# Patient Record
Sex: Female | Born: 1935
Health system: Southern US, Community
[De-identification: ages and names within clinical notes are randomized; demographics above are authoritative.]

## PROBLEM LIST (undated history)

## (undated) DIAGNOSIS — C55 Malignant neoplasm of uterus, part unspecified: Secondary | ICD-10-CM

## (undated) DIAGNOSIS — K117 Disturbances of salivary secretion: Secondary | ICD-10-CM

## (undated) DIAGNOSIS — N390 Urinary tract infection, site not specified: Secondary | ICD-10-CM

## (undated) DIAGNOSIS — Z8619 Personal history of other infectious and parasitic diseases: Secondary | ICD-10-CM

## (undated) DIAGNOSIS — I452 Bifascicular block: Secondary | ICD-10-CM

## (undated) DIAGNOSIS — F09 Unspecified mental disorder due to known physiological condition: Secondary | ICD-10-CM

## (undated) DIAGNOSIS — D696 Thrombocytopenia, unspecified: Secondary | ICD-10-CM

## (undated) DIAGNOSIS — R946 Abnormal results of thyroid function studies: Secondary | ICD-10-CM

## (undated) DIAGNOSIS — R682 Dry mouth, unspecified: Secondary | ICD-10-CM

## (undated) DIAGNOSIS — E875 Hyperkalemia: Secondary | ICD-10-CM

## (undated) DIAGNOSIS — E871 Hypo-osmolality and hyponatremia: Secondary | ICD-10-CM

## (undated) DIAGNOSIS — I2510 Atherosclerotic heart disease of native coronary artery without angina pectoris: Secondary | ICD-10-CM

## (undated) DIAGNOSIS — I10 Essential (primary) hypertension: Secondary | ICD-10-CM

## (undated) DIAGNOSIS — H4900 Third [oculomotor] nerve palsy, unspecified eye: Secondary | ICD-10-CM

## (undated) DIAGNOSIS — F0390 Unspecified dementia without behavioral disturbance: Secondary | ICD-10-CM

## (undated) DIAGNOSIS — K219 Gastro-esophageal reflux disease without esophagitis: Secondary | ICD-10-CM

## (undated) DIAGNOSIS — M1990 Unspecified osteoarthritis, unspecified site: Secondary | ICD-10-CM

## (undated) DIAGNOSIS — J449 Chronic obstructive pulmonary disease, unspecified: Secondary | ICD-10-CM

## (undated) DIAGNOSIS — E785 Hyperlipidemia, unspecified: Secondary | ICD-10-CM

## (undated) DIAGNOSIS — N3946 Mixed incontinence: Secondary | ICD-10-CM

## (undated) DIAGNOSIS — D509 Iron deficiency anemia, unspecified: Secondary | ICD-10-CM

## (undated) DIAGNOSIS — K5909 Other constipation: Secondary | ICD-10-CM

## (undated) HISTORY — DX: Third (oculomotor) nerve palsy, unspecified eye: H49.00

## (undated) HISTORY — PX: DILATION AND CURETTAGE OF UTERUS: SHX78

## (undated) HISTORY — DX: Urinary tract infection, site not specified: N39.0

## (undated) HISTORY — DX: Personal history of other infectious and parasitic diseases: Z86.19

## (undated) HISTORY — DX: Disturbances of salivary secretion: K11.7

## (undated) HISTORY — DX: Iron deficiency anemia, unspecified: D50.9

## (undated) HISTORY — DX: Chronic obstructive pulmonary disease, unspecified: J44.9

## (undated) HISTORY — DX: Thrombocytopenia, unspecified: D69.6

## (undated) HISTORY — DX: Unspecified dementia, unspecified severity, without behavioral disturbance, psychotic disturbance, mood disturbance, and anxiety: F03.90

## (undated) HISTORY — DX: Dry mouth, unspecified: R68.2

## (undated) HISTORY — PX: TRANSTHORACIC ECHOCARDIOGRAM: SHX275

## (undated) HISTORY — DX: Mixed incontinence: N39.46

## (undated) HISTORY — DX: Abnormal results of thyroid function studies: R94.6

## (undated) HISTORY — DX: Malignant neoplasm of uterus, part unspecified: C55

## (undated) HISTORY — PX: REPAIR KNEE LIGAMENT: SUR1188

## (undated) HISTORY — DX: Hyperkalemia: E87.5

## (undated) HISTORY — DX: Other constipation: K59.09

## (undated) HISTORY — DX: Unspecified mental disorder due to known physiological condition: F09

## (undated) HISTORY — PX: HERNIA REPAIR: SHX51

## (undated) HISTORY — PX: TONSILLECTOMY: SUR1361

## (undated) HISTORY — PX: CATARACT EXTRACTION W/ INTRAOCULAR LENS  IMPLANT, BILATERAL: SHX1307

## (undated) HISTORY — DX: Bifascicular block: I45.2

---

## 1989-02-03 HISTORY — PX: CATARACT EXTRACTION W/ INTRAOCULAR LENS  IMPLANT, BILATERAL: SHX1307

## 1995-06-06 HISTORY — PX: ESOPHAGOGASTRODUODENOSCOPY: SHX1529

## 1998-05-18 ENCOUNTER — Other Ambulatory Visit: Admission: RE | Admit: 1998-05-18 | Discharge: 1998-05-18 | Payer: Self-pay | Admitting: Obstetrics and Gynecology

## 1998-06-05 DIAGNOSIS — C55 Malignant neoplasm of uterus, part unspecified: Secondary | ICD-10-CM

## 1998-06-05 HISTORY — PX: APPENDECTOMY: SHX54

## 1998-06-05 HISTORY — PX: ABDOMINAL HYSTERECTOMY: SHX81

## 1998-06-05 HISTORY — DX: Malignant neoplasm of uterus, part unspecified: C55

## 1998-12-28 ENCOUNTER — Encounter (INDEPENDENT_AMBULATORY_CARE_PROVIDER_SITE_OTHER): Payer: Self-pay

## 1998-12-28 ENCOUNTER — Ambulatory Visit (HOSPITAL_COMMUNITY): Admission: RE | Admit: 1998-12-28 | Discharge: 1998-12-28 | Payer: Self-pay | Admitting: Internal Medicine

## 1999-01-14 ENCOUNTER — Encounter: Payer: Self-pay | Admitting: Orthopaedic Surgery

## 1999-01-18 ENCOUNTER — Inpatient Hospital Stay (HOSPITAL_COMMUNITY): Admission: RE | Admit: 1999-01-18 | Discharge: 1999-01-21 | Payer: Self-pay | Admitting: Gynecology

## 1999-01-18 ENCOUNTER — Encounter (INDEPENDENT_AMBULATORY_CARE_PROVIDER_SITE_OTHER): Payer: Self-pay

## 1999-05-05 ENCOUNTER — Ambulatory Visit: Admission: RE | Admit: 1999-05-05 | Discharge: 1999-05-05 | Payer: Self-pay | Admitting: Family Medicine

## 1999-06-20 ENCOUNTER — Other Ambulatory Visit: Admission: RE | Admit: 1999-06-20 | Discharge: 1999-06-20 | Payer: Self-pay | Admitting: Gynecology

## 1999-07-06 ENCOUNTER — Encounter: Admission: RE | Admit: 1999-07-06 | Discharge: 1999-08-11 | Payer: Self-pay | Admitting: Gynecology

## 1999-09-19 ENCOUNTER — Other Ambulatory Visit: Admission: RE | Admit: 1999-09-19 | Discharge: 1999-09-19 | Payer: Self-pay | Admitting: Gynecology

## 1999-11-21 ENCOUNTER — Other Ambulatory Visit: Admission: RE | Admit: 1999-11-21 | Discharge: 1999-11-21 | Payer: Self-pay | Admitting: Gynecology

## 2000-01-26 ENCOUNTER — Other Ambulatory Visit: Admission: RE | Admit: 2000-01-26 | Discharge: 2000-01-26 | Payer: Self-pay | Admitting: Orthopedic Surgery

## 2000-03-08 ENCOUNTER — Other Ambulatory Visit: Admission: RE | Admit: 2000-03-08 | Discharge: 2000-03-08 | Payer: Self-pay | Admitting: Obstetrics and Gynecology

## 2000-09-24 ENCOUNTER — Other Ambulatory Visit: Admission: RE | Admit: 2000-09-24 | Discharge: 2000-09-24 | Payer: Self-pay | Admitting: Obstetrics and Gynecology

## 2001-04-04 ENCOUNTER — Other Ambulatory Visit: Admission: RE | Admit: 2001-04-04 | Discharge: 2001-04-04 | Payer: Self-pay | Admitting: Obstetrics and Gynecology

## 2001-04-05 HISTORY — PX: COLONOSCOPY: SHX174

## 2001-04-11 ENCOUNTER — Ambulatory Visit (HOSPITAL_COMMUNITY): Admission: RE | Admit: 2001-04-11 | Discharge: 2001-04-11 | Payer: Self-pay | Admitting: Gastroenterology

## 2001-09-04 ENCOUNTER — Other Ambulatory Visit: Admission: RE | Admit: 2001-09-04 | Discharge: 2001-09-04 | Payer: Self-pay | Admitting: Gynecology

## 2001-09-26 ENCOUNTER — Ambulatory Visit (HOSPITAL_COMMUNITY): Admission: RE | Admit: 2001-09-26 | Discharge: 2001-09-26 | Payer: Self-pay | Admitting: Cardiology

## 2002-03-27 ENCOUNTER — Other Ambulatory Visit: Admission: RE | Admit: 2002-03-27 | Discharge: 2002-03-27 | Payer: Self-pay | Admitting: Gynecology

## 2002-10-02 ENCOUNTER — Other Ambulatory Visit: Admission: RE | Admit: 2002-10-02 | Discharge: 2002-10-02 | Payer: Self-pay | Admitting: Gynecology

## 2003-04-23 ENCOUNTER — Other Ambulatory Visit: Admission: RE | Admit: 2003-04-23 | Discharge: 2003-04-23 | Payer: Self-pay | Admitting: Gynecology

## 2003-11-12 ENCOUNTER — Other Ambulatory Visit: Admission: RE | Admit: 2003-11-12 | Discharge: 2003-11-12 | Payer: Self-pay | Admitting: Gynecology

## 2004-04-21 ENCOUNTER — Other Ambulatory Visit: Admission: RE | Admit: 2004-04-21 | Discharge: 2004-04-21 | Payer: Self-pay | Admitting: Gynecology

## 2005-05-04 ENCOUNTER — Other Ambulatory Visit: Admission: RE | Admit: 2005-05-04 | Discharge: 2005-05-04 | Payer: Self-pay | Admitting: Gynecology

## 2005-10-05 ENCOUNTER — Other Ambulatory Visit: Admission: RE | Admit: 2005-10-05 | Discharge: 2005-10-05 | Payer: Self-pay | Admitting: Gynecology

## 2005-11-16 ENCOUNTER — Ambulatory Visit (HOSPITAL_BASED_OUTPATIENT_CLINIC_OR_DEPARTMENT_OTHER): Admission: RE | Admit: 2005-11-16 | Discharge: 2005-11-16 | Payer: Self-pay | Admitting: Orthopedic Surgery

## 2005-12-26 ENCOUNTER — Encounter: Payer: Self-pay | Admitting: Vascular Surgery

## 2005-12-26 ENCOUNTER — Ambulatory Visit (HOSPITAL_COMMUNITY): Admission: RE | Admit: 2005-12-26 | Discharge: 2005-12-26 | Payer: Self-pay | Admitting: Orthopedic Surgery

## 2006-04-16 ENCOUNTER — Other Ambulatory Visit: Admission: RE | Admit: 2006-04-16 | Discharge: 2006-04-16 | Payer: Self-pay | Admitting: Gynecology

## 2006-10-11 ENCOUNTER — Other Ambulatory Visit: Admission: RE | Admit: 2006-10-11 | Discharge: 2006-10-11 | Payer: Self-pay | Admitting: Gynecology

## 2007-04-25 ENCOUNTER — Other Ambulatory Visit: Admission: RE | Admit: 2007-04-25 | Discharge: 2007-04-25 | Payer: Self-pay | Admitting: Gynecology

## 2007-06-22 DIAGNOSIS — I2510 Atherosclerotic heart disease of native coronary artery without angina pectoris: Secondary | ICD-10-CM

## 2008-02-12 ENCOUNTER — Inpatient Hospital Stay (HOSPITAL_COMMUNITY): Admission: AD | Admit: 2008-02-12 | Discharge: 2008-02-13 | Payer: Self-pay | Admitting: Cardiology

## 2010-06-26 ENCOUNTER — Encounter: Payer: Self-pay | Admitting: Family Medicine

## 2010-08-16 ENCOUNTER — Other Ambulatory Visit: Payer: Self-pay | Admitting: Gastroenterology

## 2010-08-19 ENCOUNTER — Ambulatory Visit
Admission: RE | Admit: 2010-08-19 | Discharge: 2010-08-19 | Disposition: A | Discharge status: Home / Self Care | Payer: Medicare Other | Source: Ambulatory Visit | Attending: Gastroenterology | Admitting: Gastroenterology

## 2010-10-18 NOTE — Cardiovascular Report (Signed)
[name], Sabrina Alvarez NO.:  [id number]   MEDICAL RECORD NO.:  [id number]          PATIENT TYPE:  INP   LOCATION:  2025                         FACILITY:  [facility]   PHYSICIAN:  Georga Hacking, M.D.DATE OF BIRTH:  [birth year]   DATE OF PROCEDURE:  02/13/2008  DATE OF DISCHARGE:                            CARDIAC CATHETERIZATION   HISTORY:  This is a 77 year old female with known coronary artery  disease who presented with 2-3 days history of substernal chest  pressure.  She had a previous negative Cardiolite study in May, but has  had worsening angina recently or chest pain recently.   PROCEDURE:  Left heart catheterization with coronary angiograms and left  ventriculogram.   PROCEDURE IN DETAIL:  The patient was brought to the Catheterization  Laboratory and prepped and draped in the usual manner.  After Xylocaine  anesthesia, a 6-French sheath was placed in the right femoral  percutaneously.  Angiograms were made using 6-French catheters and a 30  mL ventriculogram was performed.  She was returned the holding area for  sheath removal.   HEMODYNAMIC DATA:  Aorta postcontrast 163/62.  LV postcontrast 163/10-  14.   ANGIOGRAPHIC DATA:  Left ventriculogram:  Performed in the 30-degree RAO  projection.  The aortic valve is normal.  The mitral valve is normal.  The left ventricle appears normal in size.  The estimated ejection  fraction is 65-70%.  Coronary arteries arise and distribute normally.  There is dense calcification noted in the left coronary system and some  calcification in the right coronary system diffusely.  The left main  coronary artery is mildly calcified and contains no obstructive disease.  Left anterior descending is heavily calcified.  There is a 70-80%  stenosis prior to the septal perforator.  A small diffusely diseased  first diagonal branch arises after the septal perforator and has a  severe 95-99% proximal stenosis.  The LAD is then  totally occluded but  fills by a well-developed system of collaterals from the right coronary  artery.  The circumflex coronary artery has a 40-50% midvessel stenosis  noted.  A distal branch has a 95% stenosis and is somewhat small.  The  right coronary artery is calcified with a segmental 40-50% proximal  stenosis which is calcified.  The posterior descending artery supplies  almost a direct collateral to the LAD and collaterals to the LAD also  same through the septal perforators.  Posterolateral branches are free  of disease.   IMPRESSION:  1. Significant coronary artery disease with occluded left anterior      descending with well-developed collaterals in the right coronary      artery.  2. Moderate disease involving the circumflex marginal and right      coronary artery system.   RECOMMENDATIONS:  Potential ischemic sites could be in a small diagonal  branch which is too small for angioplasty and a small distal branch of  the circumflex which is also too small for intervention.  Recommended  continued medical therapy at this time.      Georga Hacking, M.D.  Electronically Signed  WST/MEDQ  D:  02/13/2008  T:  02/13/2008  Job:  [id number]   cc:   Molly Maduro L. Foy [place], M.D.

## 2010-10-21 NOTE — Op Note (Signed)
Sabrina Alvarez, Sabrina Alvarez NO.:  [id number]   MEDICAL RECORD NO.:  [id number]          PATIENT TYPE:  AMB   LOCATION:  DSC                          FACILITY:  [facility]   PHYSICIAN:  Loreta Ave, M.D. DATE OF BIRTH:  1928-04-29   DATE OF PROCEDURE:  11/16/2005  DATE OF DISCHARGE:                                 OPERATIVE REPORT   PREOPERATIVE DIAGNOSIS:  Degenerative arthritis, medial meniscus tear left  knee.   POSTOPERATIVE DIAGNOSIS:  Medial and lateral meniscus tear left knee with  grade 3 chondromalacia medial compartment.   PROCEDURE:  1.  Left knee exam under anesthesia.  2.  Arthroscopy.  3.  Debridement medial and lateral meniscus.  4.  Excision medial plica.  5.  Chondroplasty medial femoral condyle.   SURGEON:  Loreta Ave, M.D.   ASSISTANT:  Genene Churn. Denton Meek.   ANESTHESIA:  Knee block with sedation.   SPECIMENS:  None.   CULTURES:  None.   COMPLICATIONS:  None.   DRESSINGS:  Soft compressive.   PROCEDURE:  Patient brought to the operating room and after adequate  anesthesia had been obtained tourniquet and leg holder applied.  Prepped and  draped in the usual sterile fashion.  Three portals created, one  superolateral, one each medial and lateral parapatella.  Inflow catheter  induced in the knee, a standard arthroscope introduced, the knee inspected.  Good patellofemoral tracking, typical grade 2 wear for her age.  Large  fibrotic medial plica excised.  Diffuse grade 3 changes medial femoral  condyle __________ debrided.  No full thickness.  Tibial plateau looked  good.  Extensive tearing the entire posterior half medial meniscus and the  anterior half lateral meniscus.  Both were saucerized out to a stable rim,  tapered down smoothly, salvaging the front of the medial meniscus at  completion and the back of the lateral meniscus.  Lateral compartment  otherwise looked good.  Cruciate ligament was intact.  At completion, all  recesses were examined to be sure all loose fragments were removed.  Instruments and fluid  removed.  Portals in the knee were injected with Marcaine.  The portal was  closed with #4-0 nylon.  A sterile compressive dressing applied.  Anesthesia  reversed.  Brought to recovery room.  Tolerated the surgery well, no  complications.      Loreta Ave, M.D.  Electronically Signed     DFM/MEDQ  D:  11/16/2005  T:  11/16/2005  Job:  [id number]

## 2010-10-21 NOTE — Cardiovascular Report (Signed)
[place]  Patient:    Sabrina Alvarez, Sabrina Alvarez Visit Number: [id number] MRN: [id number]          Service Type: CAT Location: [facility] [id number] Attending Physician:  Norman Clay Dictated by:   Darden Palmer., M.D. Proc. Date: 09/26/01 Admit Date:  09/26/2001   CC:         Marinda Elk, M.D.   Cardiac Catheterization  INDICATIONS:  A 77 year old female with hypertension, hyperlipidemia, some recent chest pain with new anterior T-wave changes on ECG.  COMMENTS ABOUT PROCEDURE:  The patient tolerated the procedure well without complications.  The left coronary arteries were selected using a left 6-French 3.5 catheter.  She tolerated the procedure well without complications.  Please see the attached catheterization log for the remainder of the details.  She had good hemostasis at the end of the procedure.  PROCEDURE: 1. Left Heart Catheterization/Coronary Arteriography. 2. Left Ventriculogram.  HEMODYNAMIC REPORT: 1. AORTA:  (Post contrast) 162/62. 2. LV:  (Post contrasts) 162/17.  ANGIOGRAPHIC DATA:  Coronary arteries arise and distribute normally.  There is some calcification present in the left and right coronary systems.  1. LEFT MAIN CORONARY ARTERY:  Appears normal. 2. LEFT ANTERIOR DESCENDING ARTERY:  Has moderate proximal disease and is    then occluded after the septal perforator and diagonal.  The distal vessel    fills by well developed system of collaterals from the right coronary    artery, and some very faint collaterals from the left coronary artery.    The first diagonal branch is diffusely diseased, somewhat small in    caliber.  There are multiple areas of 90-95% stenosis proximally. 4. LEFT CIRCUMFLEX CORONARY ARTERY:  Contains a large marginal branch, with a    40-50% mid vessel stenosis noted. 5. RIGHT CORONARY ARTERY:  A large, dominant vessel.  Supplying a posterior    descending and a posterolateral  branch.  There is an eccentric 30-40%    proximal stenosis and diffuse irregularities elsewhere.  There is a    well developed system of collaterals to the distal left anterior    descending.  LEFT VENTRICULOGRAPHY:  Performed in the 30-degree RAO projection.  The aortic valve was normal.  The mitral valve is normal.  The left ventricle appears normal in size.  Wall motion is normal.  Estimated ejection fraction was 65-70%.  IMPRESSION: 1. Significant coronary artery disease with total occlusion of the LAD, with a    well developed system of collaterals from the right coronary artery.    Diffuse diagonal disease. 2. Mild to moderate disease involving the circumflex and the proximal    right coronary artery.  RECOMMENDATIONS:  Initially medical therapy and intensive risk factor modifications. Dictated by:   Darden Palmer., M.D. Attending Physician:  Norman Clay DD:  09/26/01 TD:  09/26/01 Job: [id number]

## 2010-10-21 NOTE — Procedures (Signed)
[facility]  Patient:    Sabrina Alvarez, Sabrina Alvarez Visit Number: [id number] MRN: [id number]          Service Type: END Location: ENDO Attending Physician:  Rich Brave Dictated by:   Florencia Reasons, M.D. Proc. Date: 04/11/01 Admit Date:  04/11/2001   CC:         Marinda Elk, M.D.  Esmeralda Arthur, M.D.   Procedure Report  PROCEDURE PERFORMED:  Colonoscopy.  INDICATION:  Screening for colon cancer in a 77 year old female.  FINDINGS:  Melanosis coli, otherwise normal.  DESCRIPTION OF PROCEDURE:  The nature, purpose and risks of the procedure have been discussed with the patient who provided written consent.  Sedation was Fentanyl 87.5 mcg and Versed 7 mg IV without arrhythmias or desaturation.  The Olympus standard pediatric video colonoscope was advanced with some looping around the colon to the cecum.  External abdominal compression and having the patient in the supine position helped to control the looping.  Pullback was then performed.  The quality of prep was excellent and it was felt that all areas were well seen.  The only abnormality on this exam was mild melanosis coli.  The patient indicates she does use Senokot.  No polyps, cancer, colitis, vascular malformations or diverticular disease were observed and retroflexion in the rectum was unremarkable.  No biopsies were obtained.  The patient tolerated the procedure well and there were no apparent complications.  IMPRESSION:  Normal screening colonoscopy to the cecum except for mild melanosis coli.  PLAN:  Screening sigmoidoscopic evaluation in five years.  At that time, hemoccult monitoring might also be instituted. Dictated by:   Florencia Reasons, M.D. Attending Physician:  Rich Brave DD:  04/11/01 TD:  04/12/01 Job: [id number]

## 2010-10-21 NOTE — Discharge Summary (Signed)
NAMEAMEIA, MORENCY NO.:  [id number]   MEDICAL RECORD NO.:  [id number]          PATIENT TYPE:  INP   LOCATION:  2025                         FACILITY:  [facility]   PHYSICIAN:  Georga Hacking, M.D.DATE OF BIRTH:  [birth year]   DATE OF ADMISSION:  02/12/2008  DATE OF DISCHARGE:  02/13/2008                               DISCHARGE SUMMARY   FINAL DIAGNOSES:  1. Prolonged chest pain of undetermined etiology - myocardial function      ruled out.  2. Coronary artery disease.  3. Sick sinus syndrome.  4. Hyperlipidemia.  5. Hypertension.  6. Right bundle-branch block and left anterior fascicular block.   PROCEDURE:  Cardiac catheterization.   HISTORY:  This 77 year old female was admitted to the hospital for  evaluation of unstable angina.  She has a history of hypertension,  hyperlipidemia, and has had an abnormal EKG for several years with a  right bundle-branch block and left anterior hemiblock.  In 2003 at her  period of intense stress, she developed a left upper chest burning pain  and anterolateral T-wave inversions.  Catheterization at that time  showed a totally occluded LAD with moderate disease involving the  circumflex and mild disease involving the right coronary artery.  She  has been treated medically and has done well over the years.  She  presented to the office the day of admission with vague midsternal  burning pain with radiation to the left arm.  The symptoms would occur  at rest and occasionally with activities.  She wondered whether or not  it was indigestion.  She was feeling well until 2-3 days prior to  admission with the symptoms.  EKG was unchanged.  A Cardiolite stress  test done in May showed significant EKG abnormalities, but no evidence  of ischemia.  This is the first chest discomfort that she had had in  some time.  She had recently quit taking simvastatin because she was  concerned it was affecting her memory.  Please see the  previously  dictated history and physical for remainder of the details.   HOSPITAL COURSE:  The patient was admitted to the hospital and was  placed on the anticoagulation with Lovenox.  Laboratory data done on  admission showed a hemoglobin of 13.2, hematocrit of 39.2.  Sodium is  133, potassium 4.2, chloride 99, CO2 28, glucose 105, BUN 12, creatinine  0.59.  Chemistry panel was normal.  Serial CPK and troponins were  normal.  Lipid panel showed a cholesterol of 200, triglycerides of 42,  LDL of 130, and HDL of 62.  TSH was 1.063.  The patient underwent  cardiac catheterization the next morning showing a calcified left main.  LAD had an 80% stenosis prior to the septal perforator and was occluded  following the septal perforator with collateral filling.  The first  diagonal branch was small with a 95-99% stenosis at its origin.  Circumflex had a 40% midvessel stenosis.  Right coronary artery was  calcified with a segmental 40-50% proximal stenosis, but it had  progressed to some since the previous catheterization.  It was  recommended that she have medical treatment continued.  She was observed  over the night and noted to be somewhat bradycardic but was  asymptomatic.  Plans were for medical treatment.  She was discharged  home the next day.   Her discharge medications include:  1. Lisinopril 20 mg daily.  2. Nitroglycerin 0.4 mg as needed.  3. Amlodipine 5 mg daily.  4. Vitamin D 400 International Units 2000 mg daily.  5. Fibro-Lax daily.  6. Atenolol 25 mg daily, which is a reduced dose.  7. Mucinex as needed.  8. Aspirin 81 mg daily.  9. Calcium 600 mg daily.  10.Fish oil 1 capsule 3 times a day.  11.She is also to take Pepcid AC 20 mg at bedtime.   She is instructed to call for an appointment for followup in 1-2 weeks  and is to report worsening symptoms.      Georga Hacking, M.D.  Electronically Signed     WST/MEDQ  D:  03/16/2008  T:  03/17/2008  Job:   [id number]   cc:   Molly Maduro L. Foy [place], M.D.

## 2011-03-08 LAB — COMPREHENSIVE METABOLIC PANEL
ALT: 19
AST: 23
Alkaline Phosphatase: 54
CO2: 28
Calcium: 9.6
GFR calc Af Amer: >60
GFR calc non Af Amer: >60
Glucose, Bld: 105 — ABNORMAL HIGH
Potassium: 4.2
Sodium: 133 — ABNORMAL LOW
Total Protein: 6.8

## 2011-03-08 LAB — DIFFERENTIAL
Basophils Absolute: 0
Eosinophils Absolute: 0.1
Eosinophils Relative: 2
Lymphocytes Relative: 26
Neutrophils Relative %: 66

## 2011-03-08 LAB — CARDIAC PANEL(CRET KIN+CKTOT+MB+TROPI)
CK, MB: 1.3
Relative Index: INVALID
Relative Index: INVALID
Troponin I: 0.01

## 2011-03-08 LAB — PROTIME-INR: Prothrombin Time: 13.9

## 2011-03-08 LAB — TSH: TSH: 1.063

## 2011-03-08 LAB — LIPID PANEL
Cholesterol: 200
Triglycerides: 42

## 2011-03-08 LAB — CBC
HCT: 39.2
Platelets: 136 — ABNORMAL LOW
RDW: 13.4
WBC: 5.4

## 2011-05-25 ENCOUNTER — Other Ambulatory Visit: Payer: Self-pay | Admitting: Gynecology

## 2011-06-07 DIAGNOSIS — Z1211 Encounter for screening for malignant neoplasm of colon: Secondary | ICD-10-CM | POA: Diagnosis not present

## 2011-07-13 DIAGNOSIS — Z1331 Encounter for screening for depression: Secondary | ICD-10-CM | POA: Diagnosis not present

## 2011-07-13 DIAGNOSIS — J00 Acute nasopharyngitis [common cold]: Secondary | ICD-10-CM | POA: Diagnosis not present

## 2011-07-13 DIAGNOSIS — I10 Essential (primary) hypertension: Secondary | ICD-10-CM | POA: Diagnosis not present

## 2011-07-13 DIAGNOSIS — E871 Hypo-osmolality and hyponatremia: Secondary | ICD-10-CM | POA: Diagnosis not present

## 2011-08-02 DIAGNOSIS — N3946 Mixed incontinence: Secondary | ICD-10-CM | POA: Diagnosis not present

## 2011-08-02 DIAGNOSIS — N3020 Other chronic cystitis without hematuria: Secondary | ICD-10-CM | POA: Diagnosis not present

## 2011-08-15 DIAGNOSIS — E871 Hypo-osmolality and hyponatremia: Secondary | ICD-10-CM | POA: Diagnosis not present

## 2011-08-16 DIAGNOSIS — E871 Hypo-osmolality and hyponatremia: Secondary | ICD-10-CM | POA: Diagnosis not present

## 2011-09-19 DIAGNOSIS — I452 Bifascicular block: Secondary | ICD-10-CM | POA: Diagnosis not present

## 2011-09-19 DIAGNOSIS — E785 Hyperlipidemia, unspecified: Secondary | ICD-10-CM | POA: Diagnosis not present

## 2011-09-19 DIAGNOSIS — I2510 Atherosclerotic heart disease of native coronary artery without angina pectoris: Secondary | ICD-10-CM | POA: Diagnosis not present

## 2011-09-19 DIAGNOSIS — I10 Essential (primary) hypertension: Secondary | ICD-10-CM | POA: Diagnosis not present

## 2011-09-25 DIAGNOSIS — R1310 Dysphagia, unspecified: Secondary | ICD-10-CM | POA: Diagnosis not present

## 2011-09-25 DIAGNOSIS — E871 Hypo-osmolality and hyponatremia: Secondary | ICD-10-CM | POA: Diagnosis not present

## 2011-10-03 ENCOUNTER — Encounter (HOSPITAL_COMMUNITY): Payer: Self-pay | Admitting: Cardiology

## 2011-10-03 ENCOUNTER — Inpatient Hospital Stay (HOSPITAL_COMMUNITY)
Admission: AD | Admit: 2011-10-03 | Discharge: 2011-10-04 | DRG: 287 | Disposition: A | Discharge status: Home / Self Care | Payer: Medicare Other | Source: Ambulatory Visit | Attending: Cardiology | Admitting: Cardiology

## 2011-10-03 ENCOUNTER — Other Ambulatory Visit: Payer: Self-pay | Admitting: Cardiology

## 2011-10-03 ENCOUNTER — Inpatient Hospital Stay (HOSPITAL_COMMUNITY): Payer: Medicare Other

## 2011-10-03 DIAGNOSIS — D696 Thrombocytopenia, unspecified: Secondary | ICD-10-CM | POA: Diagnosis present

## 2011-10-03 DIAGNOSIS — I200 Unstable angina: Secondary | ICD-10-CM | POA: Diagnosis not present

## 2011-10-03 DIAGNOSIS — E871 Hypo-osmolality and hyponatremia: Secondary | ICD-10-CM | POA: Diagnosis present

## 2011-10-03 DIAGNOSIS — I2510 Atherosclerotic heart disease of native coronary artery without angina pectoris: Principal | ICD-10-CM

## 2011-10-03 DIAGNOSIS — R079 Chest pain, unspecified: Secondary | ICD-10-CM | POA: Diagnosis not present

## 2011-10-03 DIAGNOSIS — R0789 Other chest pain: Secondary | ICD-10-CM | POA: Diagnosis not present

## 2011-10-03 DIAGNOSIS — E785 Hyperlipidemia, unspecified: Secondary | ICD-10-CM | POA: Diagnosis present

## 2011-10-03 DIAGNOSIS — I452 Bifascicular block: Secondary | ICD-10-CM | POA: Diagnosis present

## 2011-10-03 DIAGNOSIS — K219 Gastro-esophageal reflux disease without esophagitis: Secondary | ICD-10-CM | POA: Diagnosis present

## 2011-10-03 DIAGNOSIS — I119 Hypertensive heart disease without heart failure: Secondary | ICD-10-CM | POA: Diagnosis present

## 2011-10-03 DIAGNOSIS — Z8544 Personal history of malignant neoplasm of other female genital organs: Secondary | ICD-10-CM | POA: Insufficient documentation

## 2011-10-03 DIAGNOSIS — I10 Essential (primary) hypertension: Secondary | ICD-10-CM | POA: Insufficient documentation

## 2011-10-03 HISTORY — DX: Hyperlipidemia, unspecified: E78.5

## 2011-10-03 HISTORY — DX: Unspecified osteoarthritis, unspecified site: M19.90

## 2011-10-03 HISTORY — DX: Gastro-esophageal reflux disease without esophagitis: K21.9

## 2011-10-03 HISTORY — DX: Atherosclerotic heart disease of native coronary artery without angina pectoris: I25.10

## 2011-10-03 HISTORY — DX: Hypo-osmolality and hyponatremia: E87.1

## 2011-10-03 HISTORY — DX: Essential (primary) hypertension: I10

## 2011-10-03 LAB — CARDIAC PANEL(CRET KIN+CKTOT+MB+TROPI)
CK, MB: 3.3 ng/mL (ref 0.3–4.0)
Relative Index: 2.5 (ref 0.0–2.5)
Relative Index: 2.3 (ref 0.0–2.5)
Total CK: 132 U/L (ref 7–177)
Total CK: 125 U/L (ref 7–177)

## 2011-10-03 LAB — CBC
HCT: 37 % (ref 36.0–46.0)
Hemoglobin: 12.5 g/dL (ref 12.0–15.0)
MCHC: 33.8 g/dL (ref 30.0–36.0)
RDW: 13.5 % (ref 11.5–15.5)
WBC: 4.5 10*3/uL (ref 4.0–10.5)

## 2011-10-03 LAB — DIFFERENTIAL
Basophils Relative: 1 % (ref 0–1)
Eosinophils Absolute: 0.1 10*3/uL (ref 0.0–0.7)
Eosinophils Relative: 2 % (ref 0–5)
Lymphs Abs: 1.6 10*3/uL (ref 0.7–4.0)
Monocytes Absolute: 0.3 10*3/uL (ref 0.1–1.0)
Monocytes Relative: 6 % (ref 3–12)

## 2011-10-03 LAB — COMPREHENSIVE METABOLIC PANEL
ALT: 15 U/L (ref 0–35)
Albumin: 3.8 g/dL (ref 3.5–5.2)
Alkaline Phosphatase: 58 U/L (ref 39–117)
BUN: 16 mg/dL (ref 6–23)
Chloride: 101 mEq/L (ref 96–112)
Potassium: 4 mEq/L (ref 3.5–5.1)
Sodium: 138 mEq/L (ref 135–145)
Total Bilirubin: 0.3 mg/dL (ref 0.3–1.2)
Total Protein: 6.4 g/dL (ref 6.0–8.3)

## 2011-10-03 LAB — APTT: aPTT: 40 seconds — ABNORMAL HIGH (ref 24–37)

## 2011-10-03 MED ORDER — VITAMIN D3 25 MCG (1000 UT) PO CAPS: 1.0000 | ORAL_CAPSULE | Freq: Every day | ORAL | Status: DC

## 2011-10-03 MED ORDER — HEPARIN (PORCINE) IN NACL 100-0.45 UNIT/ML-% IJ SOLN
750.0000 [IU]/h | INTRAMUSCULAR | Status: DC
  Administered 2011-10-03: 750 [IU]/h via INTRAVENOUS
  Filled 2011-10-03: qty 250

## 2011-10-03 MED ORDER — COENZYME Q10 30 MG PO CAPS: 30.0000 mg | ORAL_CAPSULE | Freq: Every day | ORAL | Status: DC

## 2011-10-03 MED ORDER — OMEGA-3-ACID ETHYL ESTERS 1 G PO CAPS
2.0000 g | ORAL_CAPSULE | Freq: Two times a day (BID) | ORAL | Status: DC
  Administered 2011-10-04: 2 g via ORAL
  Filled 2011-10-03 (×2): qty 2

## 2011-10-03 MED ORDER — ATENOLOL 25 MG PO TABS
25.0000 mg | ORAL_TABLET | Freq: Every day | ORAL | Status: DC
  Filled 2011-10-03: qty 1

## 2011-10-03 MED ORDER — MAGNESIUM HYDROXIDE NICU ORAL SYRINGE 400 MG/5 ML
30.0000 mL | Freq: Every day | ORAL | Status: DC | PRN
  Filled 2011-10-03: qty 30

## 2011-10-03 MED ORDER — GLUCOSAMINE-CHONDROITIN 500-400 MG PO TABS: 1.0000 | ORAL_TABLET | Freq: Every day | ORAL | Status: DC

## 2011-10-03 MED ORDER — ASPIRIN EC 81 MG PO TBEC
81.0000 mg | DELAYED_RELEASE_TABLET | Freq: Every day | ORAL | Status: DC
  Filled 2011-10-03: qty 1

## 2011-10-03 MED ORDER — ATENOLOL 50 MG PO TABS
50.0000 mg | ORAL_TABLET | Freq: Every day | ORAL | Status: DC
  Filled 2011-10-03: qty 1

## 2011-10-03 MED ORDER — NITROGLYCERIN 0.4 MG SL SUBL: 0.4000 mg | SUBLINGUAL_TABLET | SUBLINGUAL | Status: DC | PRN

## 2011-10-03 MED ORDER — CALCIUM-VITAMIN D 250-125 MG-UNIT PO TABS: 1.0000 | ORAL_TABLET | Freq: Every day | ORAL | Status: DC

## 2011-10-03 MED ORDER — VITAMIN D3 25 MCG (1000 UNIT) PO TABS
1000.0000 [IU] | ORAL_TABLET | Freq: Every day | ORAL | Status: DC
Start: 2011-10-04 — End: 2011-10-04
  Administered 2011-10-04: 1000 [IU] via ORAL
  Filled 2011-10-03: qty 1

## 2011-10-03 MED ORDER — AMLODIPINE BESYLATE 5 MG PO TABS
5.0000 mg | ORAL_TABLET | Freq: Two times a day (BID) | ORAL | Status: DC
  Administered 2011-10-04: 5 mg via ORAL
  Filled 2011-10-03 (×5): qty 1

## 2011-10-03 MED ORDER — MAGNESIUM HYDROXIDE 400 MG/5ML PO SUSP
30.0000 mL | Freq: Every day | ORAL | Status: DC | PRN
  Administered 2011-10-03 – 2011-10-04 (×2): 30 mL via ORAL
  Filled 2011-10-03 (×2): qty 30

## 2011-10-03 MED ORDER — SODIUM CHLORIDE 0.9 % IV SOLN
INTRAVENOUS | Status: DC
  Administered 2011-10-03: via INTRAVENOUS

## 2011-10-03 MED ORDER — HEPARIN BOLUS VIA INFUSION
3000.0000 [IU] | Freq: Once | INTRAVENOUS | Status: AC
  Administered 2011-10-03: 3000 [IU] via INTRAVENOUS
  Filled 2011-10-03: qty 3000

## 2011-10-03 MED ORDER — PANTOPRAZOLE SODIUM 40 MG PO TBEC
40.0000 mg | DELAYED_RELEASE_TABLET | Freq: Every day | ORAL | Status: DC
  Administered 2011-10-04: 40 mg via ORAL
  Filled 2011-10-03: qty 1

## 2011-10-03 MED ORDER — CEPHALEXIN 250 MG PO CAPS
250.0000 mg | ORAL_CAPSULE | Freq: Every day | ORAL | Status: DC
  Administered 2011-10-04: 250 mg via ORAL
  Filled 2011-10-03: qty 1

## 2011-10-03 MED ORDER — CALCIUM CARBONATE-VITAMIN D 500-200 MG-UNIT PO TABS
0.5000 | ORAL_TABLET | Freq: Every day | ORAL | Status: DC
  Administered 2011-10-04: 0.5 via ORAL
  Filled 2011-10-03: qty 0.5

## 2011-10-03 MED ORDER — SIMVASTATIN 20 MG PO TABS
20.0000 mg | ORAL_TABLET | Freq: Every day | ORAL | Status: DC
  Administered 2011-10-03: 20 mg via ORAL
  Filled 2011-10-03 (×2): qty 1

## 2011-10-03 MED ORDER — LOSARTAN POTASSIUM 25 MG PO TABS
25.0000 mg | ORAL_TABLET | Freq: Every day | ORAL | Status: DC
  Administered 2011-10-04: 25 mg via ORAL
  Filled 2011-10-03: qty 1

## 2011-10-03 MED ORDER — DIAZEPAM 5 MG PO TABS
5.0000 mg | ORAL_TABLET | ORAL | Status: AC
  Administered 2011-10-04: 5 mg via ORAL
  Filled 2011-10-03: qty 1

## 2011-10-03 MED ORDER — ACETAMINOPHEN 325 MG PO TABS: 650.0000 mg | ORAL_TABLET | ORAL | Status: DC | PRN

## 2011-10-03 MED ORDER — AMLODIPINE BESYLATE 5 MG PO TABS
5.0000 mg | ORAL_TABLET | Freq: Every day | ORAL | Status: DC
  Filled 2011-10-03: qty 1

## 2011-10-03 NOTE — Progress Notes (Signed)
ANTICOAGULATION CONSULT NOTE - Initial Consult  Pharmacy Consult for heparin Indication: chest pain / unstable angina  Allergies  Allergen Reactions  . Macrobid (Nitrofurantoin Macrocrystal)     Patient Measurements: Height: 5\' 6"  (167.6 cm) Weight: 130 lb 8.2 oz (59.2 kg) IBW/kg (Calculated) : 59.3  Heparin Dosing Weight: 59.2kg  Vital Signs: Temp: 98.1 F (36.7 C) (04/30 1320) Temp src: Oral (04/30 1320) BP: 193/76 mmHg (04/30 1320) Pulse Rate: 82  (04/30 1320)  Labs: No results found for this basename: HGB:2,HCT:3,PLT:3,APTT:3,LABPROT:3,INR:3,HEPARINUNFRC:3,CREATININE:3,CKTOTAL:3,CKMB:3,TROPONINI:3 in the last 72 hours Estimated Creatinine Clearance: 50.7 ml/min (by C-G formula based on Cr of 0.59).  Medical History: Past Medical History  Diagnosis Date  . CAD (coronary artery disease)     Cath 2009  Occluded LAD, 40-50% RCA and circ managed medically   . Hypertension   . Hyperlipidemia   . GERD (gastroesophageal reflux disease)   . Hyponatremia 10/03/2011  . History of malignant neoplasm of vagina     Medications:  Scheduled:    . amLODipine  5 mg Oral Daily  . aspirin EC  81 mg Oral Daily  . atenolol  50 mg Oral Daily  . pantoprazole  40 mg Oral Q1200  . simvastatin  20 mg Oral q1800   Infusions:    Assessment: 78 yo female with chest pain / unstable angina will be started on heparin therapy.  Not on coumadin therapy at home.  Goal of Therapy:  8 hour heparin level 0.3-0.7   Plan:  1) Heparin 3000 units iv bolus x1, then start heparin drip at 750 units/hr (= 7.5 ml/hr) 2) Check an 8 hour heparin level after drip is started 3) Daily heparin level and CBC  Deandria Klute, Tsz-Yin 10/03/2011,1:52 PM

## 2011-10-03 NOTE — Progress Notes (Signed)
Patient labs reviewed.  Still running elevated BP.   WIll increase amlodipine to 10 mg/day in divided doses.  Darden Palmer MD [facility]

## 2011-10-03 NOTE — Progress Notes (Addendum)
Pts BP 170/90 manually; pt states that she took all of her BP medications today; HR SB high 40s; pt without any complaints; Dr.Tilley made aware, no new orders received; will continue to monitor

## 2011-10-03 NOTE — H&P (Signed)
Sabrina Alvarez  Date of visit:  10/03/2011 DOB:  [birth year]    Age:  78 yrs. Medical record number:  [id number]     Account number:  [id number] Primary Care Provider: Marinda Elk L ____________________________ CURRENT DIAGNOSES  1. Angina unstable  2. CAD,Native  3. Hypertension-Essential (Benign)  4. Hyperlipidemia  5. Conduction Disorder-RBBB w/LAFB  6. Chest pain, other ____________________________ ALLERGIES  Macrobid ____________________________ MEDICATIONS  1. Calcium 600 600 mg (1,500 mg) tablet, Daily  2. amlodipine 5 mg tablet, 1 p.o. daily  3. Vitamin D3 400 unit capsule, 2000mg  qd  4. atenolol 50 mg tablet, 1/2 tab daily  5. Cranberry 450 mg Tablet, 1 p.o. daily  6. pantoprazole 40 mg Tablet, Delayed Release (E.Alvarez.), 1 p.o. daily  7. simvastatin 40 mg Tablet, 1 p.o. daily  8. aspirin, buffered 81 mg Tablet, 1 p.o. daily  9. nitroglycerin 0.4 mg Tablet, Sublingual, PRN  10. Fish Oil 1,000 mg Capsule, TID  11. Glucosamine 500 mg tablet, 1 p.o. daily  12. coQ10 (ubiquinol) 200 mg capsule, 400 mg qd  13. Ibuprofen IB 200 mg tablet, 2 p.o. daily  14. biotin 2,500 mcg tablet, 2 qd  15. cephalexin 250 mg tablet, 1 p.o. daily  16. losartan 25 mg tablet, 1 p.o. daily  17. Benadryl Allergy 25 mg tablet, 1 p.o. daily ____________________________ CHIEF COMPLAINTS  Chest pain ____________________________ HISTORY OF PRESENT ILLNESS  This 78 year old female has a significant family history of heart disease and has a previous history of hypertension hyperlipidemia and known coronary disease. Her last catheterization was in 2009. I just saw her 2 weeks at which time she was feeling fine. She recently went to see her family physician with complaints of medicine interactions and her protonix she was reduced to every other day and she was switched to Pepcid. In addition because of a concern that lisinopril is causing hyponatremia this was discontinued. Yesterday after working out in  the yard she had left-sided and midsternal pain feeling like pins and needles described as pressure type pain and came back into the house. The symptoms lasted on and off but abated by the time she went to bed. She had recurrent symptoms this morning similar to this occurring at rest and was brought here by her son today. This symptoms occurred while she was coming over here her but abated after she arrived here. An EKG showed first-degree AV block but no ischemic ST change. She is admitted at this time for treatment of unstable angina.  ____________________________ PAST HISTORY  Past Medical Illnesses:  hyperlipidemia, hypertension, history of malignant polyp of vagina, GERD;  Cardiovascular Illnesses:  CAD;  Infectious Diseases:  childhood illnesses of mumps, measles and chickenpox;  Surgical Procedures:  D and Alvarez, hysterectomy, hernia repair, Alvarez Section;  Cardiology Procedures-Invasive:  cardiac cath (left) September 2009;  Cardiology Procedures-Noninvasive:  echocardiogram September 1996, treadmill cardiolite May 2009;  Cardiac Cath Results:  normal Left main, occluded LAD, 40% stenosis mid CFX, 50% stenosis mid CFX, 40-50 % stenosis mid RCA;  Peripheral Vascular Procedures:  abdominal U/S December 2008 no aneurysm;  LVEF of 70% documented via cardiac cath on 02/13/2008 ____________________________ CARDIO-PULMONARY TEST DATES EKG Date:  10/03/2011;   Cardiac Cath Date:  02/13/2008;  Nuclear Study Date:  10/30/2007;  Echocardiography Date: 02/15/1995;  Chest Xray Date: 10/29/2003;   ____________________________ SOCIAL HISTORY Alcohol Use:  does not use alcohol;  Smoking:  does not smoke;  Diet:  regular diet;  Lifestyle:  widowed and Husband died  suddenly of heart disease;  Exercise:  some exercise;  Occupation:  [employer] with husband;  Residence:  lives alone;   ____________________________ REVIEW OF SYSTEMS General:  denies recent weight change, fatique or change in exercise  tolerance.Integumentary:  no rashes or new skin lesions. Eyes:  cataract extraction bilaterally  Ears, Nose, Throat, Mouth:  partial hearing loss  Respiratory:  denies dyspnea, cough, wheezing or hemoptysis.Cardiovascular:  please review HPI Abdominal:  constipation  Genitourinary-Female:  stress incontinence, recent urinary tract infection  Musculoskeletal:  arthritis of the fingers Neurological: Questionable memory loss denies headaches, stroke, or TIA ____________________________ PHYSICAL EXAMINATION VITAL SIGNS  Blood Pressure:  130/70 Sitting, Right arm, regular cuff  , 142/74 Standing, Right arm and regular cuff   Pulse:  60/min. Weight:  131.00 lbs. Height:  66"BMI: 21  Constitutional:  pleasant white female, in no acute distress, looks stated age Skin:  scattered ecchymosis present Head:  normocephalic, normal hair pattern, no masses or tenderness Eyes:  EOMS Intact, PERRLA, Alvarez and S clear, Funduscopic exam not done. ENT:  ears, nose and throat reveal no gross abnormalities.  Dentition good. Neck:  supple, no masses, thyromegaly, JVD. Carotid pulses are full and equal bilaterally without bruits. Chest:  clear to auscultation and percussion Cardiac:  regular rhythm, S1 normal, S2 normal, No S3 or S4, no murmurs, gallops or rubs detected. Abdomen:  abdomen soft,non-tender, no masses, no hepatospenomegaly, or aneurysm noted Peripheral Pulses:  the femoral, popliteal, dorsalis pedis, and posterior tibial pulses are full and equal bilaterally with no bruits auscultated. 2+ Extremities & Back:  mild bilateral venous insufficiency changes present Neurological:  no gross motor or sensory deficits noted, affect appropriate, oriented x3. ____________________________ MOST RECENT LIPID PANEL 09/19/11  CHOL TOTL 156 mg/dl, LDL 79 calc, HDL 45 mg/dl, TRIGLYCER 161 mg/dl and CHOL/HDL 3.5 (Calc) ____________________________ IMPRESSIONS/PLAN  1. Chest discomfort consistent with unstable angina  pectoris 2. Three-vessel coronary artery disease with known occlusion of the LAD and moderate disease elsewhere 3. Hypertension 4. Hyponatremia 5. Hyperlipidemia  6. history of dysphagia  Recommendations:  She'll be brought into the hospital and have laboratory work done to exclude myocardial infarction. She will be placed on intravenous heparin continue her other medications and we will need to repeat her cardiac catheterization to be sure that she's not had interval progression of her coronary artery disease..The cardiac catheterization procedure was explained to the patient including risks of myocardial infarction, death, stroke, bleeding, arrhythmia, dye allergy, renal insufficiency. He understands and is willing to proceed. ____________________________ Christianne Dolin  1. 12 Lead EKG: Today                       ____________________________ Cardiology Physician:  Darden Palmer MD [facility]

## 2011-10-04 ENCOUNTER — Ambulatory Visit (HOSPITAL_COMMUNITY): Admission: RE | Admit: 2011-10-04 | Payer: Medicare Other | Source: Ambulatory Visit | Admitting: Cardiology

## 2011-10-04 ENCOUNTER — Other Ambulatory Visit: Payer: Self-pay

## 2011-10-04 ENCOUNTER — Encounter (HOSPITAL_COMMUNITY): Payer: Self-pay | Admitting: Cardiology

## 2011-10-04 ENCOUNTER — Encounter (HOSPITAL_COMMUNITY)
Admission: AD | Disposition: A | Discharge status: Home / Self Care | Payer: Self-pay | Source: Ambulatory Visit | Attending: Cardiology

## 2011-10-04 DIAGNOSIS — E871 Hypo-osmolality and hyponatremia: Secondary | ICD-10-CM | POA: Diagnosis not present

## 2011-10-04 DIAGNOSIS — I200 Unstable angina: Secondary | ICD-10-CM | POA: Diagnosis not present

## 2011-10-04 DIAGNOSIS — E785 Hyperlipidemia, unspecified: Secondary | ICD-10-CM | POA: Diagnosis not present

## 2011-10-04 DIAGNOSIS — I2510 Atherosclerotic heart disease of native coronary artery without angina pectoris: Secondary | ICD-10-CM | POA: Diagnosis not present

## 2011-10-04 HISTORY — PX: LEFT HEART CATHETERIZATION WITH CORONARY ANGIOGRAM: SHX5451

## 2011-10-04 LAB — CBC
HCT: 35.6 % — ABNORMAL LOW (ref 36.0–46.0)
Hemoglobin: 12.2 g/dL (ref 12.0–15.0)
MCV: 88.8 fL (ref 78.0–100.0)
RBC: 4.01 MIL/uL (ref 3.87–5.11)
RDW: 13.4 % (ref 11.5–15.5)
WBC: 4.4 10*3/uL (ref 4.0–10.5)

## 2011-10-04 LAB — POCT ACTIVATED CLOTTING TIME: Activated Clotting Time: 160 seconds

## 2011-10-04 LAB — CARDIAC PANEL(CRET KIN+CKTOT+MB+TROPI)
Relative Index: INVALID (ref 0.0–2.5)
Total CK: 97 U/L (ref 7–177)
Troponin I: <0.3 ng/mL (ref <0.30)

## 2011-10-04 LAB — HEPARIN LEVEL (UNFRACTIONATED): Heparin Unfractionated: 0.26 IU/mL — ABNORMAL LOW (ref 0.30–0.70)

## 2011-10-04 SURGERY — LEFT HEART CATHETERIZATION WITH CORONARY ANGIOGRAM
Anesthesia: LOCAL

## 2011-10-04 MED ORDER — NITROGLYCERIN 0.2 MG/ML ON CALL CATH LAB
INTRAVENOUS | Status: AC
  Filled 2011-10-04: qty 1

## 2011-10-04 MED ORDER — LOSARTAN POTASSIUM 25 MG PO TABS: 25.0000 mg | ORAL_TABLET | Freq: Two times a day (BID) | ORAL | Status: DC

## 2011-10-04 MED ORDER — PANTOPRAZOLE SODIUM 40 MG PO TBEC: 40.0000 mg | DELAYED_RELEASE_TABLET | Freq: Every day | ORAL | Status: DC

## 2011-10-04 MED ORDER — AMLODIPINE BESYLATE 5 MG PO TABS: 5.0000 mg | ORAL_TABLET | Freq: Two times a day (BID) | ORAL | Status: DC

## 2011-10-04 MED ORDER — SODIUM CHLORIDE 0.9 % IV SOLN
1.0000 mL/kg/h | INTRAVENOUS | Status: DC
  Administered 2011-10-04: 1 mL/kg/h via INTRAVENOUS

## 2011-10-04 MED ORDER — HEPARIN (PORCINE) IN NACL 2-0.9 UNIT/ML-% IJ SOLN
INTRAMUSCULAR | Status: AC
  Filled 2011-10-04: qty 1000

## 2011-10-04 MED ORDER — LIDOCAINE HCL (PF) 1 % IJ SOLN
INTRAMUSCULAR | Status: AC
  Filled 2011-10-04: qty 30

## 2011-10-04 MED ORDER — ASPIRIN 81 MG PO CHEW
324.0000 mg | CHEWABLE_TABLET | Freq: Once | ORAL | Status: AC
  Administered 2011-10-04: 324 mg via ORAL
  Filled 2011-10-04: qty 4

## 2011-10-04 NOTE — Interval H&P Note (Signed)
History and Physical Interval Note:  10/04/2011 8:30 AM  Sabrina Alvarez  has presented today for cardiac catheterization, with the diagnosis of chest pain  The various methods of treatment have been discussed with the patient and family. After consideration of risks, benefits and other options for treatment, the patient has consented to  Procedure: LEFT HEART CATHETERIZATION WITH CORONARY ANGIOGRAM..  The patients' history has been reviewed, patient examined, no change in status, stable for surgery.  I have reviewed the patients' chart and labs.  Questions were answered to the patient's satisfaction.     Darden Palmer MD [facility]

## 2011-10-04 NOTE — Progress Notes (Signed)
D/c orders received; IV removed with gauze on; pt remains in stable condition; pt meds and instructions reviewed and given to pt; care instructions for cath site reviewed as well; pt d/c to home

## 2011-10-04 NOTE — Progress Notes (Signed)
Nutrition Brief Note  RD pulled to patient due to problems chewing or swallowing food and/or liquids per admission nutrition screen. Patient states she's had trouble swallowing issues in the past associated with reflux. Reports a good appetite and recent weight stability. BMI = 21.1 kg/m2 (Normal). No nutrition intervention indicated at this time. Please consult RD as needed.  Alger Memos, RD Pager #: [phone]

## 2011-10-04 NOTE — Discharge Instructions (Signed)
1. Increase your amlodipine to 5 mg twice and day and the losartan to 25 mg twice a day 2. Go back to taking Protonix 40 mg once a day and stop the Pepcid ac. 3. Increase activity slowly   Heart Catheterization [facility]  A catheter was placed through the blood vessel in your groin, contrast was injected into the vessels, and pictures were taken.   You may feel some discomfort at the insertion site after the local anesthetic wears off. This discomfort should gradually improve over the next several days.  Only take over-the-counter or prescription medicines for pain, discomfort, or fever as directed by your caregiver.   Complications are very uncommon after this procedure.Call my office if you develop any of the following symptoms:   Worsening pain.   Bleeding.   Severe swelling at the puncture site.   Lightheadedness.   Dizziness or fainting.   Fever or chills.   If oozing, bleeding, or a lump appears at the puncture site, apply firm pressure directly to the site steadily for 15 minutes and go to the emergency department.   Keep the skin around the insertion site dry. You may take showers after 24 hours. If the area does get wet, dry the skin completely. Avoid baths until the skin puncture site heals, usually 5 to 7 days.   Rest  for the remainder of the day and avoid any heavy lifting (more than 10 pounds or 4.5 kg). Do not operate heavy machinery, drive, or make legal decisions for the first 24 hours after the procedure. Have a responsible person drive you home.   You may resume your usual diet after the procedure. Avoid alcoholic beverages for 24 hours after the procedure.

## 2011-10-04 NOTE — Progress Notes (Signed)
Pt walked after bedrest; cath site CDI, bandaid palced

## 2011-10-04 NOTE — Progress Notes (Signed)
Patient had a 2.82 second pause around 1:00 AM. Patient asymptomatic and resting. Patient stated she did not feel any differently than she has the rest of the night. BP 123/57, HR did decrease to the mid 30's, nonsustained, but primarily resting in the mid 50's. Dr. Mayford Knife notified. Will continue to monitor patient.

## 2011-10-04 NOTE — Progress Notes (Signed)
ANTICOAGULATION CONSULT NOTE - Follow Up Consult  Pharmacy Consult for heparin Indication: chest pain/ACS  Labs:  Basename 10/03/11 2229 10/03/11 2035 10/03/11 1428 10/03/11 1422  HGB -- -- -- 12.5  HCT -- -- -- 37.0  PLT -- -- -- 125*  APTT -- -- -- 40*  LABPROT -- -- -- 14.1  INR -- -- -- 1.07  HEPARINUNFRC 0.39 -- -- --  CREATININE -- -- -- 0.69  CKTOTAL -- 125 132 --  CKMB -- 2.9 3.3 --  TROPONINI -- <0.30 <0.30 --    Assessment/Plan: 78yo female therapeutic on heparin with initial dosing for CP/?USAP.  Will continue at current rate and confirm stable with am labs.   Colleen Can PharmD BCPS 10/04/2011,12:04 AM

## 2011-10-04 NOTE — Discharge Summary (Signed)
Physician Discharge Summary  Patient ID: Sabrina Alvarez MRN: [id number] DOB/AGE: 12-13-1927 78 y.o.  Admit date: 10/03/2011 Discharge date: 10/04/2011  Primary Physician: Dr. Marinda Elk  Primary Discharge Diagnosis: 1. Possible unstable angina pectoris  Secondary Discharge Diagnosis: 2. Coronary artery disease with occlusion of the LAD with collaterals from the right coronary artery, mild/moderate disease involving the right coronary artery and circumflex. Potential sites of ischemia or and a small obtuse marginal branch of the circumflex and the small first diagonal branch that are unchanged from 2009 3. Hypertensive heart disease 4. Hyperlipidemia under treatment 5. Mild thrombocytopenia 6. History of hiatal hernia, esophageal reflux and dysphagia  Procedures: Cardiac catheterization  Hospital Course: Patient admitted with new onset of chest discomfort. She has a known history of coronary artery disease first diagnosed in 2003. She has a known occlusion of her LAD and has been treated medically. She had recently had some medication changes where her protonix was reduced to every other day and her lisinopril had been reduced because of previous hyponatremia. She presented with chest discomfort that started when she was working out her yard that has some atypical features for it. She was seen in the office and was admitted.  She was treated initially with intravenous heparin and had negative serial cardiac enzymes. Because of her previous known coronary artery disease there was a high suspicion for unstable angina. She was taken to the catheterization laboratory the next warning and underwent cardiac catheterization. Left ventricular function was normal. The left anterior descending was occluded and there was mild/moderate disease involving the right coronary artery and circumflex that was unchanged from before. Potential sites of ischemia were identified in a small first marginal branch of  the circumflex as well as a small diagonal branch neither of which would be suitable for intervention. They also are unchanged from 2009. It was my thought that this likely was noncardiac in origin and that she should be treated medically.  Her blood pressure was elevated in the hospital and her amlodipine was increased and her protimes was also increased. Her blood pressure came under better control by the time of discharge. Her catheterization site was assessed and was fine following the catheterization she was discharged later on that day.  Discharge Exam: Blood pressure 140/71, pulse 53, temperature 98.2 F (36.8 C), temperature source Oral, resp. rate 20, height 5\' 6"  (1.676 m), weight 59.2 kg (130 lb 8.2 oz), SpO2 98.00%.   Cath site is clean and dry. Patient is alert and oriented  Labs: CBC:   Lab Results  Component Value Date   WBC 4.4 10/04/2011   HGB 12.2 10/04/2011   HCT 35.6* 10/04/2011   MCV 88.8 10/04/2011   PLT 110* 10/04/2011   CMP:  Lab 10/03/11 1422  NA 138  K 4.0  CL 101  CO2 28  BUN 16  CREATININE 0.69  CALCIUM 9.3  PROT 6.4  BILITOT 0.3  ALKPHOS 58  ALT 15  AST 24  GLUCOSE 90   Lipid Panel     Component Value Date/Time   CHOL  Value: 200        ATP III CLASSIFICATION:  <200     mg/dL   Desirable  272-536  mg/dL   Borderline High  >=644    mg/dL   High 0/08/4740 5956   TRIG 42 02/12/2008 1600   HDL 62 02/12/2008 1600   CHOLHDL 3.2 02/12/2008 1600   VLDL 8 02/12/2008 1600   LDLCALC  Value: 130  Total Cholesterol/HDL:CHD Risk Coronary Heart Disease Risk Table                     Men   Women  1/2 Average Risk   3.4   3.3* 02/12/2008 1600   Cardiac Enzymes:  Basename 10/04/11 0535 10/03/11 2035 10/03/11 1428  CKTOTAL 97 125 132  CKMB 2.4 2.9 3.3  CKMBINDEX -- -- --  TROPONINI <0.30 <0.30 <0.30    Radiology: Cardiomegaly, mild COPD  EKG: First degree AV block, sinus bradycardia, nonspecific ST changes  Discharge Medications:  Kanisha, [place]  Home  Medication Instructions [id number]   Printed on:10/04/11 1504  Medication Information                    atenolol (TENORMIN) 50 MG tablet Take 25 mg by mouth daily.           simvastatin (ZOCOR) 40 MG tablet Take 40 mg by mouth every evening.           cephALEXin (KEFLEX) 250 MG capsule Take 250 mg by mouth daily.           aspirin 81 MG chewable tablet Chew 81 mg by mouth daily.           ibuprofen (ADVIL,MOTRIN) 200 MG tablet Take 200 mg by mouth every 6 (six) hours as needed.           Cranberry-Vitamin C-Vitamin E (CRANBERRY PLUS VITAMIN C PO) Take 1 tablet by mouth daily.           glucosamine-chondroitin 500-400 MG tablet Take 1 tablet by mouth daily.           Cholecalciferol (VITAMIN D3) 1000 UNITS CAPS Take 1 capsule by mouth daily.           BIOTIN 5000 PO Take 1 capsule by mouth daily.           co-enzyme Q-10 30 MG capsule Take 30 mg by mouth daily. Hold while in hospital           calcium-vitamin D (OSCAL) 250-125 MG-UNIT per tablet Take 1 tablet by mouth daily.           omega-3 acid ethyl esters (LOVAZA) 1 G capsule Take 2 g by mouth 2 (two) times daily.           diphenhydrAMINE (BENADRYL) 25 MG tablet Take 25 mg by mouth every 6 (six) hours as needed. For allergy           amLODipine (NORVASC) 5 MG tablet Take 1 tablet (5 mg total) by mouth 2 (two) times daily.           pantoprazole (PROTONIX) 40 MG tablet Take 1 tablet (40 mg total) by mouth daily.           losartan (COZAAR) 25 MG tablet Take 1 tablet (25 mg total) by mouth 2 (two) times daily.             Followup plans and appointments: Followup with Dr. Donnie Aho in one week. Medication changes as noted above. She'll follow up with Dr. Foy Guadalajara for her regular appointment   Time spent with patient to include physician time: 35 minutes  Signed: W. Ashley Royalty MD [facility] Card[facility]  10/04/2011, 3:04 PM

## 2011-10-04 NOTE — Progress Notes (Signed)
   CARE MANAGEMENT NOTE 10/04/2011  Patient:  Sabrina Alvarez, Sabrina Alvarez   Account Number:  [id number]  Date Initiated:  10/04/2011  Documentation initiated by:  GRAVES-BIGELOW,Adithi Gammon  Subjective/Objective Assessment:   Pt admitted with cp- Sp LHC- revealed Three-vessel coronary artery disease with total occlusion of the LAD.  Plan for medical therapy at this time. Pt  is from home with son Sabrina Alvarez. Pt has no dme at home  and she states she ambulates well.     Action/Plan:   Anticipated DC Date:  10/06/2011   Anticipated DC Plan:  HOME/SELF CARE      DC Planning Services  CM consult      Choice offered to / List presented to:             Status of service:  In process, will continue to follow Medicare Important Message given?   (If response is "NO", the following Medicare IM given date fields will be blank) Date Medicare IM given:   Date Additional Medicare IM given:    Discharge Disposition:    Per UR Regulation:    If discussed at Long Length of Stay Meetings, dates discussed:    Comments:  10-04-11 1151 Tomi Bamberger, RN,BSN [phone] CM will continue to monitor for disposition needs if any.

## 2011-10-04 NOTE — CV Procedure (Signed)
Cardiac Catheterization Report   Sabrina Alvarez    78 y.o.  female  DOB: 1927/09/18  MRN: [id number]  10/04/2011   PROCEDURE:  Left heart catheterization with selective coronary angiography, left ventriculogram.  INDICATIONS: Unstable angina  The risks, benefits, and details of the procedure were explained to the patient.  The patient verbalized understanding and wanted to proceed.  Informed written consent was obtained.  PROCEDURE TECHNIQUE:  After Xylocaine anesthesia a 90F sheath was placed in the right femoral artery with a single anterior needle wall stick.   Left coronary angiography was done using a Judkins L4 guide catheter.  Right coronary angiography was done using a Judkins R4 guide catheter.  A 30 cc ventriculogram was performed in the 30 degree RAO projection.  Tolerated the procedure well.  Sheath removed in the holding area.   CONTRAST:  Total of 60 cc.  COMPLICATIONS:  None.    HEMODYNAMICS: Aorta postcontrast 170/65. LV postcontrast 170/114.  There was no gradient between the left ventricle and aorta.    ANGIOGRAPHIC DATA:    CORONARY ARTERIES:   Arise and distribute normally.  Right dominant. Significant coronary calcification is noted.  Left main coronary artery: Calcified but with no significant narrowing  Left anterior descending: Calcified proximally. A small first diagonal branch arises and has a severe 99% stenosis which is segmental. The vessel is then occluded and fills by well-developed to collaterals from the right coronary artery.  Circumflex coronary artery: Calcified proximally. Proximal 30-40% stenosis. Mid vessel 30-40% stenosis noted. A small marginal branch arises from within the stenosis and has a severe 99% stenosis proximally.  Right coronary artery: Dominant vessel, calcified throughout its length. Proximal 40% stenosis noted. The vessel supplies a large posterior descending and posterolateral branch and  supplies a well-developed system of collaterals through septal branches as well as the posterior descending to the LAD.  LEFT VENTRICULOGRAM:  Performed in the 30 RAO projection.  The aortic and mitral valves are normal. The left ventricle is normal in size with normal wall motion. Estimated ejection fraction is 60%.  IMPRESSIONS:  1. Three-vessel coronary artery disease with total occlusion of the LAD, mild/moderate stenosis involving the right coronary artery and circumflex. Potential sites of ischemia exist in a small first diagonal branch and a small marginal branch neither of which are suitable for percutaneous intervention. 2. Normal left ventricle.Marland Kitchen  RECOMMENDATION:    Continue medical therapy at this time. Because of bradycardia need to reduce beta blocker therapy and increase her other antihypertensive therapy.  Darden Palmer MD [facility]

## 2011-10-04 NOTE — H&P (View-Only) (Signed)
 Sabrina Alvarez, Sabrina Alvarez  Date of visit:  10/03/2011 DOB:  10/07/1927    Age:  78 yrs. Medical record number:  [id number]     Account number:  [id number] Primary Care Provider: FRIED, ROBERT L ____________________________ CURRENT DIAGNOSES  1. Angina unstable  2. CAD,Native  3. Hypertension-Essential (Benign)  4. Hyperlipidemia  5. Conduction Disorder-RBBB w/LAFB  6. Chest pain, other ____________________________ ALLERGIES  Macrobid ____________________________ MEDICATIONS  1. Calcium 600 600 mg (1,500 mg) tablet, Daily  2. amlodipine 5 mg tablet, 1 p.o. daily  3. Vitamin D3 400 unit capsule, 2000mg qd  4. atenolol 50 mg tablet, 1/2 tab daily  5. Cranberry 450 mg Tablet, 1 p.o. daily  6. pantoprazole 40 mg Tablet, Delayed Release (E.Alvarez.), 1 p.o. daily  7. simvastatin 40 mg Tablet, 1 p.o. daily  8. aspirin, buffered 81 mg Tablet, 1 p.o. daily  9. nitroglycerin 0.4 mg Tablet, Sublingual, PRN  10. Fish Oil 1,000 mg Capsule, TID  11. Glucosamine 500 mg tablet, 1 p.o. daily  12. coQ10 (ubiquinol) 200 mg capsule, 400 mg qd  13. Ibuprofen IB 200 mg tablet, 2 p.o. daily  14. biotin 2,500 mcg tablet, 2 qd  15. cephalexin 250 mg tablet, 1 p.o. daily  16. losartan 25 mg tablet, 1 p.o. daily  17. Benadryl Allergy 25 mg tablet, 1 p.o. daily ____________________________ CHIEF COMPLAINTS  Chest pain ____________________________ HISTORY OF PRESENT ILLNESS  This 78-year-old female has a significant family history of heart disease and has a previous history of hypertension hyperlipidemia and known coronary disease. Her last catheterization was in 2009. I just saw her 2 weeks at which time she was feeling fine. She recently went to see her family physician with complaints of medicine interactions and her protonix she was reduced to every other day and she was switched to Pepcid. In addition because of a concern that lisinopril is causing hyponatremia this was discontinued. Yesterday after working out in  the yard she had left-sided and midsternal pain feeling like pins and needles described as pressure type pain and came back into the house. The symptoms lasted on and off but abated by the time she went to bed. She had recurrent symptoms this morning similar to this occurring at rest and was brought here by her son today. This symptoms occurred while she was coming over here her but abated after she arrived here. An EKG showed first-degree AV block but no ischemic ST change. She is admitted at this time for treatment of unstable angina.  ____________________________ PAST HISTORY  Past Medical Illnesses:  hyperlipidemia, hypertension, history of malignant polyp of vagina, GERD;  Cardiovascular Illnesses:  CAD;  Infectious Diseases:  childhood illnesses of mumps, measles and chickenpox;  Surgical Procedures:  D and Alvarez, hysterectomy, hernia repair, Alvarez Section;  Cardiology Procedures-Invasive:  cardiac cath (left) September 2009;  Cardiology Procedures-Noninvasive:  echocardiogram September 1996, treadmill cardiolite May 2009;  Cardiac Cath Results:  normal Left main, occluded LAD, 40% stenosis mid CFX, 50% stenosis mid CFX, 40-50 % stenosis mid RCA;  Peripheral Vascular Procedures:  abdominal U/S December 2008 no aneurysm;  LVEF of 70% documented via cardiac cath on 02/13/2008 ____________________________ CARDIO-PULMONARY TEST DATES EKG Date:  10/03/2011;   Cardiac Cath Date:  02/13/2008;  Nuclear Study Date:  10/30/2007;  Echocardiography Date: 02/15/1995;  Chest Xray Date: 10/29/2003;   ____________________________ SOCIAL HISTORY Alcohol Use:  does not use alcohol;  Smoking:  does not smoke;  Diet:  regular diet;  Lifestyle:  widowed and Husband died   suddenly of heart disease;  Exercise:  some exercise;  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 Eyes:  cataract extraction bilaterally  Ears, Nose, Throat, Mouth:  partial hearing loss  Respiratory:  denies dyspnea, cough, wheezing or hemoptysis.Cardiovascular:  please review HPI Abdominal:  constipation  Genitourinary-Female:  stress incontinence, recent urinary tract infection  Musculoskeletal:  arthritis of the fingers Neurological: Questionable memory loss denies headaches, stroke, or TIA ____________________________ PHYSICAL EXAMINATION VITAL SIGNS  Blood Pressure:  130/70 Sitting, Right arm, regular cuff  , 142/74 Standing, Right arm and regular cuff   Pulse:  60/min. Weight:  131.00 lbs. Height:  66"BMI: 21  Constitutional:  pleasant white female, in no acute distress, looks stated age Skin:  scattered ecchymosis present Head:  normocephalic, normal hair pattern, no masses or tenderness Eyes:  EOMS Intact, PERRLA, Alvarez and S clear, Funduscopic exam not done. ENT:  ears, nose and throat reveal no gross abnormalities.  Dentition good. Neck:  supple, no masses, thyromegaly, JVD. Carotid pulses are full and equal bilaterally without bruits. Chest:  clear to auscultation and percussion Cardiac:  regular rhythm, S1 normal, S2 normal, No S3 or S4, no murmurs, gallops or rubs detected. Abdomen:  abdomen soft,non-tender, no masses, no hepatospenomegaly, or aneurysm noted Peripheral Pulses:  the femoral, popliteal, dorsalis pedis, and posterior tibial pulses are full and equal bilaterally with no bruits auscultated. 2+ Extremities & Back:  mild bilateral venous insufficiency changes present Neurological:  no gross motor or sensory deficits noted, affect appropriate, oriented x3. ____________________________ MOST RECENT LIPID PANEL 09/19/11  CHOL TOTL 156 mg/dl, LDL 79 calc, HDL 45 mg/dl, TRIGLYCER 164 mg/dl and CHOL/HDL 3.5 (Calc) ____________________________ IMPRESSIONS/PLAN  1. Chest discomfort consistent with unstable angina  pectoris 2. Three-vessel coronary artery disease with known occlusion of the LAD and moderate disease elsewhere 3. Hypertension 4. Hyponatremia 5. Hyperlipidemia  6. history of dysphagia  Recommendations:  She'll be brought into the hospital and have laboratory work done to exclude myocardial infarction. She will be placed on intravenous heparin continue her other medications and we will need to repeat her cardiac catheterization to be sure that she's not had interval progression of her coronary artery disease..The cardiac catheterization procedure was explained to the patient including risks of myocardial infarction, death, stroke, bleeding, arrhythmia, dye allergy, renal insufficiency. He understands and is willing to proceed. ____________________________ TODAYS ORDERS  1. 12 Lead EKG: Today                       ____________________________ Cardiology Physician:  W. Spencer Lenward Able, Jr. MD [facility]     

## 2011-10-11 DIAGNOSIS — I200 Unstable angina: Secondary | ICD-10-CM | POA: Diagnosis not present

## 2011-10-11 DIAGNOSIS — I2510 Atherosclerotic heart disease of native coronary artery without angina pectoris: Secondary | ICD-10-CM | POA: Diagnosis not present

## 2011-10-11 DIAGNOSIS — R0789 Other chest pain: Secondary | ICD-10-CM | POA: Diagnosis not present

## 2011-10-11 DIAGNOSIS — I10 Essential (primary) hypertension: Secondary | ICD-10-CM | POA: Diagnosis not present

## 2011-10-11 DIAGNOSIS — E785 Hyperlipidemia, unspecified: Secondary | ICD-10-CM | POA: Diagnosis not present

## 2011-10-11 DIAGNOSIS — I452 Bifascicular block: Secondary | ICD-10-CM | POA: Diagnosis not present

## 2011-10-16 DIAGNOSIS — E871 Hypo-osmolality and hyponatremia: Secondary | ICD-10-CM | POA: Diagnosis not present

## 2011-10-17 DIAGNOSIS — H26499 Other secondary cataract, unspecified eye: Secondary | ICD-10-CM | POA: Diagnosis not present

## 2011-11-06 DIAGNOSIS — I10 Essential (primary) hypertension: Secondary | ICD-10-CM | POA: Diagnosis not present

## 2011-11-30 DIAGNOSIS — R072 Precordial pain: Secondary | ICD-10-CM | POA: Diagnosis not present

## 2011-11-30 DIAGNOSIS — R1310 Dysphagia, unspecified: Secondary | ICD-10-CM | POA: Diagnosis not present

## 2011-11-30 DIAGNOSIS — K219 Gastro-esophageal reflux disease without esophagitis: Secondary | ICD-10-CM | POA: Diagnosis not present

## 2011-11-30 DIAGNOSIS — Z1211 Encounter for screening for malignant neoplasm of colon: Secondary | ICD-10-CM | POA: Diagnosis not present

## 2011-12-05 DIAGNOSIS — B373 Candidiasis of vulva and vagina: Secondary | ICD-10-CM | POA: Diagnosis not present

## 2012-02-01 DIAGNOSIS — R351 Nocturia: Secondary | ICD-10-CM | POA: Diagnosis not present

## 2012-02-01 DIAGNOSIS — N3020 Other chronic cystitis without hematuria: Secondary | ICD-10-CM | POA: Diagnosis not present

## 2012-02-06 DIAGNOSIS — I10 Essential (primary) hypertension: Secondary | ICD-10-CM | POA: Diagnosis not present

## 2012-02-06 DIAGNOSIS — E7800 Pure hypercholesterolemia, unspecified: Secondary | ICD-10-CM | POA: Diagnosis not present

## 2012-02-08 DIAGNOSIS — I10 Essential (primary) hypertension: Secondary | ICD-10-CM | POA: Diagnosis not present

## 2012-02-08 DIAGNOSIS — E7800 Pure hypercholesterolemia, unspecified: Secondary | ICD-10-CM | POA: Diagnosis not present

## 2012-02-12 DIAGNOSIS — S0003XA Contusion of scalp, initial encounter: Secondary | ICD-10-CM | POA: Diagnosis not present

## 2012-02-12 DIAGNOSIS — S0083XA Contusion of other part of head, initial encounter: Secondary | ICD-10-CM | POA: Diagnosis not present

## 2012-03-14 ENCOUNTER — Other Ambulatory Visit: Payer: Self-pay | Admitting: Gastroenterology

## 2012-03-14 DIAGNOSIS — R1013 Epigastric pain: Secondary | ICD-10-CM | POA: Diagnosis not present

## 2012-03-14 DIAGNOSIS — R1310 Dysphagia, unspecified: Secondary | ICD-10-CM | POA: Diagnosis not present

## 2012-03-14 DIAGNOSIS — R109 Unspecified abdominal pain: Secondary | ICD-10-CM

## 2012-03-14 DIAGNOSIS — K219 Gastro-esophageal reflux disease without esophagitis: Secondary | ICD-10-CM | POA: Diagnosis not present

## 2012-03-25 ENCOUNTER — Ambulatory Visit
Admission: RE | Admit: 2012-03-25 | Discharge: 2012-03-25 | Disposition: A | Discharge status: Home / Self Care | Payer: Medicare Other | Source: Ambulatory Visit | Attending: Gastroenterology | Admitting: Gastroenterology

## 2012-03-25 DIAGNOSIS — I700 Atherosclerosis of aorta: Secondary | ICD-10-CM | POA: Diagnosis not present

## 2012-03-25 DIAGNOSIS — R109 Unspecified abdominal pain: Secondary | ICD-10-CM

## 2012-03-25 DIAGNOSIS — Z23 Encounter for immunization: Secondary | ICD-10-CM | POA: Diagnosis not present

## 2012-03-26 DIAGNOSIS — R220 Localized swelling, mass and lump, head: Secondary | ICD-10-CM | POA: Diagnosis not present

## 2012-03-26 DIAGNOSIS — N318 Other neuromuscular dysfunction of bladder: Secondary | ICD-10-CM | POA: Diagnosis not present

## 2012-05-21 DIAGNOSIS — D2330 Other benign neoplasm of skin of unspecified part of face: Secondary | ICD-10-CM | POA: Diagnosis not present

## 2012-06-04 DIAGNOSIS — Z1231 Encounter for screening mammogram for malignant neoplasm of breast: Secondary | ICD-10-CM | POA: Diagnosis not present

## 2012-06-18 DIAGNOSIS — D2330 Other benign neoplasm of skin of unspecified part of face: Secondary | ICD-10-CM | POA: Diagnosis not present

## 2012-07-05 ENCOUNTER — Other Ambulatory Visit: Payer: Self-pay | Admitting: Gynecology

## 2012-07-05 DIAGNOSIS — R350 Frequency of micturition: Secondary | ICD-10-CM | POA: Diagnosis not present

## 2012-07-05 DIAGNOSIS — Z01419 Encounter for gynecological examination (general) (routine) without abnormal findings: Secondary | ICD-10-CM | POA: Diagnosis not present

## 2012-07-05 DIAGNOSIS — Z9189 Other specified personal risk factors, not elsewhere classified: Secondary | ICD-10-CM | POA: Diagnosis not present

## 2012-07-05 DIAGNOSIS — N8184 Pelvic muscle wasting: Secondary | ICD-10-CM | POA: Diagnosis not present

## 2012-07-23 DIAGNOSIS — D2330 Other benign neoplasm of skin of unspecified part of face: Secondary | ICD-10-CM | POA: Diagnosis not present

## 2012-08-05 DIAGNOSIS — I10 Essential (primary) hypertension: Secondary | ICD-10-CM | POA: Diagnosis not present

## 2012-08-05 DIAGNOSIS — L659 Nonscarring hair loss, unspecified: Secondary | ICD-10-CM | POA: Diagnosis not present

## 2012-08-05 DIAGNOSIS — E7800 Pure hypercholesterolemia, unspecified: Secondary | ICD-10-CM | POA: Diagnosis not present

## 2012-08-27 DIAGNOSIS — D2330 Other benign neoplasm of skin of unspecified part of face: Secondary | ICD-10-CM | POA: Diagnosis not present

## 2012-09-02 DIAGNOSIS — R7301 Impaired fasting glucose: Secondary | ICD-10-CM | POA: Diagnosis not present

## 2012-09-16 DIAGNOSIS — I10 Essential (primary) hypertension: Secondary | ICD-10-CM | POA: Diagnosis not present

## 2012-09-16 DIAGNOSIS — I2510 Atherosclerotic heart disease of native coronary artery without angina pectoris: Secondary | ICD-10-CM | POA: Diagnosis not present

## 2012-09-16 DIAGNOSIS — I452 Bifascicular block: Secondary | ICD-10-CM | POA: Diagnosis not present

## 2012-09-16 DIAGNOSIS — E785 Hyperlipidemia, unspecified: Secondary | ICD-10-CM | POA: Diagnosis not present

## 2012-10-01 DIAGNOSIS — D2330 Other benign neoplasm of skin of unspecified part of face: Secondary | ICD-10-CM | POA: Diagnosis not present

## 2012-10-14 DIAGNOSIS — Z961 Presence of intraocular lens: Secondary | ICD-10-CM | POA: Diagnosis not present

## 2012-12-19 DIAGNOSIS — N3020 Other chronic cystitis without hematuria: Secondary | ICD-10-CM | POA: Diagnosis not present

## 2012-12-19 DIAGNOSIS — R350 Frequency of micturition: Secondary | ICD-10-CM | POA: Diagnosis not present

## 2012-12-19 DIAGNOSIS — R351 Nocturia: Secondary | ICD-10-CM | POA: Diagnosis not present

## 2013-01-21 DIAGNOSIS — L819 Disorder of pigmentation, unspecified: Secondary | ICD-10-CM | POA: Diagnosis not present

## 2013-01-21 DIAGNOSIS — R238 Other skin changes: Secondary | ICD-10-CM | POA: Diagnosis not present

## 2013-01-21 DIAGNOSIS — R350 Frequency of micturition: Secondary | ICD-10-CM | POA: Diagnosis not present

## 2013-01-21 DIAGNOSIS — R3989 Other symptoms and signs involving the genitourinary system: Secondary | ICD-10-CM | POA: Diagnosis not present

## 2013-01-21 DIAGNOSIS — L659 Nonscarring hair loss, unspecified: Secondary | ICD-10-CM | POA: Diagnosis not present

## 2013-01-21 DIAGNOSIS — D485 Neoplasm of uncertain behavior of skin: Secondary | ICD-10-CM | POA: Diagnosis not present

## 2013-02-03 DIAGNOSIS — D696 Thrombocytopenia, unspecified: Secondary | ICD-10-CM

## 2013-02-03 HISTORY — DX: Thrombocytopenia, unspecified: D69.6

## 2013-02-06 DIAGNOSIS — R413 Other amnesia: Secondary | ICD-10-CM | POA: Diagnosis not present

## 2013-02-06 DIAGNOSIS — I10 Essential (primary) hypertension: Secondary | ICD-10-CM | POA: Diagnosis not present

## 2013-02-06 DIAGNOSIS — H6120 Impacted cerumen, unspecified ear: Secondary | ICD-10-CM | POA: Diagnosis not present

## 2013-02-06 DIAGNOSIS — E7800 Pure hypercholesterolemia, unspecified: Secondary | ICD-10-CM | POA: Diagnosis not present

## 2013-02-06 DIAGNOSIS — E7089 Other disorders of aromatic amino-acid metabolism: Secondary | ICD-10-CM | POA: Diagnosis not present

## 2013-02-06 DIAGNOSIS — L659 Nonscarring hair loss, unspecified: Secondary | ICD-10-CM | POA: Diagnosis not present

## 2013-03-14 DIAGNOSIS — R3989 Other symptoms and signs involving the genitourinary system: Secondary | ICD-10-CM | POA: Diagnosis not present

## 2013-03-14 DIAGNOSIS — N390 Urinary tract infection, site not specified: Secondary | ICD-10-CM | POA: Diagnosis not present

## 2013-03-26 DIAGNOSIS — L819 Disorder of pigmentation, unspecified: Secondary | ICD-10-CM | POA: Diagnosis not present

## 2013-03-27 DIAGNOSIS — Z23 Encounter for immunization: Secondary | ICD-10-CM | POA: Diagnosis not present

## 2013-04-30 DIAGNOSIS — L659 Nonscarring hair loss, unspecified: Secondary | ICD-10-CM | POA: Diagnosis not present

## 2013-04-30 DIAGNOSIS — L819 Disorder of pigmentation, unspecified: Secondary | ICD-10-CM | POA: Diagnosis not present

## 2013-05-05 DIAGNOSIS — H4900 Third [oculomotor] nerve palsy, unspecified eye: Secondary | ICD-10-CM

## 2013-05-05 HISTORY — DX: Third (oculomotor) nerve palsy, unspecified eye: H49.00

## 2013-05-05 HISTORY — PX: TRANSTHORACIC ECHOCARDIOGRAM: SHX275

## 2013-05-15 DIAGNOSIS — N3020 Other chronic cystitis without hematuria: Secondary | ICD-10-CM | POA: Diagnosis not present

## 2013-05-29 ENCOUNTER — Emergency Department (HOSPITAL_COMMUNITY): Payer: Medicare Other

## 2013-05-29 ENCOUNTER — Inpatient Hospital Stay (HOSPITAL_COMMUNITY): Payer: Medicare Other

## 2013-05-29 ENCOUNTER — Encounter (HOSPITAL_COMMUNITY): Payer: Self-pay | Admitting: Emergency Medicine

## 2013-05-29 ENCOUNTER — Inpatient Hospital Stay (HOSPITAL_COMMUNITY)
Admission: EM | Admit: 2013-05-29 | Discharge: 2013-05-31 | DRG: 123 | Disposition: A | Discharge status: Home Health | Payer: Medicare Other | Attending: Internal Medicine | Admitting: Internal Medicine

## 2013-05-29 DIAGNOSIS — Z8544 Personal history of malignant neoplasm of other female genital organs: Secondary | ICD-10-CM

## 2013-05-29 DIAGNOSIS — G8389 Other specified paralytic syndromes: Secondary | ICD-10-CM | POA: Diagnosis not present

## 2013-05-29 DIAGNOSIS — J449 Chronic obstructive pulmonary disease, unspecified: Secondary | ICD-10-CM | POA: Diagnosis not present

## 2013-05-29 DIAGNOSIS — R600 Localized edema: Secondary | ICD-10-CM

## 2013-05-29 DIAGNOSIS — I6350 Cerebral infarction due to unspecified occlusion or stenosis of unspecified cerebral artery: Secondary | ICD-10-CM

## 2013-05-29 DIAGNOSIS — I2510 Atherosclerotic heart disease of native coronary artery without angina pectoris: Secondary | ICD-10-CM | POA: Diagnosis not present

## 2013-05-29 DIAGNOSIS — H4900 Third [oculomotor] nerve palsy, unspecified eye: Secondary | ICD-10-CM | POA: Diagnosis not present

## 2013-05-29 DIAGNOSIS — E785 Hyperlipidemia, unspecified: Secondary | ICD-10-CM | POA: Diagnosis not present

## 2013-05-29 DIAGNOSIS — H532 Diplopia: Secondary | ICD-10-CM

## 2013-05-29 DIAGNOSIS — I6789 Other cerebrovascular disease: Secondary | ICD-10-CM | POA: Diagnosis not present

## 2013-05-29 DIAGNOSIS — K219 Gastro-esophageal reflux disease without esophagitis: Secondary | ICD-10-CM | POA: Diagnosis not present

## 2013-05-29 DIAGNOSIS — Z823 Family history of stroke: Secondary | ICD-10-CM

## 2013-05-29 DIAGNOSIS — I10 Essential (primary) hypertension: Secondary | ICD-10-CM | POA: Diagnosis not present

## 2013-05-29 DIAGNOSIS — I200 Unstable angina: Secondary | ICD-10-CM

## 2013-05-29 DIAGNOSIS — Z7982 Long term (current) use of aspirin: Secondary | ICD-10-CM

## 2013-05-29 DIAGNOSIS — M129 Arthropathy, unspecified: Secondary | ICD-10-CM | POA: Diagnosis present

## 2013-05-29 DIAGNOSIS — E871 Hypo-osmolality and hyponatremia: Secondary | ICD-10-CM | POA: Diagnosis present

## 2013-05-29 DIAGNOSIS — H4902 Third [oculomotor] nerve palsy, left eye: Secondary | ICD-10-CM

## 2013-05-29 DIAGNOSIS — I639 Cerebral infarction, unspecified: Secondary | ICD-10-CM

## 2013-05-29 LAB — URINALYSIS, ROUTINE W REFLEX MICROSCOPIC
Bilirubin Urine: NEGATIVE
Ketones, ur: NEGATIVE mg/dL
Leukocytes, UA: NEGATIVE
Nitrite: NEGATIVE
Specific Gravity, Urine: 1.013 (ref 1.005–1.030)
Urobilinogen, UA: 0.2 mg/dL (ref 0.0–1.0)

## 2013-05-29 LAB — CBC WITH DIFFERENTIAL/PLATELET
Basophils Absolute: 0 10*3/uL (ref 0.0–0.1)
Basophils Relative: 0 % (ref 0–1)
HCT: 39.1 % (ref 36.0–46.0)
Lymphocytes Relative: 14 % (ref 12–46)
MCHC: 35 g/dL (ref 30.0–36.0)
Monocytes Absolute: 0.5 10*3/uL (ref 0.1–1.0)
Neutro Abs: 5.8 10*3/uL (ref 1.7–7.7)
Platelets: 144 10*3/uL — ABNORMAL LOW (ref 150–400)
RDW: 12.8 % (ref 11.5–15.5)
WBC: 7.4 10*3/uL (ref 4.0–10.5)

## 2013-05-29 LAB — BASIC METABOLIC PANEL
CO2: 25 mEq/L (ref 19–32)
Calcium: 8.8 mg/dL (ref 8.4–10.5)
Chloride: 96 mEq/L (ref 96–112)
Creatinine, Ser: 0.69 mg/dL (ref 0.50–1.10)
GFR calc Af Amer: >90 mL/min (ref >90)
Sodium: 133 mEq/L — ABNORMAL LOW (ref 135–145)

## 2013-05-29 LAB — GLUCOSE, CAPILLARY
Glucose-Capillary: 126 mg/dL — ABNORMAL HIGH (ref 70–99)
Glucose-Capillary: 64 mg/dL — ABNORMAL LOW (ref 70–99)

## 2013-05-29 LAB — TROPONIN I: Troponin I: <0.3 ng/mL (ref <0.30)

## 2013-05-29 MED ORDER — TRIMETHOPRIM 100 MG PO TABS
100.0000 mg | ORAL_TABLET | Freq: Every day | ORAL | Status: DC
  Administered 2013-05-29 – 2013-05-31 (×3): 100 mg via ORAL
  Filled 2013-05-29 (×3): qty 1

## 2013-05-29 MED ORDER — OMEGA-3-ACID ETHYL ESTERS 1 G PO CAPS
2.0000 g | ORAL_CAPSULE | Freq: Two times a day (BID) | ORAL | Status: DC
  Administered 2013-05-29: 1 g via ORAL
  Administered 2013-05-30: 2 g via ORAL
  Administered 2013-05-30: 1 g via ORAL
  Administered 2013-05-31: 2 g via ORAL
  Filled 2013-05-29 (×6): qty 2

## 2013-05-29 MED ORDER — SIMVASTATIN 40 MG PO TABS
40.0000 mg | ORAL_TABLET | Freq: Every morning | ORAL | Status: DC
  Administered 2013-05-30 – 2013-05-31 (×2): 40 mg via ORAL
  Filled 2013-05-29 (×2): qty 1

## 2013-05-29 MED ORDER — ASPIRIN 325 MG PO TABS
325.0000 mg | ORAL_TABLET | Freq: Every day | ORAL | Status: DC
  Administered 2013-05-29 – 2013-05-31 (×3): 325 mg via ORAL
  Filled 2013-05-29 (×3): qty 1

## 2013-05-29 MED ORDER — FUROSEMIDE 10 MG/ML IJ SOLN
40.0000 mg | Freq: Every day | INTRAMUSCULAR | Status: DC
  Filled 2013-05-29 (×2): qty 4

## 2013-05-29 MED ORDER — SENNOSIDES-DOCUSATE SODIUM 8.6-50 MG PO TABS: 1.0000 | ORAL_TABLET | Freq: Every evening | ORAL | Status: DC | PRN

## 2013-05-29 MED ORDER — POLYETHYLENE GLYCOL 3350 17 G PO PACK
17.0000 g | PACK | Freq: Every day | ORAL | Status: DC
  Administered 2013-05-29 – 2013-05-31 (×3): 17 g via ORAL
  Filled 2013-05-29 (×4): qty 1

## 2013-05-29 MED ORDER — ASPIRIN 300 MG RE SUPP
300.0000 mg | Freq: Every day | RECTAL | Status: DC
  Filled 2013-05-29 (×3): qty 1

## 2013-05-29 MED ORDER — SODIUM CHLORIDE 0.9 % IV SOLN
Freq: Once | INTRAVENOUS | Status: AC
  Administered 2013-05-29: 12:00:00 via INTRAVENOUS

## 2013-05-29 MED ORDER — IOHEXOL 350 MG/ML SOLN
50.0000 mL | Freq: Once | INTRAVENOUS | Status: AC | PRN
  Administered 2013-05-29: 50 mL via INTRAVENOUS

## 2013-05-29 MED ORDER — ONDANSETRON HCL 4 MG/2ML IJ SOLN
4.0000 mg | Freq: Once | INTRAMUSCULAR | Status: AC
  Administered 2013-05-29: 4 mg via INTRAVENOUS
  Filled 2013-05-29: qty 2

## 2013-05-29 MED ORDER — POTASSIUM CHLORIDE CRYS ER 20 MEQ PO TBCR
40.0000 meq | EXTENDED_RELEASE_TABLET | Freq: Once | ORAL | Status: AC
  Administered 2013-05-29: 40 meq via ORAL
  Filled 2013-05-29: qty 2

## 2013-05-29 MED ORDER — ENOXAPARIN SODIUM 40 MG/0.4ML ~~LOC~~ SOLN
40.0000 mg | SUBCUTANEOUS | Status: DC
  Administered 2013-05-29 – 2013-05-30 (×2): 40 mg via SUBCUTANEOUS
  Filled 2013-05-29 (×4): qty 0.4

## 2013-05-29 MED ORDER — ACETAMINOPHEN 325 MG PO TABS
650.0000 mg | ORAL_TABLET | ORAL | Status: DC | PRN
  Administered 2013-05-29: 650 mg via ORAL
  Filled 2013-05-29: qty 2

## 2013-05-29 MED ORDER — PANTOPRAZOLE SODIUM 40 MG PO TBEC
40.0000 mg | DELAYED_RELEASE_TABLET | Freq: Every day | ORAL | Status: DC
  Administered 2013-05-30 – 2013-05-31 (×2): 40 mg via ORAL
  Filled 2013-05-29: qty 1

## 2013-05-29 NOTE — Progress Notes (Signed)
MD paged b/c Pt. Sodium is low and has order for Lasix IV.  Lasix held until doctor gives order.  Will continue to monitor patient.  Call light placed within reach.

## 2013-05-29 NOTE — ED Notes (Signed)
I attempted to perform a swallow screen on Sabrina Alvarez and we got as far as coughing.  She advised me she is nauseated and cannot eat or drink anything right now.  I gave her Zofran and asked her to call me when she feels like she can eat and drink so we can finish the swallowing evaluation.

## 2013-05-29 NOTE — H&P (Signed)
[facility] History and Physical  Sabrina Alvarez AVW:[id number] DOB: 1928/04/30 DOA: 05/29/2013  Referring physician: Rhunette Croft PCP: Lenora Boys, MD   Chief Complaint: Double vision  HPI: Sabrina Alvarez is a 79 y.o. female with past medical history of CAD, hypertension and dyslipidemia came in to the hospital because of double vision. Patient discovered that she has double vision this morning when she was walking to the bathroom around 7 AM, when she went to bed last night she did have no complaints. She does have some dizziness and nausea but no vomiting. She denies weakness, headache, slurred speech or other neurological deficits. In the ED CT scan showed no acute infarct in the anterior right putamen, no acute infarcts demonstrated. Patient needs MRI, MRI per ED physician on not be done today because of the Christmas day.  Review of Systems:  Constitutional: negative for anorexia, fevers and sweats Eyes: negative for irritation, redness and visual disturbance Ears, nose, mouth, throat, and face: negative for earaches, epistaxis, nasal congestion and sore throat Respiratory: negative for cough, dyspnea on exertion, sputum and wheezing Cardiovascular: negative for chest pain, dyspnea, lower extremity edema, orthopnea, palpitations and syncope Gastrointestinal: negative for abdominal pain, constipation, diarrhea, melena, nausea and vomiting Genitourinary:negative for dysuria, frequency and hematuria Hematologic/lymphatic: negative for bleeding, easy bruising and lymphadenopathy Musculoskeletal:negative for arthralgias, muscle weakness and stiff joints Neurological: Diplopia but negative for coordination problems, gait, headache or weakness. Endocrine: negative for diabetic symptoms including polydipsia, polyuria and weight loss Allergic/Immunologic: negative for anaphylaxis, hay fever and urticaria   Past Medical History  Diagnosis Date  . CAD (coronary artery disease)      Cath 2009  Occluded LAD, 40-50% RCA and circ managed medically   . Hypertension   . Hyperlipidemia   . GERD (gastroesophageal reflux disease)   . Hyponatremia 10/03/2011  . Arthritis     "think it's osteo; got some in my hands"  . Bladder infection, chronic 10/03/11    "have had one for over 1 yr"  . Vaginal malignant neoplasm 2000   Past Surgical History  Procedure Laterality Date  . Dilation and curettage of uterus    . Cesarean section  8295; 1960  . Abdominal hysterectomy  2000  . Hernia repair      abdominal "twice"  . Appendectomy  2000  . Tonsillectomy      as a child  . Cataract extraction w/ intraocular lens  implant, bilateral  1990's  . Repair knee ligament  ~ 2008    left   Social History:  reports that she has never smoked. She has never used smokeless tobacco. She reports that she does not drink alcohol or use illicit drugs.  Allergies  Allergen Reactions  . Macrobid [Nitrofurantoin Macrocrystal] Other (See Comments)    "I had chills & fever"    History reviewed. No pertinent family history.   Prior to Admission medications   Medication Sig Start Date End Date Taking? Authorizing Provider  amLODipine (NORVASC) 5 MG tablet Take 1 tablet (5 mg total) by mouth 2 (two) times daily. 10/04/11  Yes Othella Boyer, MD  aspirin 81 MG chewable tablet Chew 81 mg by mouth daily.   Yes Historical Provider, MD  atenolol (TENORMIN) 50 MG tablet Take 25 mg by mouth daily.   Yes Historical Provider, MD  BIOTIN 5000 PO Take 1 capsule by mouth daily.   Yes Historical Provider, MD  calcium-vitamin D (OSCAL) 250-125 MG-UNIT per tablet Take 1 tablet by mouth daily.  Yes Historical Provider, MD  Cholecalciferol (VITAMIN D3) 1000 UNITS CAPS Take 1 capsule by mouth daily.   Yes Historical Provider, MD  co-enzyme Q-10 30 MG capsule Take 30 mg by mouth daily. Hold while in hospital   Yes Historical Provider, MD  Cranberry-Vitamin C-Vitamin E (CRANBERRY PLUS VITAMIN C PO) Take 1 tablet  by mouth daily.   Yes Historical Provider, MD  glucosamine-chondroitin 500-400 MG tablet Take 1 tablet by mouth daily.   Yes Historical Provider, MD  ibuprofen (ADVIL,MOTRIN) 200 MG tablet Take 200 mg by mouth every 6 (six) hours as needed.   Yes Historical Provider, MD  losartan (COZAAR) 25 MG tablet Take 1 tablet (25 mg total) by mouth 2 (two) times daily. 10/04/11  Yes Othella Boyer, MD  omega-3 acid ethyl esters (LOVAZA) 1 G capsule Take 2 g by mouth 2 (two) times daily.   Yes Historical Provider, MD  pantoprazole (PROTONIX) 40 MG tablet Take 1 tablet (40 mg total) by mouth daily. 10/04/11  Yes Othella Boyer, MD  simvastatin (ZOCOR) 40 MG tablet Take 40 mg by mouth every morning.    Yes Historical Provider, MD  trimethoprim (TRIMPEX) 100 MG tablet Take 100 mg by mouth daily. 05/22/13  Yes Historical Provider, MD   Physical Exam: Filed Vitals:   05/29/13 1200  BP: 172/69  Pulse: 56  Temp:   Resp: 17    BP 172/69  Pulse 56  Temp(Src) 97.7 F (36.5 C) (Oral)  Resp 17  SpO2 100%  General appearance: alert, cooperative and no distress  Head: Normocephalic, without obvious abnormality, atraumatic  Eyes: conjunctivae/corneas clear. PERRL, EOM's intact. Fundi benign.  Nose: Nares normal. Septum midline. Mucosa normal. No drainage or sinus tenderness.  Throat: lips, mucosa, and tongue normal; teeth and gums normal  Neck: Supple, no masses, no cervical lymphadenopathy, no JVD appreciated, no meningeal signs Resp: clear to auscultation bilaterally  Chest wall: no tenderness  Cardio: regular rate and rhythm, S1, S2 normal, no murmur, click, rub or gallop  GI: soft, non-tender; bowel sounds normal; no masses, no organomegaly  Extremities: extremities normal, atraumatic, no cyanosis or edema  Skin: Skin color, texture, turgor normal. No rashes or lesions  Neurologic: Alert and oriented X 3, normal strength and tone. Normal symmetric reflexes. Normal coordination. Ocular movement is off  especially on the left side, patient cannot immediately and that her left eye.  Labs on Admission:  Basic Metabolic Panel:  Recent Labs Lab 05/29/13 1150  NA 133*  K 3.7  CL 96  CO2 25  GLUCOSE 144*  BUN 15  CREATININE 0.69  CALCIUM 8.8   Liver Function Tests: No results found for this basename: AST, ALT, ALKPHOS, BILITOT, PROT, ALBUMIN,  in the last 168 hours No results found for this basename: LIPASE, AMYLASE,  in the last 168 hours No results found for this basename: AMMONIA,  in the last 168 hours CBC:  Recent Labs Lab 05/29/13 1150  WBC 7.4  NEUTROABS 5.8  HGB 13.7  HCT 39.1  MCV 90.9  PLT 144*   Cardiac Enzymes:  Recent Labs Lab 05/29/13 1150  TROPONINI <0.30    BNP (last 3 results) No results found for this basename: PROBNP,  in the last 8760 hours CBG:  Recent Labs Lab 05/29/13 1135 05/29/13 1138  GLUCAP 64* 126*    Radiological Exams on Admission: Ct Head Wo Contrast  05/29/2013   CLINICAL DATA:  Diplopia and nausea  EXAM: CT HEAD WITHOUT CONTRAST  TECHNIQUE: Contiguous  axial images were obtained from the base of the skull through the vertex without intravenous contrast. Study was obtained within 24 hr of patient's arrival at the emergency department.  COMPARISON:  None.  FINDINGS: There is mild diffuse atrophy. There is no mass, hemorrhage, extra-axial fluid collection, or midline shift. There is mild patchy small vessel disease in the centra semiovale bilaterally. Small vessel disease is noted in the anterior limbs of both external capsules and in the anterior limb of the right internal capsule. There is evidence of what appears to be a prior infarct in the right putamen anteriorly. Elsewhere, gray-white compartments appear normal. There is no demonstrable acute infarct on this study.  The bony calvarium appears intact.  The mastoid air cells are clear.  IMPRESSION: Atrophy with supratentorial small vessel disease. Age uncertain but probably non acute  infarct in the anterior right putamen. No acute infarct is demonstrated on this study. There is no intracranial mass or hemorrhage.   Electronically Signed   By: Bretta Bang M.D.   On: 05/29/2013 13:01    EKG: Independently reviewed.   Assessment/Plan Active Problems:   Hypertension   GERD (gastroesophageal reflux disease)   Acute ischemic stroke   Double vision   Leg edema   Diplopia -Likely primary neurological event, CT scan of the head showed nonacute infarct involving the anterior putamen. -Rule out acute CVA. -Rest of CVA workup including MRI of the brain, MRA. Carotid duplex, 2-D echocardiogram and 12-lead EKG. -Telemetry to rule out arrhythmias. Check A1c and lipid panel -PT/OT/SLP to evaluate and treat. -Patient is on low-dose aspirin 81 mg, increased to 325 mg, neurology please advise if this considered as aspirin failure.  CAD -Significant CAD, last cardiac cath on 10/03/2011 showed 100% stenosis of the LAD. -Circumflex artery showed 35% stenosis with small branches has severe stenosis up to 99%. -And this is being treated medically with beta blockers and aspirin.  Dyslipidemia -Patient is on Zocor and fish oral, continued. -Check lipid panel in a.m.  Bilateral lower extremity edema -Check 2-D echo, last ejection fraction in the cardiac cath was 60%. -Renal function looks okay, I will check albumin level as well. -Lasix 40 mg IV, followed BMP in a.m.  Code Status: Full code Family Communication: *Plan discussed with the patient in the presence of her son at bedside.  Disposition Plan: Inpatient, neuro telemetry, anticipate length of stay to be greater than 2 good nights.  Time spent: 70 minutes  [facility] Pager [phone]

## 2013-05-29 NOTE — ED Notes (Signed)
Pt c/o double vision starting upon waking this am with nasuea

## 2013-05-29 NOTE — ED Notes (Signed)
Pulled some blood out of one of her lab tubes and the result was 64.  Patient is not diabetic so we repeated the test using blood from a finger stick and it was 126.

## 2013-05-29 NOTE — Progress Notes (Signed)
Called ED and received report on pt. From Bonadelle Ranchos, RN.

## 2013-05-29 NOTE — ED Provider Notes (Signed)
CSN: [id number]     Arrival date & time 05/29/13  1115 History   First MD Initiated Contact with Patient 05/29/13 1127     Chief Complaint  Patient presents with  . Diplopia  . Nausea   (Consider location/radiation/quality/duration/timing/severity/associated sxs/prior Treatment) HPI Comments: Pt comes in with cc of double vision and nausea. Pt has no hx of strokes, but does have hx of CAD, HTN, HL. Pt woke up with double vision. Also feels dizzy when she walks and has nausea. No headache, no eye pain, no numbness, tingling, weakness.   The history is provided by the patient.    Past Medical History  Diagnosis Date  . CAD (coronary artery disease)     Cath 2009  Occluded LAD, 40-50% RCA and circ managed medically   . Hypertension   . Hyperlipidemia   . GERD (gastroesophageal reflux disease)   . Hyponatremia 10/03/2011  . Arthritis     "think it's osteo; got some in my hands"  . Bladder infection, chronic 10/03/11    "have had one for over 1 yr"  . Vaginal malignant neoplasm 2000   Past Surgical History  Procedure Laterality Date  . Dilation and curettage of uterus    . Cesarean section  0102; 1960  . Abdominal hysterectomy  2000  . Hernia repair      abdominal "twice"  . Appendectomy  2000  . Tonsillectomy      as a child  . Cataract extraction w/ intraocular lens  implant, bilateral  1990's  . Repair knee ligament  ~ 2008    left   History reviewed. No pertinent family history. History  Substance Use Topics  . Smoking status: Never Smoker   . Smokeless tobacco: Never Used  . Alcohol Use: No   OB History   Grav Para Term Preterm Abortions TAB SAB Ect Mult Living                 Review of Systems  Constitutional: Negative for activity change.  HENT: Negative for facial swelling.   Eyes: Positive for visual disturbance.  Respiratory: Negative for cough, shortness of breath and wheezing.   Cardiovascular: Negative for chest pain.  Gastrointestinal: Positive  for nausea. Negative for vomiting, abdominal pain, diarrhea, constipation, blood in stool and abdominal distention.  Genitourinary: Negative for hematuria and difficulty urinating.  Musculoskeletal: Negative for neck pain.  Skin: Negative for color change.  Neurological: Positive for dizziness. Negative for speech difficulty.  Hematological: Does not bruise/bleed easily.  Psychiatric/Behavioral: Negative for confusion.    Allergies  Macrobid  Home Medications   Current Outpatient Rx  Name  Route  Sig  Dispense  Refill  . amLODipine (NORVASC) 5 MG tablet   Oral   Take 1 tablet (5 mg total) by mouth 2 (two) times daily.         Marland Kitchen aspirin 81 MG chewable tablet   Oral   Chew 81 mg by mouth daily.         Marland Kitchen atenolol (TENORMIN) 50 MG tablet   Oral   Take 25 mg by mouth daily.         Marland Kitchen losartan (COZAAR) 25 MG tablet   Oral   Take 1 tablet (25 mg total) by mouth 2 (two) times daily.         . simvastatin (ZOCOR) 40 MG tablet   Oral   Take 40 mg by mouth every morning.          Marland Kitchen  trimethoprim (TRIMPEX) 100 MG tablet   Oral   Take 100 mg by mouth daily.         Marland Kitchen BIOTIN 5000 PO   Oral   Take 1 capsule by mouth daily.         . calcium-vitamin D (OSCAL) 250-125 MG-UNIT per tablet   Oral   Take 1 tablet by mouth daily.         . Cholecalciferol (VITAMIN D3) 1000 UNITS CAPS   Oral   Take 1 capsule by mouth daily.         Marland Kitchen co-enzyme Q-10 30 MG capsule   Oral   Take 30 mg by mouth daily. Hold while in hospital         . Cranberry-Vitamin C-Vitamin E (CRANBERRY PLUS VITAMIN C PO)   Oral   Take 1 tablet by mouth daily.         . diphenhydrAMINE (BENADRYL) 25 MG tablet   Oral   Take 25 mg by mouth every 6 (six) hours as needed. For allergy         . glucosamine-chondroitin 500-400 MG tablet   Oral   Take 1 tablet by mouth daily.         Marland Kitchen ibuprofen (ADVIL,MOTRIN) 200 MG tablet   Oral   Take 200 mg by mouth every 6 (six) hours as  needed.         Marland Kitchen omega-3 acid ethyl esters (LOVAZA) 1 G capsule   Oral   Take 2 g by mouth 2 (two) times daily.         . pantoprazole (PROTONIX) 40 MG tablet   Oral   Take 1 tablet (40 mg total) by mouth daily.          BP 179/67  Pulse 53  Temp(Src) 97.7 F (36.5 C) (Oral)  Resp 21  SpO2 98% Physical Exam  Nursing note and vitals reviewed. Constitutional: She is oriented to person, place, and time. She appears well-developed and well-nourished.  HENT:  Head: Normocephalic and atraumatic.  Eyes: EOM are normal. Pupils are equal, round, and reactive to light.  Neck: Neck supple.  Cardiovascular: Normal rate, regular rhythm and normal heart sounds.   No murmur heard. Pulmonary/Chest: Effort normal. No respiratory distress.  Abdominal: Soft. She exhibits no distension. There is no tenderness. There is no rebound and no guarding.  Neurological: She is alert and oriented to person, place, and time. Coordination normal.  Left eye has a droop, forehead is spared. Left eye: medial gaze is compromised. No monoocular diplopia cerebellar exam normal, Motor and sensory exam are equal and normal for upper and lower extremity  Skin: Skin is warm and dry.    ED Course  Procedures (including critical care time) Labs Review Labs Reviewed  GLUCOSE, CAPILLARY - Abnormal; Notable for the following:    Glucose-Capillary 64 (*)    All other components within normal limits  GLUCOSE, CAPILLARY - Abnormal; Notable for the following:    Glucose-Capillary 126 (*)    All other components within normal limits  CBC WITH DIFFERENTIAL  BASIC METABOLIC PANEL  TROPONIN I  URINALYSIS, ROUTINE W REFLEX MICROSCOPIC   Imaging Review No results found.  EKG Interpretation    Date/Time:  Thursday May 29 2013 11:38:35 EST Ventricular Rate:  128 PR Interval:  75 QRS Duration: 116 QT Interval:  494 QTC Calculation: 721 R Axis:   -59 Text Interpretation:  Sinus tachycardia Multiform  ventricular premature complexes Short PR interval LAD,  consider left anterior fascicular block Borderline ST elevation, lateral leads No acute findings Confirmed by Rhunette Croft, MD, Sheala Dosh ([id number]) on 05/29/2013 1:22:47 PM            MDM  No diagnosis found.  Pt comes in with cc of double vision. Last normal last night. Exam consistent with medial rectus nerve palsy for the left eye. She has binocular diplopia. DDX: Nerve palsy MS Acute stroke Opthalmic etiology   Derwood Kaplan, MD 05/29/13 1330

## 2013-05-29 NOTE — Consult Note (Signed)
Neurology Consultation Reason for Consult: Diplopia Referring Physician: Rhunette Croft, A  CC: Diplopia  History is obtained from: Patient  HPI: Sabrina Alvarez is a 79 y.o. female with a history of hypertension, hyperlipidemia who presents with diplopia that is present on awakening. She was evaluated in the emergency department and also complained of nausea as well as dizziness. She has not vomited, but has had nausea intermittently throughout the day. She denies headache.   LKW: 12/24 prior to bed tpa given?: no, outside of window    ROS: A 14 point ROS was performed and is negative except as noted in the HPI.  Past Medical History  Diagnosis Date  . CAD (coronary artery disease)     Cath 2009  Occluded LAD, 40-50% RCA and circ managed medically   . Hypertension   . Hyperlipidemia   . GERD (gastroesophageal reflux disease)   . Hyponatremia 10/03/2011  . Arthritis     "think it's osteo; got some in my hands"  . Bladder infection, chronic 10/03/11    "have had one for over 1 yr"  . Vaginal malignant neoplasm 2000    Family History: Positive for stroke  Social History: Tob: denies  Exam: Current vital signs: BP 159/65  Pulse 71  Temp(Src) 98 F (36.7 C) (Oral)  Resp 18  SpO2 96% Vital signs in last 24 hours: Temp:  [97.7 F (36.5 C)-98 F (36.7 C)] 98 F (36.7 C) (12/25 1800) Pulse Rate:  [53-72] 71 (12/25 1515) Resp:  [16-21] 18 (12/25 1515) BP: (159-191)/(65-77) 159/65 mmHg (12/25 1515) SpO2:  [96 %-100 %] 96 % (12/25 1515)  General: in bed, NAD CV: RRR Mental Status: Patient is awake, alert, oriented to person, place, month, year, and situation. Immediate and remote memory are intact. Patient is able to give a clear and coherent history. No signs of aphasia or neglect Cranial Nerves: II: Visual Fields are full. Pupils are equal, round, and reactive to light.  Discs are difficult to visualize. III,IV, VI: Right eye has intact EOM. Left eye has absent  adduction and severely impaired up and down gaze. Ptosis on the left.  V: Facial sensation is symmetric to temperature VII: Facial movement is symmetric.  VIII: hearing is intact to voice X: Uvula elevates symmetrically XI: Shoulder shrug is symmetric. XII: tongue is midline without atrophy or fasciculations.  Motor: Tone is normal. Bulk is normal. 5/5 strength was present in all four extremities.  Sensory: Sensation is symmetric to light touch and temperature in the arms and legs. Deep Tendon Reflexes: 2+ and symmetric in the biceps and patellae.  Plantars: Toes are downgoing bilaterally.  Cerebellar: FNF and HKS are intact bilaterally Gait: Not tested due to patient safety concerns.    I have reviewed labs in epic and the results pertinent to this consultation are: Mild hyponatremia  I have reviewed the images obtained:CT head - negative  Impression: 79 yo F with new left third nerve palsy associated with unsteadiness and nausea. Given the associated symptoms, I do think that it is necessary to rule out an infranuclear, intraxial lesion causing this palsy.  If MRI is negative, then likely this represents a microvascular third nerve insult.   Recommendations: 1) MRI brain w/wo contrast  2) CTA head and neck(to rule out aneurysm and eval posterior circulation) 3) No need for carotid dopplers.  4) LDL, A1C 5) ASA 325mg  6) PT, OT reasonable given unsteadiness.   Ritta Slot, MD [facility] [phone]  If 7pm- 7am, please page  neurology on call at [phone].

## 2013-05-29 NOTE — ED Notes (Signed)
Family at bedside. 

## 2013-05-30 ENCOUNTER — Inpatient Hospital Stay (HOSPITAL_COMMUNITY): Payer: Medicare Other

## 2013-05-30 DIAGNOSIS — G8389 Other specified paralytic syndromes: Secondary | ICD-10-CM | POA: Diagnosis not present

## 2013-05-30 DIAGNOSIS — I6789 Other cerebrovascular disease: Secondary | ICD-10-CM | POA: Diagnosis not present

## 2013-05-30 DIAGNOSIS — H4900 Third [oculomotor] nerve palsy, unspecified eye: Secondary | ICD-10-CM | POA: Diagnosis not present

## 2013-05-30 DIAGNOSIS — H532 Diplopia: Secondary | ICD-10-CM | POA: Diagnosis not present

## 2013-05-30 DIAGNOSIS — I10 Essential (primary) hypertension: Secondary | ICD-10-CM | POA: Diagnosis not present

## 2013-05-30 DIAGNOSIS — I6350 Cerebral infarction due to unspecified occlusion or stenosis of unspecified cerebral artery: Secondary | ICD-10-CM | POA: Diagnosis not present

## 2013-05-30 DIAGNOSIS — E785 Hyperlipidemia, unspecified: Secondary | ICD-10-CM | POA: Diagnosis not present

## 2013-05-30 LAB — LIPID PANEL
HDL: 59 mg/dL (ref >39)
LDL Cholesterol: 73 mg/dL (ref 0–99)
Total CHOL/HDL Ratio: 2.5 RATIO
Triglycerides: 64 mg/dL (ref <150)
VLDL: 13 mg/dL (ref 0–40)

## 2013-05-30 LAB — COMPREHENSIVE METABOLIC PANEL
ALT: 13 U/L (ref 0–35)
AST: 19 U/L (ref 0–37)
Albumin: 3.5 g/dL (ref 3.5–5.2)
Alkaline Phosphatase: 49 U/L (ref 39–117)
Calcium: 8.7 mg/dL (ref 8.4–10.5)
Chloride: 97 mEq/L (ref 96–112)
Creatinine, Ser: 0.72 mg/dL (ref 0.50–1.10)
GFR calc non Af Amer: 77 mL/min — ABNORMAL LOW (ref >90)
Sodium: 131 mEq/L — ABNORMAL LOW (ref 135–145)
Total Bilirubin: 0.5 mg/dL (ref 0.3–1.2)
Total Protein: 6 g/dL (ref 6.0–8.3)

## 2013-05-30 LAB — CBC
MCH: 31 pg (ref 26.0–34.0)
MCHC: 34.3 g/dL (ref 30.0–36.0)
MCV: 90.5 fL (ref 78.0–100.0)
Platelets: 150 10*3/uL (ref 150–400)
RDW: 12.8 % (ref 11.5–15.5)

## 2013-05-30 LAB — HEMOGLOBIN A1C: Hgb A1c MFr Bld: 5.8 % — ABNORMAL HIGH (ref <5.7)

## 2013-05-30 MED ORDER — GADOBENATE DIMEGLUMINE 529 MG/ML IV SOLN
10.0000 mL | Freq: Once | INTRAVENOUS | Status: AC | PRN
  Administered 2013-05-30: 10 mL via INTRAVENOUS

## 2013-05-30 NOTE — Evaluation (Signed)
Occupational Therapy Evaluation Patient Details Name: Sabrina Alvarez MRN: [id number] DOB: September 13, 1927 Today's Date: 05/30/2013 Time: 7829-5621 OT Time Calculation (min): 72 min  OT Assessment / Plan / Recommendation History of present illness  Sabrina Alvarez is a 79 y.o. female with past medical history of CAD, hypertension and dyslipidemia came in to the hospital because of double vision. Patient discovered that she has double vision this morning when she was walking to the bathroom around 7 AM, when she went to bed last night she did have no complaints. She does have some dizziness and nausea but no vomiting. She denies weakness, headache, slurred speech or other neurological deficits.  MRI with no acute infarct   Clinical Impression   Pt admitted with above.  She currently demonstrates visual deficits (diplopia), impaired balance, and subsequently impaired depth perceoption.   Lt  Lens of glasses occluded to adequately occlude vision to reduce diplopia while allowing both eyes to remain open to allow for binocularity, as well as peripheral visual input to improve balance.   Currently, she requires min guard to min A with BADLs due to balance.  Her problem solving and processing speed are impaired at this time due to impaired visual input.  She will benefit from continued OT to maximize safety and independence with BADLs.  Initially recommend HHOT to work on compensatory techniques to allow her to return to modified independent in home environment, with plan to transition to OPOT to futher work on community based skills as pt was very independent PTA.  Spoke with pt and sons re: recommendation for no driving and they are aware and agree.      OT Assessment  Patient needs continued OT Services    Follow Up Recommendations  Home health OT;Supervision/Assistance - 24 hour (with a plan to transition to OP)    Barriers to Discharge      Equipment Recommendations  Tub/shower seat     Recommendations for Other Services    Frequency  Min 2X/week    Precautions / Restrictions Precautions Precautions: Fall Restrictions Weight Bearing Restrictions: No   Pertinent Vitals/Pain     ADL  Eating/Feeding: Supervision/safety Where Assessed - Eating/Feeding: Edge of bed;Chair Grooming: Wash/dry hands;Wash/dry face;Teeth care;Minimal assistance Where Assessed - Grooming: Supported standing Upper Body Bathing: Set up;Supervision/safety Where Assessed - Upper Body Bathing: Unsupported sitting Lower Body Bathing: Minimal assistance Where Assessed - Lower Body Bathing: Supported sit to stand Upper Body Dressing: Supervision/safety Where Assessed - Upper Body Dressing: Unsupported sitting Lower Body Dressing: Minimal assistance Where Assessed - Lower Body Dressing: Supported sit to stand Toilet Transfer: Minimal assistance Toilet Transfer Method: Sit to stand;Stand pivot Acupuncturist: Comfort height toilet Toileting - Architect and Hygiene: Min guard Where Assessed - Engineer, mining and Hygiene: Standing Equipment Used: Rolling walker Transfers/Ambulation Related to ADLs: Pt requires min A without RW, and min guard assist with RW in open areas.  Min A in confined spaces  ADL Comments: Pt requires assist to locate items and for balance    OT Diagnosis: Disturbance of vision  OT Problem List: Impaired balance (sitting and/or standing);Impaired vision/perception;Decreased knowledge of use of DME or AE OT Treatment Interventions: Self-care/ADL training;Visual/perceptual remediation/compensation;Patient/family education;Balance training;DME and/or AE instruction   OT Goals(Current goals can be found in the care plan section) Acute Rehab OT Goals Patient Stated Goal: To get better OT Goal Formulation: With patient Time For Goal Achievement: 06/06/13 Potential to Achieve Goals: Good  Visit Information  Last OT Received  On:  05/30/13 Assistance Needed: +1 History of Present Illness:  Sabrina Alvarez is a 79 y.o. female with past medical history of CAD, hypertension and dyslipidemia came in to the hospital because of double vision. Patient discovered that she has double vision this morning when she was walking to the bathroom around 7 AM, when she went to bed last night she did have no complaints. She does have some dizziness and nausea but no vomiting. She denies weakness, headache, slurred speech or other neurological deficits.  MRI with no acute infarct       Prior Functioning     Home Living Family/patient expects to be discharged to:: Private residence Living Arrangements: Children Available Help at Discharge: Family;Available PRN/intermittently (son works during the day) Type of Home: House Home Access: Stairs to enter [employer] of Steps: 1-2 Entrance Stairs-Rails: None Home Layout: Laundry or work area in basement;Two level Alternate Level Stairs-Number of Steps: Pt with full flight of steps to the basement Home Equipment: None Prior Function Level of Independence: Independent Comments: Pt was very active and 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  Sons describe her as constantly active Communication Communication: No difficulties Dominant Hand: Right         Vision/Perception Vision - History Baseline Vision: Bifocals Patient Visual Report: Diplopia Vision - Assessment Eye Alignment: Impaired (comment) Vision Assessment: Vision tested Additional Comments: EOMs  Rt. eye.  Pt with ~80% adduction, but difficulty sustaining gaze and difficulty sustaining superior and inferior gaze.   Lt. eye.  Pt with no adduction and limited superior/inferior gaze.   Pt with Lt. eye patched.  Patch removed and lens of Lt glasses occluded with tape (unable to spot occlude due to severity of diplopia/musular weakness.   Pt insructed in rationale for occlusion.  With Lt. lens  occluded, pt was able to reach 3 paragraphs of small print with increased time and minimal errors.  Pt reports letters jumping moreso with writing on Lt of page vs. Rt.  Worked with pt on negotiating environments with min verbal cues.  Pt requires increased time  Perception Perception: Impaired (depth perception impaired)   Cognition  Cognition Arousal/Alertness: Awake/alert Behavior During Therapy: Anxious Overall Cognitive Status: Impaired/Different from baseline Area of Impairment: Memory;Problem solving Memory: Decreased short-term memory Problem Solving: Requires verbal cues;Requires tactile cues;Slow processing General Comments: Pt is very anxious.  Deficits are likely due to anxiety and visual deficits as pt is demonstrating diffiiculty due to impaired visual imput    Extremity/Trunk Assessment Upper Extremity Assessment Upper Extremity Assessment: Overall WFL for tasks assessed Lower Extremity Assessment Lower Extremity Assessment: Defer to PT evaluation Cervical / Trunk Assessment Cervical / Trunk Assessment: Normal     Mobility Bed Mobility Bed Mobility: Supine to Sit;Sitting - Scoot to Edge of Bed Supine to Sit: 6: Modified independent (Device/Increase time) Sitting - Scoot to Edge of Bed: 6: Modified independent (Device/Increase time) Transfers Transfers: Sit to Stand;Stand to Sit Sit to Stand: 4: Min guard;With upper extremity assist;From bed Stand to Sit: 4: Min guard;With upper extremity assist;To bed Details for Transfer Assistance: min guard for balance and vision      Exercise     Balance Balance Balance Assessed: Yes Static Standing Balance Static Standing - Balance Support: No upper extremity supported Static Standing - Level of Assistance: Other (comment) (min guard assist) Dynamic Standing Balance Dynamic Standing - Balance Support: No upper extremity supported Dynamic Standing - Level of Assistance: 4: Min assist Dynamic Standing -  Balance Activities:  Other (comment) (simulated ADLs) Dynamic Standing - Comments: simulated ADLs   End of Session OT - End of Session Equipment Utilized During Treatment: Rolling walker Activity Tolerance: Patient tolerated treatment well Patient left: Other (comment) (in w/c going for procedure) Nurse Communication: Mobility status  GO     Sabrina Alvarez, Sabrina Alvarez 05/30/2013, 1:36 PM

## 2013-05-30 NOTE — Progress Notes (Signed)
PT Cancellation Note  Patient Details Name: Sabrina Alvarez MRN: [id number] DOB: Dec 08, 1927   Cancelled Treatment:    Reason Eval/Treat Not Completed: Patient at procedure or test/unavailable   Fabio Asa 05/30/2013, 10:34 AM

## 2013-05-30 NOTE — Progress Notes (Signed)
  Echocardiogram 2D Echocardiogram has been performed.  Cathie Beams 05/30/2013, 1:37 PM

## 2013-05-30 NOTE — Progress Notes (Signed)
Subjective: No further dizziness or nausea  Exam: Filed Vitals:   05/30/13 0540  BP: 147/57  Pulse: 52  Temp: 98 F (36.7 C)  Resp: 18   Gen: In bed, NAD MS: Awake, alert, oriented to month, year, place WU:[id number], left eye with severely impaired adduction, moderately impaired elevation and depression.  Motor: 5/5 thorughout Sensory:intact to LT   Impression: 79 yo F with new left third nerve palsy associated with unsteadiness and nausea. Given the associated symptoms, I do think that it is necessary to rule out an infranuclear, intraxial lesion causing this palsy.  If MRI is negative, then likely this represents a microvascular third nerve insult.    Recommendations: 1) f/u MRI results.  2) continue asa  Ritta Slot, MD [facility] [phone]  If 7pm- 7am, please page neurology on call at [phone].

## 2013-05-30 NOTE — Evaluation (Signed)
Physical Therapy Evaluation Patient Details Name: Sabrina Alvarez MRN: [id number] DOB: 1927/10/19 Today's Date: 05/30/2013 Time: 4098-1191 PT Time Calculation (min): 20 min  PT Assessment / Plan / Recommendation History of Present Illness   Sabrina Alvarez is a 79 y.o. female with past medical history of CAD, hypertension and dyslipidemia came in to the hospital because of double vision. Patient discovered that she has double vision this morning when she was walking to the bathroom around 7 AM, when she went to bed last night she did have no complaints. She does have some dizziness and nausea but no vomiting. She denies weakness, headache, slurred speech or other neurological deficits.  MRI with no acute infarct  Clinical Impression  Patient demonstrates deficits in functional mobility as indicated below. Will benefit from continued skilled PT to address deficits and maximize function. Will continue to see as indicated and progress activity as tolerated. Recommend HHPT upon discharge to address mobility in home with new visual changes.    PT Assessment  Patient needs continued PT services    Follow Up Recommendations  Home health PT;Supervision/Assistance - 24 hour          Equipment Recommendations  Rolling walker with 5" wheels       Frequency Min 4X/week    Precautions / Restrictions Precautions Precautions: Fall Restrictions Weight Bearing Restrictions: No   Pertinent Vitals/Pain No pain at this time      Mobility  Bed Mobility Bed Mobility: Supine to Sit;Sitting - Scoot to Edge of Bed Supine to Sit: 6: Modified independent (Device/Increase time) Sitting - Scoot to Edge of Bed: 6: Modified independent (Device/Increase time) Transfers Transfers: Sit to Stand;Stand to Sit Sit to Stand: 4: Min guard;With upper extremity assist;From bed Stand to Sit: 4: Min guard;With upper extremity assist;To bed Details for Transfer Assistance: min guard for balance and vision   Ambulation/Gait Ambulation/Gait Assistance: 5: Supervision;4: Min guard Ambulation Distance (Feet): 620 Feet (200 with RW) Assistive device: Rolling walker;None Ambulation/Gait Assistance Details: intially very unsteady, visual deficits impeding ambulation, provided RW for tactile compensation and stability Gait Pattern: Step-through pattern;Decreased stride length;Narrow base of support Gait velocity: decreased initially, improved with RW General Gait Details: very unsteady without device, improved stability when using RW, VCs for positioning and proper use of assistive device        PT Diagnosis: Difficulty walking;Abnormality of gait  PT Problem List: Decreased balance;Decreased mobility;Decreased knowledge of use of DME PT Treatment Interventions: DME instruction;Gait training;Stair training;Functional mobility training;Therapeutic activities;Therapeutic exercise;Balance training;Patient/family education     PT Goals(Current goals can be found in the care plan section) Acute Rehab PT Goals Patient Stated Goal: To get better PT Goal Formulation: With patient/family Time For Goal Achievement: 06/13/13 Potential to Achieve Goals: Good  Visit Information  Last PT Received On: 05/30/13 Assistance Needed: +1 Reason Eval/Treat Not Completed: Patient at procedure or test/unavailable History of Present Illness:  Sabrina Alvarez is a 79 y.o. female with past medical history of CAD, hypertension and dyslipidemia came in to the hospital because of double vision. Patient discovered that she has double vision this morning when she was walking to the bathroom around 7 AM, when she went to bed last night she did have no complaints. She does have some dizziness and nausea but no vomiting. She denies weakness, headache, slurred speech or other neurological deficits.  MRI with no acute infarct       Prior Functioning  Home Living Family/patient expects to be discharged to:: Private  residence Living Arrangements: Children Available Help at Discharge: Family;Available PRN/intermittently (son works during the day) Type of Home: House Home Access: Stairs to enter [employer] of Steps: 1-2 Entrance Stairs-Rails: None Home Layout: Laundry or work area in basement;Two level Alternate Level Stairs-Number of Steps: Pt with full flight of steps to the basement Home Equipment: None Prior Function Level of Independence: Independent Comments: Pt was very active and 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  Sons describe her as constantly active Communication Communication: No difficulties Dominant Hand: Right    Cognition  Cognition Arousal/Alertness: Awake/alert Behavior During Therapy: Anxious Overall Cognitive Status: Impaired/Different from baseline Area of Impairment: Memory;Problem solving Memory: Decreased short-term memory Problem Solving: Requires verbal cues;Requires tactile cues;Slow processing General Comments: Pt is very anxious.  Deficits are likely due to anxiety and visual deficits as pt is demonstrating diffiiculty due to impaired visual imput    Extremity/Trunk Assessment Upper Extremity Assessment Upper Extremity Assessment: Overall WFL for tasks assessed Lower Extremity Assessment Lower Extremity Assessment: Overall WFL for tasks assessed Cervical / Trunk Assessment Cervical / Trunk Assessment: Normal   Balance Balance Balance Assessed: Yes Static Standing Balance Static Standing - Balance Support: No upper extremity supported Static Standing - Level of Assistance: Other (comment) (min guard assist) Dynamic Standing Balance Dynamic Standing - Balance Support: No upper extremity supported Dynamic Standing - Level of Assistance: 4: Min assist Dynamic Standing - Balance Activities: Other (comment) (simulated ADLs) Dynamic Standing - Comments: simulated ADLs High Level Balance High Level Balance Activites: Side  stepping;Direction changes;Turns High Level Balance Comments: min guard  End of Session PT - End of Session Equipment Utilized During Treatment: Gait belt (occluded glasses for visual assist) Activity Tolerance: Patient tolerated treatment well Patient left: Other (comment) (with transport) Nurse Communication: Mobility status  GP     Fabio Asa 05/30/2013, 1:44 PM Charlotte Crumb, PT DPT  [phone]

## 2013-05-30 NOTE — Evaluation (Signed)
Speech Language Pathology Evaluation Patient Details Name: Sabrina Alvarez MRN: [id number] DOB: 1928/01/21 Today's Date: 05/30/2013 Time: 7829-5621 SLP Time Calculation (min): 12 min  Problem List:  Patient Active Problem List   Diagnosis Date Noted  . Acute ischemic stroke 05/29/2013  . Double vision 05/29/2013  . Leg edema 05/29/2013  . Third nerve palsy 05/29/2013  . Unstable angina 10/03/2011  . CAD (coronary artery disease)   . Hypertension   . Hyperlipidemia   . GERD (gastroesophageal reflux disease)   . History of malignant neoplasm of vagina    Past Medical History:  Past Medical History  Diagnosis Date  . CAD (coronary artery disease)     Cath 2009  Occluded LAD, 40-50% RCA and circ managed medically   . Hypertension   . Hyperlipidemia   . GERD (gastroesophageal reflux disease)   . Hyponatremia 10/03/2011  . Arthritis     "think it's osteo; got some in my hands"  . Bladder infection, chronic 10/03/11    "have had one for over 1 yr"  . Vaginal malignant neoplasm 2000   Past Surgical History:  Past Surgical History  Procedure Laterality Date  . Dilation and curettage of uterus    . Cesarean section  3086; 1960  . Abdominal hysterectomy  2000  . Hernia repair      abdominal "twice"  . Appendectomy  2000  . Tonsillectomy      as a child  . Cataract extraction w/ intraocular lens  implant, bilateral  1990's  . Repair knee ligament  ~ 2008    left   HPI:  Sabrina Alvarez is a 79 y.o. female with past medical history of CAD, hypertension and dyslipidemia came in to the hospital because of double vision. Patient discovered that she has double vision this morning when she was walking to the bathroom around 7 AM, when she went to bed last night she did have no complaints. She does have some dizziness and nausea but no vomiting. She denies weakness, headache, slurred speech or other neurological deficits. MRI with no acute infarct    Assessment / Plan /  Recommendation Clinical Impression  Pt presents with normal language, speech, and cognitive functions assessed through verbal response (e.g., verbal problem solving, short term recall.) MRI negative for acute infarct.   OT notes deficits in problem solving and processing, attributable to impaired visual input - will defer f/u to OT; SLP to sign off.      SLP Assessment  Patient does not need any further Speech Lanaguage Pathology Services             SLP Goals     SLP Evaluation Prior Functioning  Cognitive/Linguistic Baseline: Within functional limits Type of Home: House  Lives With: Son Available Help at Discharge: Family;Available PRN/intermittently   Cognition  Overall Cognitive Status: Within Functional Limits for tasks assessed Arousal/Alertness: Awake/alert Orientation Level: Oriented X4 Attention: Selective Selective Attention: Appears intact Memory: Appears intact Problem Solving: Appears intact Executive Function: Reasoning Reasoning: Appears intact    Comprehension  Auditory Comprehension Overall Auditory Comprehension: Appears within functional limits for tasks assessed    Expression Expression Primary Mode of Expression: Verbal Verbal Expression Overall Verbal Expression: Appears within functional limits for tasks assessed Written Expression Dominant Hand: Right   Oral / Motor Oral Motor/Sensory Function Overall Oral Motor/Sensory Function: Appears within functional limits for tasks assessed Motor Speech Overall Motor Speech: Appears within functional limits for tasks assessed   Sabrina Alvarez L. Sabrina Mcbryar, MA CCC/SLP  Pager [phone]      Sabrina Alvarez 05/30/2013, 5:01 PM

## 2013-05-30 NOTE — Progress Notes (Signed)
[facility] PROGRESS NOTE  Sabrina Alvarez:[id number] DOB: [birth year] DOA: 05/29/2013 PCP: Lenora Boys, MD  Assessment/Plan: Diplopia  -Likely primary neurological event, CT scan of the head showed nonacute infarct involving the anterior putamen.  -Rule out acute CVA.  -Rest of CVA workup including MRI of the brain/MRA.  2-D echocardiogram -Telemetry to rule out arrhythmias A1c  lipid panel: LDL 73  -PT/OT/SLP to evaluate and treat.  -ASA 325 mg,  Hyponatremia -fluid restrict  CAD  -Significant CAD, last cardiac cath on 10/03/2011 showed 100% stenosis of the LAD.  -Circumflex artery showed 35% stenosis with small branches has severe stenosis up to 99%.  -And this is being treated medically with beta blockers and aspirin.   Dyslipidemia  -Patient is on Zocor and fish oral, continued.   Bilateral lower extremity edema  -Check 2-D echo, last ejection fraction in the cardiac cath was 60%.  -improved    Code Status: full Family Communication: no family at bedside Disposition Plan: await work up   Consultants:  neuro  Procedures:    Antibiotics:    HPI/Subjective: Still having diplopia but better with 1 eye covered   Objective: Filed Vitals:   05/30/13 0540  BP: 147/57  Pulse: 52  Temp: 98 F (36.7 C)  Resp: 18   No intake or output data in the 24 hours ending 05/30/13 0815 Filed Weights   05/29/13 2014  Weight: 59.784 kg (131 lb 12.8 oz)    Exam:  General appearance: alert, cooperative and no distress  Resp: clear to auscultation bilaterally  Cardio: regular rate and rhythm, S1, S2 normal, no murmur, click, rub or gallop  GI: soft, non-tender; bowel sounds normal; no masses, no organomegaly  Extremities: extremities normal, atraumatic, no cyanosis or edema  Neurologic: Alert and oriented X 3, normal strength and tone. Normal symmetric reflexes. Normal coordination. Left eye covered   Data Reviewed: Basic Metabolic  Panel:  Recent Labs Lab 05/29/13 1150 05/30/13 0430  NA 133* 131*  K 3.7 4.0  CL 96 97  CO2 25 25  GLUCOSE 144* 91  BUN 15 13  CREATININE 0.69 0.72  CALCIUM 8.8 8.7   Liver Function Tests:  Recent Labs Lab 05/30/13 0430  AST 19  ALT 13  ALKPHOS 49  BILITOT 0.5  PROT 6.0  ALBUMIN 3.5   No results found for this basename: LIPASE, AMYLASE,  in the last 168 hours No results found for this basename: AMMONIA,  in the last 168 hours CBC:  Recent Labs Lab 05/29/13 1150 05/30/13 0430  WBC 7.4 6.0  NEUTROABS 5.8  --   HGB 13.7 12.4  HCT 39.1 36.2  MCV 90.9 90.5  PLT 144* 150   Cardiac Enzymes:  Recent Labs Lab 05/29/13 1150  TROPONINI <0.30   BNP (last 3 results) No results found for this basename: PROBNP,  in the last 8760 hours CBG:  Recent Labs Lab 05/29/13 1135 05/29/13 1138  GLUCAP 64* 126*    No results found for this or any previous visit (from the past 240 hour(s)).   Studies: Ct Angio Head W/cm &/or Wo Cm  05/29/2013   CLINICAL DATA:  Third nerve palsy.  Rule out stroke.  EXAM: CT ANGIOGRAPHY HEAD AND NECK  TECHNIQUE: Multidetector CT imaging of the head and neck was performed using the standard protocol during bolus administration of intravenous contrast. Multiplanar CT image reconstructions including MIPs were obtained to evaluate the vascular anatomy. Carotid stenosis measurements (when applicable) are obtained utilizing NASCET  criteria, using the distal internal carotid diameter as the denominator.  CONTRAST:  50mL OMNIPAQUE IOHEXOL 350 MG/ML SOLN  COMPARISON:  CT head 05/29/2013  FINDINGS: CTA HEAD FINDINGS  Postcontrast imaging of the head reveals normal enhancement. Chronic lacunar infarction right putamen. Chronic microvascular ischemic change in the white matter. No acute infarct or hemorrhage.  Both vertebral arteries are patent to the basilar with atherosclerotic calcification and mild stenosis of the distal vertebral artery bilaterally.  Posterior inferior cerebellar artery is patent bilaterally. Basilar artery is widely patent. Superior cerebellar and posterior cerebral arteries are patent bilaterally. Posterior communicating artery is patent bilaterally. Negative for posterior communicating artery aneurysm.  Atherosclerotic calcification of a mild degree in the cavernous carotid bilaterally. Anterior and middle cerebral arteries are patent bilaterally without stenosis.  Negative for cerebral aneurysm.  Review of the MIP images confirms the above findings.  CTA NECK FINDINGS  Proximal great vessels are patent. Mild calcification in the proximal left subclavian artery. Negative for mass or adenopathy in the neck.  Right carotid: Right common carotid artery widely patent. Right carotid bifurcation widely patent without significant atherosclerotic disease or dissection.  Left carotid: Left common carotid artery is widely patent. Mild atherosclerotic calcification in the left carotid bulb without significant stenosis. Negative for carotid dissection  Vertebral arteries: Both vertebral arteries are widely patent to the basilar. There is atherosclerotic calcification and mild stenosis in the distal vertebral artery bilaterally.  Review of the MIP images confirms the above findings.  Postcontrast  IMPRESSION: Negative for cerebral aneurysm.  No acute infarct.  No significant intracranial stenosis.  No significant carotid stenosis. Mild stenosis distal vertebral artery bilaterally.   Electronically Signed   By: Marlan Palau M.D.   On: 05/29/2013 18:20   Dg Chest 2 View  05/29/2013   CLINICAL DATA:  CVA.  EXAM: CHEST - 2 VIEW  COMPARISON:  10/03/2011  FINDINGS: Stable mild cardiomegaly. Stable COPD. No edema, infiltrate or pleural fluid is identified. There is a stable small calcified granuloma in the right lung. Bony structures show stable degenerative changes of the spine and left shoulder.  IMPRESSION: Stable cardiomegaly and COPD.   Electronically  Signed   By: Irish Lack M.D.   On: 05/29/2013 17:05   Ct Head Wo Contrast  05/29/2013   CLINICAL DATA:  Diplopia and nausea  EXAM: CT HEAD WITHOUT CONTRAST  TECHNIQUE: Contiguous axial images were obtained from the base of the skull through the vertex without intravenous contrast. Study was obtained within 24 hr of patient's arrival at the emergency department.  COMPARISON:  None.  FINDINGS: There is mild diffuse atrophy. There is no mass, hemorrhage, extra-axial fluid collection, or midline shift. There is mild patchy small vessel disease in the centra semiovale bilaterally. Small vessel disease is noted in the anterior limbs of both external capsules and in the anterior limb of the right internal capsule. There is evidence of what appears to be a prior infarct in the right putamen anteriorly. Elsewhere, gray-white compartments appear normal. There is no demonstrable acute infarct on this study.  The bony calvarium appears intact.  The mastoid air cells are clear.  IMPRESSION: Atrophy with supratentorial small vessel disease. Age uncertain but probably non acute infarct in the anterior right putamen. No acute infarct is demonstrated on this study. There is no intracranial mass or hemorrhage.   Electronically Signed   By: Bretta Bang M.D.   On: 05/29/2013 13:01   Ct Angio Neck W/cm &/or Wo/cm  05/29/2013  CLINICAL DATA:  Third nerve palsy.  Rule out stroke.  EXAM: CT ANGIOGRAPHY HEAD AND NECK  TECHNIQUE: Multidetector CT imaging of the head and neck was performed using the standard protocol during bolus administration of intravenous contrast. Multiplanar CT image reconstructions including MIPs were obtained to evaluate the vascular anatomy. Carotid stenosis measurements (when applicable) are obtained utilizing NASCET criteria, using the distal internal carotid diameter as the denominator.  CONTRAST:  50mL OMNIPAQUE IOHEXOL 350 MG/ML SOLN  COMPARISON:  CT head 05/29/2013  FINDINGS: CTA HEAD  FINDINGS  Postcontrast imaging of the head reveals normal enhancement. Chronic lacunar infarction right putamen. Chronic microvascular ischemic change in the white matter. No acute infarct or hemorrhage.  Both vertebral arteries are patent to the basilar with atherosclerotic calcification and mild stenosis of the distal vertebral artery bilaterally. Posterior inferior cerebellar artery is patent bilaterally. Basilar artery is widely patent. Superior cerebellar and posterior cerebral arteries are patent bilaterally. Posterior communicating artery is patent bilaterally. Negative for posterior communicating artery aneurysm.  Atherosclerotic calcification of a mild degree in the cavernous carotid bilaterally. Anterior and middle cerebral arteries are patent bilaterally without stenosis.  Negative for cerebral aneurysm.  Review of the MIP images confirms the above findings.  CTA NECK FINDINGS  Proximal great vessels are patent. Mild calcification in the proximal left subclavian artery. Negative for mass or adenopathy in the neck.  Right carotid: Right common carotid artery widely patent. Right carotid bifurcation widely patent without significant atherosclerotic disease or dissection.  Left carotid: Left common carotid artery is widely patent. Mild atherosclerotic calcification in the left carotid bulb without significant stenosis. Negative for carotid dissection  Vertebral arteries: Both vertebral arteries are widely patent to the basilar. There is atherosclerotic calcification and mild stenosis in the distal vertebral artery bilaterally.  Review of the MIP images confirms the above findings.  Postcontrast  IMPRESSION: Negative for cerebral aneurysm.  No acute infarct.  No significant intracranial stenosis.  No significant carotid stenosis. Mild stenosis distal vertebral artery bilaterally.   Electronically Signed   By: Marlan Palau M.D.   On: 05/29/2013 18:20    Scheduled Meds: . aspirin  300 mg Rectal Daily    Or  . aspirin  325 mg Oral Daily  . enoxaparin (LOVENOX) injection  40 mg Subcutaneous Q24H  . furosemide  40 mg Intravenous Daily  . omega-3 acid ethyl esters  2 g Oral BID  . pantoprazole  40 mg Oral Daily  . polyethylene glycol  17 g Oral Daily  . simvastatin  40 mg Oral q morning - 10a  . trimethoprim  100 mg Oral Daily   Continuous Infusions:   Active Problems:   Hypertension   GERD (gastroesophageal reflux disease)   Acute ischemic stroke   Double vision   Leg edema   Third nerve palsy    Time spent: 35 min    Ferguson Gertner  [facility] Pager [phone]. If 7PM-7AM, please contact night-coverage at [email or web address], password [facility] 05/30/2013, 8:15 AM  LOS: 1 day

## 2013-05-30 NOTE — Progress Notes (Signed)
   CARE MANAGEMENT NOTE 05/30/2013  Patient:  Sabrina Alvarez, Sabrina Alvarez   Account Number:  [id number]  Date Initiated:  05/30/2013  Documentation initiated by:  Jiles Crocker  Subjective/Objective Assessment:   ADMITTED WITH CVA     Action/Plan:   CM FOLLOWING FOR DCP   Anticipated DC Date:  06/04/2013   Anticipated DC Plan:  AWAITING ON PT/OT EVALS FOR DISPOSITION      Status of service:  In process, will continue to follow Medicare Important Message given?  NA - LOS <3 / Initial given by admissions (If response is "NO", the following Medicare IM given date fields will be blank)  Per UR Regulation:  Reviewed for med. necessity/level of care/duration of stay  Comments:  12/26/2014Abelino Derrick RN,BSN,MHA [phone]

## 2013-05-31 DIAGNOSIS — E785 Hyperlipidemia, unspecified: Secondary | ICD-10-CM | POA: Diagnosis not present

## 2013-05-31 DIAGNOSIS — H532 Diplopia: Secondary | ICD-10-CM | POA: Diagnosis not present

## 2013-05-31 DIAGNOSIS — I10 Essential (primary) hypertension: Secondary | ICD-10-CM | POA: Diagnosis not present

## 2013-05-31 DIAGNOSIS — K219 Gastro-esophageal reflux disease without esophagitis: Secondary | ICD-10-CM

## 2013-05-31 MED ORDER — ASPIRIN 325 MG PO TABS: 325.0000 mg | ORAL_TABLET | Freq: Every day | ORAL | Status: DC

## 2013-05-31 NOTE — Progress Notes (Signed)
   CARE MANAGEMENT NOTE 05/31/2013  Patient:  Sabrina Alvarez, Sabrina Alvarez   Account Number:  [id number]  Date Initiated:  05/30/2013  Documentation initiated by:  Jiles Crocker  Subjective/Objective Assessment:   ADMITTED WITH CVA     Action/Plan:   CM FOLLOWING FOR DCP   Anticipated DC Date:  06/04/2013   Anticipated DC Plan:  HOME W HOME HEALTH SERVICES      DC Planning Services  CM consult      [facility] Choice  HOME HEALTH   Choice offered to / List presented to:  C-1 Patient   DME arranged  [facility] Hurst      DME agency  [facility]     HH arranged  HH-2 PT  HH-3 OT      [facility] agency  [facility]   Status of service:  In process, will continue to follow Medicare Important Message given?  NA - LOS <3 / Initial given by admissions (If response is "NO", the following Medicare IM given date fields will be blank) Date Medicare IM given:   Date Additional Medicare IM given:    Discharge Disposition:  HOME W HOME HEALTH SERVICES  Per UR Regulation:  Reviewed for med. necessity/level of care/duration of stay  If discussed at Long Length of Stay Meetings, dates discussed:    Comments:  05/31/13 13:15 CM met with pt and son, Tinnie Gens, in room to offer choice.  [facility] chosen to deliver HHPT/OT.  Rolling walker to be delivered to room prior to discharge.  Address verified.  Secondary Contact is Marianita Botkin at [phone].  Referral faxed to [facility] for HHPT/OT.  No other CM needs were communicated.  Freddy Jaksch, BSN, CM [phone].  12/26/2014Abelino Derrick RN,BSN,MHA [phone]

## 2013-05-31 NOTE — Progress Notes (Signed)
Occupational Therapy Note  Written info provided to pt and family, re: visual deficits, use of occlusion, HEP, safety concerns/recommendations and reviewed information with them.  All questions answered.    05/31/13 1300  OT Visit Information  Last OT Received On 05/31/13  Assistance Needed +1  History of Present Illness AHRI OLSON is a 79 y.o. female with past medical history of CAD, hypertension and dyslipidemia came in to the hospital because of double vision. Patient discovered that she has double vision this morning when she was walking to the bathroom around 7 AM, when she went to bed last night she did have no complaints. She does have some dizziness and nausea but no vomiting. She denies weakness, headache, slurred speech or other neurological deficits.  MRI with no acute infarct  OT Time Calculation  OT Start Time 1216  OT Stop Time 1224  OT Time Calculation (min) 8 min  Precautions  Precautions Fall  ADL  ADL Comments Typed information provided to pt and sons re: all information covered in earlier session.  Reviewed all information again including when to use glasses vs. patch, no driving, no climbing.  All questions were answered.   Cognition  Arousal/Alertness Awake/alert  Behavior During Therapy Anxious  Overall Cognitive Status Impaired/Different from baseline  Area of Impairment Attention;Memory;Problem solving  Current Attention Level Alternating;Divided  Memory Decreased short-term memory  Problem Solving Slow processing;Requires verbal cues;Requires tactile cues  OT - End of Session  Activity Tolerance Patient tolerated treatment well  Patient left in bed;with call bell/phone within reach;with family/visitor present  Nurse Communication Mobility status  OT Assessment/Plan  OT Plan Discharge plan remains appropriate  OT Frequency Min 2X/week  Follow Up Recommendations Home health OT;Supervision/Assistance - 24 hour  OT Equipment None recommended by OT  OT Goal  Progression  Progress towards OT goals Progressing toward goals  OT General Charges  $OT Visit 1 Procedure  OT Treatments  $Self Care/Home Management  8-22 mins  [employer], OTR/L [phone]

## 2013-05-31 NOTE — Progress Notes (Signed)
Occupational Therapy Treatment Patient Details Name: Sabrina Alvarez MRN: [id number] DOB: [birth year] Today's Date: 05/31/2013 Time: 4098-1191 OT Time Calculation (min): 112 min  OT Assessment / Plan / Recommendation  History of present illness  Sabrina Alvarez is a 79 y.o. female with past medical history of CAD, hypertension and dyslipidemia came in to the hospital because of double vision. Patient discovered that she has double vision this morning when she was walking to the bathroom around 7 AM, when she went to bed last night she did have no complaints. She does have some dizziness and nausea but no vomiting. She denies weakness, headache, slurred speech or other neurological deficits.  MRI with no acute infarct   OT comments  Pt is performing BADLs at supervision level.  She and sons have been provided with extensive instruction re: visual deficits; occlusion (occluding glasses vs. Patching) including when and how to perform; safety with IADLs, that she needs 24 hour assist, no driving, no climbing ladders, stools, steps, etc. At this time. They have also been instructed in HEP for vision and in need for follow up.  Written information provided   Follow Up Recommendations  Home health OT;Supervision/Assistance - 24 hour    Barriers to Discharge       Equipment Recommendations  None recommended by OT (Neice has a tub seat pt can use)    Recommendations for Other Services    Frequency Min 2X/week   Progress towards OT Goals Progress towards OT goals: Progressing toward goals  Plan Discharge plan remains appropriate    Precautions / Restrictions Precautions Precautions: Fall Restrictions Weight Bearing Restrictions: No   Pertinent Vitals/Pain     ADL  Eating/Feeding: Modified independent Where Assessed - Eating/Feeding: Edge of bed Grooming: Wash/dry hands;Wash/dry face;Teeth care;Brushing hair;Supervision/safety Where Assessed - Grooming: Unsupported standing Upper Body  Bathing: Set up;Supervision/safety Where Assessed - Upper Body Bathing: Unsupported sitting Lower Body Bathing: Supervision/safety Where Assessed - Lower Body Bathing: Supported sit to stand Upper Body Dressing: Set up Where Assessed - Upper Body Dressing: Unsupported sitting Lower Body Dressing: Supervision/safety Where Assessed - Lower Body Dressing: Supported sit to stand Toilet Transfer: Supervision/safety Statistician Method: Sit to stand;Stand pivot Acupuncturist: Comfort height toilet Toileting - Architect and Hygiene: Supervision/safety Where Assessed - Engineer, mining and Hygiene: Standing Equipment Used: Rolling walker Transfers/Ambulation Related to ADLs: supervision with RW ADL Comments:  Pt sitting up in bed with patch occluding Lt. Eye with complaint that she can't read.  She was unable to recall info. Provided yesterday during OT session.  Re-reviewed reason for diplopia, use of occlusion of glasses vs. Patching.  Decided to patch eye when ambulating and to use glasses when she is sitting.  Instructed her to alternate eyes with patch q 2 hours if using patch for extended periods.  Instructed sons how to occlude and modify glasses with transpore tape.  Instructed them in vision exercises and pt able to perform with supervision.  She does demonstrate Rt. Eye adduction to ~25* past midline today, and is able to maintain a conjugate gaze at about 16" from nose today.   Long and extensive conversation with pt re: safety as she has a tendency to climb ladders, fences, etc to complete household chores.  Instructed her to avoid climbing ladders, step stools, chairs, fences, etc.  That she should not be using basement stairs or treadmill until cleared by home PT; no driving.  Pt and sons verbalize understanding of all.  OT Diagnosis:    OT Problem List:   OT Treatment Interventions:     OT Goals(current goals can now be found in the care plan  section)    Visit Information  Last OT Received On: 05/31/13 Assistance Needed: +1 History of Present Illness:  Sabrina Alvarez is a 79 y.o. female with past medical history of CAD, hypertension and dyslipidemia came in to the hospital because of double vision. Patient discovered that she has double vision this morning when she was walking to the bathroom around 7 AM, when she went to bed last night she did have no complaints. She does have some dizziness and nausea but no vomiting. She denies weakness, headache, slurred speech or other neurological deficits.  MRI with no acute infarct    Subjective Data      Prior Functioning       Cognition  Cognition Arousal/Alertness: Awake/alert Behavior During Therapy: Anxious Overall Cognitive Status: Impaired/Different from baseline Area of Impairment: Attention;Memory;Problem solving Current Attention Level: Alternating;Divided Memory: Decreased short-term memory Problem Solving: Slow processing;Requires verbal cues;Requires tactile cues General Comments: Pt unable to recall instruction provided to her yesterday.  Requires information to be repeated.  Requires cues for alternating and divided attention, and cues for problem solving     Mobility  Bed Mobility Bed Mobility: Supine to Sit;Sitting - Scoot to Edge of Bed Supine to Sit: 6: Modified independent (Device/Increase time) Sitting - Scoot to Edge of Bed: 6: Modified independent (Device/Increase time) Transfers Transfers: Sit to Stand;Stand to Sit Sit to Stand: 5: Supervision;With upper extremity assist;From bed Stand to Sit: 5: Supervision;With upper extremity assist;To bed Details for Transfer Assistance: supervision for safety     Exercises      Balance Balance Balance Assessed: Yes Static Standing Balance Static Standing - Balance Support: Bilateral upper extremity supported Static Standing - Level of Assistance: 5: Stand by assistance Dynamic Standing Balance Dynamic  Standing - Balance Support: No upper extremity supported Dynamic Standing - Level of Assistance: 5: Stand by assistance Dynamic Standing - Comments: ADL activities   End of Session OT - End of Session Equipment Utilized During Treatment: Rolling walker Activity Tolerance: Patient tolerated treatment well Patient left: in bed;with call bell/phone within reach;with family/visitor present Nurse Communication: Mobility status  GO     Addisson Frate M 05/31/2013, 1:00 PM

## 2013-05-31 NOTE — Discharge Summary (Addendum)
Physician Discharge Summary  Sabrina Alvarez ZOX:[id number] DOB: 30-Dec-1927 DOA: 05/29/2013  PCP: Lenora Boys, MD  Admit date: 05/29/2013 Discharge date: 05/31/2013  Time spent: 35 minutes  Recommendations for Outpatient Follow-up:  1. Home health: pt/ot 2. Outpatient optho- may need prism glasses  Discharge Diagnoses:  Active Problems:   Hypertension   GERD (gastroesophageal reflux disease)   Double vision   Leg edema   Third nerve palsy   Discharge Condition: improved  Diet recommendation: cardiac  Filed Weights   05/29/13 2014  Weight: 59.784 kg (131 lb 12.8 oz)    History of present illness:  Sabrina Alvarez is a 79 y.o. female with past medical history of CAD, hypertension and dyslipidemia came in to the hospital because of double vision. Patient discovered that she has double vision this morning when she was walking to the bathroom around 7 AM, when she went to bed last night she did have no complaints. She does have some dizziness and nausea but no vomiting. She denies weakness, headache, slurred speech or other neurological deficits.  In the ED CT scan showed no acute infarct in the anterior right putamen, no acute infarcts demonstrated. Patient needs MRI, MRI per ED physician on not be done today because of the Christmas day.   Hospital Course:  Diplopia - 3rd nerve palsy -MRI negative for acute infarct ASA A1c ok lipid panel: LDL 73  -PT/OT- 24 hour supervision at home- spoke with sons who will be able to provide- home health   Hyponatremia  -fluid restrict  -resolved  CAD  -Significant CAD, last cardiac cath on 10/03/2011 showed 100% stenosis of the LAD.  -Circumflex artery showed 35% stenosis with small branches has severe stenosis up to 99%.  -And this is being treated medically with beta blockers and aspirin.   Dyslipidemia  -Patient is on Zocor and fish oral, continued.   Bilateral lower extremity edema  -improved  Procedures: Echo:   Study Conclusions  - Left ventricle: The cavity size was normal. Wall thickness was normal. Systolic function was normal. The estimated ejection fraction was in the range of 60% to 65%. Wall motion was normal; there were no regional wall motion abnormalities. - Aortic valve: Trivial regurgitation.    Consultations:  neuro  Discharge Exam: Filed Vitals:   05/31/13 0522  BP: 152/72  Pulse: 50  Temp: 97.7 F (36.5 C)  Resp:     General: A+Ox3, NAD Cardiovascular: rrr Respiratory: clear  Discharge Instructions  Discharge Orders   Future Orders Complete By Expires   Diet - low sodium heart healthy  As directed    Discharge instructions  As directed    Comments:     No driving Follow up with opthalmology Eye patch as needed Home health- PT/OT Family care 24/7   Increase activity slowly  As directed        Medication List    STOP taking these medications       aspirin 81 MG chewable tablet  Replaced by:  aspirin 325 MG tablet     atenolol 50 MG tablet  Commonly known as:  TENORMIN      TAKE these medications       amLODipine 5 MG tablet  Commonly known as:  NORVASC  Take 1 tablet (5 mg total) by mouth 2 (two) times daily.     aspirin 325 MG tablet  Take 1 tablet (325 mg total) by mouth daily.     BIOTIN 5000 PO  Take  1 capsule by mouth daily.     calcium-vitamin D 250-125 MG-UNIT per tablet  Commonly known as:  OSCAL  Take 1 tablet by mouth daily.     co-enzyme Q-10 30 MG capsule  Take 30 mg by mouth daily. Hold while in hospital     CRANBERRY PLUS VITAMIN C PO  Take 1 tablet by mouth daily.     glucosamine-chondroitin 500-400 MG tablet  Take 1 tablet by mouth daily.     ibuprofen 200 MG tablet  Commonly known as:  ADVIL,MOTRIN  Take 200 mg by mouth every 6 (six) hours as needed.     losartan 25 MG tablet  Commonly known as:  COZAAR  Take 1 tablet (25 mg total) by mouth 2 (two) times daily.     omega-3 acid ethyl esters 1 G capsule   Commonly known as:  LOVAZA  Take 2 g by mouth 2 (two) times daily.     pantoprazole 40 MG tablet  Commonly known as:  PROTONIX  Take 1 tablet (40 mg total) by mouth daily.     simvastatin 40 MG tablet  Commonly known as:  ZOCOR  Take 40 mg by mouth every morning.     trimethoprim 100 MG tablet  Commonly known as:  TRIMPEX  Take 100 mg by mouth daily.     Vitamin D3 1000 UNITS Caps  Take 1 capsule by mouth daily.       Allergies  Allergen Reactions  . Macrobid [Nitrofurantoin Macrocrystal] Other (See Comments)    "I had chills & fever"      The results of significant diagnostics from this hospitalization (including imaging, microbiology, ancillary and laboratory) are listed below for reference.    Significant Diagnostic Studies: Ct Angio Head W/cm &/or Wo Cm  05/29/2013   CLINICAL DATA:  Third nerve palsy.  Rule out stroke.  EXAM: CT ANGIOGRAPHY HEAD AND NECK  TECHNIQUE: Multidetector CT imaging of the head and neck was performed using the standard protocol during bolus administration of intravenous contrast. Multiplanar CT image reconstructions including MIPs were obtained to evaluate the vascular anatomy. Carotid stenosis measurements (when applicable) are obtained utilizing NASCET criteria, using the distal internal carotid diameter as the denominator.  CONTRAST:  50mL OMNIPAQUE IOHEXOL 350 MG/ML SOLN  COMPARISON:  CT head 05/29/2013  FINDINGS: CTA HEAD FINDINGS  Postcontrast imaging of the head reveals normal enhancement. Chronic lacunar infarction right putamen. Chronic microvascular ischemic change in the white matter. No acute infarct or hemorrhage.  Both vertebral arteries are patent to the basilar with atherosclerotic calcification and mild stenosis of the distal vertebral artery bilaterally. Posterior inferior cerebellar artery is patent bilaterally. Basilar artery is widely patent. Superior cerebellar and posterior cerebral arteries are patent bilaterally. Posterior  communicating artery is patent bilaterally. Negative for posterior communicating artery aneurysm.  Atherosclerotic calcification of a mild degree in the cavernous carotid bilaterally. Anterior and middle cerebral arteries are patent bilaterally without stenosis.  Negative for cerebral aneurysm.  Review of the MIP images confirms the above findings.  CTA NECK FINDINGS  Proximal great vessels are patent. Mild calcification in the proximal left subclavian artery. Negative for mass or adenopathy in the neck.  Right carotid: Right common carotid artery widely patent. Right carotid bifurcation widely patent without significant atherosclerotic disease or dissection.  Left carotid: Left common carotid artery is widely patent. Mild atherosclerotic calcification in the left carotid bulb without significant stenosis. Negative for carotid dissection  Vertebral arteries: Both vertebral arteries are widely patent  to the basilar. There is atherosclerotic calcification and mild stenosis in the distal vertebral artery bilaterally.  Review of the MIP images confirms the above findings.  Postcontrast  IMPRESSION: Negative for cerebral aneurysm.  No acute infarct.  No significant intracranial stenosis.  No significant carotid stenosis. Mild stenosis distal vertebral artery bilaterally.   Electronically Signed   By: Marlan Palau M.D.   On: 05/29/2013 18:20   Dg Chest 2 View  05/29/2013   CLINICAL DATA:  CVA.  EXAM: CHEST - 2 VIEW  COMPARISON:  10/03/2011  FINDINGS: Stable mild cardiomegaly. Stable COPD. No edema, infiltrate or pleural fluid is identified. There is a stable small calcified granuloma in the right lung. Bony structures show stable degenerative changes of the spine and left shoulder.  IMPRESSION: Stable cardiomegaly and COPD.   Electronically Signed   By: Irish Lack M.D.   On: 05/29/2013 17:05   Ct Head Wo Contrast  05/29/2013   CLINICAL DATA:  Diplopia and nausea  EXAM: CT HEAD WITHOUT CONTRAST  TECHNIQUE:  Contiguous axial images were obtained from the base of the skull through the vertex without intravenous contrast. Study was obtained within 24 hr of patient's arrival at the emergency department.  COMPARISON:  None.  FINDINGS: There is mild diffuse atrophy. There is no mass, hemorrhage, extra-axial fluid collection, or midline shift. There is mild patchy small vessel disease in the centra semiovale bilaterally. Small vessel disease is noted in the anterior limbs of both external capsules and in the anterior limb of the right internal capsule. There is evidence of what appears to be a prior infarct in the right putamen anteriorly. Elsewhere, gray-white compartments appear normal. There is no demonstrable acute infarct on this study.  The bony calvarium appears intact.  The mastoid air cells are clear.  IMPRESSION: Atrophy with supratentorial small vessel disease. Age uncertain but probably non acute infarct in the anterior right putamen. No acute infarct is demonstrated on this study. There is no intracranial mass or hemorrhage.   Electronically Signed   By: Bretta Bang M.D.   On: 05/29/2013 13:01   Ct Angio Neck W/cm &/or Wo/cm  05/29/2013   CLINICAL DATA:  Third nerve palsy.  Rule out stroke.  EXAM: CT ANGIOGRAPHY HEAD AND NECK  TECHNIQUE: Multidetector CT imaging of the head and neck was performed using the standard protocol during bolus administration of intravenous contrast. Multiplanar CT image reconstructions including MIPs were obtained to evaluate the vascular anatomy. Carotid stenosis measurements (when applicable) are obtained utilizing NASCET criteria, using the distal internal carotid diameter as the denominator.  CONTRAST:  50mL OMNIPAQUE IOHEXOL 350 MG/ML SOLN  COMPARISON:  CT head 05/29/2013  FINDINGS: CTA HEAD FINDINGS  Postcontrast imaging of the head reveals normal enhancement. Chronic lacunar infarction right putamen. Chronic microvascular ischemic change in the white matter. No acute  infarct or hemorrhage.  Both vertebral arteries are patent to the basilar with atherosclerotic calcification and mild stenosis of the distal vertebral artery bilaterally. Posterior inferior cerebellar artery is patent bilaterally. Basilar artery is widely patent. Superior cerebellar and posterior cerebral arteries are patent bilaterally. Posterior communicating artery is patent bilaterally. Negative for posterior communicating artery aneurysm.  Atherosclerotic calcification of a mild degree in the cavernous carotid bilaterally. Anterior and middle cerebral arteries are patent bilaterally without stenosis.  Negative for cerebral aneurysm.  Review of the MIP images confirms the above findings.  CTA NECK FINDINGS  Proximal great vessels are patent. Mild calcification in the proximal left subclavian artery.  Negative for mass or adenopathy in the neck.  Right carotid: Right common carotid artery widely patent. Right carotid bifurcation widely patent without significant atherosclerotic disease or dissection.  Left carotid: Left common carotid artery is widely patent. Mild atherosclerotic calcification in the left carotid bulb without significant stenosis. Negative for carotid dissection  Vertebral arteries: Both vertebral arteries are widely patent to the basilar. There is atherosclerotic calcification and mild stenosis in the distal vertebral artery bilaterally.  Review of the MIP images confirms the above findings.  Postcontrast  IMPRESSION: Negative for cerebral aneurysm.  No acute infarct.  No significant intracranial stenosis.  No significant carotid stenosis. Mild stenosis distal vertebral artery bilaterally.   Electronically Signed   By: Marlan Palau M.D.   On: 05/29/2013 18:20   Mr Laqueta Jean ZO Contrast  05/30/2013   CLINICAL DATA:  Third nerve palsy.  Double vision.  EXAM: MRI HEAD WITHOUT AND WITH CONTRAST  TECHNIQUE: Multiplanar, multiecho pulse sequences of the brain and surrounding structures were  obtained without and with intravenous contrast.  CONTRAST:  10mL MULTIHANCE GADOBENATE DIMEGLUMINE 529 MG/ML IV SOLN  COMPARISON:  CT 05/29/2013  FINDINGS: Generalized atrophy. Chronic microvascular ischemic changes in the white matter and pons. Chronic lacune right putamen.  Negative for acute infarct.  Negative for hemorrhage or mass.  Postcontrast images reveal normal enhancement. No mass lesion. Leptomeningeal enhancement is normal. Cavernous sinus is normal bilaterally. No mass in the orbit is identified.  Paranasal sinuses are clear.  IMPRESSION: Atrophy and chronic microvascular ischemia. No acute infarct or mass.   Electronically Signed   By: Marlan Palau M.D.   On: 05/30/2013 11:06    Microbiology: No results found for this or any previous visit (from the past 240 hour(s)).   Labs: Basic Metabolic Panel:  Recent Labs Lab 05/29/13 1150 05/30/13 0430  NA 133* 131*  K 3.7 4.0  CL 96 97  CO2 25 25  GLUCOSE 144* 91  BUN 15 13  CREATININE 0.69 0.72  CALCIUM 8.8 8.7   Liver Function Tests:  Recent Labs Lab 05/30/13 0430  AST 19  ALT 13  ALKPHOS 49  BILITOT 0.5  PROT 6.0  ALBUMIN 3.5   No results found for this basename: LIPASE, AMYLASE,  in the last 168 hours No results found for this basename: AMMONIA,  in the last 168 hours CBC:  Recent Labs Lab 05/29/13 1150 05/30/13 0430  WBC 7.4 6.0  NEUTROABS 5.8  --   HGB 13.7 12.4  HCT 39.1 36.2  MCV 90.9 90.5  PLT 144* 150   Cardiac Enzymes:  Recent Labs Lab 05/29/13 1150  TROPONINI <0.30   BNP: BNP (last 3 results) No results found for this basename: PROBNP,  in the last 8760 hours CBG:  Recent Labs Lab 05/29/13 1135 05/29/13 1138  GLUCAP 64* 126*       Signed:  Quintana Canelo  [facility] 05/31/2013, 8:30 AM

## 2013-06-03 DIAGNOSIS — Z5189 Encounter for other specified aftercare: Secondary | ICD-10-CM | POA: Diagnosis not present

## 2013-06-03 DIAGNOSIS — I2510 Atherosclerotic heart disease of native coronary artery without angina pectoris: Secondary | ICD-10-CM | POA: Diagnosis not present

## 2013-06-03 DIAGNOSIS — I10 Essential (primary) hypertension: Secondary | ICD-10-CM | POA: Diagnosis not present

## 2013-06-03 DIAGNOSIS — H539 Unspecified visual disturbance: Secondary | ICD-10-CM | POA: Diagnosis not present

## 2013-06-03 DIAGNOSIS — H4900 Third [oculomotor] nerve palsy, unspecified eye: Secondary | ICD-10-CM | POA: Diagnosis not present

## 2013-06-03 DIAGNOSIS — I69998 Other sequelae following unspecified cerebrovascular disease: Secondary | ICD-10-CM | POA: Diagnosis not present

## 2013-06-03 DIAGNOSIS — H532 Diplopia: Secondary | ICD-10-CM | POA: Diagnosis not present

## 2013-06-10 DIAGNOSIS — H4900 Third [oculomotor] nerve palsy, unspecified eye: Secondary | ICD-10-CM | POA: Diagnosis not present

## 2013-06-10 DIAGNOSIS — H532 Diplopia: Secondary | ICD-10-CM | POA: Diagnosis not present

## 2013-06-14 DIAGNOSIS — H539 Unspecified visual disturbance: Secondary | ICD-10-CM | POA: Diagnosis not present

## 2013-06-14 DIAGNOSIS — I69998 Other sequelae following unspecified cerebrovascular disease: Secondary | ICD-10-CM | POA: Diagnosis not present

## 2013-06-14 DIAGNOSIS — Z5189 Encounter for other specified aftercare: Secondary | ICD-10-CM | POA: Diagnosis not present

## 2013-06-14 DIAGNOSIS — I10 Essential (primary) hypertension: Secondary | ICD-10-CM | POA: Diagnosis not present

## 2013-06-14 DIAGNOSIS — H532 Diplopia: Secondary | ICD-10-CM | POA: Diagnosis not present

## 2013-06-14 DIAGNOSIS — H4900 Third [oculomotor] nerve palsy, unspecified eye: Secondary | ICD-10-CM | POA: Diagnosis not present

## 2013-06-19 DIAGNOSIS — H538 Other visual disturbances: Secondary | ICD-10-CM | POA: Diagnosis not present

## 2013-06-19 DIAGNOSIS — H4900 Third [oculomotor] nerve palsy, unspecified eye: Secondary | ICD-10-CM | POA: Diagnosis not present

## 2013-06-19 DIAGNOSIS — H532 Diplopia: Secondary | ICD-10-CM | POA: Diagnosis not present

## 2013-06-21 DIAGNOSIS — I69998 Other sequelae following unspecified cerebrovascular disease: Secondary | ICD-10-CM | POA: Diagnosis not present

## 2013-06-21 DIAGNOSIS — H532 Diplopia: Secondary | ICD-10-CM | POA: Diagnosis not present

## 2013-06-21 DIAGNOSIS — Z5189 Encounter for other specified aftercare: Secondary | ICD-10-CM | POA: Diagnosis not present

## 2013-06-21 DIAGNOSIS — H4900 Third [oculomotor] nerve palsy, unspecified eye: Secondary | ICD-10-CM | POA: Diagnosis not present

## 2013-06-21 DIAGNOSIS — I10 Essential (primary) hypertension: Secondary | ICD-10-CM | POA: Diagnosis not present

## 2013-06-21 DIAGNOSIS — H539 Unspecified visual disturbance: Secondary | ICD-10-CM | POA: Diagnosis not present

## 2013-06-28 DIAGNOSIS — H539 Unspecified visual disturbance: Secondary | ICD-10-CM | POA: Diagnosis not present

## 2013-06-28 DIAGNOSIS — I69998 Other sequelae following unspecified cerebrovascular disease: Secondary | ICD-10-CM | POA: Diagnosis not present

## 2013-06-28 DIAGNOSIS — H4900 Third [oculomotor] nerve palsy, unspecified eye: Secondary | ICD-10-CM | POA: Diagnosis not present

## 2013-06-28 DIAGNOSIS — Z5189 Encounter for other specified aftercare: Secondary | ICD-10-CM | POA: Diagnosis not present

## 2013-06-28 DIAGNOSIS — I10 Essential (primary) hypertension: Secondary | ICD-10-CM | POA: Diagnosis not present

## 2013-06-28 DIAGNOSIS — H532 Diplopia: Secondary | ICD-10-CM | POA: Diagnosis not present

## 2013-07-01 DIAGNOSIS — Z961 Presence of intraocular lens: Secondary | ICD-10-CM | POA: Diagnosis not present

## 2013-07-01 DIAGNOSIS — H538 Other visual disturbances: Secondary | ICD-10-CM | POA: Diagnosis not present

## 2013-07-01 DIAGNOSIS — H1130 Conjunctival hemorrhage, unspecified eye: Secondary | ICD-10-CM | POA: Diagnosis not present

## 2013-07-14 DIAGNOSIS — Z1231 Encounter for screening mammogram for malignant neoplasm of breast: Secondary | ICD-10-CM | POA: Diagnosis not present

## 2013-07-29 DIAGNOSIS — Z01419 Encounter for gynecological examination (general) (routine) without abnormal findings: Secondary | ICD-10-CM | POA: Diagnosis not present

## 2013-07-29 DIAGNOSIS — N318 Other neuromuscular dysfunction of bladder: Secondary | ICD-10-CM | POA: Diagnosis not present

## 2013-07-29 DIAGNOSIS — Z0000 Encounter for general adult medical examination without abnormal findings: Secondary | ICD-10-CM | POA: Diagnosis not present

## 2013-07-29 DIAGNOSIS — M949 Disorder of cartilage, unspecified: Secondary | ICD-10-CM | POA: Diagnosis not present

## 2013-07-29 DIAGNOSIS — N951 Menopausal and female climacteric states: Secondary | ICD-10-CM | POA: Diagnosis not present

## 2013-07-29 DIAGNOSIS — M899 Disorder of bone, unspecified: Secondary | ICD-10-CM | POA: Diagnosis not present

## 2013-08-07 DIAGNOSIS — N318 Other neuromuscular dysfunction of bladder: Secondary | ICD-10-CM | POA: Diagnosis not present

## 2013-08-07 DIAGNOSIS — H6120 Impacted cerumen, unspecified ear: Secondary | ICD-10-CM | POA: Diagnosis not present

## 2013-08-07 DIAGNOSIS — E7800 Pure hypercholesterolemia, unspecified: Secondary | ICD-10-CM | POA: Diagnosis not present

## 2013-08-07 DIAGNOSIS — I10 Essential (primary) hypertension: Secondary | ICD-10-CM | POA: Diagnosis not present

## 2013-08-07 DIAGNOSIS — H4900 Third [oculomotor] nerve palsy, unspecified eye: Secondary | ICD-10-CM | POA: Diagnosis not present

## 2013-09-08 DIAGNOSIS — E785 Hyperlipidemia, unspecified: Secondary | ICD-10-CM | POA: Diagnosis not present

## 2013-09-08 DIAGNOSIS — R0789 Other chest pain: Secondary | ICD-10-CM | POA: Diagnosis not present

## 2013-09-08 DIAGNOSIS — I10 Essential (primary) hypertension: Secondary | ICD-10-CM | POA: Diagnosis not present

## 2013-09-08 DIAGNOSIS — I2510 Atherosclerotic heart disease of native coronary artery without angina pectoris: Secondary | ICD-10-CM | POA: Diagnosis not present

## 2013-09-08 DIAGNOSIS — I452 Bifascicular block: Secondary | ICD-10-CM | POA: Diagnosis not present

## 2013-09-23 DIAGNOSIS — K5900 Constipation, unspecified: Secondary | ICD-10-CM | POA: Diagnosis not present

## 2013-09-30 DIAGNOSIS — H4900 Third [oculomotor] nerve palsy, unspecified eye: Secondary | ICD-10-CM | POA: Diagnosis not present

## 2013-09-30 DIAGNOSIS — H1130 Conjunctival hemorrhage, unspecified eye: Secondary | ICD-10-CM | POA: Diagnosis not present

## 2013-09-30 DIAGNOSIS — H538 Other visual disturbances: Secondary | ICD-10-CM | POA: Diagnosis not present

## 2013-09-30 DIAGNOSIS — Z961 Presence of intraocular lens: Secondary | ICD-10-CM | POA: Diagnosis not present

## 2013-11-17 DIAGNOSIS — N3946 Mixed incontinence: Secondary | ICD-10-CM | POA: Diagnosis not present

## 2013-11-17 DIAGNOSIS — N3020 Other chronic cystitis without hematuria: Secondary | ICD-10-CM | POA: Diagnosis not present

## 2013-11-20 DIAGNOSIS — H26499 Other secondary cataract, unspecified eye: Secondary | ICD-10-CM | POA: Diagnosis not present

## 2013-12-09 DIAGNOSIS — M25559 Pain in unspecified hip: Secondary | ICD-10-CM | POA: Diagnosis not present

## 2013-12-11 DIAGNOSIS — H6120 Impacted cerumen, unspecified ear: Secondary | ICD-10-CM | POA: Diagnosis not present

## 2013-12-19 DIAGNOSIS — M25559 Pain in unspecified hip: Secondary | ICD-10-CM | POA: Diagnosis not present

## 2014-01-06 DIAGNOSIS — M25559 Pain in unspecified hip: Secondary | ICD-10-CM | POA: Diagnosis not present

## 2014-01-19 DIAGNOSIS — H26499 Other secondary cataract, unspecified eye: Secondary | ICD-10-CM | POA: Diagnosis not present

## 2014-01-19 DIAGNOSIS — Z9849 Cataract extraction status, unspecified eye: Secondary | ICD-10-CM | POA: Diagnosis not present

## 2014-01-19 DIAGNOSIS — H43819 Vitreous degeneration, unspecified eye: Secondary | ICD-10-CM | POA: Diagnosis not present

## 2014-01-19 DIAGNOSIS — H11009 Unspecified pterygium of unspecified eye: Secondary | ICD-10-CM | POA: Diagnosis not present

## 2014-01-19 DIAGNOSIS — H5010 Unspecified exotropia: Secondary | ICD-10-CM | POA: Diagnosis not present

## 2014-01-19 DIAGNOSIS — H526 Other disorders of refraction: Secondary | ICD-10-CM | POA: Diagnosis not present

## 2014-01-20 DIAGNOSIS — D692 Other nonthrombocytopenic purpura: Secondary | ICD-10-CM | POA: Diagnosis not present

## 2014-01-20 DIAGNOSIS — L819 Disorder of pigmentation, unspecified: Secondary | ICD-10-CM | POA: Diagnosis not present

## 2014-01-21 DIAGNOSIS — K5900 Constipation, unspecified: Secondary | ICD-10-CM | POA: Diagnosis not present

## 2014-02-05 DIAGNOSIS — I10 Essential (primary) hypertension: Secondary | ICD-10-CM | POA: Diagnosis not present

## 2014-02-05 DIAGNOSIS — R3989 Other symptoms and signs involving the genitourinary system: Secondary | ICD-10-CM | POA: Diagnosis not present

## 2014-02-05 DIAGNOSIS — E7800 Pure hypercholesterolemia, unspecified: Secondary | ICD-10-CM | POA: Diagnosis not present

## 2014-02-05 DIAGNOSIS — Z23 Encounter for immunization: Secondary | ICD-10-CM | POA: Diagnosis not present

## 2014-02-18 DIAGNOSIS — H4900 Third [oculomotor] nerve palsy, unspecified eye: Secondary | ICD-10-CM | POA: Diagnosis not present

## 2014-03-23 DIAGNOSIS — H5022 Vertical strabismus, left eye: Secondary | ICD-10-CM | POA: Diagnosis not present

## 2014-03-23 DIAGNOSIS — H52223 Regular astigmatism, bilateral: Secondary | ICD-10-CM | POA: Diagnosis not present

## 2014-05-07 DIAGNOSIS — R350 Frequency of micturition: Secondary | ICD-10-CM | POA: Diagnosis not present

## 2014-05-07 DIAGNOSIS — N3020 Other chronic cystitis without hematuria: Secondary | ICD-10-CM | POA: Diagnosis not present

## 2014-05-14 ENCOUNTER — Encounter (HOSPITAL_COMMUNITY): Payer: Self-pay | Admitting: Cardiology

## 2014-05-19 DIAGNOSIS — N390 Urinary tract infection, site not specified: Secondary | ICD-10-CM | POA: Diagnosis not present

## 2014-06-16 DIAGNOSIS — R194 Change in bowel habit: Secondary | ICD-10-CM | POA: Diagnosis not present

## 2014-06-19 DIAGNOSIS — N3020 Other chronic cystitis without hematuria: Secondary | ICD-10-CM | POA: Diagnosis not present

## 2014-06-19 DIAGNOSIS — R300 Dysuria: Secondary | ICD-10-CM | POA: Diagnosis not present

## 2014-07-23 DIAGNOSIS — Z1231 Encounter for screening mammogram for malignant neoplasm of breast: Secondary | ICD-10-CM | POA: Diagnosis not present

## 2014-08-03 DIAGNOSIS — E780 Pure hypercholesterolemia: Secondary | ICD-10-CM | POA: Diagnosis not present

## 2014-08-03 DIAGNOSIS — I10 Essential (primary) hypertension: Secondary | ICD-10-CM | POA: Diagnosis not present

## 2014-08-03 DIAGNOSIS — R413 Other amnesia: Secondary | ICD-10-CM | POA: Diagnosis not present

## 2014-08-03 DIAGNOSIS — L659 Nonscarring hair loss, unspecified: Secondary | ICD-10-CM | POA: Diagnosis not present

## 2014-09-15 DIAGNOSIS — E785 Hyperlipidemia, unspecified: Secondary | ICD-10-CM | POA: Diagnosis not present

## 2014-09-15 DIAGNOSIS — I2510 Atherosclerotic heart disease of native coronary artery without angina pectoris: Secondary | ICD-10-CM | POA: Diagnosis not present

## 2014-09-15 DIAGNOSIS — I25119 Atherosclerotic heart disease of native coronary artery with unspecified angina pectoris: Secondary | ICD-10-CM | POA: Diagnosis not present

## 2014-09-15 DIAGNOSIS — I452 Bifascicular block: Secondary | ICD-10-CM | POA: Diagnosis not present

## 2014-09-15 DIAGNOSIS — I119 Hypertensive heart disease without heart failure: Secondary | ICD-10-CM | POA: Diagnosis not present

## 2014-09-28 DIAGNOSIS — R413 Other amnesia: Secondary | ICD-10-CM | POA: Diagnosis not present

## 2014-09-28 DIAGNOSIS — J387 Other diseases of larynx: Secondary | ICD-10-CM | POA: Diagnosis not present

## 2014-09-28 DIAGNOSIS — K5900 Constipation, unspecified: Secondary | ICD-10-CM | POA: Diagnosis not present

## 2014-09-28 DIAGNOSIS — J029 Acute pharyngitis, unspecified: Secondary | ICD-10-CM | POA: Diagnosis not present

## 2014-10-08 ENCOUNTER — Other Ambulatory Visit: Payer: Self-pay | Admitting: Gastroenterology

## 2014-10-08 DIAGNOSIS — R194 Change in bowel habit: Secondary | ICD-10-CM | POA: Diagnosis not present

## 2014-10-08 DIAGNOSIS — J387 Other diseases of larynx: Secondary | ICD-10-CM | POA: Diagnosis not present

## 2014-10-08 DIAGNOSIS — K5901 Slow transit constipation: Secondary | ICD-10-CM | POA: Diagnosis not present

## 2014-10-26 ENCOUNTER — Ambulatory Visit
Admission: RE | Admit: 2014-10-26 | Discharge: 2014-10-26 | Disposition: A | Discharge status: Home / Self Care | Payer: Medicare Other | Source: Ambulatory Visit | Attending: Gastroenterology | Admitting: Gastroenterology

## 2014-10-26 DIAGNOSIS — R194 Change in bowel habit: Secondary | ICD-10-CM

## 2014-10-26 DIAGNOSIS — K562 Volvulus: Secondary | ICD-10-CM | POA: Diagnosis not present

## 2014-10-26 DIAGNOSIS — K5900 Constipation, unspecified: Secondary | ICD-10-CM | POA: Diagnosis not present

## 2014-11-10 ENCOUNTER — Other Ambulatory Visit: Payer: PRIVATE HEALTH INSURANCE

## 2014-11-16 DIAGNOSIS — R413 Other amnesia: Secondary | ICD-10-CM | POA: Diagnosis not present

## 2014-11-16 DIAGNOSIS — E039 Hypothyroidism, unspecified: Secondary | ICD-10-CM | POA: Diagnosis not present

## 2014-11-27 DIAGNOSIS — H26491 Other secondary cataract, right eye: Secondary | ICD-10-CM | POA: Diagnosis not present

## 2014-12-15 DIAGNOSIS — K5901 Slow transit constipation: Secondary | ICD-10-CM | POA: Diagnosis not present

## 2014-12-16 DIAGNOSIS — N3020 Other chronic cystitis without hematuria: Secondary | ICD-10-CM | POA: Diagnosis not present

## 2014-12-16 DIAGNOSIS — R350 Frequency of micturition: Secondary | ICD-10-CM | POA: Diagnosis not present

## 2014-12-28 DIAGNOSIS — E039 Hypothyroidism, unspecified: Secondary | ICD-10-CM | POA: Diagnosis not present

## 2015-01-15 DIAGNOSIS — K529 Noninfective gastroenteritis and colitis, unspecified: Secondary | ICD-10-CM | POA: Diagnosis not present

## 2015-02-11 DIAGNOSIS — E039 Hypothyroidism, unspecified: Secondary | ICD-10-CM | POA: Diagnosis not present

## 2015-02-11 DIAGNOSIS — E871 Hypo-osmolality and hyponatremia: Secondary | ICD-10-CM | POA: Diagnosis not present

## 2015-02-11 DIAGNOSIS — I10 Essential (primary) hypertension: Secondary | ICD-10-CM | POA: Diagnosis not present

## 2015-02-11 DIAGNOSIS — R413 Other amnesia: Secondary | ICD-10-CM | POA: Diagnosis not present

## 2015-03-04 DIAGNOSIS — E039 Hypothyroidism, unspecified: Secondary | ICD-10-CM | POA: Diagnosis not present

## 2015-03-04 DIAGNOSIS — R5383 Other fatigue: Secondary | ICD-10-CM | POA: Diagnosis not present

## 2015-04-15 DIAGNOSIS — K5900 Constipation, unspecified: Secondary | ICD-10-CM | POA: Diagnosis not present

## 2015-04-15 DIAGNOSIS — E079 Disorder of thyroid, unspecified: Secondary | ICD-10-CM | POA: Diagnosis not present

## 2015-04-15 DIAGNOSIS — H9190 Unspecified hearing loss, unspecified ear: Secondary | ICD-10-CM | POA: Diagnosis not present

## 2015-04-15 DIAGNOSIS — Z23 Encounter for immunization: Secondary | ICD-10-CM | POA: Diagnosis not present

## 2015-04-15 DIAGNOSIS — R634 Abnormal weight loss: Secondary | ICD-10-CM | POA: Diagnosis not present

## 2015-04-20 DIAGNOSIS — N939 Abnormal uterine and vaginal bleeding, unspecified: Secondary | ICD-10-CM | POA: Diagnosis not present

## 2015-04-20 DIAGNOSIS — L821 Other seborrheic keratosis: Secondary | ICD-10-CM | POA: Diagnosis not present

## 2015-04-22 DIAGNOSIS — N3946 Mixed incontinence: Secondary | ICD-10-CM | POA: Diagnosis not present

## 2015-04-22 DIAGNOSIS — N3020 Other chronic cystitis without hematuria: Secondary | ICD-10-CM | POA: Diagnosis not present

## 2015-04-26 DIAGNOSIS — H903 Sensorineural hearing loss, bilateral: Secondary | ICD-10-CM | POA: Diagnosis not present

## 2015-05-03 ENCOUNTER — Encounter: Payer: Self-pay | Admitting: Family Medicine

## 2015-05-06 ENCOUNTER — Ambulatory Visit: Payer: Medicare Other | Admitting: Family Medicine

## 2015-05-06 ENCOUNTER — Ambulatory Visit (INDEPENDENT_AMBULATORY_CARE_PROVIDER_SITE_OTHER): Payer: Medicare Other | Admitting: Family Medicine

## 2015-05-06 ENCOUNTER — Encounter: Payer: Self-pay | Admitting: Family Medicine

## 2015-05-06 VITALS — BP 137/75 | HR 60 | Temp 98.3°F | Resp 16 | Ht 64.0 in | Wt 126.0 lb

## 2015-05-06 DIAGNOSIS — I10 Essential (primary) hypertension: Secondary | ICD-10-CM | POA: Diagnosis not present

## 2015-05-06 DIAGNOSIS — I2510 Atherosclerotic heart disease of native coronary artery without angina pectoris: Secondary | ICD-10-CM | POA: Diagnosis not present

## 2015-05-06 DIAGNOSIS — E785 Hyperlipidemia, unspecified: Secondary | ICD-10-CM

## 2015-05-06 DIAGNOSIS — E039 Hypothyroidism, unspecified: Secondary | ICD-10-CM

## 2015-05-06 DIAGNOSIS — N3946 Mixed incontinence: Secondary | ICD-10-CM

## 2015-05-06 DIAGNOSIS — R5383 Other fatigue: Secondary | ICD-10-CM

## 2015-05-06 DIAGNOSIS — R634 Abnormal weight loss: Secondary | ICD-10-CM

## 2015-05-06 NOTE — Progress Notes (Signed)
Office Note 05/06/2015  CC:  Chief Complaint  Patient presents with  . Establish Care  . Follow-up    Pt is not fasting.     HPI:  Sabrina Alvarez is a 81 y.o. White female who is here to establish care, f/u HTN, HLD, hx of CAD, mixed urge/stress urge incontinence. Patient's most recent primary MD: Dr. Maceo Pro at [facility] in [place] (retiring). Old records were reviewed prior to or during today's visit.  Does occ home bp monitoring: all normal. Compliant with statin, denies side effects. No CP or SOB.  Follows up with Dr. Wynonia Lawman annually. Her urologist recently increased her oxybutynin to 5mg  bid.  Reviewed thyroid issues from last 6 mo: she has noted hair falling out more, cold intol (particularly in hands), constipation.  Thyroid studies have been equivocal and trials of both armour thyroid and cytomel have not helped her symptoms.  She is concerned about wt loss.  Says formerly her baseline wt was 130 lbs, and over the last 6-12 mo (difficult to get this info from her) she has dropped as low as 118 (but this was in the midst of a bout of gastroenteritis), but most wts in old records from the last year have been in the 122-126 range.   Most recent o/v with prior PCP her wt was 123.6 lbs (04/20/15).  Past Medical History  Diagnosis Date  . CAD (coronary artery disease)     Cath 2009  Occluded LAD, 40-50% RCA and circ managed medically   . Hypertension   . Hyperlipidemia   . GERD (gastroesophageal reflux disease)     (also LPR) Schatzki's ring, s/p dil '97; food impaction 08/2010, on PPI therapy since w/out further sx so no dil performed  . Hyponatremia 10/03/2011    Na 131 on labs 02/2015 at [facility] ([place])  . Arthritis     "think it's osteo; got some in my hands"  . Bladder infection, chronic 10/03/11    "have had one for over 1 yr"--has been on trimethaprim 100 mg qd since 2014  . Uterine cancer ([place]) 2000  . RBBB with left anterior fascicular block   . 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 Mixed stress and urge urinary incontinence   . Hypothyroidism     per old records: T3 low, free T4 elevated, TSH normal.  Armour thyroid made her feel worse and T4 went up so med d/c'd and liothyronine 5 mcg tried but also made pt feel worse.  . Chronic constipation     slow transit.  Barium enema to eval for colonic stricture 10/2014 was NORMAL  . Thrombocytopenia ([place]) 02/2013    Plts 114K    Past Surgical History  Procedure Laterality Date  . Dilation and curettage of uterus    . Cesarean section  [id number]; 1960  . Abdominal hysterectomy  2000  . Hernia repair      abdominal "twice"  . Appendectomy  2000  . Tonsillectomy      as a child  . Cataract extraction w/ intraocular lens  implant, bilateral  1990's  . Repair knee ligament  ~ 2008    left  . Left heart catheterization with coronary angiogram N/A 10/04/2011    Procedure: LEFT HEART CATHETERIZATION WITH CORONARY ANGIOGRAM;  Surgeon: Jacolyn Reedy, MD;  Location: [facility] CATH LAB;  Service: Cardiovascular;  Laterality: N/A;  .  Colonoscopy  04/2001    Neg; pt cancelled screening colon 06/2011  . Transthoracic echocardiogram  05/2013    EF 60-65%, trivial aortic regurg, no wall motion abnorm    Family History  Problem Relation Age of Onset  . Arthritis Mother   . Diabetes Sister   . Cancer Neg Hx   . Heart disease Neg Hx   . Rheum arthritis Sister     Social History   Social History  . Marital Status: Widowed    Spouse Name: N/A  . Number of Children: N/A  . Years of Education: N/A   Occupational History  . Not on file.   Social History Main Topics  . Smoking status: Never Smoker   . Smokeless tobacco: Never Used  . Alcohol Use: No  . Drug Use: No  . Sexual Activity: No   Other Topics Concern  . Not on file   Social History Narrative   Widow, lives alone.  Three sons, one deceased.   Orig  from [facility].     Educ: HS.   Occup: retired.  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   No T/A/Ds.    Outpatient Encounter Prescriptions as of 05/06/2015  Medication Sig  . amLODipine (NORVASC) 5 MG tablet Take 1 tablet (5 mg total) by mouth 2 (two) times daily.  Marland Kitchen aspirin 325 MG tablet Take 1 tablet (325 mg total) by mouth daily.  Marland Kitchen BIOTIN 5000 PO Take 1 capsule by mouth daily.  . cetirizine (ZYRTEC) 10 MG tablet Take 10 mg by mouth daily.  . Cholecalciferol (VITAMIN D3) 1000 UNITS CAPS Take 1 capsule by mouth daily.  Marland Kitchen co-enzyme Q-10 30 MG capsule Take 30 mg by mouth daily. Hold while in hospital  . Ginkgo Biloba 120 MG CAPS Take 1 capsule by mouth 3 (three) times daily.  Marland Kitchen glucosamine-chondroitin 500-400 MG tablet Take 1 tablet by mouth daily.  Marland Kitchen ibuprofen (ADVIL,MOTRIN) 200 MG tablet Take 200 mg by mouth every 6 (six) hours as needed.  Marland Kitchen losartan (COZAAR) 25 MG tablet Take 1 tablet (25 mg total) by mouth 2 (two) times daily.  Marland Kitchen omega-3 acid ethyl esters (LOVAZA) 1 G capsule Take 2 g by mouth 2 (two) times daily.  Marland Kitchen oxybutynin (DITROPAN) 5 MG tablet Take 5 mg by mouth daily.  . pantoprazole (PROTONIX) 40 MG tablet Take 1 tablet (40 mg total) by mouth daily.  . polyethylene glycol (MIRALAX / GLYCOLAX) packet Take 17 g by mouth daily.  . simvastatin (ZOCOR) 40 MG tablet Take 40 mg by mouth every morning.   . trimethoprim (TRIMPEX) 100 MG tablet Take 100 mg by mouth daily.  . [DISCONTINUED] calcium-vitamin D (OSCAL) 250-125 MG-UNIT per tablet Take 1 tablet by mouth daily.  . [DISCONTINUED] Cranberry-Vitamin C-Vitamin E (CRANBERRY PLUS VITAMIN C PO) Take 1 tablet by mouth daily.   No facility-administered encounter medications on file as of 05/06/2015.    Allergies  Allergen Reactions  . Macrobid [Nitrofurantoin Macrocrystal] Other (See Comments)    "I had chills & fever"    ROS Review of Systems  Constitutional: Negative for fever and fatigue.   HENT: Negative for congestion and sore throat.   Eyes: Negative for visual disturbance.  Respiratory: Negative for cough.   Cardiovascular: Negative for chest pain.  Gastrointestinal: Negative for nausea and abdominal pain.  Genitourinary: Negative for dysuria.  Musculoskeletal: Negative for back pain and joint swelling.  Skin: Negative for rash.  Neurological: Negative for weakness and headaches.  Hematological: Negative for adenopathy.    PE; Blood pressure 137/75, pulse 60, temperature 98.3 F (36.8 C), temperature source Oral, resp. rate 16, height 5\' 4"  (1.626 m), weight 126 lb (57.153 kg), SpO2 95 %. Gen: Alert, well appearing.  Patient is oriented to person, place, time, and situation. [id number]: no injection, icteris, swelling, or exudate.  EOMI, PERRLA. Mouth: lips without lesion/swelling.  Oral mucosa pink and moist. Oropharynx without erythema, exudate, or swelling.  CV: RRR, 1/6 systolic murmur at cardiac base, no r/g.  No diastolic murmur. LUNGS: CTA bilat, nonlabored resps, good aeration in all lung fields. ABD: soft, NT/ND EXT: 1+ pitting edema in both ankles, R a bit worse than L.  Pertinent labs:  None today  ASSESSMENT AND PLAN:   New pt; prior PCP records reviewed.  1) HTN; The current medical regimen is effective;  continue present plan and medications.  2) Hyperlipidemia: due for FLP monitoring.  She will return for lab visit when fasting.  3) Abnormal thyroid tests: TSH has been normal.  T3 very slightly low, free T4 normal. With armour thyroid trial her free T4 became a little elevated. With cytomel trial 03/2015 she felt bad so she d/c'd the med and her labs have not been repeated since. Will repeat TSH, free T4, and T3 total.  4) Urinary urge/stress incontinence: urologist increased her oxybutynin to 5mg  bid but she has not yet started this dosing.  5) Wt loss, unintentional.  NO red flags.  I think she is simply restricting her caloric intake too  much in attempt to eat a heart healthy diet.  Reassured her.  Encouraged her to eat whatever she wants at this time and we'll see how her wt responds.    6) Hx of CAD: asymptomatic with current medical mgmt. Keep annual f/u with Dr. Wynonia Lawman.  An After Visit Summary was printed and given to the patient.  Return in about 1 month (around 06/06/2015) for 30 min ov for RCI.

## 2015-05-06 NOTE — Progress Notes (Signed)
Pre visit review using our clinic review tool, if applicable. No additional management support is needed unless otherwise documented below in the visit note. 

## 2015-05-10 ENCOUNTER — Other Ambulatory Visit (INDEPENDENT_AMBULATORY_CARE_PROVIDER_SITE_OTHER): Payer: Medicare Other

## 2015-05-10 DIAGNOSIS — I10 Essential (primary) hypertension: Secondary | ICD-10-CM

## 2015-05-10 DIAGNOSIS — E785 Hyperlipidemia, unspecified: Secondary | ICD-10-CM | POA: Diagnosis not present

## 2015-05-10 DIAGNOSIS — E039 Hypothyroidism, unspecified: Secondary | ICD-10-CM | POA: Diagnosis not present

## 2015-05-10 DIAGNOSIS — R5383 Other fatigue: Secondary | ICD-10-CM | POA: Diagnosis not present

## 2015-05-10 LAB — CBC WITH DIFFERENTIAL/PLATELET
BASOS PCT: 0.5 % (ref 0.0–3.0)
Basophils Absolute: 0 10*3/uL (ref 0.0–0.1)
EOS PCT: 2.7 % (ref 0.0–5.0)
Eosinophils Absolute: 0.1 10*3/uL (ref 0.0–0.7)
HEMATOCRIT: 38.3 % (ref 36.0–46.0)
HEMOGLOBIN: 12.7 g/dL (ref 12.0–15.0)
Lymphocytes Relative: 29.4 % (ref 12.0–46.0)
Lymphs Abs: 1.3 10*3/uL (ref 0.7–4.0)
MCHC: 33.3 g/dL (ref 30.0–36.0)
MCV: 90 fl (ref 78.0–100.0)
MONOS PCT: 7.7 % (ref 3.0–12.0)
Monocytes Absolute: 0.3 10*3/uL (ref 0.1–1.0)
Neutro Abs: 2.6 10*3/uL (ref 1.4–7.7)
Neutrophils Relative %: 59.7 % (ref 43.0–77.0)
PLATELETS: 168 10*3/uL (ref 150.0–400.0)
RBC: 4.25 Mil/uL (ref 3.87–5.11)
RDW: 14.2 % (ref 11.5–15.5)
WBC: 4.4 10*3/uL (ref 4.0–10.5)

## 2015-05-10 LAB — COMPREHENSIVE METABOLIC PANEL
ALBUMIN: 3.9 g/dL (ref 3.5–5.2)
ALT: 12 U/L (ref 0–35)
AST: 17 U/L (ref 0–37)
Alkaline Phosphatase: 56 U/L (ref 39–117)
BUN: 14 mg/dL (ref 6–23)
CALCIUM: 8.8 mg/dL (ref 8.4–10.5)
CO2: 28 mEq/L (ref 19–32)
CREATININE: 0.76 mg/dL (ref 0.40–1.20)
Chloride: 99 mEq/L (ref 96–112)
GFR: 76.75 mL/min (ref >60.00)
Glucose, Bld: 100 mg/dL — ABNORMAL HIGH (ref 70–99)
Potassium: 4.5 mEq/L (ref 3.5–5.1)
Sodium: 133 mEq/L — ABNORMAL LOW (ref 135–145)
Total Bilirubin: 0.5 mg/dL (ref 0.2–1.2)
Total Protein: 6.2 g/dL (ref 6.0–8.3)

## 2015-05-10 LAB — LIPID PANEL
CHOLESTEROL: 173 mg/dL (ref 0–200)
HDL: 56.1 mg/dL (ref >39.00)
LDL Cholesterol: 107 mg/dL — ABNORMAL HIGH (ref 0–99)
NonHDL: 116.94
TRIGLYCERIDES: 50 mg/dL (ref 0.0–149.0)
Total CHOL/HDL Ratio: 3
VLDL: 10 mg/dL (ref 0.0–40.0)

## 2015-05-10 LAB — TSH: TSH: 2.15 u[IU]/mL (ref 0.35–4.50)

## 2015-05-10 LAB — T4, FREE: Free T4: 2.25 ng/dL — ABNORMAL HIGH (ref 0.60–1.60)

## 2015-05-10 LAB — T3: T3, Total: 78.4 ng/dL — ABNORMAL LOW (ref 80.0–204.0)

## 2015-05-18 ENCOUNTER — Other Ambulatory Visit: Payer: Self-pay | Admitting: *Deleted

## 2015-05-18 DIAGNOSIS — R946 Abnormal results of thyroid function studies: Secondary | ICD-10-CM

## 2015-05-26 ENCOUNTER — Encounter: Payer: Self-pay | Admitting: Endocrinology

## 2015-05-26 ENCOUNTER — Ambulatory Visit (INDEPENDENT_AMBULATORY_CARE_PROVIDER_SITE_OTHER): Payer: Medicare Other | Admitting: Endocrinology

## 2015-05-26 VITALS — BP 126/74 | HR 54 | Temp 97.7°F | Wt 123.0 lb

## 2015-05-26 DIAGNOSIS — I2510 Atherosclerotic heart disease of native coronary artery without angina pectoris: Secondary | ICD-10-CM | POA: Diagnosis not present

## 2015-05-26 DIAGNOSIS — E079 Disorder of thyroid, unspecified: Secondary | ICD-10-CM | POA: Diagnosis not present

## 2015-05-26 DIAGNOSIS — E039 Hypothyroidism, unspecified: Secondary | ICD-10-CM | POA: Insufficient documentation

## 2015-05-26 NOTE — Progress Notes (Signed)
Subjective:    Patient ID: Sabrina Alvarez, female    DOB: 1927-08-02, 81 y.o.   MRN: [id number]  HPI Pt reports he was dx'ed with a thyroid problem in mid-2016.  she started taking armour thyroid, which she took for a few months.  She has been off it for a few months.  she has never had XRT to the anterior neck, or thyroid surgery.  she has never had thyroid imaging.  she does not consume kelp or any other prescribed or non-prescribed thyroid medication.  He has never been on amiodarone.  She has moderate cold intolerance of the hands, and assoc hair loss (for which she takes biotin) Past Medical History  Diagnosis Date  . CAD (coronary artery disease)     Cath 2009  Occluded LAD, 40-50% RCA and circ managed medically   . Hypertension   . Hyperlipidemia   . GERD (gastroesophageal reflux disease)     (also LPR) Schatzki's ring, s/p dil '97; food impaction 08/2010, on PPI therapy since w/out further sx so no dil performed  . Hyponatremia 10/03/2011    Na 131 on labs 02/2015 at [facility]  . Arthritis     "think it's osteo; got some in my hands"  . Bladder infection, chronic 10/03/11    "have had one for over 1 yr"--has been on trimethaprim 100 mg qd since 2014  . Uterine cancer ([place]) 2000  . RBBB with left anterior fascicular block   . Third nerve palsy 05/2013    presented as diplopia; felt by neuro to be microvascular insult so no carotid dopplers needed (CT and MRI neg for CVA)  . Mixed stress and urge urinary incontinence   . Hypothyroidism     per old records: T3 low, free T4 elevated, TSH normal.  Armour thyroid made her feel worse and T4 went up so med d/c'd and liothyronine 5 mcg tried but also made pt feel worse.  . Chronic constipation     slow transit.  Barium enema to eval for colonic stricture 10/2014 was NORMAL  . Thrombocytopenia ([place]) 02/2013    Plts 114K    Past Surgical History  Procedure Laterality Date  . Dilation and curettage of uterus    . Cesarean section  [id number];  1960  . Abdominal hysterectomy  2000  . Hernia repair      abdominal "twice"  . Appendectomy  2000  . Tonsillectomy      as a child  . Cataract extraction w/ intraocular lens  implant, bilateral  1990's  . Repair knee ligament  ~ 2008    left  . Left heart catheterization with coronary angiogram N/A 10/04/2011    Procedure: LEFT HEART CATHETERIZATION WITH CORONARY ANGIOGRAM;  Surgeon: Jacolyn Reedy, MD;  Location: [facility] CATH LAB;  Service: Cardiovascular;  Laterality: N/A;  . Colonoscopy  04/2001    Neg; pt cancelled screening colon 06/2011  . Transthoracic echocardiogram  05/2013    EF 60-65%, trivial aortic regurg, no wall motion abnorm    Social History   Social History  . Marital Status: Widowed    Spouse Name: N/A  . Number of Children: N/A  . Years of Education: N/A   Occupational History  . Not on file.   Social History Main Topics  . Smoking status: Never Smoker   . Smokeless tobacco: Never Used  . Alcohol Use: No  . Drug Use: No  . Sexual Activity: No   Other Topics Concern  .  Not on file   Social History Narrative   Widow, lives alone.  Three sons, one deceased.   Orig from [facility]     Educ: HS.   Occup: retired.  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   No T/A/Ds.    Current Outpatient Prescriptions on File Prior to Visit  Medication Sig Dispense Refill  . amLODipine (NORVASC) 5 MG tablet Take 1 tablet (5 mg total) by mouth 2 (two) times daily.    Marland Kitchen aspirin 325 MG tablet Take 1 tablet (325 mg total) by mouth daily.    . cetirizine (ZYRTEC) 10 MG tablet Take 10 mg by mouth daily.    . Cholecalciferol (VITAMIN D3) 1000 UNITS CAPS Take 1 capsule by mouth daily.    Marland Kitchen co-enzyme Q-10 30 MG capsule Take 30 mg by mouth daily. Hold while in hospital    . Ginkgo Biloba 120 MG CAPS Take 1 capsule by mouth 3 (three) times daily.    Marland Kitchen glucosamine-chondroitin 500-400 MG tablet Take 1 tablet by mouth daily.    Marland Kitchen ibuprofen  (ADVIL,MOTRIN) 200 MG tablet Take 200 mg by mouth every 6 (six) hours as needed.    Marland Kitchen losartan (COZAAR) 25 MG tablet Take 1 tablet (25 mg total) by mouth 2 (two) times daily.    Marland Kitchen omega-3 acid ethyl esters (LOVAZA) 1 G capsule Take 2 g by mouth 2 (two) times daily.    Marland Kitchen oxybutynin (DITROPAN) 5 MG tablet Take 5 mg by mouth daily.    . pantoprazole (PROTONIX) 40 MG tablet Take 1 tablet (40 mg total) by mouth daily.    . polyethylene glycol (MIRALAX / GLYCOLAX) packet Take 17 g by mouth daily.    . simvastatin (ZOCOR) 40 MG tablet Take 40 mg by mouth every morning.     . trimethoprim (TRIMPEX) 100 MG tablet Take 100 mg by mouth daily.     No current facility-administered medications on file prior to visit.    Allergies  Allergen Reactions  . Macrobid [Nitrofurantoin Macrocrystal] Other (See Comments)    "I had chills & fever"    Family History  Problem Relation Age of Onset  . Arthritis Mother   . Diabetes Sister   . Cancer Neg Hx   . Heart disease Neg Hx   . Rheum arthritis Sister   . Thyroid disease Neg Hx     BP 126/74 mmHg  Pulse 54  Temp(Src) 97.7 F (36.5 C) (Oral)  Wt 123 lb (55.792 kg)  SpO2 97%  Review of Systems denies depression, muscle cramps, sob, numbness, blurry vision, menopausal sxs, myalgias, rhinorrhea, easy bruising, and syncope.  She has lost a few lbs.  She has constipation, easy bruising, and dry skin.     Objective:   Physical Exam VS: see vs page GEN: no distress HEAD: head: no deformity eyes: no periorbital swelling, no proptosis external nose and ears are normal mouth: no lesion seen NECK: supple, thyroid is not enlarged CHEST WALL: no deformity LUNGS:  Clear to auscultation CV: reg rate and rhythm, no murmur ABD: abdomen is soft, nontender.  no hepatosplenomegaly.  not distended.  no hernia MUSCULOSKELETAL: muscle bulk and strength are grossly normal.  no obvious joint swelling.  gait is normal and steady EXTEMITIES: no deformity.  no  edema PULSES: no carotid bruit NEURO:  cn 2-12 grossly intact.   readily moves all 4's.  sensation is intact to touch on all 4's.  No tremor SKIN:  Normal texture  and temperature.  No rash or suspicious lesion is visible.  Not diaphoretic. NODES:  None palpable at the neck PSYCH: alert, well-oriented.  Does not appear anxious nor depressed.  Lab Results  Component Value Date   TSH 2.15 05/10/2015   T3TOTAL 78.4* 05/10/2015  free T4 is high, and free T3 is low  I have reviewed outside records, and summarized: Pt was noted to have abnormal TFT, and referred here.     Assessment & Plan:  Thyroid hormone abnormalities: new to me: the assay is prob affected by biotin, and she probably has no thyroid problem. Dry skin, and other sxs, unlikely thyroid-related  Patient is advised the following: Patient Instructions  The biotin is affecting your thyroid blood tests, so please stop it and redo the blood tests in 2 weeks.   I would be happy to see you back here as necessary.

## 2015-05-26 NOTE — Patient Instructions (Addendum)
The biotin is affecting your thyroid blood tests, so please stop it and redo the blood tests in 2 weeks.   I would be happy to see you back here as necessary.

## 2015-06-06 DIAGNOSIS — Z8619 Personal history of other infectious and parasitic diseases: Secondary | ICD-10-CM

## 2015-06-06 HISTORY — DX: Personal history of other infectious and parasitic diseases: Z86.19

## 2015-06-10 ENCOUNTER — Encounter: Payer: Self-pay | Admitting: Family Medicine

## 2015-06-10 ENCOUNTER — Ambulatory Visit (INDEPENDENT_AMBULATORY_CARE_PROVIDER_SITE_OTHER): Payer: Medicare Other | Admitting: Family Medicine

## 2015-06-10 VITALS — BP 129/70 | HR 60 | Temp 98.6°F | Resp 16 | Ht 64.0 in | Wt 122.5 lb

## 2015-06-10 DIAGNOSIS — F09 Unspecified mental disorder due to known physiological condition: Secondary | ICD-10-CM | POA: Diagnosis not present

## 2015-06-10 DIAGNOSIS — R413 Other amnesia: Secondary | ICD-10-CM

## 2015-06-10 DIAGNOSIS — K219 Gastro-esophageal reflux disease without esophagitis: Secondary | ICD-10-CM

## 2015-06-10 DIAGNOSIS — R0982 Postnasal drip: Secondary | ICD-10-CM

## 2015-06-10 DIAGNOSIS — E079 Disorder of thyroid, unspecified: Secondary | ICD-10-CM

## 2015-06-10 LAB — TSH: TSH: 2.09 u[IU]/mL (ref 0.35–4.50)

## 2015-06-10 LAB — T3, FREE: T3, Free: 3.1 pg/mL (ref 2.3–4.2)

## 2015-06-10 LAB — T4, FREE: FREE T4: 1 ng/dL (ref 0.60–1.60)

## 2015-06-10 MED ORDER — FEXOFENADINE HCL 180 MG PO TABS: 180.0000 mg | ORAL_TABLET | Freq: Every day | ORAL | Status: DC

## 2015-06-10 NOTE — Progress Notes (Signed)
OFFICE VISIT  06/13/2015   CC:  Chief Complaint  Patient presents with  . Follow-up    Pt is not fasting.     HPI:    Patient is a 82 y.o. Caucasian female who presents for 1 mo f/u abnormal thyroid tests. Since I last saw her she saw Dr. Loanne Drilling in Endo and he felt like the Biotin she was taking was making her TFT's abnormal.  He recommended she d/c biotin and recheck TFT's in 2 wks. All labs done last visit other than her TFT's were great.  She has stopped the Biotin since 05/26/15. She still doesn't understand any of this.  Repeats some of the same questions over and over even after I explain things.  Has been reading a magazine that talks about sx's of hypothyroidism and she says "I have all of them", says hair is falling out, feels she is sensitive to cold mainly in hands, intermittently has constipation, feels depressed sometimes, dry skin.  Says she is still very worried about abnormal wt loss--which she fails to understand is not a symptoms of hypothyroidism--and doesn't understand my logic when I explain that when I review her recorded wts from old records and recent office visits her and with Dr. Loanne Drilling, her wt loss is not abnormal. Appetite is good, pt states she eats well.    Depressed mood is significant at times but I can't get her to stay on any topic very long today--she keeps perseverating about her thyroid tests/questions/, also memory problems.  Lives alone.  Tries to keep busy.  She is everywhere with her talk today, moves to chronic clearing of the throat now, says it has been going on "forever".  Asks what I can give her to make it better.  This is after spending 20 minutes with the thyroid lab/symptoms/mood/memory/wt concerns.  She is very tough to reassure, seems to WANT something to be wrong with her so it can be "fixed".      Past Medical History  Diagnosis Date  . CAD (coronary artery disease)     Cath 2009  Occluded LAD, 40-50% RCA and circ managed medically    . Hypertension   . Hyperlipidemia   . GERD (gastroesophageal reflux disease)     (also LPR) Schatzki's ring, s/p dil '97; food impaction 08/2010, on PPI therapy since w/out further sx so no dil performed  . Hyponatremia 10/03/2011    Na 131 on labs 02/2015 at [facility]  . Arthritis     "think it's osteo; got some in my hands"  . Bladder infection, chronic 10/03/11    "have had one for over 1 yr"--has been on trimethaprim 100 mg qd since 2014  . Uterine cancer ([place]) 2000  . RBBB with left anterior fascicular block   . Third nerve palsy 05/2013    presented as diplopia; felt by neuro to be microvascular insult so no carotid dopplers needed (CT and MRI neg for CVA)  . Mixed stress and urge urinary incontinence   . Hypothyroidism     per old records: T3 low, free T4 elevated, TSH normal.  Armour thyroid made her feel worse and T4 went up so med d/c'd and liothyronine 5 mcg tried but also made pt feel worse.  . Chronic constipation     slow transit.  Barium enema to eval for colonic stricture 10/2014 was NORMAL  . Thrombocytopenia ([place]) 02/2013    Plts 114K    Past Surgical History  Procedure Laterality Date  .  Dilation and curettage of uterus    . Cesarean section  [id number]; 1960  . Abdominal hysterectomy  2000  . Hernia repair      abdominal "twice"  . Appendectomy  2000  . Tonsillectomy      as a child  . Cataract extraction w/ intraocular lens  implant, bilateral  1990's  . Repair knee ligament  ~ 2008    left  . Left heart catheterization with coronary angiogram N/A 10/04/2011    Procedure: LEFT HEART CATHETERIZATION WITH CORONARY ANGIOGRAM;  Surgeon: Jacolyn Reedy, MD;  Location: [facility] CATH LAB;  Service: Cardiovascular;  Laterality: N/A;  . Colonoscopy  04/2001    Neg; pt cancelled screening colon 06/2011  . Transthoracic echocardiogram  05/2013    EF 60-65%, trivial aortic regurg, no wall motion abnorm    Outpatient Prescriptions Prior to Visit  Medication Sig Dispense Refill  .  amLODipine (NORVASC) 5 MG tablet Take 1 tablet (5 mg total) by mouth 2 (two) times daily.    Marland Kitchen aspirin 325 MG tablet Take 1 tablet (325 mg total) by mouth daily.    . Cholecalciferol (VITAMIN D3) 1000 UNITS CAPS Take 1 capsule by mouth daily.    Marland Kitchen co-enzyme Q-10 30 MG capsule Take 30 mg by mouth daily. Hold while in hospital    . Ginkgo Biloba 120 MG CAPS Take 1 capsule by mouth 3 (three) times daily.    Marland Kitchen glucosamine-chondroitin 500-400 MG tablet Take 1 tablet by mouth daily.    Marland Kitchen ibuprofen (ADVIL,MOTRIN) 200 MG tablet Take 200 mg by mouth every 6 (six) hours as needed.    Marland Kitchen losartan (COZAAR) 25 MG tablet Take 1 tablet (25 mg total) by mouth 2 (two) times daily.    Marland Kitchen omega-3 acid ethyl esters (LOVAZA) 1 G capsule Take 2 g by mouth 2 (two) times daily.    Marland Kitchen oxybutynin (DITROPAN) 5 MG tablet Take 5 mg by mouth daily.    . pantoprazole (PROTONIX) 40 MG tablet Take 1 tablet (40 mg total) by mouth daily.    . polyethylene glycol (MIRALAX / GLYCOLAX) packet Take 17 g by mouth daily.    . simvastatin (ZOCOR) 40 MG tablet Take 40 mg by mouth every morning.     . trimethoprim (TRIMPEX) 100 MG tablet Take 100 mg by mouth daily.    . cetirizine (ZYRTEC) 10 MG tablet Take 10 mg by mouth daily.     No facility-administered medications prior to visit.    Allergies  Allergen Reactions  . Macrobid [Nitrofurantoin Macrocrystal] Other (See Comments)    "I had chills & fever"    ROS As per HPI  PE: Blood pressure 129/70, pulse 60, temperature 98.6 F (37 C), temperature source Oral, resp. rate 16, height 5\' 4"  (1.626 m), weight 122 lb 8 oz (55.566 kg), SpO2 99 %. Gen: Alert, well appearing.  Patient is oriented to person, place, time, and situation. HEAD: sparse hair loss all over scalp but nothing over and above what I feel is normal for age. Neck - No masses or thyromegaly or limitation in range of motion CV: RRR, 2/6 syst murmur best heard at LUSB, no diastolic murmur.  No rub or gallop.   Chest  is clear, no wheezing or rales. Normal symmetric air entry throughout both lung fields. No chest wall deformities or tenderness. Marland Kitchen  LABS:  Lab Results  Component Value Date   TSH 2.09 06/10/2015  On 05/10/15 her T3 total was 78.4 (slightly low) and  her free T4 was 2.25 (slightly high).  Lab Results  Component Value Date   WBC 4.4 05/10/2015   HGB 12.7 05/10/2015   HCT 38.3 05/10/2015   MCV 90.0 05/10/2015   PLT 168.0 05/10/2015   Lab Results  Component Value Date   CREATININE 0.76 05/10/2015   BUN 14 05/10/2015   NA 133* 05/10/2015   K 4.5 05/10/2015   CL 99 05/10/2015   CO2 28 05/10/2015   Lab Results  Component Value Date   ALT 12 05/10/2015   AST 17 05/10/2015   ALKPHOS 56 05/10/2015   BILITOT 0.5 05/10/2015   Lab Results  Component Value Date   CHOL 173 05/10/2015   Lab Results  Component Value Date   HDL 56.10 05/10/2015   Lab Results  Component Value Date   LDLCALC 107* 05/10/2015   Lab Results  Component Value Date   TRIG 50.0 05/10/2015   Lab Results  Component Value Date   CHOLHDL 3 05/10/2015   Lab Results  Component Value Date   HGBA1C 5.8* 05/30/2013   IMPRESSION AND PLAN:  1) Abnormal thyroid tests: TSH normal, slightly low T3, slightly high T4. Per endo, Biotin is causing these abnormal tests.  No thyroid dz is present at all. Tried to reassure pt.  Repeat tests today (she has been off biotin x 2 wks per her report) as per endo rec's.  2) Wt loss; I don't think she has any pattern c/w "abnormal" wt loss.  I tried to convince her of this today and told her I would continue to keep an eye on things and w/u this issue if it becomes a true abnormal wt loss issue.    3) Memory loss/cognitive dysfunction/mood issues: I need to dedicate a full appointment to just this issue so I can determine what steps to take (antidepressant?  aricept trial?  Neuro referral?  Brain imaging?Marland Kitchen  4) Chronic pharyngeal mucous stagnation: optimize antihist and  PPI. She has been on her PPI daily but gave up on zyrtec b/c no help. Will try daily allegra 180mg  qd.  I don't know if we can get this much better.  Spent 30 min with pt today, with >50% of this time spent in counseling and care coordination regarding the above problems.  An After Visit Summary was printed and given to the patient.  FOLLOW UP: Return in about 1 month (around 07/11/2015) for discuss memory (30 min appt).

## 2015-06-10 NOTE — Progress Notes (Signed)
Pre visit review using our clinic review tool, if applicable. No additional management support is needed unless otherwise documented below in the visit note. 

## 2015-06-13 ENCOUNTER — Encounter: Payer: Self-pay | Admitting: Family Medicine

## 2015-06-21 ENCOUNTER — Encounter (HOSPITAL_COMMUNITY): Payer: Self-pay | Admitting: Family Medicine

## 2015-06-21 ENCOUNTER — Inpatient Hospital Stay (HOSPITAL_COMMUNITY)
Admission: EM | Admit: 2015-06-21 | Discharge: 2015-06-23 | DRG: 596 | Disposition: A | Discharge status: Home / Self Care | Payer: Medicare Other | Attending: Cardiology | Admitting: Cardiology

## 2015-06-21 ENCOUNTER — Emergency Department (HOSPITAL_COMMUNITY): Payer: Medicare Other

## 2015-06-21 ENCOUNTER — Observation Stay: Admission: AD | Admit: 2015-06-21 | Payer: Medicare Other | Source: Ambulatory Visit | Admitting: Cardiology

## 2015-06-21 DIAGNOSIS — Z9841 Cataract extraction status, right eye: Secondary | ICD-10-CM | POA: Diagnosis not present

## 2015-06-21 DIAGNOSIS — Z79899 Other long term (current) drug therapy: Secondary | ICD-10-CM | POA: Diagnosis not present

## 2015-06-21 DIAGNOSIS — R55 Syncope and collapse: Secondary | ICD-10-CM | POA: Diagnosis present

## 2015-06-21 DIAGNOSIS — Z9842 Cataract extraction status, left eye: Secondary | ICD-10-CM | POA: Diagnosis not present

## 2015-06-21 DIAGNOSIS — B029 Zoster without complications: Principal | ICD-10-CM | POA: Diagnosis present

## 2015-06-21 DIAGNOSIS — M19041 Primary osteoarthritis, right hand: Secondary | ICD-10-CM | POA: Diagnosis present

## 2015-06-21 DIAGNOSIS — I440 Atrioventricular block, first degree: Secondary | ICD-10-CM | POA: Diagnosis present

## 2015-06-21 DIAGNOSIS — N390 Urinary tract infection, site not specified: Secondary | ICD-10-CM | POA: Diagnosis present

## 2015-06-21 DIAGNOSIS — I452 Bifascicular block: Secondary | ICD-10-CM | POA: Diagnosis present

## 2015-06-21 DIAGNOSIS — I2510 Atherosclerotic heart disease of native coronary artery without angina pectoris: Secondary | ICD-10-CM | POA: Diagnosis present

## 2015-06-21 DIAGNOSIS — E785 Hyperlipidemia, unspecified: Secondary | ICD-10-CM | POA: Diagnosis present

## 2015-06-21 DIAGNOSIS — I200 Unstable angina: Secondary | ICD-10-CM | POA: Diagnosis present

## 2015-06-21 DIAGNOSIS — M19042 Primary osteoarthritis, left hand: Secondary | ICD-10-CM | POA: Diagnosis present

## 2015-06-21 DIAGNOSIS — R079 Chest pain, unspecified: Secondary | ICD-10-CM | POA: Diagnosis not present

## 2015-06-21 DIAGNOSIS — W1830XA Fall on same level, unspecified, initial encounter: Secondary | ICD-10-CM | POA: Diagnosis present

## 2015-06-21 DIAGNOSIS — Z888 Allergy status to other drugs, medicaments and biological substances status: Secondary | ICD-10-CM

## 2015-06-21 DIAGNOSIS — J449 Chronic obstructive pulmonary disease, unspecified: Secondary | ICD-10-CM | POA: Diagnosis not present

## 2015-06-21 DIAGNOSIS — I2582 Chronic total occlusion of coronary artery: Secondary | ICD-10-CM | POA: Diagnosis present

## 2015-06-21 DIAGNOSIS — D696 Thrombocytopenia, unspecified: Secondary | ICD-10-CM | POA: Diagnosis present

## 2015-06-21 DIAGNOSIS — Z961 Presence of intraocular lens: Secondary | ICD-10-CM | POA: Diagnosis present

## 2015-06-21 DIAGNOSIS — Z7982 Long term (current) use of aspirin: Secondary | ICD-10-CM | POA: Diagnosis not present

## 2015-06-21 DIAGNOSIS — Z90710 Acquired absence of both cervix and uterus: Secondary | ICD-10-CM

## 2015-06-21 DIAGNOSIS — I25110 Atherosclerotic heart disease of native coronary artery with unstable angina pectoris: Secondary | ICD-10-CM | POA: Diagnosis not present

## 2015-06-21 DIAGNOSIS — R0789 Other chest pain: Secondary | ICD-10-CM | POA: Diagnosis not present

## 2015-06-21 DIAGNOSIS — Z8542 Personal history of malignant neoplasm of other parts of uterus: Secondary | ICD-10-CM

## 2015-06-21 DIAGNOSIS — I10 Essential (primary) hypertension: Secondary | ICD-10-CM | POA: Diagnosis present

## 2015-06-21 LAB — COMPREHENSIVE METABOLIC PANEL
ALBUMIN: 4.1 g/dL (ref 3.5–5.0)
ALT: 15 U/L (ref 14–54)
ANION GAP: 7 (ref 5–15)
AST: 22 U/L (ref 15–41)
Alkaline Phosphatase: 54 U/L (ref 38–126)
BILIRUBIN TOTAL: 0.4 mg/dL (ref 0.3–1.2)
BUN: 12 mg/dL (ref 6–20)
CO2: 30 mmol/L (ref 22–32)
Calcium: 9.3 mg/dL (ref 8.9–10.3)
Chloride: 101 mmol/L (ref 101–111)
Creatinine, Ser: 0.69 mg/dL (ref 0.44–1.00)
GFR calc Af Amer: >60 mL/min (ref >60)
GFR calc non Af Amer: >60 mL/min (ref >60)
GLUCOSE: 110 mg/dL — AB (ref 65–99)
POTASSIUM: 4.8 mmol/L (ref 3.5–5.1)
Sodium: 138 mmol/L (ref 135–145)
TOTAL PROTEIN: 6.7 g/dL (ref 6.5–8.1)

## 2015-06-21 LAB — I-STAT TROPONIN, ED: TROPONIN I, POC: 0.01 ng/mL (ref 0.00–0.08)

## 2015-06-21 LAB — CBC WITH DIFFERENTIAL/PLATELET
BASOS PCT: 0 %
Basophils Absolute: 0 10*3/uL (ref 0.0–0.1)
EOS ABS: 0.1 10*3/uL (ref 0.0–0.7)
Eosinophils Relative: 2 %
HCT: 37.9 % (ref 36.0–46.0)
HEMOGLOBIN: 12.7 g/dL (ref 12.0–15.0)
Lymphocytes Relative: 28 %
Lymphs Abs: 1.5 10*3/uL (ref 0.7–4.0)
MCH: 30 pg (ref 26.0–34.0)
MCHC: 33.5 g/dL (ref 30.0–36.0)
MCV: 89.4 fL (ref 78.0–100.0)
Monocytes Absolute: 0.3 10*3/uL (ref 0.1–1.0)
Monocytes Relative: 6 %
NEUTROS PCT: 64 %
Neutro Abs: 3.4 10*3/uL (ref 1.7–7.7)
Platelets: 169 10*3/uL (ref 150–400)
RBC: 4.24 MIL/uL (ref 3.87–5.11)
RDW: 13.1 % (ref 11.5–15.5)
WBC: 5.3 10*3/uL (ref 4.0–10.5)

## 2015-06-21 LAB — LIPID PANEL
CHOL/HDL RATIO: 2.4 ratio
CHOLESTEROL: 171 mg/dL (ref 0–200)
HDL: 71 mg/dL (ref >40)
LDL CALC: 95 mg/dL (ref 0–99)
TRIGLYCERIDES: 26 mg/dL (ref <150)
VLDL: 5 mg/dL (ref 0–40)

## 2015-06-21 LAB — TROPONIN I
Troponin I: <0.03 ng/mL (ref <0.031)
Troponin I: <0.03 ng/mL (ref <0.031)
Troponin I: <0.03 ng/mL (ref <0.031)

## 2015-06-21 LAB — TSH: TSH: 0.755 u[IU]/mL (ref 0.350–4.500)

## 2015-06-21 LAB — BRAIN NATRIURETIC PEPTIDE: B NATRIURETIC PEPTIDE 5: 109.6 pg/mL — AB (ref 0.0–100.0)

## 2015-06-21 LAB — HEPARIN LEVEL (UNFRACTIONATED): Heparin Unfractionated: 0.11 IU/mL — ABNORMAL LOW (ref 0.30–0.70)

## 2015-06-21 MED ORDER — NITROGLYCERIN 0.4 MG SL SUBL
0.4000 mg | SUBLINGUAL_TABLET | SUBLINGUAL | Status: DC | PRN
  Administered 2015-06-21 (×3): 0.4 mg via SUBLINGUAL

## 2015-06-21 MED ORDER — PANTOPRAZOLE SODIUM 40 MG PO TBEC: 40.0000 mg | DELAYED_RELEASE_TABLET | Freq: Every day | ORAL | Status: DC

## 2015-06-21 MED ORDER — SODIUM CHLORIDE 0.9 % IJ SOLN
3.0000 mL | Freq: Two times a day (BID) | INTRAMUSCULAR | Status: DC
Start: 2015-06-21 — End: 2015-06-22

## 2015-06-21 MED ORDER — SIMVASTATIN 40 MG PO TABS: 40.0000 mg | ORAL_TABLET | Freq: Every morning | ORAL | Status: DC

## 2015-06-21 MED ORDER — AMLODIPINE BESYLATE 5 MG PO TABS: 5.0000 mg | ORAL_TABLET | Freq: Every day | ORAL | Status: DC

## 2015-06-21 MED ORDER — SODIUM CHLORIDE 0.9 % IV SOLN
INTRAVENOUS | Status: DC
  Administered 2015-06-22: 06:00:00 via INTRAVENOUS

## 2015-06-21 MED ORDER — AMLODIPINE BESYLATE 5 MG PO TABS: 5.0000 mg | ORAL_TABLET | Freq: Two times a day (BID) | ORAL | Status: DC

## 2015-06-21 MED ORDER — OMEGA-3-ACID ETHYL ESTERS 1 G PO CAPS: 2.0000 g | ORAL_CAPSULE | Freq: Two times a day (BID) | ORAL | Status: DC

## 2015-06-21 MED ORDER — AMLODIPINE BESYLATE 5 MG PO TABS
5.0000 mg | ORAL_TABLET | Freq: Two times a day (BID) | ORAL | Status: DC
Start: 2015-06-21 — End: 2015-06-23
  Administered 2015-06-22 – 2015-06-23 (×3): 5 mg via ORAL
  Filled 2015-06-21 (×3): qty 1

## 2015-06-21 MED ORDER — NITROGLYCERIN 0.4 MG SL SUBL
SUBLINGUAL_TABLET | SUBLINGUAL | Status: AC
  Filled 2015-06-21: qty 1

## 2015-06-21 MED ORDER — SODIUM CHLORIDE 0.9 % IJ SOLN: 3.0000 mL | INTRAMUSCULAR | Status: DC | PRN

## 2015-06-21 MED ORDER — HEPARIN BOLUS VIA INFUSION: 3000.0000 [IU] | Freq: Once | INTRAVENOUS | Status: DC

## 2015-06-21 MED ORDER — AMLODIPINE BESYLATE 5 MG PO TABS
5.0000 mg | ORAL_TABLET | Freq: Once | ORAL | Status: AC | PRN
  Administered 2015-06-21: 5 mg via ORAL
  Filled 2015-06-21: qty 1

## 2015-06-21 MED ORDER — SODIUM CHLORIDE 0.9 % IV SOLN: 4.0000 mg | Freq: Four times a day (QID) | INTRAVENOUS | Status: DC | PRN

## 2015-06-21 MED ORDER — PANTOPRAZOLE SODIUM 40 MG PO TBEC
40.0000 mg | DELAYED_RELEASE_TABLET | Freq: Every day | ORAL | Status: DC
  Administered 2015-06-21: 40 mg via ORAL

## 2015-06-21 MED ORDER — TRIMETHOPRIM 100 MG PO TABS: 100.0000 mg | ORAL_TABLET | Freq: Every day | ORAL | Status: DC

## 2015-06-21 MED ORDER — HEPARIN BOLUS VIA INFUSION
3000.0000 [IU] | Freq: Once | INTRAVENOUS | Status: AC
  Administered 2015-06-21: 3000 [IU] via INTRAVENOUS
  Filled 2015-06-21: qty 3000

## 2015-06-21 MED ORDER — LORATADINE 10 MG PO TABS
10.0000 mg | ORAL_TABLET | Freq: Every day | ORAL | Status: DC
  Administered 2015-06-21 – 2015-06-23 (×3): 10 mg via ORAL
  Filled 2015-06-21 (×3): qty 1

## 2015-06-21 MED ORDER — ASPIRIN EC 81 MG PO TBEC: 81.0000 mg | DELAYED_RELEASE_TABLET | Freq: Every day | ORAL | Status: DC

## 2015-06-21 MED ORDER — NITROGLYCERIN 0.4 MG SL SUBL: 0.4000 mg | SUBLINGUAL_TABLET | SUBLINGUAL | Status: DC | PRN

## 2015-06-21 MED ORDER — HEPARIN (PORCINE) IN NACL 100-0.45 UNIT/ML-% IJ SOLN
1300.0000 [IU]/h | INTRAMUSCULAR | Status: DC
  Filled 2015-06-21: qty 250

## 2015-06-21 MED ORDER — OMEGA-3-ACID ETHYL ESTERS 1 G PO CAPS
1.0000 g | ORAL_CAPSULE | Freq: Two times a day (BID) | ORAL | Status: DC
Start: 2015-06-21 — End: 2015-06-23
  Administered 2015-06-21 – 2015-06-23 (×4): 1 g via ORAL
  Filled 2015-06-21 (×4): qty 1

## 2015-06-21 MED ORDER — POLYETHYLENE GLYCOL 3350 17 G PO PACK
17.0000 g | PACK | Freq: Two times a day (BID) | ORAL | Status: DC
  Administered 2015-06-21 – 2015-06-23 (×3): 17 g via ORAL
  Filled 2015-06-21 (×3): qty 1

## 2015-06-21 MED ORDER — ACETAMINOPHEN 325 MG PO TABS: 650.0000 mg | ORAL_TABLET | ORAL | Status: DC | PRN

## 2015-06-21 MED ORDER — ATORVASTATIN CALCIUM 20 MG PO TABS
20.0000 mg | ORAL_TABLET | Freq: Every day | ORAL | Status: DC
  Administered 2015-06-22: 20 mg via ORAL
  Filled 2015-06-21: qty 1

## 2015-06-21 MED ORDER — HEPARIN (PORCINE) IN NACL 100-0.45 UNIT/ML-% IJ SOLN
700.0000 [IU]/h | INTRAMUSCULAR | Status: DC
Start: 2015-06-21 — End: 2015-06-21

## 2015-06-21 MED ORDER — LOSARTAN POTASSIUM 50 MG PO TABS
50.0000 mg | ORAL_TABLET | Freq: Every day | ORAL | Status: DC
  Administered 2015-06-22 – 2015-06-23 (×2): 50 mg via ORAL
  Filled 2015-06-21 (×2): qty 1

## 2015-06-21 MED ORDER — HEPARIN (PORCINE) IN NACL 100-0.45 UNIT/ML-% IJ SOLN
900.0000 [IU]/h | INTRAMUSCULAR | Status: DC
  Administered 2015-06-21: 700 [IU]/h via INTRAVENOUS
  Filled 2015-06-21: qty 250

## 2015-06-21 MED ORDER — SODIUM CHLORIDE 0.9 % IV SOLN: 250.0000 mL | INTRAVENOUS | Status: DC | PRN

## 2015-06-21 MED ORDER — ASPIRIN 325 MG PO TABS
325.0000 mg | ORAL_TABLET | Freq: Every day | ORAL | Status: DC
  Administered 2015-06-21 – 2015-06-23 (×2): 325 mg via ORAL
  Filled 2015-06-21 (×3): qty 1

## 2015-06-21 MED ORDER — PANTOPRAZOLE SODIUM 40 MG PO TBEC
40.0000 mg | DELAYED_RELEASE_TABLET | Freq: Every day | ORAL | Status: DC
  Administered 2015-06-22 – 2015-06-23 (×2): 40 mg via ORAL
  Filled 2015-06-21 (×3): qty 1

## 2015-06-21 MED ORDER — ASPIRIN 81 MG PO CHEW
81.0000 mg | CHEWABLE_TABLET | ORAL | Status: AC
  Administered 2015-06-22: 81 mg via ORAL
  Filled 2015-06-21: qty 1

## 2015-06-21 MED ORDER — VITAMIN D 1000 UNITS PO TABS
1000.0000 [IU] | ORAL_TABLET | Freq: Every day | ORAL | Status: DC
  Administered 2015-06-23: 1000 [IU] via ORAL
  Filled 2015-06-21: qty 1

## 2015-06-21 MED ORDER — LOSARTAN POTASSIUM 25 MG PO TABS: 25.0000 mg | ORAL_TABLET | Freq: Two times a day (BID) | ORAL | Status: DC

## 2015-06-21 MED ORDER — TRIMETHOPRIM 100 MG PO TABS
100.0000 mg | ORAL_TABLET | Freq: Every day | ORAL | Status: DC
  Administered 2015-06-22 – 2015-06-23 (×2): 100 mg via ORAL
  Filled 2015-06-21 (×2): qty 1

## 2015-06-21 MED ORDER — POLYETHYLENE GLYCOL 3350 17 G PO PACK: 17.0000 g | PACK | Freq: Every day | ORAL | Status: DC

## 2015-06-21 MED ORDER — OXYBUTYNIN CHLORIDE 5 MG PO TABS
5.0000 mg | ORAL_TABLET | Freq: Every day | ORAL | Status: DC
  Administered 2015-06-22 – 2015-06-23 (×2): 5 mg via ORAL
  Filled 2015-06-21 (×2): qty 1

## 2015-06-21 NOTE — ED Notes (Signed)
Pt ambulatory to restroom

## 2015-06-21 NOTE — ED Provider Notes (Signed)
Medical screening examination/treatment/procedure(s) were conducted as a shared visit with non-physician practitioner(s) and myself.  I personally evaluated the patient during the encounter.   EKG Interpretation   Date/Time:  Monday June 21 2015 13:16:53 EST Ventricular Rate:  59 PR Interval:  282 QRS Duration: 134 QT Interval:  492 QTC Calculation: 487 R Axis:   -99 Text Interpretation:  Sinus bradycardia with 1st degree A-V block Possible  Left atrial enlargement Right bundle branch block Abnormal ECG Confirmed  by Voncile Schwarz  MD, Zalayah Pizzuto [phone]) on 06/21/2015 2:37:37 PM      Results for orders placed or performed during the hospital encounter of 06/21/15  I-stat troponin, ED (not at [facility], [facility])  Result Value Ref Range   Troponin i, poc 0.01 0.00 - 0.08 ng/mL   Comment 3           Results for orders placed or performed during the hospital encounter of 06/21/15  I-stat troponin, ED (not at [facility], [facility])  Result Value Ref Range   Troponin i, poc 0.01 0.00 - 0.08 ng/mL   Comment 3           Dg Chest 2 View  06/21/2015  CLINICAL DATA:  Intermittent chest pain since Thursday, EKG changes, history coronary artery disease, hypertension, hyperlipidemia, uterine cancer EXAM: CHEST  2 VIEW COMPARISON:  05/29/2013 FINDINGS: Enlargement of cardiac silhouette. Atherosclerotic calcification and elongation of thoracic aorta. Mediastinal contours and pulmonary vascularity of an otherwise normal. Emphysematous changes consistent with COPD. Calcified granuloma lower RIGHT lung. No acute infiltrate, pleural effusion or pneumothorax. Bones diffusely demineralized. IMPRESSION: Enlargement of cardiac silhouette. COPD changes. No acute abnormalities. Electronically Signed   By: Lavonia Dana M.D.   On: 06/21/2015 14:19    Patient sent in from Dr. Thurman Coyer office. We discussed the situation with Dr. Wynonia Lawman. Done are as a direct admit and order should be done. He wanted her started on heparin for concern for  intermittent chest pain 1 week representing an unstable angina type picture. Patient currently is without any chest pain. Patient's troponin here is negative for any acute event. EKG without evidence of a STEMI. Patient denies any shortness of breath. Room air sats are 99%. Patient has a known history of coronary artery disease. Status post cardiac cath in 2009 had an occluded LAD. 4050% RCA.  Currently there are no beds available for admission patient will be held in the emergency department until admitting bed is available as per Dr. Thurman Coyer orders.  Fredia Sorrow, MD 06/21/15 [phone]

## 2015-06-21 NOTE — Progress Notes (Signed)
ANTICOAGULATION CONSULT NOTE - Initial Consult  Pharmacy Consult for heparin Indication: chest pain/ACS  Allergies  Allergen Reactions  . Macrobid [employer] Macrocrystal] Other (See Comments)    "I had chills & fever"    Patient Measurements: Height: 5\' 6"  (167.6 cm) Weight: 123 lb (55.792 kg) IBW/kg (Calculated) : 59.3 Heparin Dosing Weight: 55.6kg  Vital Signs: Temp: 97.9 F (36.6 C) (01/16 1311) Temp Source: Oral (01/16 1311) BP: 180/69 mmHg (01/16 1311) Pulse Rate: 52 (01/16 1311)  Labs:  Recent Labs  06/21/15 1330  HGB 12.7  HCT 37.9  PLT 169    CrCl cannot be calculated (Patient has no serum creatinine result on file.).   Medical History: Past Medical History  Diagnosis Date  . CAD (coronary artery disease)     Cath 2009  Occluded LAD, 40-50% RCA and circ managed medically   . Hypertension   . Hyperlipidemia   . GERD (gastroesophageal reflux disease)     (also LPR) Schatzki's ring, s/p dil '97; food impaction 08/2010, on PPI therapy since w/out further sx so no dil performed  . Hyponatremia 10/03/2011    Na 131 on labs 02/2015 at [facility]  . Arthritis     "think it's osteo; got some in my hands"  . Bladder infection, chronic 10/03/11    "have had one for over 1 yr"--has been on trimethaprim 100 mg qd since 2014  . Uterine cancer ([place]) 2000  . RBBB with left anterior fascicular block   . Third nerve palsy 05/2013    presented as diplopia; felt by neuro to be microvascular insult so no carotid dopplers needed (CT and MRI neg for CVA)  . Mixed stress and urge urinary incontinence   . Abnormal finding on thyroid function test     per old records: T3 low, free T4 elevated, TSH normal.  Armour thyroid made her feel worse and T4 went up so med d/c'd and liothyronine 5 mcg tried but also made pt feel worse.  Endo (Dr. Loanne Drilling felt like Biotin was causing the abnormal TFT's).  . Chronic constipation     slow transit.  Barium enema to eval for colonic  stricture 10/2014 was NORMAL  . Thrombocytopenia ([place]) 02/2013    Plts 114K  . Cognitive dysfunction     Assessment: 11 yof with CP/UA, seen in outpatient cards clinic today. Pharmacy consulted to dose heparin for ACS/CP. No anticoag noted pta. Labs pending. No bleed documented.   Goal of Therapy:  Heparin level 0.3-0.7 units/ml Monitor platelets by anticoagulation protocol: Yes   Plan:  Heparin 3000 unit bolus Heparin at 700 units/h 8h HL Daily HL/CBC Mon s/sx bleeding  Elicia Lamp, PharmD, BCPS Clinical Pharmacist Pager [phone] 06/21/2015 2:05 PM

## 2015-06-21 NOTE — ED Notes (Signed)
Attempted report 

## 2015-06-21 NOTE — ED Provider Notes (Signed)
CSN: [id number]     Arrival date & time 06/21/15  1223 History   First MD Initiated Contact with Patient 06/21/15 1345     Chief Complaint  Patient presents with  . Chest Pain     (Consider location/radiation/quality/duration/timing/severity/associated sxs/prior Treatment) HPI Sabrina Alvarez is a 82 y.o. female with known coronary artery disease, heart cath in 2013 that shows LAD occlusion and three-vessel disease, comes in from her cardiologist's office for evaluation of chest pain. Patient reports she began increasing chest tightness that radiates into her right shoulder starting on Thursday while walking around the grocery store. She reports episodes have been intermittent since Thursday. Most recent episode occurred this morning at approximately 2:00 AM and the discomfort woke her up out of her sleep. She reports going to her cardiologist's office today, Dr. Wynonia Lawman and was told to come to the emergency department for further evaluation. She denies any discomfort now. She took 325 mg of aspirin this morning.  Past Medical History  Diagnosis Date  . CAD (coronary artery disease)     Cath 2009  Occluded LAD, 40-50% RCA and circ managed medically   . Hypertension   . Hyperlipidemia   . GERD (gastroesophageal reflux disease)     (also LPR) Schatzki's ring, s/p dil '97; food impaction 08/2010, on PPI therapy since w/out further sx so no dil performed  . Hyponatremia 10/03/2011    Na 131 on labs 02/2015 at [facility]  . Arthritis     "think it's osteo; got some in my hands"  . Bladder infection, chronic 10/03/11    "have had one for over 1 yr"--has been on trimethaprim 100 mg qd since 2014  . Uterine cancer ([place]) 2000  . RBBB with left anterior fascicular block   . Third nerve palsy 05/2013    presented as diplopia; felt by neuro to be microvascular insult so no carotid dopplers needed (CT and MRI neg for CVA)  . Mixed stress and urge urinary incontinence   . Abnormal finding on thyroid  function test     per old records: T3 low, free T4 elevated, TSH normal.  [place] thyroid made her feel worse and T4 went up so med d/c'd and liothyronine 5 mcg tried but also made pt feel worse.  Endo (Dr. Loanne Drilling felt like Biotin was causing the abnormal TFT's).  . Chronic constipation     slow transit.  Barium enema to eval for colonic stricture 10/2014 was NORMAL  . Thrombocytopenia ([place]) 02/2013    Plts 114K  . Cognitive dysfunction    Past Surgical History  Procedure Laterality Date  . Dilation and curettage of uterus    . Cesarean section  [id number]; 1960  . Abdominal hysterectomy  2000  . Hernia repair      abdominal "twice"  . Appendectomy  2000  . Tonsillectomy      as a child  . Cataract extraction w/ intraocular lens  implant, bilateral  1990's  . Repair knee ligament  ~ 2008    left  . Left heart catheterization with coronary angiogram N/A 10/04/2011    Procedure: LEFT HEART CATHETERIZATION WITH CORONARY ANGIOGRAM;  Surgeon: Jacolyn Reedy, MD;  Location: [facility] CATH LAB;  Service: Cardiovascular;  Laterality: N/A;  . Colonoscopy  04/2001    Neg; pt cancelled screening colon 06/2011  . Transthoracic echocardiogram  05/2013    EF 60-65%, trivial aortic regurg, no wall motion abnorm   Family History  Problem Relation Age of Onset  .  Arthritis Mother   . Diabetes Sister   . Cancer Neg Hx   . Heart disease Neg Hx   . Rheum arthritis Sister   . Thyroid disease Neg Hx    Social History  Substance Use Topics  . Smoking status: Never Smoker   . Smokeless tobacco: Never Used  . Alcohol Use: No   OB History    No data available     Review of Systems A 10 point review of systems was completed and was negative except for pertinent positives and negatives as mentioned in the history of present illness     Allergies  Macrobid  Home Medications   Prior to Admission medications   Medication Sig Start Date End Date Taking? Authorizing Provider  amLODipine (NORVASC) 5 MG  tablet Take 1 tablet (5 mg total) by mouth 2 (two) times daily. Patient taking differently: Take 5 mg by mouth daily.  10/04/11  Yes Jacolyn Reedy, MD  aspirin 325 MG tablet Take 1 tablet (325 mg total) by mouth daily. 05/31/13  Yes Geradine Girt, DO  Cholecalciferol (VITAMIN D3) 1000 UNITS CAPS Take 1,000 Units by mouth daily.    Yes Historical Provider, MD  co-enzyme Q-10 30 MG capsule Take 30 mg by mouth daily.    Yes Historical Provider, MD  fexofenadine (ALLEGRA) 180 MG tablet Take 1 tablet (180 mg total) by mouth daily. 06/10/15  Yes Tammi Sou, MD  Ginkgo Biloba 120 MG CAPS Take 1 capsule by mouth 3 (three) times daily.   Yes Historical Provider, MD  glucosamine-chondroitin 500-400 MG tablet Take 1 tablet by mouth daily.   Yes Historical Provider, MD  ibuprofen (ADVIL,MOTRIN) 200 MG tablet Take 400 mg by mouth 2 (two) times daily as needed for moderate pain.    Yes Historical Provider, MD  losartan (COZAAR) 50 MG tablet Take 50 mg by mouth daily. 06/10/15  Yes Historical Provider, MD  Omega-3 Fatty Acids (FISH OIL) 1000 MG CAPS Take 1 capsule by mouth daily.   Yes Historical Provider, MD  oxybutynin (DITROPAN) 5 MG tablet Take 5 mg by mouth daily.   Yes Historical Provider, MD  pantoprazole (PROTONIX) 40 MG tablet Take 1 tablet (40 mg total) by mouth daily. 10/04/11  Yes Jacolyn Reedy, MD  polyethylene glycol [facility]) packet Take 17 g by mouth 2 (two) times daily.    Yes Historical Provider, MD  simvastatin (ZOCOR) 40 MG tablet Take 40 mg by mouth every morning.    Yes Historical Provider, MD  trimethoprim (TRIMPEX) 100 MG tablet Take 100 mg by mouth daily. 05/22/13  Yes Historical Provider, MD  losartan (COZAAR) 25 MG tablet Take 1 tablet (25 mg total) by mouth 2 (two) times daily. Patient not taking: Reported on 06/21/2015 10/04/11   Jacolyn Reedy, MD   BP 178/77 mmHg  Pulse 47  Temp(Src) 97.9 F (36.6 C) (Oral)  Resp 13  Ht 5\' 6"  (1.676 m)  Wt 55.792 kg  BMI 19.86  kg/m2  SpO2 97% Physical Exam  Constitutional: She is oriented to person, place, and time. She appears well-developed and well-nourished.  Overall well-appearing Caucasian female  HENT:  Head: Normocephalic and atraumatic.  Mouth/Throat: Oropharynx is clear and moist.  Eyes: Conjunctivae are normal. Pupils are equal, round, and reactive to light. Right eye exhibits no discharge. Left eye exhibits no discharge. No scleral icterus.  Neck: Neck supple.  Cardiovascular: Normal rate, regular rhythm and normal heart sounds.   Pulmonary/Chest: Effort normal  and breath sounds normal. No respiratory distress. She has no wheezes. She has no rales.  Abdominal: Soft. There is no tenderness.  Musculoskeletal: She exhibits no tenderness.  Neurological: She is alert and oriented to person, place, and time.  Cranial Nerves II-XII grossly intact  Skin: Skin is warm and dry. No rash noted.  Psychiatric: She has a normal mood and affect.  Nursing note and vitals reviewed.   ED Course  Procedures (including critical care time) Labs Review Labs Reviewed  BASIC METABOLIC PANEL  CBC  HEPARIN LEVEL (UNFRACTIONATED)  I-STAT TROPOININ, ED    Imaging Review Dg Chest 2 View  06/21/2015  CLINICAL DATA:  Intermittent chest pain since Thursday, EKG changes, history coronary artery disease, hypertension, hyperlipidemia, uterine cancer EXAM: CHEST  2 VIEW COMPARISON:  05/29/2013 FINDINGS: Enlargement of cardiac silhouette. Atherosclerotic calcification and elongation of thoracic aorta. Mediastinal contours and pulmonary vascularity of an otherwise normal. Emphysematous changes consistent with COPD. Calcified granuloma lower RIGHT lung. No acute infiltrate, pleural effusion or pneumothorax. Bones diffusely demineralized. IMPRESSION: Enlargement of cardiac silhouette. COPD changes. No acute abnormalities. Electronically Signed   By: Lavonia Dana M.D.   On: 06/21/2015 14:19   I have personally reviewed and evaluated  these images and lab results as part of my medical decision-making.   EKG Interpretation   Date/Time:  Monday June 21 2015 13:16:53 EST Ventricular Rate:  59 PR Interval:  282 QRS Duration: 134 QT Interval:  492 QTC Calculation: 487 R Axis:   -99 Text Interpretation:  Sinus bradycardia with 1st degree A-V block Possible  Left atrial enlargement Right bundle branch block Abnormal ECG Confirmed  by ZACKOWSKI  MD, SCOTT [phone]) on 06/21/2015 2:37:37 PM     Meds given in ED:  Medications  heparin ADULT infusion 100 units/mL (25000 units/250 mL) (700 Units/hr Intravenous New Bag/Given 06/21/15 1433)  heparin bolus via infusion 3,000 Units (3,000 Units Intravenous Given 06/21/15 1433)    New Prescriptions   No medications on file   Filed Vitals:   06/21/15 1311 06/21/15 1430  BP: 180/69 178/77  Pulse: 52 47  Temp: 97.9 F (36.6 C)   TempSrc: Oral   Resp: 18 13  Height: 5\' 6"  (1.676 m)   Weight: 55.792 kg   SpO2: 99% 97%    MDM  AMIELIA RAMSON is a 82 y.o. female with history of coronary artery disease, complete occlusion of LAD and 3 vessel disease. She comes in today after an recommendation of her cardiologist, Dr. Wynonia Lawman for evaluation of possible unstable angina. No discomfort on arrival. EKG essentially unchanged. Initial troponin is negative. Plan to consult cardiology. Discussed with Dr. Wynonia Lawman, patient admitted to cardiology service. Per nurse's report, original admission orders placed by Dr. Wynonia Lawman were canceled by hospital due to no beds being available, will replace admission orders in the emergency department. Discussed with my attending, Dr. Rogene Houston, who also saw the patient and agrees with plan. Final diagnoses:  Unstable angina [facility])       Comer Locket, PA-C 06/22/15 1313

## 2015-06-21 NOTE — Progress Notes (Signed)
BP 172/71. MD made aware.

## 2015-06-21 NOTE — Progress Notes (Signed)
Still vague pressure like pains.  BP is up some.  Discussed with patient and son and plan cardiac cath tomorrow via radial approach by [facility].  Cardiac catheterization was discussed with the patient fully including risks of myocardial infarction, death, stroke, bleeding, arrhythmia, dye allergy, renal insufficiency or bleeding.  The patient understands and is willing to proceed. Possibility of intervention discussed also.  Kerry Hough MD [facility]

## 2015-06-21 NOTE — Plan of Care (Signed)
Problem: Consults Goal: Cardiac Cath Patient Education (See Patient Education module for education specifics.) Outcome: Progressing Pt educated about cardiac catheterization procedure in am. No further questions, does not desire to watch cardiac cath video.

## 2015-06-21 NOTE — H&P (Signed)
Sabrina Alvarez  Date of visit:  06/21/2015 DOB:  02/27/1928    Age:  82 yrs. Medical record number:  [id number]     Account number:  [id number] Primary Care Provider: Kelle Darting ____________________________ CURRENT DIAGNOSES  1. CAD native with other angina  2. Bifascicular block  3. Essential (primary) hypertension  4. Hyperlipidemia  5. Chest pain ____________________________ ALLERGIES  Nitrofurantoin, Intolerance-unknown ____________________________ MEDICATIONS  1. Vitamin D3 400 unit capsule, 2000mg  qd  2. simvastatin 40 mg Tablet, 1 p.o. daily  3. Fish Oil 1,000 mg Capsule, TID  4. Glucosamine 500 mg tablet, 1 p.o. daily  5. coQ10 (ubiquinol) 200 mg capsule, 400 mg qd  6. Ibuprofen IB 200 mg tablet, 2 p.o. daily  7. nitroglycerin 0.4 mg tablet, sublingual, PRN  8. pantoprazole 40 mg tablet,delayed release (DR/EC), BID 1 p.o. daily  9. ginkgo biloba 120 mg tablet, 1 p.o. daily  10. amlodipine 5 mg tablet, 1 p.o. daily  11. aspirin 325 mg tablet, 1 p.o. daily  12. trimethoprim 100 mg tablet, 1 p.o. daily  13. oxybutynin chloride ER 10 mg tablet,extended release 24 hr, 1 p.o. daily  14. losartan 50 mg tablet, 1/2 tab b.i.d.  15. fexofenadine 180 mg tablet, 1 p.o. daily  16. magnesium 250 mg tablet, 1 p.o. daily ____________________________ CHIEF COMPLAINTS  chest pressure ____________________________ HISTORY OF PRESENT ILLNESS Patient was seen early for evaluation of progressive chest discomfort and tightness and was admitted to the hospital for treatment of unstable angina. She has a history of coronary artery disease and has known total occlusion of her LAD. She had moderate disease in her other vessels at catheterization a little under 4 years ago. She has had some mild memory issues but still lives alone at home and tries to walk daily. She developed midsternal pressure and tightness that occurred at rest last Thursday that has become progressive since then. She is  vague in describing her symptoms but was able to take a walk on Friday without recurrent symptoms. It is hard to tell the symptoms have an exertional component to them. She awoke this morning with some chest symptoms and had some vague tightness while she was sitting out in the waiting room. An EKG shows bifascicular block with first degree AV block but is unchanged from before. The frequency of the pain is somewhat vague but she states that her last between 5 and 10 minutes and is associated with mild shortness of breath. There may be some mild radiation into the right arm. ____________________________ PAST HISTORY  Past Medical Illnesses:  hyperlipidemia, hypertension, history of malignant polyp of vagina, GERD;  Cardiovascular Illnesses:  CAD;  Infectious Diseases:  childhood illnesses of mumps, measles and chickenpox;  Surgical Procedures:  D, hysterectomy, hernia repair, Alvarez Section;  NYHA Classification:  I;  Canadian Angina Classification:  Class 0: Asymptomatic;  Cardiology Procedures-Invasive:  cardiac cath (left) September 2009, cardiac cath (left) May 2013;  Cardiology Procedures-Noninvasive:  echocardiogram September 1996, treadmill cardiolite May 2009;  Cardiac Cath Results:  normal Left main, occluded LAD, 40% stenosis mid CFX, 50% stenosis mid CFX, 40-50 % stenosis mid RCA;  Peripheral Vascular Procedures:  abdominal U/S December 2008 no aneurysm;  LVEF of 60% documented via cardiac cath on 10/04/2011,   ____________________________ CARDIO-PULMONARY TEST DATES EKG Date:  06/21/2015;   Cardiac Cath Date:  10/04/2011;  Nuclear Study Date:  10/30/2007;  Echocardiography Date: 02/15/1995;  Chest Xray Date: 10/29/2003;   ____________________________ FAMILY HISTORY Father -- Father dead, CVA Mother --  Mother dead, Abdominal aortic aneurysm Sister -- Sister alive with problem, Coronary artery bypass grafting ____________________________ SOCIAL HISTORY Alcohol Use:  does not use alcohol;  Smoking:   does not smoke;  Diet:  regular diet;  Lifestyle:  widowed and Husband died suddenly of heart disease;  Exercise:  some exercise;  Occupation:  [employer] with husband;  Residence:  lives alone;   ____________________________ REVIEW OF SYSTEMS General:  denies recent weight change, fatique or change in exercise tolerance. Ears, Nose, Throat, Mouth:  partial hearing loss Respiratory: denies dyspnea, cough, wheezing or hemoptysis. Cardiovascular:  please review HPI Abdominal: constipationGenitourinary-Female: frequency, nocturia Musculoskeletal:  arthritis of the fingers, chronic venous insufficiency Neurological:  denies headaches, stroke, or TIA Psychiatric:  insomnia  ____________________________ PHYSICAL EXAMINATION VITAL SIGNS  Blood Pressure:  146/80 Sitting, Right arm, regular cuff  , 146/72 Standing, Right arm and regular cuff   Pulse:  60/min. Weight:  122.00 lbs. Height:  66"BMI: 20  Constitutional:  pleasant white female, in no acute distress, looks stated age Skin:  scattered ecchymosis present Head:  normocephalic, normal hair pattern, no masses or tenderness Eyes:  EOMS Intact, PERRLA, Alvarez and S clear, Funduscopic exam not done. ENT:  ears, nose and throat reveal no gross abnormalities.  Dentition good. Neck:  supple, without massess. No JVD, thyromegaly or carotid bruits. Carotid upstroke normal. Chest:  normal symmetry, clear to auscultation. Cardiac:  regular rhythm, S1 normal, S2 normal, No S3 or S4, no murmurs, gallops or rubs detected. Abdomen:  abdomen soft,non-tender, no masses, no hepatospenomegaly, or aneurysm noted Peripheral Pulses:  the femoral, popliteal, dorsalis pedis, and posterior tibial pulses are full and equal bilaterally with no bruits auscultated. 2+ Extremities & Back:  mild bilateral venous insufficiency changes present, trace edema Neurological:  no gross motor or sensory deficits noted, affect appropriate, oriented  x3. ____________________________ MOST RECENT LIPID PANEL 08/03/14  CHOL TOTL 159 mg/dl, LDL 89 NM, HDL 62 mg/dl and TRIGLYCER 43 mg/dl ____________________________ IMPRESSIONS/PLAN  1. Chest pain consistent with unstable angina pectoris 2. Coronary artery disease with previous medical treatment 3. Hyperlipidemia 4. Hypertension controlled  Recommendations:  Difficult to assess at this time but symptoms consistent with unstable angina. Plan to send to the hospital for intravenous separate, serial cardiac enzymes and EKG. May require repeat catheterization because previous myocardial perfusion scanning has been abnormal. ____________________________ TODAYS ORDERS  1. 12 Lead EKG: Today                       ____________________________ Cardiology Physician:  Kerry Hough. MD [facility]

## 2015-06-21 NOTE — ED Notes (Signed)
Pt here for intermittent chest pain x 1 week. Sent here by Dr. Wynonia Lawman due to changes on her EKG. Denies SOB.

## 2015-06-22 ENCOUNTER — Encounter (HOSPITAL_COMMUNITY)
Admission: EM | Disposition: A | Discharge status: Home / Self Care | Payer: Medicare Other | Source: Home / Self Care | Attending: Cardiology

## 2015-06-22 ENCOUNTER — Encounter (HOSPITAL_COMMUNITY): Payer: Self-pay | Admitting: General Practice

## 2015-06-22 DIAGNOSIS — I25110 Atherosclerotic heart disease of native coronary artery with unstable angina pectoris: Secondary | ICD-10-CM

## 2015-06-22 HISTORY — PX: CARDIAC CATHETERIZATION: SHX172

## 2015-06-22 LAB — CBC
HCT: 35.8 % — ABNORMAL LOW (ref 36.0–46.0)
Hemoglobin: 12.2 g/dL (ref 12.0–15.0)
MCH: 29.9 pg (ref 26.0–34.0)
MCHC: 34.1 g/dL (ref 30.0–36.0)
MCV: 87.7 fL (ref 78.0–100.0)
PLATELETS: 127 10*3/uL — AB (ref 150–400)
RBC: 4.08 MIL/uL (ref 3.87–5.11)
RDW: 13 % (ref 11.5–15.5)
WBC: 4.7 10*3/uL (ref 4.0–10.5)

## 2015-06-22 LAB — GLUCOSE, CAPILLARY: Glucose-Capillary: 119 mg/dL — ABNORMAL HIGH (ref 65–99)

## 2015-06-22 LAB — HEPARIN LEVEL (UNFRACTIONATED): Heparin Unfractionated: 0.36 IU/mL (ref 0.30–0.70)

## 2015-06-22 LAB — PROTIME-INR
INR: 1.23 (ref 0.00–1.49)
Prothrombin Time: 15.6 seconds — ABNORMAL HIGH (ref 11.6–15.2)

## 2015-06-22 LAB — TROPONIN I: Troponin I: <0.03 ng/mL (ref <0.031)

## 2015-06-22 SURGERY — LEFT HEART CATH AND CORONARY ANGIOGRAPHY

## 2015-06-22 MED ORDER — NITROGLYCERIN 1 MG/10 ML FOR IR/CATH LAB
INTRA_ARTERIAL | Status: AC
  Filled 2015-06-22: qty 10

## 2015-06-22 MED ORDER — LIDOCAINE HCL (PF) 1 % IJ SOLN
INTRAMUSCULAR | Status: AC
  Filled 2015-06-22: qty 30

## 2015-06-22 MED ORDER — VERAPAMIL HCL 2.5 MG/ML IV SOLN
INTRA_ARTERIAL | Status: DC | PRN
  Administered 2015-06-22: 7.5 mL via INTRA_ARTERIAL

## 2015-06-22 MED ORDER — VALACYCLOVIR HCL 500 MG PO TABS: 1000.0000 mg | ORAL_TABLET | Freq: Three times a day (TID) | ORAL | Status: DC

## 2015-06-22 MED ORDER — HEPARIN (PORCINE) IN NACL 2-0.9 UNIT/ML-% IJ SOLN
INTRAMUSCULAR | Status: AC
  Filled 2015-06-22: qty 1000

## 2015-06-22 MED ORDER — VERAPAMIL HCL 2.5 MG/ML IV SOLN
INTRAVENOUS | Status: AC
  Filled 2015-06-22: qty 2

## 2015-06-22 MED ORDER — MIDAZOLAM HCL 2 MG/2ML IJ SOLN
INTRAMUSCULAR | Status: AC
  Filled 2015-06-22: qty 2

## 2015-06-22 MED ORDER — NITROGLYCERIN 1 MG/10 ML FOR IR/CATH LAB
INTRA_ARTERIAL | Status: DC | PRN
  Administered 2015-06-22: 17:00:00

## 2015-06-22 MED ORDER — HEPARIN SODIUM (PORCINE) 1000 UNIT/ML IJ SOLN
INTRAMUSCULAR | Status: AC
  Filled 2015-06-22: qty 1

## 2015-06-22 MED ORDER — SODIUM CHLORIDE 0.9 % IJ SOLN: 3.0000 mL | Freq: Two times a day (BID) | INTRAMUSCULAR | Status: DC

## 2015-06-22 MED ORDER — HEPARIN BOLUS VIA INFUSION
1000.0000 [IU] | Freq: Once | INTRAVENOUS | Status: AC
  Administered 2015-06-22: 1000 [IU] via INTRAVENOUS
  Filled 2015-06-22: qty 1000

## 2015-06-22 MED ORDER — IOHEXOL 350 MG/ML SOLN
INTRAVENOUS | Status: DC | PRN
  Administered 2015-06-22: 95 mL via INTRACARDIAC

## 2015-06-22 MED ORDER — SODIUM CHLORIDE 0.9 % WEIGHT BASED INFUSION: 3.0000 mL/kg/h | INTRAVENOUS | Status: AC

## 2015-06-22 MED ORDER — SODIUM CHLORIDE 0.9 % IJ SOLN: 3.0000 mL | INTRAMUSCULAR | Status: DC | PRN

## 2015-06-22 MED ORDER — VALACYCLOVIR HCL 500 MG PO TABS
1000.0000 mg | ORAL_TABLET | Freq: Two times a day (BID) | ORAL | Status: DC
  Administered 2015-06-22 – 2015-06-23 (×2): 1000 mg via ORAL
  Filled 2015-06-22 (×2): qty 2

## 2015-06-22 MED ORDER — MIDAZOLAM HCL 2 MG/2ML IJ SOLN
INTRAMUSCULAR | Status: DC | PRN
  Administered 2015-06-22: 1 mg via INTRAVENOUS

## 2015-06-22 MED ORDER — SODIUM CHLORIDE 0.9 % IV SOLN: 250.0000 mL | INTRAVENOUS | Status: DC | PRN

## 2015-06-22 SURGICAL SUPPLY — 10 items
CATH INFINITI 5FR ANG PIGTAIL (CATHETERS) ×3 IMPLANT
CATH OPTITORQUE TIG 4.0 5F (CATHETERS) ×3 IMPLANT
DEVICE RAD COMP TR BAND LRG (VASCULAR PRODUCTS) ×3 IMPLANT
GLIDESHEATH SLEND A-KIT 6F 22G (SHEATH) ×3 IMPLANT
KIT HEART LEFT (KITS) ×3 IMPLANT
PACK CARDIAC CATHETERIZATION (CUSTOM PROCEDURE TRAY) ×3 IMPLANT
SYR MEDRAD MARK V 150ML (SYRINGE) ×3 IMPLANT
TRANSDUCER W/STOPCOCK (MISCELLANEOUS) ×3 IMPLANT
TUBING CIL FLEX 10 FLL-RA (TUBING) ×3 IMPLANT
WIRE SAFE-T 1.5MM-J .035X260CM (WIRE) ×3 IMPLANT

## 2015-06-22 NOTE — Progress Notes (Signed)
Subjective:  See note below, patient had an assisted fall this morning after she was on the bathroom and the nurse noted that her heart rate had dropped into the 30s transiently while she was having a bowel movement.  She was later standing at the sink and had somewhat low blood pressure at the time and was assisted to the floor without injury.  She feels fine at present time and blood pressure is coming up.  She did receive 3 nitroglycerin last evening as well as an additional dose of amlodipine.  No chest tightness this morning but had some vague tightness last evening not responsive to nitroglycerin.  Objective:  Vital Signs in the last 24 hours: BP 133/54 mmHg  Pulse 58  Temp(Src) 98.5 F (36.9 C) (Oral)  Resp 10  Ht 5\' 6"  (1.676 m)  Wt 52.39 kg (115 lb 8 oz)  BMI 18.65 kg/m2  SpO2 98%  Physical Exam:  Elderly pleasant female in no acute distress.   Lungs:  Clear Cardiac:  Regular rhythm, normal S1 and S2, no S3 Extremities:  No edema present  Intake/Output from previous day: 01/16 0701 - 01/17 0700 In: 270.5 [P.O.:240; I.V.:30.5] Out: 900 [Urine:900]  Weight Filed Weights   06/21/15 1311 06/21/15 1600 06/22/15 0500  Weight: 55.792 kg (123 lb) 52.935 kg (116 lb 11.2 oz) 52.39 kg (115 lb 8 oz)    Lab Results: Basic Metabolic Panel:  Recent Labs  06/21/15 1330  NA 138  K 4.8  CL 101  CO2 30  GLUCOSE 110*  BUN 12  CREATININE 0.69   CBC:  Recent Labs  06/21/15 1330 06/22/15 0426  WBC 5.3 4.7  NEUTROABS 3.4  --   HGB 12.7 12.2  HCT 37.9 35.8*  MCV 89.4 87.7  PLT 169 127*   Cardiac Enzymes: Troponin (Point of Care Test)  Recent Labs  06/21/15 1347  TROPIPOC 0.01   Cardiac Panel (last 3 results)  Recent Labs  06/21/15 1854 06/21/15 2244 06/22/15 0426  TROPONINI <0.03 <0.03 <0.03    Telemetry: Sinus rhythm and bradycardia with first-degree AV block, episode of junctional rhythm noted during episode  Assessment/Plan:  1.  Witnessed syncopal  episode sounded vasovagal in nature due to circumstances.  Contribution of medicines given the evening before may be possible also  2.  Chest pain consistent with unstable angina pectoris 3.  Coronary artery disease with known occlusion of LAD and moderate disease elsewhere 4.  Mild thrombocytopenia this morning  Recommendations:  Catheterization planned for later on today.  She does have significant conduction system disease and we'll continue to monitor for additional arrhythmias.  Monitor orthostatic blood pressure.      Kerry Hough  MD [facility]  06/22/2015, 8:47 AM

## 2015-06-22 NOTE — Progress Notes (Signed)
   06/22/15 0525  What Happened  Was fall witnessed? Yes  Who witnessed fall? Kerrie Buffalo RN  Patients activity before fall bathroom-unassisted  Was patient injured? No  Follow Up  MD notified Dr. Aundra Dubin  Time MD notified [phone]  Family notified No- patient refusal  Additional tests No  Progress note created (see row info) Yes  Adult Fall Risk Assessment  Risk Factor Category (scoring not indicated) Fall has occurred during this admission (document High fall risk)  Patient's Fall Risk High Fall Risk (>13 points)  Adult Fall Risk Interventions  Required Bundle Interventions *See Row Information* High fall risk - low, moderate, and high requirements implemented  Additional Interventions Fall risk signage;Individualized elimination schedule;Secure all tubes/drains;Use of appropriate toileting equipment (bedpan, BSC, etc.)  Fall with Injury Screening  Risk For Fall Injury- See Row Information  A;C  Intervention(s) for 2 or more risk criteria identified Gait Belt  Vitals  Temp 98.5 F (36.9 C)  Temp Source Oral  BP (!) 133/54 mmHg  BP Location Left Arm  BP Method Automatic  Patient Position (if appropriate) Lying  ECG Heart Rate (!) 46  Resp 10  Oxygen Therapy  SpO2 98 %  O2 Device Room Air  Pain Assessment  Pain Assessment No/denies pain  Pain Score 0  Patients Stated Pain Goal 0  Neurological  Neuro (WDL) WDL  Level of Consciousness Alert  Orientation Level Oriented X4  Cognition Appropriate at baseline;Appropriate attention/concentration;Appropriate judgement;Appropriate safety awareness;Appropriate for developmental age;Follows commands  Neuro Symptoms Forgetful (baseline)  Neuro Additional Assessments No  Glasgow Coma Scale  Eye Opening 4  Best Verbal Response (NON-intubated) 5  Best Motor Response 6  Glasgow Coma Scale Score 15  Musculoskeletal  Musculoskeletal (WDL) X  Assistive Device None  Generalized Weakness Yes (related to bradycardia)  Weight  Bearing Restrictions No  Integumentary  Integumentary (WDL) WDL

## 2015-06-22 NOTE — Care Management Note (Signed)
Case Management Note  Patient Details  Name: Sabrina Alvarez MRN: [id number] Date of Birth: [birth year]  Subjective/Objective:   Pt admitted for Unstable Angina and Bradycardia. Plan for Cardiac Cath 06-22-15. Pt is from home alone, however has family support. No DME needs / [place] Needs at this time.                  Action/Plan: No further needs from CM.   Expected Discharge Date:                  Expected Discharge Plan:  Home/Self Care  In-House Referral:  NA  Discharge planning Services  CM Consult  Post Acute Care Choice:  NA Choice offered to:  NA  DME Arranged:  N/A DME Agency:  NA  HH Arranged:  NA HH Agency:  NA  Status of Service:  Completed, signed off  Medicare Important Message Given:    Date Medicare IM Given:    Medicare IM give by:    Date Additional Medicare IM Given:    Additional Medicare Important Message give by:     If discussed at [place] of Stay Meetings, dates discussed:    Additional Comments:  Bethena Roys, RN 06/22/2015, 12:59 PM

## 2015-06-22 NOTE — Progress Notes (Signed)
UR Completed Lichelle Viets Graves-Bigelow, RN,BSN [phone]  

## 2015-06-22 NOTE — Interval H&P Note (Signed)
History and Physical Interval Note:  06/22/2015 3:39 PM  Sabrina Alvarez  has presented today for surgery, with the diagnosis of cp- Unstable Angina with known CAD.   The various methods of treatment have been discussed with the patient and family. After consideration of risks, benefits and other options for treatment, the patient has consented to  Procedure(s): Left Heart Cath and Coronary Angiography (N/A) as a surgical intervention .  The patient's history has been reviewed, patient examined, no change in status, stable for surgery.  I have reviewed the patient's chart and labs.  Questions were answered to the patient's satisfaction.     Cath Lab Visit (complete for each Cath Lab visit)  Clinical Evaluation Leading to the Procedure:   ACS: Yes.    Non-ACS:    Anginal Classification: CCS IV  Anti-ischemic medical therapy: Minimal Therapy (1 class of medications)  Non-Invasive Test Results: No non-invasive testing performed  Prior CABG: No previous CABG   TIMI SCORE  Patient Information:  TIMI Score is 4  UA/NSTEMI and intermediate-risk features (e.g., TIMI score 3?4) for short-term risk of death or nonfatal MI  Revascularization of the presumed culprit artery   A (8)  Indication: 10; Score: 8   HARDING, DAVID W

## 2015-06-22 NOTE — Plan of Care (Signed)
Problem: Pain Managment: Goal: General experience of comfort will improve Outcome: Progressing Patient experienced right sided chest pressure 6/10 once during the shift. Pain was relieved with 3 sublingual nitroglycerin. Patient has remained pain free since.

## 2015-06-22 NOTE — Progress Notes (Signed)
Catheterization reviewed.  No significant change since previous catheterization.  Following return to the floor the nurse noted that she has a rash suggestive of shingles that has broken out's afternoon on her right chest.  This is close to where she has been having some pain.  Examination shows she has a vesicle rash on her posterior shoulder and also along the right breast and chest.  This may be shingles.  Discussed with family and will initiate treatment for this this evening.  Radial band to be removed later.  Currently site looks okay.  Kerry Hough MD [facility]

## 2015-06-22 NOTE — Progress Notes (Signed)
Notified Dr. Wynonia Lawman of rash to r breast area and lateral breast. Will see pt. Carroll Kinds RN

## 2015-06-22 NOTE — Progress Notes (Signed)
Patient had an assisted fall at 7. Assisted fall was related to a syncopal episode. Patient had been in the bathroom having a bowel movement and her heart rate dropped into the 30s. This RN saw the HR change on the telemetry monitor and was also notified by CCMD. Patient's HR returned to normal but then dropped again. When this RN entered the room to check on the patient, she was washing her hands. When RN asked patient how she was feeling, patient stated "I feel weak" and then slumped over on the sink. Patient briefly became unresponsive; RN assisted patient to the floor and obtained vital signs. BP was 91/61 at the time of the event but returned to baseline within 5 minutes.  Dr. Aundra Dubin on call for Dr. Wynonia Lawman was notified. Instructed RN to maintain patient on bedrest and notify Dr. Wynonia Lawman as soon as he was available.   Patient is alert and oriented x4. No signs of injury. Patient agrees with bedrest plan. Bed alarm is on; yellow armband and yellow socks on.

## 2015-06-22 NOTE — H&P (View-Only) (Signed)
Subjective:  See note below, patient had an assisted fall this morning after she was on the bathroom and the nurse noted that her heart rate had dropped into the 30s transiently while she was having a bowel movement.  She was later standing at the sink and had somewhat low blood pressure at the time and was assisted to the floor without injury.  She feels fine at present time and blood pressure is coming up.  She did receive 3 nitroglycerin last evening as well as an additional dose of amlodipine.  No chest tightness this morning but had some vague tightness last evening not responsive to nitroglycerin.  Objective:  Vital Signs in the last 24 hours: BP 133/54 mmHg  Pulse 58  Temp(Src) 98.5 F (36.9 C) (Oral)  Resp 10  Ht 5\' 6"  (1.676 m)  Wt 52.39 kg (115 lb 8 oz)  BMI 18.65 kg/m2  SpO2 98%  Physical Exam:  Elderly pleasant female in no acute distress.   Lungs:  Clear Cardiac:  Regular rhythm, normal S1 and S2, no S3 Extremities:  No edema present  Intake/Output from previous day: 01/16 0701 - 01/17 0700 In: 270.5 [P.O.:240; I.V.:30.5] Out: 900 [Urine:900]  Weight Filed Weights   06/21/15 1311 06/21/15 1600 06/22/15 0500  Weight: 55.792 kg (123 lb) 52.935 kg (116 lb 11.2 oz) 52.39 kg (115 lb 8 oz)    Lab Results: Basic Metabolic Panel:  Recent Labs  06/21/15 1330  NA 138  K 4.8  CL 101  CO2 30  GLUCOSE 110*  BUN 12  CREATININE 0.69   CBC:  Recent Labs  06/21/15 1330 06/22/15 0426  WBC 5.3 4.7  NEUTROABS 3.4  --   HGB 12.7 12.2  HCT 37.9 35.8*  MCV 89.4 87.7  PLT 169 127*   Cardiac Enzymes: Troponin (Point of Care Test)  Recent Labs  06/21/15 1347  TROPIPOC 0.01   Cardiac Panel (last 3 results)  Recent Labs  06/21/15 1854 06/21/15 2244 06/22/15 0426  TROPONINI <0.03 <0.03 <0.03    Telemetry: Sinus rhythm and bradycardia with first-degree AV block, episode of junctional rhythm noted during episode  Assessment/Plan:  1.  Witnessed syncopal  episode sounded vasovagal in nature due to circumstances.  Contribution of medicines given the evening before may be possible also  2.  Chest pain consistent with unstable angina pectoris 3.  Coronary artery disease with known occlusion of LAD and moderate disease elsewhere 4.  Mild thrombocytopenia this morning  Recommendations:  Catheterization planned for later on today.  She does have significant conduction system disease and we'll continue to monitor for additional arrhythmias.  Monitor orthostatic blood pressure.      Kerry Hough  MD [facility]  06/22/2015, 8:47 AM

## 2015-06-22 NOTE — Progress Notes (Signed)
ANTICOAGULATION CONSULT NOTE - Follow Up Consult  Pharmacy Consult for heparin Indication: USAP  Labs:  Recent Labs  06/21/15 1330 06/21/15 1615 06/21/15 1854 06/21/15 2244  HGB 12.7  --   --   --   HCT 37.9  --   --   --   PLT 169  --   --   --   HEPARINUNFRC  --   --   --  0.11*  CREATININE 0.69  --   --   --   TROPONINI <0.03 <0.03 <0.03 <0.03    Assessment: 82yo female subtherapeutic on heparin with initial dosing for USAP.  Goal of Therapy:  Heparin level 0.3-0.7 units/ml   Plan:  Will rebolus with heparin 1000 units and increase gtt by 4 units/kg/hr to 900 units/hr and check level in [place], PharmD, BCPS  06/22/2015,12:04 AM

## 2015-06-22 NOTE — Progress Notes (Signed)
ANTICOAGULATION CONSULT NOTE - Initial Consult  Pharmacy Consult for heparin Indication: chest pain/ACS  Patient Measurements: Height: 5\' 6"  (167.6 cm) Weight: 115 lb 8 oz (52.39 kg) IBW/kg (Calculated) : 59.3 Heparin Dosing Weight: 55.6kg  Vital Signs: Temp: 98.5 F (36.9 C) (01/17 0525) Temp Source: Oral (01/17 0525) BP: 133/54 mmHg (01/17 0525)  Labs:  Recent Labs  06/21/15 1330  06/21/15 1854 06/21/15 2244 06/22/15 0426 06/22/15 0801  HGB 12.7  --   --   --  12.2  --   HCT 37.9  --   --   --  35.8*  --   PLT 169  --   --   --  127*  --   LABPROT  --   --   --   --   --  15.6*  INR  --   --   --   --   --  1.23  HEPARINUNFRC  --   --   --  0.11*  --  0.36  CREATININE 0.69  --   --   --   --   --   TROPONINI <0.03  < > <0.03 <0.03 <0.03  --   < > = values in this interval not displayed.   Assessment: 19 yof seen in outpatient cards clinic with ACS/CP admitted on 1/16. Pharmacy consulted to dose heparin for ACS/CP. Planned for cardiac cath later today. HL 0.36 (goal 0.3 - 0.7) on 900 units/hr. H/H stable, no sxs of bleeding.   Goal of Therapy:  Heparin level 0.3-0.7 units/ml Monitor platelets by anticoagulation protocol: Yes   Plan:  1. Continue heparin at 900 units/hr 2. Will f/u heparin plans post cath and order further labs/levels as needed  Vincenza Hews, PharmD, BCPS 06/22/2015, 9:53 AM Pager: [phone]

## 2015-06-23 ENCOUNTER — Encounter (HOSPITAL_COMMUNITY): Payer: Self-pay | Admitting: Cardiology

## 2015-06-23 LAB — BASIC METABOLIC PANEL
ANION GAP: 8 (ref 5–15)
BUN: 12 mg/dL (ref 6–20)
CALCIUM: 8.8 mg/dL — AB (ref 8.9–10.3)
CO2: 23 mmol/L (ref 22–32)
Chloride: 102 mmol/L (ref 101–111)
Creatinine, Ser: 0.74 mg/dL (ref 0.44–1.00)
GFR calc non Af Amer: >60 mL/min (ref >60)
Glucose, Bld: 104 mg/dL — ABNORMAL HIGH (ref 65–99)
POTASSIUM: 4 mmol/L (ref 3.5–5.1)
Sodium: 133 mmol/L — ABNORMAL LOW (ref 135–145)

## 2015-06-23 LAB — CBC
HEMATOCRIT: 35.2 % — AB (ref 36.0–46.0)
Hemoglobin: 12 g/dL (ref 12.0–15.0)
MCH: 30.2 pg (ref 26.0–34.0)
MCHC: 34.1 g/dL (ref 30.0–36.0)
MCV: 88.7 fL (ref 78.0–100.0)
Platelets: 141 10*3/uL — ABNORMAL LOW (ref 150–400)
RBC: 3.97 MIL/uL (ref 3.87–5.11)
RDW: 13.2 % (ref 11.5–15.5)
WBC: 5.5 10*3/uL (ref 4.0–10.5)

## 2015-06-23 MED ORDER — NITROGLYCERIN 0.4 MG SL SUBL: 0.4000 mg | SUBLINGUAL_TABLET | SUBLINGUAL | Status: AC | PRN

## 2015-06-23 MED ORDER — VALACYCLOVIR HCL 1 G PO TABS: 1000.0000 mg | ORAL_TABLET | Freq: Two times a day (BID) | ORAL | Status: DC

## 2015-06-23 MED FILL — Heparin Sodium (Porcine) Inj 1000 Unit/ML: INTRAMUSCULAR | Qty: 10 | Status: AC

## 2015-06-23 NOTE — Plan of Care (Signed)
Problem: Safety: Goal: Ability to remain free from injury will improve Outcome: Progressing Patient had an assisted fall on 1/17 due to a vasovagal episode. Patient was not injured. Patient is now a high fall risk; bed alarm is being utilized; call bell is within reach; yellow socks and armband in place.  Problem: Pain Managment: Goal: General experience of comfort will improve Outcome: Progressing Patient has had no complaints of pain.  Problem: Phase I Progression Outcomes Goal: Vascular site scale level 0 - I Vascular Site Scale Level 0: No bruising/bleeding/hematoma Level I (Mild): Bruising/Ecchymosis, minimal bleeding/ooozing, palpable hematoma < 3 cm Level II (Moderate): Bleeding not affecting hemodynamic parameters, pseudoaneurysm, palpable hematoma > 3 cm Level III (Severe) Bleeding which affects hemodynamic parameters or retroperitoneal hemorrhage  TR band was removed on 1/17 at 2300. No signs of bleeding; vascular site is a level 0.

## 2015-06-23 NOTE — Discharge Summary (Signed)
Physician Discharge Summary  Patient ID: Sabrina Alvarez MRN: [id number] DOB/AGE: 01-16-1928 82 y.o.  Admit date: 06/21/2015 Discharge date: 06/23/2015  Primary Physician:  Dr. Julien Nordmann   Primary Discharge Diagnosis:  1.  Chest pain initially consistent with unstable angina pectoris but ultimately thought to be due to herpes zoster  Secondary Discharge Diagnosis: 2.  Coronary artery disease with total occlusion of the LAD and moderate disease in the circumflex and right coronary artery 3.  Shingles 4.  Hypertension 5.  Vasovagal syncope 6.  Hyperlipidemia under treatment 7.  Recurrent urinary tract infection  Procedures:  Cardiac catheterization  Hospital Course: The patient was admitted to the hospital for evaluation of progressive chest discomfort and tightness consistent with unstable angina.  She has a history of coronary artery disease and has known total occlusion of her LAD.  She has had mild memory issues but still lives alone at home and tries to walk daily.  She developed midsternal and right-sided pressure and tightness that occurred at rest last Thursday that became progressive since then.  She is vague in describing her symptoms but was able take a walk on Friday without recurrent symptoms.  It was difficult to tell if the patient had an exertional component to them.  The morning of admission she awoke with pressure and tightness and had vague tightness while she was sitting in the waiting room with my office.  EKG showed bifascicular block with first-degree AV block.  Frequency of the pain was vague but she stated that last between 5 and 10 minutes and is associated with mild shortness of breath.  There may be mild radiation into the right arm.   The patient was brought into the hospital and placed on heparin for suspicious of unstable angina.  She continued to have episodes of tightness unrelieved with nitroglycerin.  Troponins were normal.  The next morning she had a  syncopal episode accompanied with a junctional rhythm while having a bowel movement and standing at the sink.  This was thought to have the characteristics of a vagal episode was witnessed.  Later that day she underwent a radial catheterization that showed total occlusion of the LAD unchanged from before with moderate disease in the right coronary artery and circumflex similar to before with normal LV function.  On return from the cath lab she was noted to have a rash in her right upper chest consistent with shingles.  She did note some reproduction of her pain at the site of the rash and it was postulated that she may have had the development of shingles.  She was initiated on therapy with Valtrex.  The next morning she was ambulatory and her pain had improved somewhat.  She will be discharged on Valtrex 1000 mg twice daily on recommendation from the pharmacy and is to use ibuprofen for pain.  Continue her other medications and follow-up with me in one week.  Discharge Exam: Blood pressure 99/58, pulse 56, temperature 98.7 F (37.1 C), temperature source Oral, resp. rate 13, height 5\' 6"  (1.676 m), weight 52.028 kg (114 lb 11.2 oz), SpO2 99 %. Weight: 52.028 kg (114 lb 11.2 oz) Radial cath site with mild ecchymoses but no significant swelling, typical rash of herpes zoster on right posterior shoulder and coming around involving the breast on the right side Labs: CBC:   Lab Results  Component Value Date   WBC 5.5 06/23/2015   HGB 12.0 06/23/2015   HCT 35.2* 06/23/2015   MCV 88.7 06/23/2015  PLT 141* 06/23/2015    CMP:  Recent Labs Lab 06/21/15 1330 06/23/15 0550  NA 138 133*  K 4.8 4.0  CL 101 102  CO2 30 23  BUN 12 12  CREATININE 0.69 0.74  CALCIUM 9.3 8.8*  PROT 6.7  --   BILITOT 0.4  --   ALKPHOS 54  --   ALT 15  --   AST 22  --   GLUCOSE 110* 104*    Lipid Panel     Component Value Date/Time   CHOL 171 06/21/2015 1330   TRIG 26 06/21/2015 1330   HDL 71 06/21/2015 1330    CHOLHDL 2.4 06/21/2015 1330   VLDL 5 06/21/2015 1330   LDLCALC 95 06/21/2015 1330    Cardiac Enzymes:  Recent Labs  06/21/15 1854 06/21/15 2244 06/22/15 0426  TROPONINI <0.03 <0.03 <0.03   Thyroid: Lab Results  Component Value Date   TSH 0.755 06/21/2015   T3TOTAL 78.4* 05/10/2015   Hemoglobin A1C: Lab Results  Component Value Date   HGBA1C 5.8* 05/30/2013    Radiology: Cardiomegaly, atherosclerosis and aorta, COPD   EKG: First degree AV block, sinus rhythm, right bundle branch block and left axis deviation  Discharge Medications:   Medication List    TAKE these medications        amLODipine 5 MG tablet  Commonly known as:  NORVASC  Take 1 tablet (5 mg total) by mouth 2 (two) times daily.     aspirin 325 MG tablet  Take 1 tablet (325 mg total) by mouth daily.     co-enzyme Q-10 30 MG capsule  Take 30 mg by mouth daily.     fexofenadine 180 MG tablet  Commonly known as:  ALLEGRA  Take 1 tablet (180 mg total) by mouth daily.     Fish Oil 1000 MG Caps  Take 1 capsule by mouth daily.     Ginkgo Biloba 120 MG Caps  Take 1 capsule by mouth 3 (three) times daily.     glucosamine-chondroitin 500-400 MG tablet  Take 1 tablet by mouth daily.     ibuprofen 200 MG tablet  Commonly known as:  ADVIL,MOTRIN  Take 400 mg by mouth 2 (two) times daily as needed for moderate pain.     losartan 25 MG tablet  Commonly known as:  COZAAR  Take 1 tablet (25 mg total) by mouth 2 (two) times daily.     losartan 50 MG tablet  Commonly known as:  COZAAR  Take 50 mg by mouth daily.     nitroGLYCERIN 0.4 MG SL tablet  Commonly known as:  NITROSTAT  Place 1 tablet (0.4 mg total) under the tongue every 5 (five) minutes as needed for chest pain.     oxybutynin 5 MG tablet  Commonly known as:  DITROPAN  Take 5 mg by mouth daily.     pantoprazole 40 MG tablet  Commonly known as:  PROTONIX  Take 1 tablet (40 mg total) by mouth daily.     polyethylene glycol packet   Commonly known as:  MIRALAX / GLYCOLAX  Take 17 g by mouth 2 (two) times daily.     simvastatin 40 MG tablet  Commonly known as:  ZOCOR  Take 40 mg by mouth every morning.     trimethoprim 100 MG tablet  Commonly known as:  TRIMPEX  Take 100 mg by mouth daily.     valACYclovir 1000 MG tablet  Commonly known as:  VALTREX  Take 1 tablet (  1,000 mg total) by mouth 2 (two) times daily.     Vitamin D3 1000 units Caps  Take 1,000 Units by mouth daily.       Followup plans and appointments: Follow-up with Dr. Wynonia Lawman in one week.  Time spent with patient to include physician time: 30 minutes  Signed: W. Doristine Church. MD [facility] 06/23/2015, 9:10 AM

## 2015-06-28 ENCOUNTER — Ambulatory Visit: Payer: Medicare Other | Admitting: Family Medicine

## 2015-07-02 ENCOUNTER — Encounter: Payer: Self-pay | Admitting: Cardiology

## 2015-07-02 DIAGNOSIS — I10 Essential (primary) hypertension: Secondary | ICD-10-CM | POA: Diagnosis not present

## 2015-07-02 DIAGNOSIS — I25119 Atherosclerotic heart disease of native coronary artery with unspecified angina pectoris: Secondary | ICD-10-CM | POA: Diagnosis not present

## 2015-07-02 DIAGNOSIS — I2510 Atherosclerotic heart disease of native coronary artery without angina pectoris: Secondary | ICD-10-CM

## 2015-07-02 DIAGNOSIS — I452 Bifascicular block: Secondary | ICD-10-CM | POA: Diagnosis not present

## 2015-07-05 ENCOUNTER — Encounter: Payer: Self-pay | Admitting: Family Medicine

## 2015-07-07 ENCOUNTER — Ambulatory Visit (INDEPENDENT_AMBULATORY_CARE_PROVIDER_SITE_OTHER): Payer: Medicare Other | Admitting: Family Medicine

## 2015-07-07 ENCOUNTER — Ambulatory Visit: Payer: Medicare Other | Admitting: Family Medicine

## 2015-07-07 ENCOUNTER — Encounter: Payer: Self-pay | Admitting: Family Medicine

## 2015-07-07 VITALS — BP 127/75 | HR 53 | Temp 97.8°F | Resp 20 | Wt 125.5 lb

## 2015-07-07 DIAGNOSIS — I200 Unstable angina: Secondary | ICD-10-CM | POA: Diagnosis not present

## 2015-07-07 DIAGNOSIS — I25118 Atherosclerotic heart disease of native coronary artery with other forms of angina pectoris: Secondary | ICD-10-CM

## 2015-07-07 DIAGNOSIS — B029 Zoster without complications: Secondary | ICD-10-CM

## 2015-07-07 NOTE — Patient Instructions (Signed)
Buy over the counter cream called Capsaicin cream: apply a thin film to the area of pain up to four times a day.

## 2015-07-07 NOTE — Progress Notes (Signed)
OFFICE VISIT  07/07/2015   CC:  Chief Complaint  Patient presents with  . Hospitalization Follow-up  . Herpes Zoster     HPI:    Patient is a 82 y.o. Caucasian female who presents for hospital f/u: Was admitted 1/16-1/18, 2017 for chest pain worrisome for [place].  I reviewed hospital notes, labs, procedures. Labs showed no ACS.  Ongoing CP not better with NTG prompted cath and this showed no signif change from prior cath.  She then started to get a rash on R side of chest that appeared c/w shingles.  She was started on valtrex and d/c'd home. She has followed up with her cardiologist, Dr. Wynonia Lawman, and all was well from CV standpoint.  She says her rash has started to fade/abate, no new lesions lately. Says the pain is bad.  She is finished with the valtrex.  Past Medical History  Diagnosis Date  . CAD (coronary artery disease)     Cath 2009  Occluded LAD, 40-50% RCA and circ managed medically.  Repeat 06/22/15 no change.  . Hypertension   . Hyperlipidemia   . GERD (gastroesophageal reflux disease)     (also LPR) Schatzki's ring, s/p dil '97; food impaction 08/2010, on PPI therapy since w/out further sx so no dil performed  . Hyponatremia 10/03/2011    Na 131 on labs 02/2015 at [facility]  . Arthritis     "think it's osteo; got some in my hands"  . Bladder infection, chronic 10/03/11    "have had one for over 1 yr"--has been on trimethaprim 100 mg qd since 2014  . Uterine cancer ([place]) 2000  . RBBB with left anterior fascicular block     bifascicular block  . Third nerve palsy 05/2013    presented as diplopia; felt by neuro to be microvascular insult so no carotid dopplers needed (CT and MRI neg for CVA)  . Mixed stress and urge urinary incontinence   . Abnormal finding on thyroid function test     per old records: T3 low, free T4 elevated, TSH normal.  [place] thyroid made her feel worse and T4 went up so med d/c'd and liothyronine 5 mcg tried but also made pt feel worse.  Endo (Dr.  Loanne Drilling felt like Biotin was causing the abnormal TFT's).  Repeat TFTs OFF BIOTIN x 2 wks were NORMAL.  NO FURTHER THYROID TESTING NEEDED.  Marland Kitchen Chronic constipation     slow transit.  Barium enema to eval for colonic stricture 10/2014 was NORMAL  . Thrombocytopenia ([place]) 02/2013    Plts 114K  . Cognitive dysfunction   . Anginal pain ([place])   . History of herpes zoster 06/2015    right side of chest; presented with chest pain mimicking [place]--cardiac eval showed stable CAD compared to 2009.    Past Surgical History  Procedure Laterality Date  . Dilation and curettage of uterus    . Cesarean section  [id number]; 1960  . Abdominal hysterectomy  2000  . Hernia repair      abdominal "twice"  . Appendectomy  2000  . Tonsillectomy      as a child  . Cataract extraction w/ intraocular lens  implant, bilateral  1990's  . Repair knee ligament  ~ 2008    left  . Left heart catheterization with coronary angiogram N/A 10/04/2011    Procedure: LEFT HEART CATHETERIZATION WITH CORONARY ANGIOGRAM;  Surgeon: Jacolyn Reedy, MD;  Location: [facility] CATH LAB;  Service: Cardiovascular;  Laterality: N/A;  .  Colonoscopy  04/2001    Neg; pt cancelled screening colon 06/2011  . Transthoracic echocardiogram  05/2013    EF 60-65%, trivial aortic regurg, no wall motion abnorm  . Cardiac catheterization N/A 06/22/2015    Stable single vessel CAD, EF normal.  Procedure: Left Heart Cath and Coronary Angiography;  Surgeon: Leonie Man, MD;  Location: [facility] CV LAB;  Service: Cardiovascular;  Laterality: N/A;    Outpatient Prescriptions Prior to Visit  Medication Sig Dispense Refill  . amLODipine (NORVASC) 5 MG tablet Take 1 tablet (5 mg total) by mouth 2 (two) times daily. (Patient taking differently: Take 5 mg by mouth daily. )    . aspirin 325 MG tablet Take 1 tablet (325 mg total) by mouth daily.    . Cholecalciferol (VITAMIN D3) 1000 UNITS CAPS Take 1,000 Units by mouth daily.     Marland Kitchen co-enzyme Q-10 30 MG capsule Take  30 mg by mouth daily.     . fexofenadine (ALLEGRA) 180 MG tablet Take 1 tablet (180 mg total) by mouth daily. 30 tablet 6  . Ginkgo Biloba 120 MG CAPS Take 1 capsule by mouth 3 (three) times daily.    Marland Kitchen glucosamine-chondroitin 500-400 MG tablet Take 1 tablet by mouth daily.    Marland Kitchen ibuprofen (ADVIL,MOTRIN) 200 MG tablet Take 400 mg by mouth 2 (two) times daily as needed for moderate pain.     Marland Kitchen losartan (COZAAR) 25 MG tablet Take 1 tablet (25 mg total) by mouth 2 (two) times daily.    Marland Kitchen losartan (COZAAR) 50 MG tablet Take 50 mg by mouth daily.  3  . nitroGLYCERIN (NITROSTAT) 0.4 MG SL tablet Place 1 tablet (0.4 mg total) under the tongue every 5 (five) minutes as needed for chest pain. 25 tablet 12  . Omega-3 Fatty Acids (FISH OIL) 1000 MG CAPS Take 1 capsule by mouth daily.    Marland Kitchen oxybutynin (DITROPAN) 5 MG tablet Take 5 mg by mouth daily.    . pantoprazole (PROTONIX) 40 MG tablet Take 1 tablet (40 mg total) by mouth daily.    . polyethylene glycol (MIRALAX / GLYCOLAX) packet Take 17 g by mouth 2 (two) times daily.     . simvastatin (ZOCOR) 40 MG tablet Take 40 mg by mouth every morning.     . trimethoprim (TRIMPEX) 100 MG tablet Take 100 mg by mouth daily.    . valACYclovir (VALTREX) 1000 MG tablet Take 1 tablet (1,000 mg total) by mouth 2 (two) times daily. 24 tablet 0   Facility-Administered Medications Prior to Visit  Medication Dose Route Frequency Provider Last Rate Last Dose  . acetaminophen (TYLENOL) tablet 650 mg  650 mg Oral Q4H PRN Jacolyn Reedy, MD      . omega-3 acid ethyl esters (LOVAZA) capsule 2 g  2 g Oral BID Jacolyn Reedy, MD      . ondansetron [facility]) 4 mg in sodium chloride 0.9 % 50 mL IVPB  4 mg Intravenous Q6H PRN Jacolyn Reedy, MD        Allergies  Allergen Reactions  . Macrobid [Nitrofurantoin Macrocrystal] Other (See Comments)    "I had chills & fever"    ROS As per HPI  PE: Blood pressure 127/75, pulse 53, temperature 97.8 F (36.6 C), resp. rate 20,  weight 125 lb 8 oz (56.926 kg), SpO2 100 %. Pt examined with Sharen Hones, CMA, as chaperone. Gen: Alert, well appearing.  Patient is oriented to person, place, time, and situation. SKIN: medial  aspect of R breast has a small pinkish/erythematous,dry macule. She has two more distinct, flaky papular lesions in area under R breast tissue.  No vesicles, no weeping, no streaking, no tenderness.  No other lesions.  LABS:     Chemistry      Component Value Date/Time   NA 133* 06/23/2015 0550   K 4.0 06/23/2015 0550   CL 102 06/23/2015 0550   CO2 23 06/23/2015 0550   BUN 12 06/23/2015 0550   CREATININE 0.74 06/23/2015 0550      Component Value Date/Time   CALCIUM 8.8* 06/23/2015 0550   ALKPHOS 54 06/21/2015 1330   AST 22 06/21/2015 1330   ALT 15 06/21/2015 1330   BILITOT 0.4 06/21/2015 1330       IMPRESSION AND PLAN:  1) Herpes zoster; resolving appropriately.  For pain, we decided to let her continue ibup 400mg  bid prn and I told her about adding capsaicin cream OTC qid prn.  I told her this cream may be helpful and that when it is first applied it causes burning sensation briefly. She knows she can get zostavax in 3 mo but she is concerned about the cost.  2) CAD; stable one vessel dz.  EF normal. Reassured pt.  Continue current meds and appropriate cardiology f/u.  Spent 25 min with pt today, with >50% of this time spent in counseling and care coordination regarding the above problems.  An After Visit Summary was printed and given to the patient.  FOLLOW UP: Return in about 6 months (around 01/04/2016) for routine chronic illness f/u (30 min)-fasting not necessary.

## 2015-08-17 ENCOUNTER — Telehealth: Payer: Self-pay | Admitting: *Deleted

## 2015-08-17 ENCOUNTER — Other Ambulatory Visit: Payer: Self-pay | Admitting: Family Medicine

## 2015-08-17 MED ORDER — GABAPENTIN 100 MG PO CAPS: 100.0000 mg | ORAL_CAPSULE | Freq: Three times a day (TID) | ORAL | Status: DC

## 2015-08-17 NOTE — Telephone Encounter (Signed)
Will eRx neurontin.  Have her make a f/u appt to see how she's responding to the med in 2-3 wks.  Tell her to continue taking the med even if she feels like it is not helping.

## 2015-08-17 NOTE — Telephone Encounter (Signed)
Pt LMOM on 08/17/15 at 10:02am requesting a call back in regards to her shingles pain. She stated that she had singles in January and the rash has improved but she is still having pain from the shingles. She wants to know if Dr. Anitra Lauth can send something in for her pain. Please advise. Thanks.

## 2015-08-18 NOTE — Telephone Encounter (Signed)
Pt advised and voiced understanding.  Apt made for 09/01/15 at 2:00pm.

## 2015-08-30 ENCOUNTER — Telehealth: Payer: Self-pay | Admitting: Family Medicine

## 2015-08-30 NOTE — Telephone Encounter (Signed)
Spoke to pt, she stated that her ear doctor told her she will need to have her ears cleaned out before they can fit her for hearing aids. She wanted to know if this could be done at her visit on Wednesday (09/01/15). Per Dr. Anitra Lauth we can clean her ears out then. Pt advised and voiced understanding.

## 2015-08-30 NOTE — Telephone Encounter (Signed)
Patient left a VM on front desk line asking for a CMA to call her back.

## 2015-09-01 ENCOUNTER — Encounter: Payer: Self-pay | Admitting: Family Medicine

## 2015-09-01 ENCOUNTER — Ambulatory Visit (INDEPENDENT_AMBULATORY_CARE_PROVIDER_SITE_OTHER): Payer: Medicare Other | Admitting: Family Medicine

## 2015-09-01 VITALS — BP 122/63 | HR 56 | Temp 98.2°F | Resp 16 | Ht 64.0 in | Wt 125.2 lb

## 2015-09-01 DIAGNOSIS — B0229 Other postherpetic nervous system involvement: Secondary | ICD-10-CM | POA: Diagnosis not present

## 2015-09-01 DIAGNOSIS — H6123 Impacted cerumen, bilateral: Secondary | ICD-10-CM | POA: Diagnosis not present

## 2015-09-01 DIAGNOSIS — H6120 Impacted cerumen, unspecified ear: Secondary | ICD-10-CM | POA: Insufficient documentation

## 2015-09-01 DIAGNOSIS — I200 Unstable angina: Secondary | ICD-10-CM

## 2015-09-01 MED ORDER — GABAPENTIN 100 MG PO CAPS: ORAL_CAPSULE | ORAL | Status: DC

## 2015-09-01 NOTE — Progress Notes (Signed)
Pre visit review using our clinic review tool, if applicable. No additional management support is needed unless otherwise documented below in the visit note. 

## 2015-09-01 NOTE — Progress Notes (Signed)
OFFICE VISIT  09/01/2015   CC:  Chief Complaint  Patient presents with  . Follow-up    shingle pain   HPI:    Patient is a 82 y.o. Caucasian female who presents for 2 mo f/u herpes zoster pain. She had shingles on R chest wall in mid January this year.  When I last saw her for f/u the rash was fading but she still had quite a bit of pain.  I recommended adding capsaicin at that time.  She called the office with ongoing pain about 2 wks ago and I started neurontin at that time.  She says this medicine helps a lot, without side effect. She says she barely even thinks it needs a dose increase.  She has no rash at all.  Ear doctor told her that she has cerumen impactions and she needs [place] to clear this out.    Past Medical History  Diagnosis Date  . CAD (coronary artery disease)     Cath 2009  Occluded LAD, 40-50% RCA and circ managed medically.  Repeat 06/22/15 no change.  . Hypertension   . Hyperlipidemia   . GERD (gastroesophageal reflux disease)     (also LPR) Schatzki's ring, s/p dil '97; food impaction 08/2010, on PPI therapy since w/out further sx so no dil performed  . Hyponatremia 10/03/2011    Na 131 on labs 02/2015 at [facility]  . Arthritis     "think it's osteo; got some in my hands"  . Bladder infection, chronic 10/03/11    "have had one for over 1 yr"--has been on trimethaprim 100 mg qd since 2014  . Uterine cancer ([place]) 2000  . RBBB with left anterior fascicular block     bifascicular block  . Third nerve palsy 05/2013    presented as diplopia; felt by neuro to be microvascular insult so no carotid dopplers needed (CT and MRI neg for CVA)  . Mixed stress and urge urinary incontinence   . Abnormal finding on thyroid function test     per old records: T3 low, free T4 elevated, TSH normal.  [place] thyroid made her feel worse and T4 went up so med d/c'd and liothyronine 5 mcg tried but also made pt feel worse.  Endo (Dr. Loanne Drilling felt like Biotin was causing the abnormal TFT's).   Repeat TFTs OFF BIOTIN x 2 wks were NORMAL.  NO FURTHER THYROID TESTING NEEDED.  Marland Kitchen Chronic constipation     slow transit.  Barium enema to eval for colonic stricture 10/2014 was NORMAL  . Thrombocytopenia ([place]) 02/2013    Plts 114K  . Cognitive dysfunction   . Anginal pain ([place])   . History of herpes zoster 06/2015    right side of chest; presented with chest pain mimicking [place]--cardiac eval showed stable CAD compared to 2009.    Past Surgical History  Procedure Laterality Date  . Dilation and curettage of uterus    . Cesarean section  [id number]; 1960  . Abdominal hysterectomy  2000  . Hernia repair      abdominal "twice"  . Appendectomy  2000  . Tonsillectomy      as a child  . Cataract extraction w/ intraocular lens  implant, bilateral  1990's  . Repair knee ligament  ~ 2008    left  . Left heart catheterization with coronary angiogram N/A 10/04/2011    Procedure: LEFT HEART CATHETERIZATION WITH CORONARY ANGIOGRAM;  Surgeon: Jacolyn Reedy, MD;  Location: [facility] CATH LAB;  Service: Cardiovascular;  Laterality: N/A;  . Colonoscopy  04/2001    Neg; pt cancelled screening colon 06/2011  . Transthoracic echocardiogram  05/2013    EF 60-65%, trivial aortic regurg, no wall motion abnorm  . Cardiac catheterization N/A 06/22/2015    Stable single vessel CAD, EF normal.  Procedure: Left Heart Cath and Coronary Angiography;  Surgeon: Leonie Man, MD;  Location: [place] CV LAB;  Service: Cardiovascular;  Laterality: N/A;    Outpatient Prescriptions Prior to Visit  Medication Sig Dispense Refill  . amLODipine (NORVASC) 5 MG tablet Take 1 tablet (5 mg total) by mouth 2 (two) times daily. (Patient taking differently: Take 5 mg by mouth daily. )    . aspirin 325 MG tablet Take 1 tablet (325 mg total) by mouth daily.    . Cholecalciferol (VITAMIN D3) 1000 UNITS CAPS Take 1,000 Units by mouth daily.     Marland Kitchen co-enzyme Q-10 30 MG capsule Take 30 mg by mouth daily.     . fexofenadine (ALLEGRA) 180 MG  tablet Take 1 tablet (180 mg total) by mouth daily. 30 tablet 6  . Ginkgo Biloba 120 MG CAPS Take 1 capsule by mouth 3 (three) times daily.    Marland Kitchen glucosamine-chondroitin 500-400 MG tablet Take 1 tablet by mouth daily.    Marland Kitchen ibuprofen (ADVIL,MOTRIN) 200 MG tablet Take 400 mg by mouth 2 (two) times daily as needed for moderate pain.     Marland Kitchen losartan (COZAAR) 50 MG tablet Take 50 mg by mouth daily.  3  . nitroGLYCERIN (NITROSTAT) 0.4 MG SL tablet Place 1 tablet (0.4 mg total) under the tongue every 5 (five) minutes as needed for chest pain. 25 tablet 12  . Omega-3 Fatty Acids (FISH OIL) 1000 MG CAPS Take 1 capsule by mouth daily.    Marland Kitchen oxybutynin (DITROPAN) 5 MG tablet Take 5 mg by mouth daily.    . pantoprazole (PROTONIX) 40 MG tablet Take 1 tablet (40 mg total) by mouth daily.    . polyethylene glycol (MIRALAX / GLYCOLAX) packet Take 17 g by mouth 2 (two) times daily.     . simvastatin (ZOCOR) 40 MG tablet Take 40 mg by mouth every morning.     . trimethoprim (TRIMPEX) 100 MG tablet Take 100 mg by mouth daily.    Marland Kitchen gabapentin (NEURONTIN) 100 MG capsule Take 1 capsule (100 mg total) by mouth 3 (three) times daily. 90 capsule 0  . losartan (COZAAR) 25 MG tablet Take 1 tablet (25 mg total) by mouth 2 (two) times daily. (Patient not taking: Reported on 09/01/2015)     Facility-Administered Medications Prior to Visit  Medication Dose Route Frequency Provider Last Rate Last Dose  . acetaminophen (TYLENOL) tablet 650 mg  650 mg Oral Q4H PRN Jacolyn Reedy, MD      . omega-3 acid ethyl esters (LOVAZA) capsule 2 g  2 g Oral BID Jacolyn Reedy, MD      . ondansetron [facility]) 4 mg in sodium chloride 0.9 % 50 mL IVPB  4 mg Intravenous Q6H PRN Jacolyn Reedy, MD        Allergies  Allergen Reactions  . Macrobid [Nitrofurantoin Macrocrystal] Other (See Comments)    "I had chills & fever"    ROS As per HPI  PE: Blood pressure 122/63, pulse 56, temperature 98.2 F (36.8 C), temperature source Oral,  resp. rate 16, height 5\' 4"  (1.626 m), weight 125 lb 4 oz (56.813 kg), SpO2 99 %. Gen: Alert, well appearing.  Patient  is oriented to person, place, time, and situation. Moderate sized cerumen impactions AU, L>R--cleared without problem with curette today by myself. CV: RRR, 2/6 murm at base, no rub or gallop. Chest is clear, no wheezing or rales. Normal symmetric air entry throughout both lung fields. No chest wall deformities or tenderness. EXT: no clubbing, cyanosis, or edema.    LABS:    Chemistry      Component Value Date/Time   NA 133* 06/23/2015 0550   K 4.0 06/23/2015 0550   CL 102 06/23/2015 0550   CO2 23 06/23/2015 0550   BUN 12 06/23/2015 0550   CREATININE 0.74 06/23/2015 0550      Component Value Date/Time   CALCIUM 8.8* 06/23/2015 0550   ALKPHOS 54 06/21/2015 1330   AST 22 06/21/2015 1330   ALT 15 06/21/2015 1330   BILITOT 0.4 06/21/2015 1330     Lab Results  Component Value Date   WBC 5.5 06/23/2015   HGB 12.0 06/23/2015   HCT 35.2* 06/23/2015   MCV 88.7 06/23/2015   PLT 141* 06/23/2015   IMPRESSION AND PLAN:  1) Postherpetic neuralgia: much improved with gabapentin 100 mg tid. Will increase dose just a little: 100 mg bid, and her bedtime dose will be 200mg ---rx handed to her today.  2) L>R cerumen impaction: removed by myself with curette today.  Ears totally clear now, with normal-appearing TMs.  An After Visit Summary was printed and given to the patient.  FOLLOW UP: Return in about 3 months (around 12/02/2015) for routine chronic illness f/u (3:45 appt pls).  Signed:  Crissie Sickles, MD           09/01/2015

## 2015-09-09 ENCOUNTER — Other Ambulatory Visit: Payer: Self-pay | Admitting: Family Medicine

## 2015-09-09 NOTE — Telephone Encounter (Signed)
RF request for amlodipine LOV: 05/06/15 Next ov: 11/29/15 Last written: unknown

## 2015-09-15 DIAGNOSIS — N905 Atrophy of vulva: Secondary | ICD-10-CM | POA: Diagnosis not present

## 2015-10-25 ENCOUNTER — Other Ambulatory Visit: Payer: Self-pay | Admitting: Family Medicine

## 2015-11-03 DIAGNOSIS — R3989 Other symptoms and signs involving the genitourinary system: Secondary | ICD-10-CM | POA: Diagnosis not present

## 2015-11-03 DIAGNOSIS — R300 Dysuria: Secondary | ICD-10-CM | POA: Diagnosis not present

## 2015-11-03 DIAGNOSIS — Z0000 Encounter for general adult medical examination without abnormal findings: Secondary | ICD-10-CM | POA: Diagnosis not present

## 2015-11-03 DIAGNOSIS — N390 Urinary tract infection, site not specified: Secondary | ICD-10-CM | POA: Diagnosis not present

## 2015-11-24 DIAGNOSIS — N905 Atrophy of vulva: Secondary | ICD-10-CM | POA: Diagnosis not present

## 2015-11-24 DIAGNOSIS — R300 Dysuria: Secondary | ICD-10-CM | POA: Diagnosis not present

## 2015-11-29 ENCOUNTER — Ambulatory Visit (INDEPENDENT_AMBULATORY_CARE_PROVIDER_SITE_OTHER): Payer: Medicare Other | Admitting: Family Medicine

## 2015-11-29 ENCOUNTER — Encounter: Payer: Self-pay | Admitting: Family Medicine

## 2015-11-29 VITALS — BP 118/70 | HR 82 | Temp 99.7°F | Resp 16 | Ht 64.0 in | Wt 122.8 lb

## 2015-11-29 DIAGNOSIS — I200 Unstable angina: Secondary | ICD-10-CM

## 2015-11-29 DIAGNOSIS — R5382 Chronic fatigue, unspecified: Secondary | ICD-10-CM | POA: Diagnosis not present

## 2015-11-29 DIAGNOSIS — L659 Nonscarring hair loss, unspecified: Secondary | ICD-10-CM | POA: Diagnosis not present

## 2015-11-29 DIAGNOSIS — Z8619 Personal history of other infectious and parasitic diseases: Secondary | ICD-10-CM

## 2015-11-29 DIAGNOSIS — R6889 Other general symptoms and signs: Secondary | ICD-10-CM | POA: Diagnosis not present

## 2015-11-29 DIAGNOSIS — E785 Hyperlipidemia, unspecified: Secondary | ICD-10-CM

## 2015-11-29 DIAGNOSIS — I10 Essential (primary) hypertension: Secondary | ICD-10-CM | POA: Diagnosis not present

## 2015-11-29 MED ORDER — POLYETHYLENE GLYCOL 3350 17 G PO PACK: 17.0000 g | PACK | Freq: Two times a day (BID) | ORAL | Status: DC

## 2015-11-29 NOTE — Progress Notes (Signed)
OFFICE VISIT  11/29/2015   CC:  Chief Complaint  Patient presents with  . Follow-up    Pt is not fasting.    HPI:    Patient is a 82 y.o. Caucasian female who presents for f/u HTN, HLD, and recent hx of shingles 06/2015. She has minimal pain from this now, neurontin helped a lot, now taking only 1 pill a day.  Wants zostavax.  She is concerned about wt loss: lost about 20 lbs after death of her husband, then after that she dwindled about 5-8 more lbs down.  Appetite is good.  Says "I can't eat like I used to".  Denies abd pain with eating. No diarrhea.  No melena.   Has some constipation, cold intolerance, says hair is falling out.  We have check thyroid function a few times---nothing has panned out. She normally eats 3 times per day. She walks 3 times per week.  She lives alone but has 2 sons that help her out with yard work. She checks her bp at home some and says it is always normal. Has no complaints about any of her meds at this time.    Past Medical History  Diagnosis Date  . CAD (coronary artery disease)     Cath 2009  Occluded LAD, 40-50% RCA and circ managed medically.  Repeat 06/22/15 no change.  . Hypertension   . Hyperlipidemia   . GERD (gastroesophageal reflux disease)     (also LPR) Schatzki's ring, s/p dil '97; food impaction 08/2010, on PPI therapy since w/out further sx so no dil performed  . Hyponatremia 10/03/2011    Na 131 on labs 02/2015 at [facility]  . Arthritis     "think it's osteo; got some in my hands"  . Bladder infection, chronic 10/03/11    "have had one for over 1 yr"--has been on trimethaprim 100 mg qd since 2014  . Uterine cancer ([place]) 2000  . RBBB with left anterior fascicular block     bifascicular block  . Third nerve palsy 05/2013    presented as diplopia; felt by neuro to be microvascular insult so no carotid dopplers needed (CT and MRI neg for CVA)  . Mixed stress and urge urinary incontinence   . Abnormal finding on thyroid function test      per old records: T3 low, free T4 elevated, TSH normal.  [place] thyroid made her feel worse and T4 went up so med d/c'd and liothyronine 5 mcg tried but also made pt feel worse.  Endo (Dr. Loanne Drilling felt like Biotin was causing the abnormal TFT's).  Repeat TFTs OFF BIOTIN x 2 wks were NORMAL.  NO FURTHER THYROID TESTING NEEDED.  Marland Kitchen Chronic constipation     slow transit.  Barium enema to eval for colonic stricture 10/2014 was NORMAL  . Thrombocytopenia ([place]) 02/2013    Plts 114K  . Cognitive dysfunction   . Anginal pain ([place])   . History of herpes zoster 06/2015    right side of chest; presented with chest pain mimicking [place]--cardiac eval showed stable CAD compared to 2009.    Past Surgical History  Procedure Laterality Date  . Dilation and curettage of uterus    . Cesarean section  [id number]; 1960  . Abdominal hysterectomy  2000  . Hernia repair      abdominal "twice"  . Appendectomy  2000  . Tonsillectomy      as a child  . Cataract extraction w/ intraocular lens  implant, bilateral  1990's  .  Repair knee ligament  ~ 2008    left  . Left heart catheterization with coronary angiogram N/A 10/04/2011    Procedure: LEFT HEART CATHETERIZATION WITH CORONARY ANGIOGRAM;  Surgeon: Jacolyn Reedy, MD;  Location: [facility] CATH LAB;  Service: Cardiovascular;  Laterality: N/A;  . Colonoscopy  04/2001    Neg; pt cancelled screening colon 06/2011  . Transthoracic echocardiogram  05/2013    EF 60-65%, trivial aortic regurg, no wall motion abnorm  . Cardiac catheterization N/A 06/22/2015    Stable single vessel CAD, EF normal.  Procedure: Left Heart Cath and Coronary Angiography;  Surgeon: Leonie Man, MD;  Location: [place] CV LAB;  Service: Cardiovascular;  Laterality: N/A;    Outpatient Prescriptions Prior to Visit  Medication Sig Dispense Refill  . amLODipine (NORVASC) 5 MG tablet TAKE 1 TABLET BY MOUTH DAILY 90 tablet 3  . aspirin 325 MG tablet Take 1 tablet (325 mg total) by mouth daily.    .  Cholecalciferol (VITAMIN D3) 1000 UNITS CAPS Take 1,000 Units by mouth daily.     Marland Kitchen co-enzyme Q-10 30 MG capsule Take 30 mg by mouth daily. Reported on 11/29/2015    . gabapentin (NEURONTIN) 100 MG capsule 1 tab po qAM, 1 tab po q afternoon, and 2 tabs po qhs 120 capsule 2  . Ginkgo Biloba 120 MG CAPS Take 1 capsule by mouth 3 (three) times daily.    Marland Kitchen glucosamine-chondroitin 500-400 MG tablet Take 1 tablet by mouth daily.    Marland Kitchen ibuprofen (ADVIL,MOTRIN) 200 MG tablet Take 400 mg by mouth 2 (two) times daily as needed for moderate pain.     Marland Kitchen losartan (COZAAR) 50 MG tablet Take 50 mg by mouth daily.  3  . nitroGLYCERIN (NITROSTAT) 0.4 MG SL tablet Place 1 tablet (0.4 mg total) under the tongue every 5 (five) minutes as needed for chest pain. 25 tablet 12  . Omega-3 Fatty Acids (FISH OIL) 1000 MG CAPS Take 1 capsule by mouth daily.    . pantoprazole (PROTONIX) 40 MG tablet Take 1 tablet (40 mg total) by mouth daily.    . simvastatin (ZOCOR) 40 MG tablet TAKE 1 TABLET BY MOUTH ONCE A DAY 90 tablet 0  . trimethoprim (TRIMPEX) 100 MG tablet Take 100 mg by mouth daily.    . polyethylene glycol (MIRALAX / GLYCOLAX) packet Take 17 g by mouth 2 (two) times daily.     . fexofenadine (ALLEGRA) 180 MG tablet Take 1 tablet (180 mg total) by mouth daily. (Patient not taking: Reported on 11/29/2015) 30 tablet 6  . oxybutynin (DITROPAN) 5 MG tablet Take 5 mg by mouth daily. Reported on 11/29/2015     Facility-Administered Medications Prior to Visit  Medication Dose Route Frequency Provider Last Rate Last Dose  . acetaminophen (TYLENOL) tablet 650 mg  650 mg Oral Q4H PRN Jacolyn Reedy, MD      . omega-3 acid ethyl esters (LOVAZA) capsule 2 g  2 g Oral BID Jacolyn Reedy, MD      . ondansetron [facility]) 4 mg in sodium chloride 0.9 % 50 mL IVPB  4 mg Intravenous Q6H PRN Jacolyn Reedy, MD        Allergies  Allergen Reactions  . Macrobid [Nitrofurantoin Macrocrystal] Other (See Comments)    "I had chills &  fever"    ROS As per HPI  PE: Blood pressure 118/70, pulse 82, temperature 99.7 F (37.6 C), temperature source Oral, resp. rate 16, height 5\' 4"  (1.626  m), weight 122 lb 12 oz (55.679 kg), SpO2 95 %. Gen: Alert, well appearing.  Patient is oriented to person, place, time, and situation. AFFECT: pleasant, lucid thought and speech. No further exam today.  LABS:  Lab Results  Component Value Date   TSH 0.755 06/21/2015   Lab Results  Component Value Date   WBC 5.5 06/23/2015   HGB 12.0 06/23/2015   HCT 35.2* 06/23/2015   MCV 88.7 06/23/2015   PLT 141* 06/23/2015   Lab Results  Component Value Date   CREATININE 0.74 06/23/2015   BUN 12 06/23/2015   NA 133* 06/23/2015   K 4.0 06/23/2015   CL 102 06/23/2015   CO2 23 06/23/2015   Lab Results  Component Value Date   ALT 15 06/21/2015   AST 22 06/21/2015   ALKPHOS 54 06/21/2015   BILITOT 0.4 06/21/2015   Lab Results  Component Value Date   CHOL 171 06/21/2015   Lab Results  Component Value Date   HDL 71 06/21/2015   Lab Results  Component Value Date   LDLCALC 95 06/21/2015   Lab Results  Component Value Date   TRIG 26 06/21/2015   Lab Results  Component Value Date   CHOLHDL 2.4 06/21/2015   Lab Results  Component Value Date   HGBA1C 5.8* 05/30/2013    IMPRESSION AND PLAN:  1) HTN: The current medical regimen is effective;  continue present plan and medications.  2) HLD: tolerating statin.  Cholesterol panel and AST/ALT good 06/2015.  3) Wt loss: I really feel like she had inadequate intake after her husband died, then things leveled out. Wt has been stable the last 6-7 mo.  Reassured pt, encouraged adequate caloric intake. Will recheck TSH and CBC with iron labs.  4) Chronic fatigue: I think she likely is simply experiencing less energy due to age and can't do the things she could when younger.  As stated above, will check CBC w/iron labs, TSH, and vit B12 levels.  5) Hx of herpes zoster (06/2014).   Minimal post herpetic neuralgia pain.  She will continue neurontin 100mg , 1 tab qd.  I gave her a rx for zostavax to take to her pharmacy.  An After Visit Summary was printed and given to the patient.  Spent 30 min with pt today, with >50% of this time spent in counseling and care coordination regarding the above problems.  FOLLOW UP: Return in about 4 months (around 03/30/2016) for routine chronic illness f/u.  Signed:  Crissie Sickles, MD           11/29/2015

## 2015-11-29 NOTE — Progress Notes (Signed)
Pre visit review using our clinic review tool, if applicable. No additional management support is needed unless otherwise documented below in the visit note. 

## 2015-11-30 DIAGNOSIS — D509 Iron deficiency anemia, unspecified: Secondary | ICD-10-CM

## 2015-11-30 HISTORY — DX: Iron deficiency anemia, unspecified: D50.9

## 2015-11-30 LAB — CBC WITH DIFFERENTIAL/PLATELET
BASOS ABS: 0 10*3/uL (ref 0.0–0.1)
Basophils Relative: 0.3 % (ref 0.0–3.0)
EOS ABS: 0.1 10*3/uL (ref 0.0–0.7)
Eosinophils Relative: 1.7 % (ref 0.0–5.0)
HEMATOCRIT: 33.6 % — AB (ref 36.0–46.0)
Hemoglobin: 11.1 g/dL — ABNORMAL LOW (ref 12.0–15.0)
LYMPHS PCT: 27.8 % (ref 12.0–46.0)
Lymphs Abs: 1.5 10*3/uL (ref 0.7–4.0)
MCHC: 33 g/dL (ref 30.0–36.0)
MCV: 86.4 fl (ref 78.0–100.0)
MONO ABS: 0.2 10*3/uL (ref 0.1–1.0)
Monocytes Relative: 4.3 % (ref 3.0–12.0)
NEUTROS ABS: 3.4 10*3/uL (ref 1.4–7.7)
NEUTROS PCT: 65.9 % (ref 43.0–77.0)
PLATELETS: 155 10*3/uL (ref 150.0–400.0)
RBC: 3.88 Mil/uL (ref 3.87–5.11)
RDW: 14 % (ref 11.5–15.5)
WBC: 5.2 10*3/uL (ref 4.0–10.5)

## 2015-11-30 LAB — IRON AND TIBC
%SAT: 13 % (ref 11–50)
Iron: 41 ug/dL — ABNORMAL LOW (ref 45–160)
TIBC: 322 ug/dL (ref 250–450)
UIBC: 281 ug/dL (ref 125–400)

## 2015-11-30 LAB — IRON: IRON: 86 ug/dL (ref 42–145)

## 2015-11-30 LAB — TSH: TSH: 1.55 u[IU]/mL (ref 0.35–4.50)

## 2015-11-30 LAB — FERRITIN: FERRITIN: 12.3 ng/mL (ref 10.0–291.0)

## 2015-12-01 ENCOUNTER — Encounter: Payer: Self-pay | Admitting: Family Medicine

## 2015-12-03 ENCOUNTER — Encounter: Payer: Self-pay | Admitting: *Deleted

## 2015-12-03 ENCOUNTER — Other Ambulatory Visit: Payer: Self-pay | Admitting: *Deleted

## 2015-12-03 DIAGNOSIS — D500 Iron deficiency anemia secondary to blood loss (chronic): Secondary | ICD-10-CM | POA: Insufficient documentation

## 2015-12-03 DIAGNOSIS — D509 Iron deficiency anemia, unspecified: Secondary | ICD-10-CM

## 2015-12-14 ENCOUNTER — Encounter: Payer: Self-pay | Admitting: Family Medicine

## 2015-12-14 ENCOUNTER — Other Ambulatory Visit: Payer: Medicare Other

## 2015-12-14 DIAGNOSIS — D509 Iron deficiency anemia, unspecified: Secondary | ICD-10-CM

## 2015-12-14 LAB — HEMOCCULT SLIDES (X 3 CARDS)
Fecal Occult Blood: NEGATIVE
OCCULT 1: NEGATIVE
OCCULT 2: NEGATIVE
OCCULT 3: NEGATIVE
OCCULT 4: NEGATIVE
OCCULT 5: NEGATIVE

## 2015-12-28 ENCOUNTER — Other Ambulatory Visit (INDEPENDENT_AMBULATORY_CARE_PROVIDER_SITE_OTHER): Payer: Medicare Other

## 2015-12-28 DIAGNOSIS — D509 Iron deficiency anemia, unspecified: Secondary | ICD-10-CM | POA: Diagnosis not present

## 2015-12-28 LAB — CBC
HCT: 32.9 % — ABNORMAL LOW (ref 36.0–46.0)
Hemoglobin: 11.2 g/dL — ABNORMAL LOW (ref 12.0–15.0)
MCHC: 34.1 g/dL (ref 30.0–36.0)
MCV: 86.3 fl (ref 78.0–100.0)
PLATELETS: 138 10*3/uL — AB (ref 150.0–400.0)
RBC: 3.82 Mil/uL — ABNORMAL LOW (ref 3.87–5.11)
RDW: 14.4 % (ref 11.5–15.5)
WBC: 4.9 10*3/uL (ref 4.0–10.5)

## 2015-12-28 LAB — FERRITIN: FERRITIN: 22.3 ng/mL (ref 10.0–291.0)

## 2015-12-31 DIAGNOSIS — N3946 Mixed incontinence: Secondary | ICD-10-CM | POA: Diagnosis not present

## 2015-12-31 DIAGNOSIS — R351 Nocturia: Secondary | ICD-10-CM | POA: Diagnosis not present

## 2016-01-21 ENCOUNTER — Other Ambulatory Visit: Payer: Self-pay | Admitting: Family Medicine

## 2016-01-22 ENCOUNTER — Other Ambulatory Visit: Payer: Self-pay | Admitting: Family Medicine

## 2016-01-26 ENCOUNTER — Telehealth: Payer: Self-pay | Admitting: *Deleted

## 2016-01-26 NOTE — Telephone Encounter (Signed)
Pt advised and voiced understanding.  She stated that she is already taking the miralax twice a day but will start the [place] and will call in 2-3 days if no improvement.

## 2016-01-26 NOTE — Telephone Encounter (Signed)
Generic otc senakot S, 2 tabs every night. Miralax otc generic powder: 1 capful every morning.  May increase miralax to 1 capful morning and evening if not having soft/regular BMs in 2-3 days.

## 2016-01-26 NOTE — Telephone Encounter (Signed)
Pt Sabrina Alvarez on 01/26/16 at 9:13am stating that she has been extremely constipated and has stopped taking her iron supplement. She wanted to know what she could do to help with the constipation. Please advise. Thanks.

## 2016-02-02 ENCOUNTER — Telehealth: Payer: Self-pay | Admitting: Family Medicine

## 2016-02-02 NOTE — Telephone Encounter (Signed)
Left detailed message that there was no reason that I could tell for anyone to call patient.  Pt was called on the 23rd but she was advised at that time.  There are no other notes referring to any calls patient has received.

## 2016-02-02 NOTE — Telephone Encounter (Signed)
Patient states she is returning call to whomever called her from the office yesterday and left her a message to call back.  Pt states the caller did not leave a name and did not say what the call was about.  Please return call to patient and pt states ok to leave detailed message on cell phone voicemail if she does not answer as she has a doctor's appt today.

## 2016-02-03 ENCOUNTER — Encounter: Payer: Self-pay | Admitting: Family Medicine

## 2016-02-14 ENCOUNTER — Telehealth: Payer: Self-pay | Admitting: Family Medicine

## 2016-02-14 NOTE — Telephone Encounter (Signed)
100 mcg (micrograms) once daily.

## 2016-02-14 NOTE — Telephone Encounter (Signed)
Pt states she wants to begin taking b12 supplements and wants to know what strength she should start?  Please advise.

## 2016-02-15 NOTE — Telephone Encounter (Signed)
Patient aware.

## 2016-02-24 ENCOUNTER — Ambulatory Visit (INDEPENDENT_AMBULATORY_CARE_PROVIDER_SITE_OTHER): Payer: Medicare Other | Admitting: Family Medicine

## 2016-02-24 ENCOUNTER — Encounter: Payer: Self-pay | Admitting: Family Medicine

## 2016-02-24 VITALS — BP 144/76 | HR 55 | Temp 97.8°F | Resp 16 | Wt 122.4 lb

## 2016-02-24 DIAGNOSIS — I10 Essential (primary) hypertension: Secondary | ICD-10-CM | POA: Diagnosis not present

## 2016-02-24 DIAGNOSIS — K5900 Constipation, unspecified: Secondary | ICD-10-CM

## 2016-02-24 DIAGNOSIS — I200 Unstable angina: Secondary | ICD-10-CM | POA: Diagnosis not present

## 2016-02-24 DIAGNOSIS — D649 Anemia, unspecified: Secondary | ICD-10-CM

## 2016-02-24 DIAGNOSIS — Z23 Encounter for immunization: Secondary | ICD-10-CM | POA: Diagnosis not present

## 2016-02-24 DIAGNOSIS — R2689 Other abnormalities of gait and mobility: Secondary | ICD-10-CM | POA: Diagnosis not present

## 2016-02-24 DIAGNOSIS — K5909 Other constipation: Secondary | ICD-10-CM

## 2016-02-24 DIAGNOSIS — B370 Candidal stomatitis: Secondary | ICD-10-CM

## 2016-02-24 DIAGNOSIS — F09 Unspecified mental disorder due to known physiological condition: Secondary | ICD-10-CM | POA: Diagnosis not present

## 2016-02-24 MED ORDER — FLUCONAZOLE 150 MG PO TABS: ORAL_TABLET | ORAL | 0 refills | Status: DC

## 2016-02-24 MED ORDER — LUBIPROSTONE 24 MCG PO CAPS: 24.0000 ug | ORAL_CAPSULE | Freq: Two times a day (BID) | ORAL | 6 refills | Status: DC

## 2016-02-24 NOTE — Progress Notes (Signed)
Pre visit review using our clinic review tool, if applicable. No additional management support is needed unless otherwise documented below in the visit note. 

## 2016-02-24 NOTE — Progress Notes (Signed)
OFFICE VISIT  02/24/2016   CC:  Chief Complaint  Patient presents with  . Follow-up    wants to discuss Vit. B12, constipation, and hoarseness   HPI:    Patient is a 82 y.o. Caucasian female who presents for 3 mo f/u HTN.  Wants to discuss a few other things today: no longer taking iron, has a lot of constipation despite high fiber diet.  Taking miralax twice a day and senna S 2 tabs per day.  Says throat hurts, feels something in the back of her throat, has some PND (?not sure) but doesn't endorse many other allergy sx's.  Clears throat a lot, with rare cough.  Unclear how long these sx's have been going on but I get the idea it has been a couple of weeks at least.  Mild hoarseness of voice during this time.  No fevers, no signif URI sx's.  BPs: have been good at home. Compliant with bp meds.  Still tired a lot, complains of poor balance on her feet, has been reading about vit b12 def and it's link to dementia (she c/o memory impairment), asks for b12 level to be checked.    Past Medical History:  Diagnosis Date  . Abnormal finding on thyroid function test    per old records: T3 low, free T4 elevated, TSH normal.  Armour thyroid made her feel worse and T4 went up so med d/c'd and liothyronine 5 mcg tried but also made pt feel worse.  Endo (Dr. Loanne Drilling felt like Biotin was causing the abnormal TFT's).  Repeat TFTs OFF BIOTIN x 2 wks were NORMAL.  NO FURTHER THYROID TESTING NEEDED.  Marland Kitchen Anginal pain ([place])   . Arthritis    "think it's osteo; got some in my hands"  . Bladder infection, chronic 10/03/11   "have had one for over 1 yr"--has been on trimethaprim 100 mg qd since 2014  . CAD (coronary artery disease)    Cath 2009  Occluded LAD, 40-50% RCA and circ managed medically.  Repeat 06/22/15 no change.  . Chronic constipation    slow transit.  Barium enema to eval for colonic stricture 10/2014 was NORMAL  . Cognitive dysfunction    short term memory loss--takes ginko biloba  . GERD  (gastroesophageal reflux disease)    (also LPR) Schatzki's ring, s/p dil '97; food impaction 08/2010, on PPI therapy since w/out further sx so no dil performed  . History of herpes zoster 06/2015   right side of chest; presented with chest pain mimicking USA--cardiac eval showed stable CAD compared to 2009.  Marland Kitchen Hyperlipidemia   . Hypertension   . Hyponatremia 10/03/2011   Na 131 on labs 02/2015 at [facility]  . Iron deficiency anemia 11/30/15   Hemoccults neg x 3 12/2015.  ?Mild malabsorption?  . Mixed stress and urge urinary incontinence   . RBBB with left anterior fascicular block    bifascicular block  . 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 Thrombocytopenia ([place]) 02/2013   Plts 114K  . Uterine cancer ([place]) 2000    Past Surgical History:  Procedure Laterality Date  . ABDOMINAL HYSTERECTOMY  2000  . APPENDECTOMY  2000  . CARDIAC CATHETERIZATION N/A 06/22/2015   Stable single vessel CAD, EF normal.  Procedure: Left Heart Cath and Coronary Angiography;  Surgeon: Leonie Man, MD;  Location: [place] [facility];  Service: Cardiovascular;  Laterality: N/A;  . CATARACT EXTRACTION W/ INTRAOCULAR LENS  IMPLANT, BILATERAL  1990's  . [place]; 1960  . COLONOSCOPY  04/2001   Neg; pt cancelled screening colon 06/2011  . DILATION AND CURETTAGE OF UTERUS    . HERNIA REPAIR     abdominal "twice"  . LEFT HEART CATHETERIZATION WITH CORONARY ANGIOGRAM N/A 10/04/2011   Procedure: LEFT HEART CATHETERIZATION WITH CORONARY ANGIOGRAM;  Surgeon: Jacolyn Reedy, MD;  Location: [facility] CATH LAB;  Service: Cardiovascular;  Laterality: N/A;  . REPAIR KNEE LIGAMENT  ~ 2008   left  . TONSILLECTOMY     as a child  . TRANSTHORACIC ECHOCARDIOGRAM  05/2013   EF 60-65%, trivial aortic regurg, no wall motion abnorm    Outpatient Medications Prior to Visit  Medication Sig Dispense Refill  .  amLODipine (NORVASC) 5 MG tablet TAKE 1 TABLET BY MOUTH DAILY 90 tablet 3  . aspirin 325 MG tablet Take 1 tablet (325 mg total) by mouth daily.    . Cholecalciferol (VITAMIN D3) 1000 UNITS CAPS Take 1,000 Units by mouth daily.     Marland Kitchen co-enzyme Q-10 30 MG capsule Take 30 mg by mouth daily. Reported on 11/29/2015    . gabapentin (NEURONTIN) 100 MG capsule 1 tab po qAM, 1 tab po q afternoon, and 2 tabs po qhs 120 capsule 2  . Ginkgo Biloba 120 MG CAPS Take 1 capsule by mouth 3 (three) times daily.    Marland Kitchen glucosamine-chondroitin 500-400 MG tablet Take 1 tablet by mouth daily.    Marland Kitchen ibuprofen (ADVIL,MOTRIN) 200 MG tablet Take 400 mg by mouth 2 (two) times daily as needed for moderate pain.     Marland Kitchen losartan (COZAAR) 50 MG tablet Take 50 mg by mouth daily.  3  . nitroGLYCERIN (NITROSTAT) 0.4 MG SL tablet Place 1 tablet (0.4 mg total) under the tongue every 5 (five) minutes as needed for chest pain. 25 tablet 12  . Omega-3 Fatty Acids (FISH OIL) 1000 MG CAPS Take 1 capsule by mouth daily.    Marland Kitchen oxybutynin (DITROPAN) 5 MG tablet Take 2 tablets by mouth daily.  9  . pantoprazole (PROTONIX) 40 MG tablet Take 1 tablet (40 mg total) by mouth daily.    . polyethylene glycol (MIRALAX / GLYCOLAX) packet Take 17 g by mouth 2 (two) times daily. 72 each 11  . simvastatin (ZOCOR) 40 MG tablet TAKE 1 TABLET BY MOUTH ONCE A DAY 90 tablet 0  . trimethoprim (TRIMPEX) 100 MG tablet Take 100 mg by mouth daily.     Facility-Administered Medications Prior to Visit  Medication Dose Route Frequency Provider Last Rate Last Dose  . acetaminophen (TYLENOL) tablet 650 mg  650 mg Oral Q4H PRN Jacolyn Reedy, MD      . omega-3 acid ethyl esters (LOVAZA) capsule 2 g  2 g Oral BID Jacolyn Reedy, MD      . ondansetron [facility]) 4 mg in sodium chloride 0.9 % 50 mL IVPB  4 mg Intravenous Q6H PRN Jacolyn Reedy, MD        Allergies  Allergen Reactions  . Macrobid [Nitrofurantoin Macrocrystal] Other (See Comments)    "I had chills &  fever"    ROS As per HPI  PE: Blood pressure (!) 144/76, pulse (!) 55, temperature 97.8 F (36.6 C), temperature source Oral, resp. rate 16, weight 122 lb 6.4 oz (55.5 kg), SpO2 97 %. Gen: Alert, well appearing.  Patient is oriented to person,  place, time, and situation. [id number]: no injection, icteris, swelling, or exudate.  EOMI, PERRLA. Mouth: lips without lesion/swelling.  Oral mucosa pink and moist. Oropharynx with mild erythema and scattered small dots of white material --this can be seen disappearing down into pharynx.   Neck - No masses or thyromegaly or LAD. CV: RRR, no m/r/g.   LUNGS: CTA bilat, nonlabored resps, good aeration in all lung fields. EXT: no clubbing, cyanosis, or edema.   LABS:  Lab Results  Component Value Date   TSH 1.55 11/29/2015   Lab Results  Component Value Date   IRON 86 11/29/2015   IRON 41 (L) 11/29/2015   TIBC 322 11/29/2015   FERRITIN 22.3 12/28/2015    Lab Results  Component Value Date   WBC 4.9 12/28/2015   HGB 11.2 (L) 12/28/2015   HCT 32.9 (L) 12/28/2015   MCV 86.3 12/28/2015   PLT 138.0 (L) 12/28/2015   Lab Results  Component Value Date   CREATININE 0.74 06/23/2015   BUN 12 06/23/2015   NA 133 (L) 06/23/2015   K 4.0 06/23/2015   CL 102 06/23/2015   CO2 23 06/23/2015   Lab Results  Component Value Date   ALT 15 06/21/2015   AST 22 06/21/2015   ALKPHOS 54 06/21/2015   BILITOT 0.4 06/21/2015   Lab Results  Component Value Date   CHOL 171 06/21/2015   Lab Results  Component Value Date   HDL 71 06/21/2015   Lab Results  Component Value Date   LDLCALC 95 06/21/2015   Lab Results  Component Value Date   TRIG 26 06/21/2015   Lab Results  Component Value Date   CHOLHDL 2.4 06/21/2015   IMPRESSION AND PLAN:  1) HTN; The current medical regimen is effective;  continue present plan and medications.  2) Normocytic anemia: iron studies low-normal.  Hemoccults neg.   Check vit B12 today in light of this anemia,  her cognitive impairment, and poor balance.  3) Oropharyngeal thrush: diflucan 150mg  qd x 7d.  Stop simvastatin while taking this med.  4) Chronic constipation: start trial of amitiza 24 mcg bid, continue senna S 2 tabs qhs.  She can hold her miralax while trying amitiza.    An After Visit Summary was printed and given to the patient.  FOLLOW UP: Return in about 3 months (around 05/25/2016) for routine chronic illness f/u.  Signed:  Crissie Sickles, MD           02/24/2016

## 2016-02-24 NOTE — Patient Instructions (Signed)
DON'T TAKE SIMVASTATIN (ZOCOR) WHILE YOU ARE TAKING YOUR FLUCONAZOLE (THRUSH MEDICATION).

## 2016-02-25 ENCOUNTER — Other Ambulatory Visit: Payer: Self-pay | Admitting: Family Medicine

## 2016-02-25 ENCOUNTER — Telehealth: Payer: Self-pay | Admitting: Family Medicine

## 2016-02-25 DIAGNOSIS — K5909 Other constipation: Secondary | ICD-10-CM | POA: Insufficient documentation

## 2016-02-25 DIAGNOSIS — K5901 Slow transit constipation: Secondary | ICD-10-CM | POA: Insufficient documentation

## 2016-02-25 LAB — CBC WITH DIFFERENTIAL/PLATELET
BASOS ABS: 0 10*3/uL (ref 0.0–0.1)
BASOS PCT: 0.5 % (ref 0.0–3.0)
EOS ABS: 0.2 10*3/uL (ref 0.0–0.7)
Eosinophils Relative: 3.2 % (ref 0.0–5.0)
HEMATOCRIT: 34.7 % — AB (ref 36.0–46.0)
Hemoglobin: 11.9 g/dL — ABNORMAL LOW (ref 12.0–15.0)
LYMPHS ABS: 1.4 10*3/uL (ref 0.7–4.0)
LYMPHS PCT: 23.6 % (ref 12.0–46.0)
MCHC: 34.2 g/dL (ref 30.0–36.0)
MCV: 87.9 fl (ref 78.0–100.0)
MONO ABS: 0.3 10*3/uL (ref 0.1–1.0)
Monocytes Relative: 4.5 % (ref 3.0–12.0)
NEUTROS ABS: 4.1 10*3/uL (ref 1.4–7.7)
NEUTROS PCT: 68.2 % (ref 43.0–77.0)
PLATELETS: 144 10*3/uL — AB (ref 150.0–400.0)
RBC: 3.95 Mil/uL (ref 3.87–5.11)
RDW: 14.5 % (ref 11.5–15.5)
WBC: 5.9 10*3/uL (ref 4.0–10.5)

## 2016-02-25 LAB — VITAMIN B12: VITAMIN B 12: 680 pg/mL (ref 211–911)

## 2016-02-25 MED ORDER — LINACLOTIDE 290 MCG PO CAPS: 290.0000 ug | ORAL_CAPSULE | Freq: Every day | ORAL | 6 refills | Status: DC

## 2016-02-25 NOTE — Telephone Encounter (Signed)
Patient went to pick up medication last night. Rx was seen to [place], the pharmacy did the transfer. Please make sure her chart is marked [employer] for all future Rx. Patient did not fill Amatiza, the Rx is $500. Patient said the pharmacy said there is something we can do at our office to get the price lowered.

## 2016-02-25 NOTE — Telephone Encounter (Signed)
Pls notify pt that her insurer won't cover [place]. It looks like they might cover linzess for chronic constipation so I eRx'd this just now.-thx

## 2016-02-25 NOTE — Telephone Encounter (Signed)
Prior Josem Kaufmann has been received and submitted 02/25/16.  Awaiting response.

## 2016-02-25 NOTE — Telephone Encounter (Signed)
Left message for patient to return call.

## 2016-02-28 NOTE — Telephone Encounter (Signed)
Patient called back and I didn't see this message.  I just told her the lab results.  Please call her back.

## 2016-02-28 NOTE — Telephone Encounter (Signed)
Patient notified of new medication called to pharmacy.

## 2016-02-29 NOTE — Telephone Encounter (Signed)
Patient called and stated that the replacement prescription sent in [facility] was going to be $300 with patients insurance. She is wanting to see if there is a generic that can be sent that will be covered or that will not cost as much.

## 2016-02-29 NOTE — Telephone Encounter (Signed)
Sorry, no generic. I recommend she go back on the previous med regimen of miralax twice a day with senn S 2 tabs in eveing and 2 tabs in morning.-thx

## 2016-02-29 NOTE — Telephone Encounter (Signed)
Left message  For patient to return call

## 2016-03-01 NOTE — Telephone Encounter (Signed)
Left message for patient to return call.

## 2016-03-01 NOTE — Telephone Encounter (Signed)
Patient aware of returning to previous med regimen.

## 2016-03-16 DIAGNOSIS — H18223 Idiopathic corneal edema, bilateral: Secondary | ICD-10-CM | POA: Diagnosis not present

## 2016-03-23 ENCOUNTER — Telehealth: Payer: Self-pay | Admitting: Family Medicine

## 2016-03-23 NOTE — Telephone Encounter (Signed)
[facility] - Client [place] Patient Name: Sabrina Alvarez DOB: Dec 07, 1927 Initial Comment Caller stated that she is very constipated and has been taking miralax and stool softener and is still unable to use the bathroom. Nurse Assessment Nurse: Martyn Ehrich, RN, Felicia Date/Time (Eastern Time): 03/23/2016 4:32:05 PM Confirm and document reason for call. If symptomatic, describe symptoms. You must click the next button to save text entered. ---Pt is very constipated. She drank a bottle of magnesium citrate Tues. She has chronic constipation that comes and goes. She has not had BM in more than 4 d - except just a little. No fever Has the patient traveled out of the country within the last 30 days? ---No Does the patient have any new or worsening symptoms? ---Colbert Ewing a triage be completed? ---Yes Related visit to physician within the last 2 weeks? ---No Does the PT have any chronic conditions? (i.e. diabetes, asthma, etc.) ---Yes List chronic conditions. ---constipation, HTN Is this a behavioral health or substance abuse call? ---No Guidelines Guideline Title Affirmed Question Affirmed Notes Constipation Unable to have a bowel movement (BM) without laxative or enema Final Disposition User See PCP When Office is Open (within 3 days) Gaddy, RN, Felicia Comments stomach is not hurting at all today - after magnesium citrate she has had some BMs yesterday but it was mostly brown water with loose stool - No BM today - and she is on miralaxplease see her report later is different than preliminary stmt Was unable to get acute appointment to go in - called office and [name] said they give her a 30 min slot and will have to check with Dr. Rulon Sera nurse to see when to schedule - they can do that just send the record into epic and let pt know office will call her with appt time. Tried to reach caller and no answer reached  caller and told her office will call her with appointment time per [place] PCP OFFICEDisagree/Comply: Comply Call Id: [phone]

## 2016-03-24 ENCOUNTER — Telehealth: Payer: Self-pay | Admitting: Family Medicine

## 2016-03-24 NOTE — Telephone Encounter (Signed)
Noted  

## 2016-03-24 NOTE — Telephone Encounter (Signed)
Noted.  See telephone note dated today.

## 2016-03-24 NOTE — Telephone Encounter (Signed)
Pls call pt and tell her to get OTC fleets enema and take one now and if no result in 4 hours then repeat another fleets enema. Continue to take miralax and otc senakot S as previously directed.-thx

## 2016-03-24 NOTE — Telephone Encounter (Signed)
Patient is aware of instructions but is unsure she can perform the enema.  She states she will try.

## 2016-03-30 ENCOUNTER — Encounter: Payer: Self-pay | Admitting: Family Medicine

## 2016-03-30 ENCOUNTER — Ambulatory Visit (INDEPENDENT_AMBULATORY_CARE_PROVIDER_SITE_OTHER): Payer: Medicare Other | Admitting: Family Medicine

## 2016-03-30 VITALS — BP 137/73 | HR 83 | Temp 98.8°F | Resp 16 | Wt 120.8 lb

## 2016-03-30 DIAGNOSIS — K5909 Other constipation: Secondary | ICD-10-CM | POA: Diagnosis not present

## 2016-03-30 DIAGNOSIS — Z0000 Encounter for general adult medical examination without abnormal findings: Secondary | ICD-10-CM | POA: Diagnosis not present

## 2016-03-30 DIAGNOSIS — R413 Other amnesia: Secondary | ICD-10-CM | POA: Diagnosis not present

## 2016-03-30 DIAGNOSIS — I200 Unstable angina: Secondary | ICD-10-CM

## 2016-03-30 DIAGNOSIS — H9193 Unspecified hearing loss, bilateral: Secondary | ICD-10-CM

## 2016-03-30 NOTE — Progress Notes (Signed)
Pre visit review using our clinic review tool, if applicable. No additional management support is needed unless otherwise documented below in the visit note. 

## 2016-03-30 NOTE — Progress Notes (Signed)
OFFICE VISIT  03/30/2016   CC:  Chief Complaint  Patient presents with  . Follow-up    Discuss memory loss, allergies     HPI:    Patient is a 82 y.o. Caucasian female who presents for 5 week f/u.  We were unable to get amitiza or linzess approved for her constipation treatment but she is feeling like this is better lately.  Has bilat hearing imp, chronic, asks for referral to new audiology office so her insurance will pay for this.  C/o memory problems: onset gradually over the last few years, short term memory problems. Started taking gingko biloba.  She does not a rx med for this.  She also takes some OTC fish oil to try to help with memory.  She is not really interested in me evaluating her memory problems today but merely wants to know what I think of her using gingko biloba.     Past Medical History:  Diagnosis Date  . Abnormal finding on thyroid function test    per old records: T3 low, free T4 elevated, TSH normal.  Armour thyroid made her feel worse and T4 went up so med d/c'd and liothyronine 5 mcg tried but also made pt feel worse.  Endo (Dr. Loanne Drilling felt like Biotin was causing the abnormal TFT's).  Repeat TFTs OFF BIOTIN x 2 wks were NORMAL.  NO FURTHER THYROID TESTING NEEDED.  Marland Kitchen Anginal pain ([place])   . Arthritis    "think it's osteo; got some in my hands"  . Bladder infection, chronic 10/03/11   "have had one for over 1 yr"--has been on trimethaprim 100 mg qd since 2014  . CAD (coronary artery disease)    Cath 2009  Occluded LAD, 40-50% RCA and circ managed medically.  Repeat 06/22/15 no change.  . Chronic constipation    slow transit.  Barium enema to eval for colonic stricture 10/2014 was NORMAL  . Cognitive dysfunction    short term memory loss--takes ginko biloba  . GERD (gastroesophageal reflux disease)    (also LPR) Schatzki's ring, s/p dil '97; food impaction 08/2010, on PPI therapy since w/out further sx so no dil performed  . History of herpes zoster 06/2015    right side of chest; presented with chest pain mimicking [place]--cardiac eval showed stable CAD compared to 2009.  Marland Kitchen Hyperlipidemia   . Hypertension   . Hyponatremia 10/03/2011   Na 131 [facility] 02/[facility]  . Iron deficiency anemia 11/30/15   Hemoccults neg x 3 12/2015.  ?Mild malabsorption?  . Mixed stress and urge urinary incontinence   . RBBB with left anterior fascicular block    bifascicular block  . 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 Thrombocytopenia ([place]) 02/2013   Plts 114K  . Uterine cancer ([place]) 2000    Past Surgical History:  Procedure Laterality Date  . ABDOMINAL HYSTERECTOMY  2000  . APPENDECTOMY  2000  . CARDIAC CATHETERIZATION N/A 06/22/2015   Stable single vessel CAD, EF normal.  Procedure: Left Heart Cath and Coronary Angiography;  Surgeon: Leonie Man, MD;  Location: [place] [facility] LAB;  Service: Cardiovascular;  Laterality: N/A;  . CATARACT EXTRACTION W/ INTRAOCULAR LENS  IMPLANT, BILATERAL  1990's  . [place]; 1960  . COLONOSCOPY  04/2001   Neg; pt cancelled screening colon 06/2011  . DILATION AND CURETTAGE  OF UTERUS    . HERNIA REPAIR     abdominal "twice"  . LEFT HEART CATHETERIZATION WITH CORONARY ANGIOGRAM N/A 10/04/2011   Procedure: LEFT HEART CATHETERIZATION WITH CORONARY ANGIOGRAM;  Surgeon: Jacolyn Reedy, MD;  Location: [facility] CATH LAB;  Service: Cardiovascular;  Laterality: N/A;  . REPAIR KNEE LIGAMENT  ~ 2008   left  . TONSILLECTOMY     as a child  . TRANSTHORACIC ECHOCARDIOGRAM  05/2013   EF 60-65%, trivial aortic regurg, no wall motion abnorm   MEDS: pt not taking linzess listed below Outpatient Medications Prior to Visit  Medication Sig Dispense Refill  . amLODipine (NORVASC) 5 MG tablet TAKE 1 TABLET BY MOUTH DAILY 90 tablet 3  . aspirin 325 MG tablet Take 1 tablet (325 mg total) by mouth daily.    .  Cholecalciferol (VITAMIN D3) 1000 UNITS CAPS Take 1,000 Units by mouth daily.     Marland Kitchen co-enzyme Q-10 30 MG capsule Take 30 mg by mouth daily. Reported on 11/29/2015    . fluconazole (DIFLUCAN) 150 MG tablet 1 tab po qd x 7d 7 tablet 0  . gabapentin (NEURONTIN) 100 MG capsule 1 tab po qAM, 1 tab po q afternoon, and 2 tabs po qhs 120 capsule 2  . Ginkgo Biloba 120 MG CAPS Take 1 capsule by mouth 3 (three) times daily.    Marland Kitchen glucosamine-chondroitin 500-400 MG tablet Take 1 tablet by mouth daily.    Marland Kitchen ibuprofen (ADVIL,MOTRIN) 200 MG tablet Take 400 mg by mouth 2 (two) times daily as needed for moderate pain.     Marland Kitchen losartan (COZAAR) 50 MG tablet Take 50 mg by mouth daily.  3  . nitroGLYCERIN (NITROSTAT) 0.4 MG SL tablet Place 1 tablet (0.4 mg total) under the tongue every 5 (five) minutes as needed for chest pain. 25 tablet 12  . Omega-3 Fatty Acids (FISH OIL) 1000 MG CAPS Take 1 capsule by mouth daily.    Marland Kitchen oxybutynin (DITROPAN) 5 MG tablet Take 2 tablets by mouth daily.  9  . pantoprazole (PROTONIX) 40 MG tablet Take 1 tablet (40 mg total) by mouth daily.    . polyethylene glycol (MIRALAX / GLYCOLAX) packet Take 17 g by mouth 2 (two) times daily. 72 each 11  . simvastatin (ZOCOR) 40 MG tablet TAKE 1 TABLET BY MOUTH ONCE A DAY 90 tablet 0  . trimethoprim (TRIMPEX) 100 MG tablet Take 100 mg by mouth daily.    Marland Kitchen linaclotide (LINZESS) 290 MCG CAPS capsule Take 1 capsule (290 mcg total) by mouth daily before breakfast. 30 capsule 6   Facility-Administered Medications Prior to Visit  Medication Dose Route Frequency Provider Last Rate Last Dose  . acetaminophen (TYLENOL) tablet 650 mg  650 mg Oral Q4H PRN Jacolyn Reedy, MD      . omega-3 acid ethyl esters (LOVAZA) capsule 2 g  2 g Oral BID Jacolyn Reedy, MD      . ondansetron [facility]) 4 mg in sodium chloride 0.9 % 50 mL IVPB  4 mg Intravenous Q6H PRN Jacolyn Reedy, MD        Allergies  Allergen Reactions  . Macrobid [Nitrofurantoin Macrocrystal]  Other (See Comments)    "I had chills & fever"    ROS As per HPI  PE: Blood pressure 137/73, pulse 83, temperature 98.8 F (37.1 C), temperature source Temporal, resp. rate 16, weight 120 lb 12.8 oz (54.8 kg), SpO2 98 %. Gen: Alert, well appearing.  Patient is oriented to person,  place, time, and situation. AFFECT: pleasant, lucid thought and speech. No further exam today.  LABS:  None today  IMPRESSION AND PLAN:  1) Constipation: improved.  Continue miralax regimen.  2) Memory loss: suspect she has early alzheimer's dementia but I did not fully evaluate this problem today. I told her that taking gingko biloba was fine but I told her I didn't think it was going to help much.  3) Hearing loss, bilat, chronic: pt desired referral to new audiology office today so I referred her to [facility] so she can pursue getting some hearing aids.  4) Preventative health care: hx of shingles about 10 months ago. I gave her rx for zostavax for her to take to her pharmacy.  An After Visit Summary was printed and given to the patient.  FOLLOW UP: Return in about 3 months (around 06/30/2016) for routine chronic illness f/u.  Signed:  Crissie Sickles, MD           03/30/2016

## 2016-03-31 DIAGNOSIS — H1820 Unspecified corneal edema: Secondary | ICD-10-CM | POA: Diagnosis not present

## 2016-04-18 ENCOUNTER — Other Ambulatory Visit: Payer: Self-pay | Admitting: Family Medicine

## 2016-05-01 DIAGNOSIS — H1820 Unspecified corneal edema: Secondary | ICD-10-CM | POA: Diagnosis not present

## 2016-05-02 DIAGNOSIS — H903 Sensorineural hearing loss, bilateral: Secondary | ICD-10-CM | POA: Diagnosis not present

## 2016-05-15 DIAGNOSIS — H6123 Impacted cerumen, bilateral: Secondary | ICD-10-CM | POA: Diagnosis not present

## 2016-05-15 DIAGNOSIS — H903 Sensorineural hearing loss, bilateral: Secondary | ICD-10-CM | POA: Diagnosis not present

## 2016-05-17 ENCOUNTER — Other Ambulatory Visit: Payer: Self-pay | Admitting: Otolaryngology

## 2016-05-17 DIAGNOSIS — H903 Sensorineural hearing loss, bilateral: Secondary | ICD-10-CM

## 2016-05-26 ENCOUNTER — Encounter: Payer: Self-pay | Admitting: Family Medicine

## 2016-05-26 ENCOUNTER — Ambulatory Visit (INDEPENDENT_AMBULATORY_CARE_PROVIDER_SITE_OTHER): Payer: Medicare Other | Admitting: Family Medicine

## 2016-05-26 ENCOUNTER — Other Ambulatory Visit: Payer: Self-pay | Admitting: Family Medicine

## 2016-05-26 VITALS — BP 152/69 | HR 57 | Temp 98.2°F | Resp 16 | Ht 64.0 in | Wt 120.2 lb

## 2016-05-26 DIAGNOSIS — I200 Unstable angina: Secondary | ICD-10-CM

## 2016-05-26 DIAGNOSIS — R682 Dry mouth, unspecified: Secondary | ICD-10-CM | POA: Diagnosis not present

## 2016-05-26 DIAGNOSIS — K117 Disturbances of salivary secretion: Secondary | ICD-10-CM

## 2016-05-26 NOTE — Progress Notes (Signed)
OFFICE VISIT  05/26/2016   CC:  Chief Complaint  Patient presents with  . Dry mouth    has tried otc medications   HPI:    Patient is a 82 y.o. Caucasian female who presents for dry mouth.   Has been occurring about the last 3-4 weeks, says entire mouth and tongue are sore, some in her throat as well.  No correlation with any new med.  Xilitol lozenges and an otc oral rinse have been tried.  She has not tried biotene.  No eye dryness.   She does take oxybutynin 5mg  qAM and qhs.  She takes her gabapentin rarely--for shingles pain.  Past Medical History:  Diagnosis Date  . Abnormal finding on thyroid function test    per old records: T3 low, free T4 elevated, TSH normal.  Armour thyroid made her feel worse and T4 went up so med d/c'd and liothyronine 5 mcg tried but also made pt feel worse.  Endo (Dr. Loanne Drilling felt like Biotin was causing the abnormal TFT's).  Repeat TFTs OFF BIOTIN x 2 wks were NORMAL.  NO FURTHER THYROID TESTING NEEDED.  Marland Kitchen Anginal pain ([place])   . Arthritis    "think it's osteo; got some in my hands"  . Bladder infection, chronic 10/03/11   "have had one for over 1 yr"--has been on trimethaprim 100 mg qd since 2014  . CAD (coronary artery disease)    Cath 2009  Occluded LAD, 40-50% RCA and circ managed medically.  Repeat 06/22/15 no change.  . Chronic constipation    slow transit.  Barium enema to eval for colonic stricture 10/2014 was NORMAL  . Cognitive dysfunction    short term memory loss--takes ginko biloba  . GERD (gastroesophageal reflux disease)    (also LPR) Schatzki's ring, s/p dil '97; food impaction 08/2010, on PPI therapy since w/out further sx so no dil performed  . History of herpes zoster 06/2015   right side of chest; presented with chest pain mimicking USA--cardiac eval showed stable CAD compared to 2009.  Marland Kitchen Hyperlipidemia   . Hypertension   . Hyponatremia 10/03/2011   Na 131 on labs 02/2015 at [facility]  . Iron deficiency anemia 11/30/15   Hemoccults  neg x 3 12/2015.  ?Mild malabsorption?  . Mixed stress and urge urinary incontinence   . RBBB with left anterior fascicular block    bifascicular block  . 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 Thrombocytopenia ([place]) 02/2013   Plts 114K  . Uterine cancer ([place]) 2000    Past Surgical History:  Procedure Laterality Date  . ABDOMINAL HYSTERECTOMY  2000  . APPENDECTOMY  2000  . CARDIAC CATHETERIZATION N/A 06/22/2015   Stable single vessel CAD, EF normal.  Procedure: Left Heart Cath and Coronary Angiography;  Surgeon: Leonie Man, MD;  Location: [place] [facility];  Service: Cardiovascular;  Laterality: N/A;  . CATARACT EXTRACTION W/ INTRAOCULAR LENS  IMPLANT, BILATERAL  1990's  . [place]; 1960  . COLONOSCOPY  04/2001   Neg; pt cancelled screening colon 06/2011  . DILATION AND CURETTAGE OF UTERUS    . HERNIA REPAIR     abdominal "twice"  . LEFT HEART CATHETERIZATION WITH CORONARY ANGIOGRAM N/A 10/04/2011   Procedure: LEFT HEART CATHETERIZATION WITH CORONARY ANGIOGRAM;  Surgeon: Jacolyn Reedy, MD;  Location: [facility] CATH LAB;  Service: Cardiovascular;  Laterality: N/A;  . REPAIR KNEE LIGAMENT  ~ 2008   left  . TONSILLECTOMY     as a child  . TRANSTHORACIC ECHOCARDIOGRAM  05/2013   EF 60-65%, trivial aortic regurg, no wall motion abnorm    Outpatient Medications Prior to Visit  Medication Sig Dispense Refill  . amLODipine (NORVASC) 5 MG tablet TAKE 1 TABLET BY MOUTH DAILY 90 tablet 3  . aspirin 325 MG tablet Take 1 tablet (325 mg total) by mouth daily.    . Cholecalciferol (VITAMIN D3) 1000 UNITS CAPS Take 1,000 Units by mouth daily.     Marland Kitchen co-enzyme Q-10 30 MG capsule Take 30 mg by mouth daily. Reported on 11/29/2015    . gabapentin (NEURONTIN) 100 MG capsule 1 tab po qAM, 1 tab po q afternoon, and 2 tabs po qhs 120 capsule 2  . Ginkgo Biloba 120 MG CAPS Take 1  capsule by mouth 3 (three) times daily.    Marland Kitchen glucosamine-chondroitin 500-400 MG tablet Take 1 tablet by mouth daily.    Marland Kitchen ibuprofen (ADVIL,MOTRIN) 200 MG tablet Take 400 mg by mouth 2 (two) times daily as needed for moderate pain.     Marland Kitchen losartan (COZAAR) 50 MG tablet Take 50 mg by mouth daily.  3  . nitroGLYCERIN (NITROSTAT) 0.4 MG SL tablet Place 1 tablet (0.4 mg total) under the tongue every 5 (five) minutes as needed for chest pain. 25 tablet 12  . Omega-3 Fatty Acids (FISH OIL) 1000 MG CAPS Take 1 capsule by mouth daily.    Marland Kitchen oxybutynin (DITROPAN) 5 MG tablet Take 2 tablets by mouth daily.  9  . pantoprazole (PROTONIX) 40 MG tablet Take 1 tablet (40 mg total) by mouth daily.    . polyethylene glycol (MIRALAX / GLYCOLAX) packet Take 17 g by mouth 2 (two) times daily. 72 each 11  . simvastatin (ZOCOR) 40 MG tablet TAKE 1 TABLET BY MOUTH ONCE A DAY 90 tablet 1  . trimethoprim (TRIMPEX) 100 MG tablet Take 100 mg by mouth daily.    . fluconazole (DIFLUCAN) 150 MG tablet 1 tab po qd x 7d (Patient not taking: Reported on 05/26/2016) 7 tablet 0  . prednisoLONE acetate (PRED FORTE) 1 % ophthalmic suspension INSTILL 1 DROP INTO BOTH EYES QID FOR 1 WEEK THEN BID TIL GONE  0   Facility-Administered Medications Prior to Visit  Medication Dose Route Frequency Provider Last Rate Last Dose  . acetaminophen (TYLENOL) tablet 650 mg  650 mg Oral Q4H PRN Jacolyn Reedy, MD      . omega-3 acid ethyl esters (LOVAZA) capsule 2 g  2 g Oral BID Jacolyn Reedy, MD      . ondansetron [facility]) 4 mg in sodium chloride 0.9 % 50 mL IVPB  4 mg Intravenous Q6H PRN Jacolyn Reedy, MD        Allergies  Allergen Reactions  . Macrobid [Nitrofurantoin Macrocrystal] Other (See Comments)    "I had chills & fever"    ROS As per HPI  PE: Blood pressure (!) 152/69, pulse (!) 57, temperature 98.2 F (36.8 C), temperature source Oral, resp. rate 16, height 5\' 4"  (1.626 m), weight 120 lb 4 oz (54.5 kg), SpO2 99 %. Gen:  Alert, well appearing.  Patient is oriented to person, place, time, and situation. AFFECT: pleasant, lucid thought and speech. Parotid glands nontender and not enlarged.  Her submandibular glands are barely palpable. Oral exam shows dry-appearing tongue with some mild fissuring but gingiva, buccal mucosa, palate,  and posterior pharynx all appear moist and without lesion or swelling.  No exudate or plaque.   LABS:    Chemistry      Component Value Date/Time   NA 133 (L) 06/23/2015 0550   K 4.0 06/23/2015 0550   CL 102 06/23/2015 0550   CO2 23 06/23/2015 0550   BUN 12 06/23/2015 0550   CREATININE 0.74 06/23/2015 0550      Component Value Date/Time   CALCIUM 8.8 (L) 06/23/2015 0550   ALKPHOS 54 06/21/2015 1330   AST 22 06/21/2015 1330   ALT 15 06/21/2015 1330   BILITOT 0.4 06/21/2015 1330     Lab Results  Component Value Date   WBC 5.9 02/24/2016   HGB 11.9 (L) 02/24/2016   HCT 34.7 (L) 02/24/2016   MCV 87.9 02/24/2016   PLT 144.0 (L) 02/24/2016   Lab Results  Component Value Date   TSH 1.55 11/29/2015   IMPRESSION AND PLAN:  1) Xerostomia; suspect this is combination of age-related glandular atrophy plus side effect of her oxybutynin and gabapentin.  Will go ahead and check ANA and Sjogren's (SSA and SSB) antibodies. Instructions: Buy OTC Biotene oral rinse: use 3 tsp (1 capful) to swish, gargle, and spit THREE TIMES PER DAY.  An After Visit Summary was printed and given to the patient.  FOLLOW UP: Return if symptoms worsen or fail to improve.  Signed:  Crissie Sickles, MD           05/26/2016

## 2016-05-26 NOTE — Patient Instructions (Signed)
Buy OTC Biotene oral rinse: use 3 tsp (1 capful) to swish, gargle, and spit THREE TIMES PER DAY.

## 2016-05-26 NOTE — Progress Notes (Signed)
Pre visit review using our clinic review tool, if applicable. No additional management support is needed unless otherwise documented below in the visit note. 

## 2016-05-30 ENCOUNTER — Ambulatory Visit
Admission: RE | Admit: 2016-05-30 | Discharge: 2016-05-30 | Disposition: A | Discharge status: Home / Self Care | Payer: Medicare Other | Source: Ambulatory Visit | Attending: Otolaryngology | Admitting: Otolaryngology

## 2016-05-30 DIAGNOSIS — H903 Sensorineural hearing loss, bilateral: Secondary | ICD-10-CM | POA: Diagnosis not present

## 2016-05-30 MED ORDER — GADOBENATE DIMEGLUMINE 529 MG/ML IV SOLN: 10.0000 mL | Freq: Once | INTRAVENOUS | Status: DC | PRN

## 2016-05-31 LAB — FANA STAINING PATTERNS

## 2016-05-31 LAB — ANTINUCLEAR ANTIBODIES, IFA: ANA Titer 1: POSITIVE — AB

## 2016-06-01 ENCOUNTER — Telehealth: Payer: Self-pay | Admitting: Family Medicine

## 2016-06-01 NOTE — Telephone Encounter (Signed)
Sabrina Alvarez from [facility] called stating that they never received a specimen 05/26/16 for Sjogrens syndrome-B extractable nuclear antibody test.  It looks like all specimens were sent to [facility].   I contacted [facility] to see if the test could be added, [facility] can add test on and it is a one day turn around time.

## 2016-06-01 NOTE — Telephone Encounter (Signed)
Great. Thank you.

## 2016-06-04 LAB — SJOGRENS SYNDROME-B EXTRACTABLE NUCLEAR ANTIBODY

## 2016-06-04 LAB — SPECIMEN STATUS REPORT

## 2016-06-07 ENCOUNTER — Telehealth: Payer: Self-pay | Admitting: Family Medicine

## 2016-06-07 NOTE — Telephone Encounter (Signed)
Patient left voicemail on phone at front desk on 06/07/16 at 9:26am requesting refill of Miralax.  Patient states she does not want the envelopes but rather two bottles.  She report she is changing pharmacy from [place].  However, she did not advise [facility].    I returned call to patient and left a voicemail message for her to return call to office and notify us [facility].

## 2016-06-09 ENCOUNTER — Other Ambulatory Visit: Payer: Self-pay | Admitting: *Deleted

## 2016-06-09 MED ORDER — TRIMETHOPRIM 100 MG PO TABS: 100.0000 mg | ORAL_TABLET | Freq: Every day | ORAL | 0 refills | Status: DC

## 2016-06-09 MED ORDER — POLYETHYLENE GLYCOL 3350 17 G PO PACK: 17.0000 g | PACK | Freq: Two times a day (BID) | ORAL | 11 refills | Status: DC

## 2016-06-09 NOTE — Telephone Encounter (Signed)
Dr. Anitra Lauth pt.  Fax from [facility].  RF request for trimethoprim LOV: 02/24/16 Next ov: 06/22/16 Last written: unknown (fairly new pt)  Note on fax: Pt is out of this medication.  Please advise. Thanks.   ___________________________________ Rx Below has been sent to pharmacy.  RF request for polyethylene glycol  LOV: 03/30/16 Next ov: 06/22/16 Last written: 11/30/15 #72 w/ 11RF (sent to [facility])  Note on fax: Pt is out of this medication.

## 2016-06-22 ENCOUNTER — Ambulatory Visit: Payer: Medicare Other | Admitting: Family Medicine

## 2016-06-29 ENCOUNTER — Ambulatory Visit: Payer: Medicare Other | Admitting: Family Medicine

## 2016-06-29 DIAGNOSIS — E785 Hyperlipidemia, unspecified: Secondary | ICD-10-CM | POA: Diagnosis not present

## 2016-06-29 DIAGNOSIS — I25119 Atherosclerotic heart disease of native coronary artery with unspecified angina pectoris: Secondary | ICD-10-CM | POA: Diagnosis not present

## 2016-06-29 DIAGNOSIS — I10 Essential (primary) hypertension: Secondary | ICD-10-CM | POA: Diagnosis not present

## 2016-06-29 DIAGNOSIS — I452 Bifascicular block: Secondary | ICD-10-CM | POA: Diagnosis not present

## 2016-07-02 ENCOUNTER — Encounter: Payer: Self-pay | Admitting: Family Medicine

## 2016-07-11 ENCOUNTER — Emergency Department (HOSPITAL_BASED_OUTPATIENT_CLINIC_OR_DEPARTMENT_OTHER): Payer: Medicare Other

## 2016-07-11 ENCOUNTER — Other Ambulatory Visit: Payer: Self-pay | Admitting: Family Medicine

## 2016-07-11 ENCOUNTER — Encounter (HOSPITAL_BASED_OUTPATIENT_CLINIC_OR_DEPARTMENT_OTHER): Payer: Self-pay

## 2016-07-11 ENCOUNTER — Emergency Department (HOSPITAL_BASED_OUTPATIENT_CLINIC_OR_DEPARTMENT_OTHER)
Admission: EM | Admit: 2016-07-11 | Discharge: 2016-07-11 | Disposition: A | Discharge status: Home / Self Care | Payer: Medicare Other | Attending: Emergency Medicine | Admitting: Emergency Medicine

## 2016-07-11 DIAGNOSIS — Z79899 Other long term (current) drug therapy: Secondary | ICD-10-CM | POA: Insufficient documentation

## 2016-07-11 DIAGNOSIS — R55 Syncope and collapse: Secondary | ICD-10-CM | POA: Diagnosis not present

## 2016-07-11 DIAGNOSIS — Z8542 Personal history of malignant neoplasm of other parts of uterus: Secondary | ICD-10-CM | POA: Diagnosis not present

## 2016-07-11 DIAGNOSIS — E871 Hypo-osmolality and hyponatremia: Secondary | ICD-10-CM | POA: Insufficient documentation

## 2016-07-11 DIAGNOSIS — I2510 Atherosclerotic heart disease of native coronary artery without angina pectoris: Secondary | ICD-10-CM | POA: Insufficient documentation

## 2016-07-11 DIAGNOSIS — N342 Other urethritis: Secondary | ICD-10-CM | POA: Diagnosis not present

## 2016-07-11 DIAGNOSIS — H903 Sensorineural hearing loss, bilateral: Secondary | ICD-10-CM | POA: Diagnosis not present

## 2016-07-11 DIAGNOSIS — Z7982 Long term (current) use of aspirin: Secondary | ICD-10-CM | POA: Diagnosis not present

## 2016-07-11 DIAGNOSIS — H6123 Impacted cerumen, bilateral: Secondary | ICD-10-CM | POA: Diagnosis not present

## 2016-07-11 DIAGNOSIS — I10 Essential (primary) hypertension: Secondary | ICD-10-CM | POA: Insufficient documentation

## 2016-07-11 DIAGNOSIS — R42 Dizziness and giddiness: Secondary | ICD-10-CM | POA: Diagnosis present

## 2016-07-11 LAB — URINALYSIS, MICROSCOPIC (REFLEX)

## 2016-07-11 LAB — CBC
HCT: 36.2 % (ref 36.0–46.0)
Hemoglobin: 12.5 g/dL (ref 12.0–15.0)
MCH: 30.3 pg (ref 26.0–34.0)
MCHC: 34.5 g/dL (ref 30.0–36.0)
MCV: 87.9 fL (ref 78.0–100.0)
PLATELETS: 146 10*3/uL — AB (ref 150–400)
RBC: 4.12 MIL/uL (ref 3.87–5.11)
RDW: 12.8 % (ref 11.5–15.5)
WBC: 8.7 10*3/uL (ref 4.0–10.5)

## 2016-07-11 LAB — COMPREHENSIVE METABOLIC PANEL
ALBUMIN: 4.3 g/dL (ref 3.5–5.0)
ALT: 17 U/L (ref 14–54)
AST: 25 U/L (ref 15–41)
Alkaline Phosphatase: 54 U/L (ref 38–126)
Anion gap: 8 (ref 5–15)
BILIRUBIN TOTAL: 0.7 mg/dL (ref 0.3–1.2)
BUN: 18 mg/dL (ref 6–20)
CALCIUM: 8.8 mg/dL — AB (ref 8.9–10.3)
CO2: 25 mmol/L (ref 22–32)
CREATININE: 0.74 mg/dL (ref 0.44–1.00)
Chloride: 95 mmol/L — ABNORMAL LOW (ref 101–111)
GFR calc Af Amer: >60 mL/min (ref >60)
GLUCOSE: 170 mg/dL — AB (ref 65–99)
POTASSIUM: 3.8 mmol/L (ref 3.5–5.1)
Sodium: 128 mmol/L — ABNORMAL LOW (ref 135–145)
TOTAL PROTEIN: 7 g/dL (ref 6.5–8.1)

## 2016-07-11 LAB — URINALYSIS, ROUTINE W REFLEX MICROSCOPIC
Bilirubin Urine: NEGATIVE
Glucose, UA: NEGATIVE mg/dL
Ketones, ur: 15 mg/dL — AB
NITRITE: NEGATIVE
PH: 7 (ref 5.0–8.0)
Protein, ur: NEGATIVE mg/dL
Specific Gravity, Urine: 1.014 (ref 1.005–1.030)

## 2016-07-11 LAB — CBG MONITORING, ED: GLUCOSE-CAPILLARY: 169 mg/dL — AB (ref 65–99)

## 2016-07-11 LAB — TROPONIN I: Troponin I: <0.03 ng/mL (ref <0.03)

## 2016-07-11 MED ORDER — CEPHALEXIN 500 MG PO CAPS: 500.0000 mg | ORAL_CAPSULE | Freq: Three times a day (TID) | ORAL | 0 refills | Status: DC

## 2016-07-11 MED ORDER — CEPHALEXIN 250 MG PO CAPS
500.0000 mg | ORAL_CAPSULE | Freq: Once | ORAL | Status: AC
  Administered 2016-07-11: 500 mg via ORAL
  Filled 2016-07-11: qty 2

## 2016-07-11 MED ORDER — SODIUM CHLORIDE 0.9 % IV BOLUS (SEPSIS)
1000.0000 mL | Freq: Once | INTRAVENOUS | Status: AC
  Administered 2016-07-11: 1000 mL via INTRAVENOUS

## 2016-07-11 NOTE — Telephone Encounter (Signed)
[facility].  RF request for trimethoprim LOV: 03/30/16 Next ov: 07/20/16 Last written: 06/09/16 #30 w/ 0RF  Please advise. Thanks.

## 2016-07-11 NOTE — ED Provider Notes (Signed)
[facility] Provider Note   CSN: [id number] Arrival date & time: 07/11/16  1829  By signing my name below, I, Sabrina Alvarez, attest that this documentation has been prepared under the direction and in the presence of Drenda Freeze, MD. Electronically Signed: Jeanell Alvarez, Scribe. 07/11/2016. 7:58 PM.  History   Chief Complaint Chief Complaint  Patient presents with  . Near Syncope   The history is provided by the patient and the spouse. No language interpreter was used.   HPI Comments: Sabrina Alvarez is a 83 y.o. female who presents to the Emergency Department complaining of near-syncope that started about 3 hours ago. She states she was at the ENT doctor (for hearing aid) when she had sudden onset of off-balance dizziness and generalized weakness. No LOC. Her PO intake has decreased since tooth extraction yesterday. She denies any hx of vertigo, chest pain, cough, fever, abdominal pain, or other complaints.     PCP: Tammi Sou, MD  Past Medical History:  Diagnosis Date  . Abnormal finding on thyroid function test    per old records: T3 low, free T4 elevated, TSH normal.  Armour thyroid made her feel worse and T4 went up so med d/c'd and liothyronine 5 mcg tried but also made pt feel worse.  Endo (Dr. Loanne Drilling felt like Biotin was causing the abnormal TFT's).  Repeat TFTs OFF BIOTIN x 2 wks were NORMAL.  NO FURTHER THYROID TESTING NEEDED.  Marland Kitchen Anginal pain ([place])   . Arthritis    "think it's osteo; got some in my hands"  . Bladder infection, chronic 10/03/11   "have had one for over 1 yr"--has been on trimethaprim 100 mg qd since 2014  . CAD (coronary artery disease)    Cath 2009  Occluded LAD, 40-50% RCA and circ managed medically.  Repeat 06/22/15 no change.  . Chronic constipation    slow transit.  Barium enema to eval for colonic stricture 10/2014 was NORMAL  . Cognitive dysfunction    short term memory loss--takes ginko biloba  . GERD (gastroesophageal  reflux disease)    (also LPR) Schatzki's ring, s/p dil '97; food impaction 08/2010, on PPI therapy since w/out further sx so no dil performed  . History of herpes zoster 06/2015   right side of chest; presented with chest pain mimicking USA--cardiac eval showed stable CAD compared to 2009.  Marland Kitchen Hyperlipidemia   . Hypertension   . Hyponatremia 10/03/2011   Na 131 on labs 02/2015 at [facility]  . Iron deficiency anemia 11/30/15   Hemoccults neg x 3 12/2015.  ?Mild malabsorption?  . Mixed stress and urge urinary incontinence   . RBBB with left anterior fascicular block    bifascicular block  . 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 Thrombocytopenia ([place]) 02/2013   Plts 114K  . Uterine cancer ([place]) 2000  . Xerostomia 2017/2018   Age related glandular atrophy + med effect (oxybutynin and gabapentin)    Patient Active Problem List   Diagnosis Date Noted  . Chronic constipation 02/25/2016  . Iron deficiency anemia 12/03/2015  . Postherpetic neuralgia 09/01/2015  . Cerumen impaction 09/01/2015  . Thyroid disorder 05/26/2015  . Third nerve palsy 05/29/2013  . Essential hypertension   . Hyperlipidemia   . GERD (gastroesophageal reflux disease)   . History of malignant neoplasm of vagina   . Coronary artery  disease, occlusive;  100 % CTO of LAD  06/22/2007    Past Surgical History:  Procedure Laterality Date  . ABDOMINAL HYSTERECTOMY  2000  . APPENDECTOMY  2000  . CARDIAC CATHETERIZATION N/A 06/22/2015   Stable single vessel CAD, EF normal.  Procedure: Left Heart Cath and Coronary Angiography;  Surgeon: Leonie Man, MD;  Location: [place] [facility];  Service: Cardiovascular;  Laterality: N/A;  . CATARACT EXTRACTION W/ INTRAOCULAR LENS  IMPLANT, BILATERAL  1990's  . [place]; 1960  . COLONOSCOPY  04/2001   Neg; pt cancelled screening colon 06/2011  . DILATION AND  CURETTAGE OF UTERUS    . HERNIA REPAIR     abdominal "twice"  . LEFT HEART CATHETERIZATION WITH CORONARY ANGIOGRAM N/A 10/04/2011   Procedure: LEFT HEART CATHETERIZATION WITH CORONARY ANGIOGRAM;  Surgeon: Jacolyn Reedy, MD;  Location: [facility] CATH LAB;  Service: Cardiovascular;  Laterality: N/A;  . REPAIR KNEE LIGAMENT  ~ 2008   left  . TONSILLECTOMY     as a child  . TRANSTHORACIC ECHOCARDIOGRAM  05/2013   EF 60-65%, trivial aortic regurg, no wall motion abnorm    OB History    No data available       Home Medications    Prior to Admission medications   Medication Sig Start Date End Date Taking? Authorizing Provider  amLODipine (NORVASC) 5 MG tablet TAKE 1 TABLET BY MOUTH DAILY 09/09/15   Tammi Sou, MD  aspirin 325 MG tablet Take 1 tablet (325 mg total) by mouth daily. 05/31/13   Geradine Girt, DO  Cholecalciferol (VITAMIN D3) 1000 UNITS CAPS Take 1,000 Units by mouth daily.     Historical Provider, MD  co-enzyme Q-10 30 MG capsule Take 30 mg by mouth daily. Reported on 11/29/2015    Historical Provider, MD  gabapentin (NEURONTIN) 100 MG capsule 1 tab po qAM, 1 tab po q afternoon, and 2 tabs po qhs 09/01/15   Tammi Sou, MD  Ginkgo Biloba 120 MG CAPS Take 1 capsule by mouth 3 (three) times daily.    Historical Provider, MD  glucosamine-chondroitin 500-400 MG tablet Take 1 tablet by mouth daily.    Historical Provider, MD  ibuprofen (ADVIL,MOTRIN) 200 MG tablet Take 400 mg by mouth 2 (two) times daily as needed for moderate pain.     Historical Provider, MD  losartan (COZAAR) 50 MG tablet Take 50 mg by mouth daily. 06/10/15   Historical Provider, MD  nitroGLYCERIN (NITROSTAT) 0.4 MG SL tablet Place 1 tablet (0.4 mg total) under the tongue every 5 (five) minutes as needed for chest pain. 06/23/15   Jacolyn Reedy, MD  Omega-3 Fatty Acids (FISH OIL) 1000 MG CAPS Take 1 capsule by mouth daily.    Historical Provider, MD  oxybutynin (DITROPAN) 5 MG tablet Take 2 tablets by mouth  daily. 10/06/15   Historical Provider, MD  pantoprazole (PROTONIX) 40 MG tablet Take 1 tablet (40 mg total) by mouth daily. 10/04/11   Jacolyn Reedy, MD  polyethylene glycol [facility] Floria Raveling) packet Take 17 g by mouth 2 (two) times daily. 06/09/16   Tammi Sou, MD  simvastatin (ZOCOR) 40 MG tablet TAKE 1 TABLET BY MOUTH ONCE A DAY 04/18/16   Tammi Sou, MD  trimethoprim (TRIMPEX) 100 MG tablet TAKE 1 TABLET (100 MG TOTAL) BY MOUTH DAILY. 07/11/16   Tammi Sou, MD    Family History Family History  Problem Relation Age of Onset  .  Arthritis Mother   . Diabetes Sister   . Rheum arthritis Sister   . Cancer Neg Hx   . Heart disease Neg Hx   . Thyroid disease Neg Hx     Social History Social History  Substance Use Topics  . Smoking status: Never Smoker  . Smokeless tobacco: Never Used  . Alcohol use No     Allergies   Macrobid [nitrofurantoin macrocrystal]   Review of Systems Review of Systems  Constitutional: Negative for fever.  Respiratory: Negative for cough.   Cardiovascular: Positive for near-syncope. Negative for chest pain.  Gastrointestinal: Negative for abdominal pain.  Neurological: Positive for dizziness (Off-balance) and weakness (Generalized). Negative for syncope.  All other systems reviewed and are negative.    Physical Exam Updated Vital Signs BP 164/74 (BP Location: Left Arm)   Pulse 75   Temp 98 F (36.7 C) (Oral)   Resp 20   Ht 5' 5.5" (1.664 m)   Wt 124 lb (56.2 kg)   SpO2 99%   BMI 20.32 kg/m   Physical Exam  Constitutional: She appears well-developed and well-nourished. No distress.  HENT:  Head: Normocephalic.  Left bottom canine is removed. No periapical abscess. Mucous membranes are slightly dry.   Eyes: Conjunctivae are normal.  Neck: Neck supple.  No cervical lymphadenopathy.  Cardiovascular: Normal rate and regular rhythm.   Pulmonary/Chest: Effort normal.  Abdominal: Soft.  Musculoskeletal: Normal range of  motion.  Lymphadenopathy:    She has no cervical adenopathy.  Neurological: She is alert.  Skin: Skin is warm and dry.  Psychiatric: She has a normal mood and affect.  Nursing note and vitals reviewed.    ED Treatments / Results  DIAGNOSTIC STUDIES: Oxygen Saturation is 99% on RA, normal by my interpretation.    COORDINATION OF CARE: 8:02 PM- Pt advised of plan for treatment and pt agrees.  Labs (all labs ordered are listed, but only abnormal results are displayed) Labs Reviewed  COMPREHENSIVE METABOLIC PANEL - Abnormal; Notable for the following:       Result Value   Sodium 128 (*)    Chloride 95 (*)    Glucose, Bld 170 (*)    Calcium 8.8 (*)    All other components within normal limits  CBC - Abnormal; Notable for the following:    Platelets 146 (*)    All other components within normal limits  URINALYSIS, ROUTINE W REFLEX MICROSCOPIC - Abnormal; Notable for the following:    APPearance CLOUDY (*)    Hgb urine dipstick SMALL (*)    Ketones, ur 15 (*)    Leukocytes, UA LARGE (*)    All other components within normal limits  URINALYSIS, MICROSCOPIC (REFLEX) - Abnormal; Notable for the following:    Bacteria, UA FEW (*)    Squamous Epithelial / LPF 0-5 (*)    All other components within normal limits  CBG MONITORING, ED - Abnormal; Notable for the following:    Glucose-Capillary 169 (*)    All other components within normal limits  TROPONIN I    EKG  EKG Interpretation  Date/Time:  Tuesday July 11 2016 19:06:03 EST Ventricular Rate:  77 PR Interval:    QRS Duration: 149 QT Interval:  462 QTC Calculation: 523 R Axis:   -68 Text Interpretation:  Sinus rhythm Prolonged PR interval Right bundle branch block Inferior infarct, age indeterminate No significant change since last tracing Confirmed by YAO  MD, DAVID ([id number]) on 07/11/2016 9:16:15 PM  Radiology Ct Head Wo Contrast  Result Date: 07/11/2016 CLINICAL DATA:  Feeling faint all day with near syncope  EXAM: CT HEAD WITHOUT CONTRAST TECHNIQUE: Contiguous axial images were obtained from the base of the skull through the vertex without intravenous contrast. COMPARISON:  MRI 05/30/2016 CT brain without contrast 05/29/2013 FINDINGS: Brain: No acute territorial infarction, intracranial hemorrhage or focal mass lesion is visualized. Mild to moderate periventricular and deep white matter small vessel ischemic changes. Old lacunar infarct in the right basal ganglia. Mild to moderate atrophy. Stable ventricle size. Vascular: No hyperdense vessels. Scattered calcifications at the carotid siphon Skull: No fracture.  Mastoid air cells grossly clear. Sinuses/Orbits: No acute abnormality Other: None IMPRESSION: 1. No CT evidence for acute intracranial abnormality. 2. Mild to moderate periventricular and deep white matter small vessel ischemic changes. Electronically Signed   By: Donavan Foil M.D.   On: 07/11/2016 19:39    Procedures Procedures (including critical care time)  Medications Ordered in ED Medications  sodium chloride 0.9 % bolus 1,000 mL (1,000 mLs Intravenous New Bag/Given 07/11/16 2015)     Initial Impression / Assessment and Plan / ED Course  I have reviewed the triage vital signs and the nursing notes.  Pertinent labs & imaging results that were available during my care of the patient were reviewed by me and considered in my medical decision making (see chart for details).     MELVENE ESKOLA is a 83 y.o. female here with dizziness, light headedness. No slurred speech, nl strength throughout, nl neuro exam. Had tooth extraction yesterday and had dec PO intake. Not orthostatic. Labs showed Na 128, baseline around 133, likely from dehydration. UA + UTI. No flank pain or abdominal pain to suggest pyelo. Given 1 L NS bolus and felt better. Ambulated with no issues. Will dc home with keflex, recheck BMP in a week with PCP.   Final Clinical Impressions(s) / ED Diagnoses   Final diagnoses:  None     New Prescriptions New Prescriptions   No medications on file   I personally performed the services described in this documentation, which was scribed in my presence. The recorded information has been reviewed and is accurate.     Drenda Freeze, MD 07/11/16 2120

## 2016-07-11 NOTE — ED Notes (Signed)
Patient ambulatory to the restroom with steady gait and no distress noted. Patient changed pants and asked her her under ware to be thrown out. The patient ambulatory back to her room and back to bed without incident

## 2016-07-11 NOTE — ED Notes (Signed)
Too much artifact in EKG in triage-EKG to be done in exam room-pt taken to exam room 9 via w/c

## 2016-07-11 NOTE — Discharge Instructions (Signed)
Stay hydrated.   Take keflex three times daily for 5 days for UTI.   Repeat BMP in a week with your doctor.   See your doctor  Return to ER if you have worse dizziness, passing out, chest pain, weakness.

## 2016-07-11 NOTE — ED Triage Notes (Signed)
Pt taken to BR by nursefirst. Urine sample collected and taken to lab. Pt able to stand and walk to toilet. Reports she just came from an ear doctor appt

## 2016-07-11 NOTE — ED Triage Notes (Addendum)
Pt c/o near syncopal episode, dizziness, weakness started approx 430pm while at the ear doctor (r/t hearing aides)-pt was brought to ED by son-denies pain, flu like s/s-presents to triage in w/c-NAD-A/O-answering all ?s appropriately

## 2016-07-14 ENCOUNTER — Telehealth: Payer: Self-pay | Admitting: Family Medicine

## 2016-07-14 LAB — URINE CULTURE

## 2016-07-14 NOTE — Telephone Encounter (Signed)
SW pt and advised her that she will need to schedule an apt for f/u ER visit with Dr. Anitra Lauth which will allow him to review her ER visit and answer any questions or concerns she may have. Pt agreed and apt was made for 07/17/16 at 3:45pm.

## 2016-07-14 NOTE — Telephone Encounter (Signed)
Patient was discharged with UTI and low sodium issues and has concerns regarding prior labs and prescription she was given.  Thanks.  -LL

## 2016-07-17 ENCOUNTER — Ambulatory Visit (INDEPENDENT_AMBULATORY_CARE_PROVIDER_SITE_OTHER): Payer: Medicare Other | Admitting: Family Medicine

## 2016-07-17 ENCOUNTER — Encounter: Payer: Self-pay | Admitting: Family Medicine

## 2016-07-17 VITALS — BP 161/80 | HR 59 | Temp 98.6°F | Resp 16 | Ht 64.0 in | Wt 123.2 lb

## 2016-07-17 DIAGNOSIS — E860 Dehydration: Secondary | ICD-10-CM | POA: Diagnosis not present

## 2016-07-17 DIAGNOSIS — E871 Hypo-osmolality and hyponatremia: Secondary | ICD-10-CM | POA: Diagnosis not present

## 2016-07-17 DIAGNOSIS — N3001 Acute cystitis with hematuria: Secondary | ICD-10-CM

## 2016-07-17 LAB — BASIC METABOLIC PANEL
BUN: 16 mg/dL (ref 7–25)
CO2: 28 mmol/L (ref 20–31)
CREATININE: 0.85 mg/dL (ref 0.60–0.88)
Calcium: 8.9 mg/dL (ref 8.6–10.4)
Chloride: 98 mmol/L (ref 98–110)
Glucose, Bld: 111 mg/dL — ABNORMAL HIGH (ref 65–99)
POTASSIUM: 5.2 mmol/L (ref 3.5–5.3)
Sodium: 132 mmol/L — ABNORMAL LOW (ref 135–146)

## 2016-07-17 LAB — POCT URINALYSIS DIPSTICK
Bilirubin, UA: NEGATIVE
Glucose, UA: NEGATIVE
Ketones, UA: NEGATIVE
Leukocytes, UA: NEGATIVE
Nitrite, UA: NEGATIVE
PROTEIN UA: NEGATIVE
RBC UA: NEGATIVE
SPEC GRAV UA: 1.015
UROBILINOGEN UA: 0.2
pH, UA: 7

## 2016-07-17 NOTE — Progress Notes (Signed)
Pre visit review using our clinic review tool, if applicable. No additional management support is needed unless otherwise documented below in the visit note. 

## 2016-07-17 NOTE — Progress Notes (Signed)
OFFICE VISIT  07/17/2016   CC:  Chief Complaint  Patient presents with  . Follow-up    ER visit     HPI:    Patient is a 83 y.o. Caucasian female who presents for ED f/u from a visit 07/11/16 [facility]. Complaint was near syncope.  Reviewed ED eval today.  UA abnl, Na 128, gluc 170, EKG showed sinus rhythm with prolonged PR interval and RBBB, inf infarct age undetermined---unchanged since last tracing.  CT head w/out contrast showed no CT evidence for acute intracranial abnormality, with mild to moderate periventricular and deep white matter small vessel ischemic changes.  She was given 1L IVF bolus and felt better, was d/c'd home on keflex.   She took the last of her keflex today.  She felt burning x 1 day prior to her ED presentation.  She has had none since.  She feels significantly improved.  Energy level is up.  No further dizziness/lethargy or presyncopal feeling.   Past Medical History:  Diagnosis Date  . Abnormal finding on thyroid function test    per old records: T3 low, free T4 elevated, TSH normal.  Armour thyroid made her feel worse and T4 went up so med d/c'd and liothyronine 5 mcg tried but also made pt feel worse.  Endo (Dr. Loanne Drilling felt like Biotin was causing the abnormal TFT's).  Repeat TFTs OFF BIOTIN x 2 wks were NORMAL.  NO FURTHER THYROID TESTING NEEDED.  Marland Kitchen Anginal pain ([place])   . Arthritis    "think it's osteo; got some in my hands"  . Bladder infection, chronic 10/03/11   "have had one for over 1 yr"--has been on trimethaprim 100 mg qd since 2014  . CAD (coronary artery disease)    Cath 2009  Occluded LAD, 40-50% RCA and circ managed medically.  Repeat 06/22/15 no change.  . Chronic constipation    slow transit.  Barium enema to eval for colonic stricture 10/2014 was NORMAL  . Cognitive dysfunction    short term memory loss--takes ginko biloba  . GERD (gastroesophageal reflux disease)    (also LPR) Schatzki's ring, s/p dil '97; food impaction 08/2010, on  PPI therapy since w/out further sx so no dil performed  . History of herpes zoster 06/2015   right side of chest; presented with chest pain mimicking [place]--cardiac eval showed stable CAD compared to 2009.  Marland Kitchen Hyperlipidemia   . Hypertension   . Hyponatremia 10/03/2011   Na 131 on labs 02/2015 at [facility]  . Iron deficiency anemia 11/30/15   Hemoccults neg x 3 12/2015.  ?Mild malabsorption?  . Mixed stress and urge urinary incontinence   . RBBB with left anterior fascicular block    bifascicular block  . 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 Thrombocytopenia ([place]) 02/2013   Plts 114K  . Uterine cancer ([place]) 2000  . Xerostomia 2017/2018   Age related glandular atrophy + med effect (oxybutynin and gabapentin)    Past Surgical History:  Procedure Laterality Date  . ABDOMINAL HYSTERECTOMY  2000  . APPENDECTOMY  2000  . CARDIAC CATHETERIZATION N/A 06/22/2015   Stable single vessel CAD, EF normal.  Procedure: Left Heart Cath and Coronary Angiography;  Surgeon: Leonie Man, MD;  Location: [place] [facility];  Service: Cardiovascular;  Laterality: N/A;  . CATARACT EXTRACTION W/ INTRAOCULAR LENS  IMPLANT, BILATERAL  1990's  . [place]; 1960  . COLONOSCOPY  04/2001   Neg; pt cancelled screening colon 06/2011  . DILATION AND CURETTAGE OF UTERUS    . HERNIA REPAIR     abdominal "twice"  . LEFT HEART CATHETERIZATION WITH CORONARY ANGIOGRAM N/A 10/04/2011   Procedure: LEFT HEART CATHETERIZATION WITH CORONARY ANGIOGRAM;  Surgeon: Jacolyn Reedy, MD;  Location: [facility] CATH LAB;  Service: Cardiovascular;  Laterality: N/A;  . REPAIR KNEE LIGAMENT  ~ 2008   left  . TONSILLECTOMY     as a child  . TRANSTHORACIC ECHOCARDIOGRAM  05/2013   EF 60-65%, trivial aortic regurg, no wall motion abnorm    Outpatient Medications Prior to Visit  Medication Sig Dispense Refill  . amLODipine  (NORVASC) 5 MG tablet TAKE 1 TABLET BY MOUTH DAILY 90 tablet 3  . aspirin 325 MG tablet Take 1 tablet (325 mg total) by mouth daily.    . cephALEXin (KEFLEX) 500 MG capsule Take 1 capsule (500 mg total) by mouth 3 (three) times daily. 15 capsule 0  . Cholecalciferol (VITAMIN D3) 1000 UNITS CAPS Take 1,000 Units by mouth daily.     Marland Kitchen co-enzyme Q-10 30 MG capsule Take 30 mg by mouth daily. Reported on 11/29/2015    . gabapentin (NEURONTIN) 100 MG capsule 1 tab po qAM, 1 tab po q afternoon, and 2 tabs po qhs 120 capsule 2  . Ginkgo Biloba 120 MG CAPS Take 1 capsule by mouth 3 (three) times daily.    Marland Kitchen glucosamine-chondroitin 500-400 MG tablet Take 1 tablet by mouth daily.    Marland Kitchen ibuprofen (ADVIL,MOTRIN) 200 MG tablet Take 400 mg by mouth 2 (two) times daily as needed for moderate pain.     Marland Kitchen losartan (COZAAR) 50 MG tablet Take 50 mg by mouth daily.  3  . nitroGLYCERIN (NITROSTAT) 0.4 MG SL tablet Place 1 tablet (0.4 mg total) under the tongue every 5 (five) minutes as needed for chest pain. 25 tablet 12  . Omega-3 Fatty Acids (FISH OIL) 1000 MG CAPS Take 1 capsule by mouth daily.    Marland Kitchen oxybutynin (DITROPAN) 5 MG tablet Take 2 tablets by mouth daily.  9  . pantoprazole (PROTONIX) 40 MG tablet Take 1 tablet (40 mg total) by mouth daily.    . polyethylene glycol (MIRALAX / GLYCOLAX) packet Take 17 g by mouth 2 (two) times daily. 72 each 11  . simvastatin (ZOCOR) 40 MG tablet TAKE 1 TABLET BY MOUTH ONCE A DAY 90 tablet 1  . trimethoprim (TRIMPEX) 100 MG tablet TAKE 1 TABLET (100 MG TOTAL) BY MOUTH DAILY. 90 tablet 3   Facility-Administered Medications Prior to Visit  Medication Dose Route Frequency Provider Last Rate Last Dose  . acetaminophen (TYLENOL) tablet 650 mg  650 mg Oral Q4H PRN Jacolyn Reedy, MD      . omega-3 acid ethyl esters (LOVAZA) capsule 2 g  2 g Oral BID Jacolyn Reedy, MD      . ondansetron [facility]) 4 mg in sodium chloride 0.9 % 50 mL IVPB  4 mg Intravenous Q6H PRN Jacolyn Reedy, MD        Allergies  Allergen Reactions  . Macrobid [Nitrofurantoin Macrocrystal] Other (See Comments)    "I had chills & fever"    ROS As per HPI  PE: Blood pressure (!) 161/80, pulse (!) 59, temperature 98.6 F (37 C), temperature source Oral, resp. rate 16, height 5\' 4"  (1.626 m), weight 123 lb 4 oz (  55.9 kg), SpO2 99 %. Gen: Alert, well appearing.  Patient is oriented to person, place, time, and situation. AFFECT: pleasant, lucid thought and speech. [id number]: no injection, icteris, swelling, or exudate.  EOMI, PERRLA. Mouth: lips without lesion/swelling.  Oral mucosa pink and moist. Oropharynx without erythema, exudate, or swelling.  CV: RRR, soft systolic murmur, no rub or gallop.   LUNGS: CTA bilat, nonlabored resps, good aeration in all lung fields. ABD: soft NT/ND EXT: no clubbing, cyanosis, or edema.   LABS:  See HPI; also Urine clx from 07/11/16: >100K col/ml enterococcus faecalis, sensitive to ampicillin, levo, nitrofuran, and vanco.  CC UA today was completely NORMAL.  IMPRESSION AND PLAN:  1) UTI: this likely was the cause of her malaise, disequilibrium, fatigue recently that made her present to the ED. UA today clear s/p a course of keflex.  No further meds needed.  Pt w/out any urinary sx's.  2) Dehydration (relative): low Na may have been associated with this. We'll recheck BMET today.  She has gained a few lbs and looks well hydrated now.  An After Visit Summary was printed and given to the patient.  FOLLOW UP: Return for cancel 07/20/16 appt and f/u 3 mo for routine chronic illness f/u.  Signed:  Crissie Sickles, MD           07/17/2016

## 2016-07-18 ENCOUNTER — Encounter: Payer: Self-pay | Admitting: Family Medicine

## 2016-07-20 ENCOUNTER — Ambulatory Visit (INDEPENDENT_AMBULATORY_CARE_PROVIDER_SITE_OTHER): Payer: Medicare Other | Admitting: Family Medicine

## 2016-07-20 ENCOUNTER — Encounter: Payer: Self-pay | Admitting: Family Medicine

## 2016-07-20 VITALS — BP 156/70 | HR 58 | Temp 98.3°F | Resp 16 | Ht 64.0 in | Wt 126.8 lb

## 2016-07-20 DIAGNOSIS — I10 Essential (primary) hypertension: Secondary | ICD-10-CM

## 2016-07-20 DIAGNOSIS — E7800 Pure hypercholesterolemia, unspecified: Secondary | ICD-10-CM | POA: Diagnosis not present

## 2016-07-20 DIAGNOSIS — Z23 Encounter for immunization: Secondary | ICD-10-CM | POA: Diagnosis not present

## 2016-07-20 MED ORDER — HYDROCHLOROTHIAZIDE 12.5 MG PO CAPS: 12.5000 mg | ORAL_CAPSULE | Freq: Every day | ORAL | 1 refills | Status: DC

## 2016-07-20 NOTE — Progress Notes (Signed)
OFFICE VISIT  07/20/2016   CC:  Chief Complaint  Patient presents with  . Follow-up    RCI     HPI:    Patient is a 83 y.o. Caucasian female who presents for f/u HTN, hyperlipidemia.  HTN: home bp monitoring being done regularly but pt has difficulty recollecting many numbers.   No HAs, no dizziness.  Feels a bit fatigued after lunch time, needs to rest in afternoons--this is just in the last year or so.  Exercise: stationary bike most nights but not lately since she strained her R shoulder recently. Likes walking outside when weather permits.  HLD: takes simv2astatin 40mg  qd.  Tolerating this med fine.  Past Medical History:  Diagnosis Date  . Abnormal finding on thyroid function test    per old records: T3 low, free T4 elevated, TSH normal.  [place] thyroid made her feel worse and T4 went up so med d/c'd and liothyronine 5 mcg tried but also made pt feel worse.  Endo (Dr. Loanne Drilling felt like Biotin was causing the abnormal TFT's).  Repeat TFTs OFF BIOTIN x 2 wks were NORMAL.  NO FURTHER THYROID TESTING NEEDED.  Marland Kitchen Anginal pain ([place])   . Arthritis    "think it's osteo; got some in my hands"  . Bladder infection, chronic 10/03/11   "have had one for over 1 yr"--has been on trimethaprim 100 mg qd since 2014  . CAD (coronary artery disease)    Cath 2009  Occluded LAD, 40-50% RCA and circ managed medically.  Repeat 06/22/15 no change.  . Chronic constipation    slow transit.  Barium enema to eval for colonic stricture 10/2014 was NORMAL  . Cognitive dysfunction    short term memory loss--takes ginko biloba  . GERD (gastroesophageal reflux disease)    (also LPR) Schatzki's ring, s/p dil '97; food impaction 08/2010, on PPI therapy since w/out further sx so no dil performed  . History of herpes zoster 06/2015   right side of chest; presented with chest pain mimicking [place]--cardiac eval showed stable CAD compared to 2009.  Marland Kitchen Hyperlipidemia   . Hypertension   . Hyponatremia 10/03/2011   Na  131 on labs 02/2015 at [facility].  Baseline Na 132.  . Iron deficiency anemia 11/30/15   Hemoccults neg x 3 12/2015.  ?Mild malabsorption?  . Mixed stress and urge urinary incontinence   . RBBB with left anterior fascicular block    bifascicular block  . 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 Thrombocytopenia ([place]) 02/2013   Plts 114K  . Uterine cancer ([place]) 2000  . Xerostomia 2017/2018   Age related glandular atrophy + med effect (oxybutynin and gabapentin)    Past Surgical History:  Procedure Laterality Date  . ABDOMINAL HYSTERECTOMY  2000  . APPENDECTOMY  2000  . CARDIAC CATHETERIZATION N/A 06/22/2015   Stable single vessel CAD, EF normal.  Procedure: Left Heart Cath and Coronary Angiography;  Surgeon: Leonie Man, MD;  Location: [place] CV LAB;  Service: Cardiovascular;  Laterality: N/A;  . CATARACT EXTRACTION W/ INTRAOCULAR LENS  IMPLANT, BILATERAL  1990's  . [place]; 1960  . COLONOSCOPY  04/2001   Neg; pt cancelled screening colon 06/2011  . DILATION AND CURETTAGE OF UTERUS    . HERNIA REPAIR     abdominal "twice"  . LEFT HEART CATHETERIZATION WITH CORONARY ANGIOGRAM  N/A 10/04/2011   Procedure: LEFT HEART CATHETERIZATION WITH CORONARY ANGIOGRAM;  Surgeon: Jacolyn Reedy, MD;  Location: [place] CATH LAB;  Service: Cardiovascular;  Laterality: N/A;  . REPAIR KNEE LIGAMENT  ~ 2008   left  . TONSILLECTOMY     as a child  . TRANSTHORACIC ECHOCARDIOGRAM  05/2013   EF 60-65%, trivial aortic regurg, no wall motion abnorm    Outpatient Medications Prior to Visit  Medication Sig Dispense Refill  . amLODipine (NORVASC) 5 MG tablet TAKE 1 TABLET BY MOUTH DAILY 90 tablet 3  . aspirin 325 MG tablet Take 1 tablet (325 mg total) by mouth daily.    . Cholecalciferol (VITAMIN D3) 1000 UNITS CAPS Take 1,000 Units by mouth daily.     Marland Kitchen co-enzyme Q-10 30 MG capsule  Take 30 mg by mouth daily. Reported on 11/29/2015    . gabapentin (NEURONTIN) 100 MG capsule 1 tab po qAM, 1 tab po q afternoon, and 2 tabs po qhs 120 capsule 2  . Ginkgo Biloba 120 MG CAPS Take 1 capsule by mouth 3 (three) times daily.    Marland Kitchen glucosamine-chondroitin 500-400 MG tablet Take 1 tablet by mouth daily.    Marland Kitchen ibuprofen (ADVIL,MOTRIN) 200 MG tablet Take 400 mg by mouth 2 (two) times daily as needed for moderate pain.     Marland Kitchen losartan (COZAAR) 50 MG tablet Take 50 mg by mouth daily.  3  . nitroGLYCERIN (NITROSTAT) 0.4 MG SL tablet Place 1 tablet (0.4 mg total) under the tongue every 5 (five) minutes as needed for chest pain. 25 tablet 12  . Omega-3 Fatty Acids (FISH OIL) 1000 MG CAPS Take 1 capsule by mouth daily.    Marland Kitchen oxybutynin (DITROPAN) 5 MG tablet Take 2 tablets by mouth daily.  9  . pantoprazole (PROTONIX) 40 MG tablet Take 1 tablet (40 mg total) by mouth daily.    . polyethylene glycol (MIRALAX / GLYCOLAX) packet Take 17 g by mouth 2 (two) times daily. 72 each 11  . simvastatin (ZOCOR) 40 MG tablet TAKE 1 TABLET BY MOUTH ONCE A DAY 90 tablet 1  . trimethoprim (TRIMPEX) 100 MG tablet TAKE 1 TABLET (100 MG TOTAL) BY MOUTH DAILY. 90 tablet 3  . cephALEXin (KEFLEX) 500 MG capsule Take 1 capsule (500 mg total) by mouth 3 (three) times daily. (Patient not taking: Reported on 07/20/2016) 15 capsule 0   Facility-Administered Medications Prior to Visit  Medication Dose Route Frequency Provider Last Rate Last Dose  . acetaminophen (TYLENOL) tablet 650 mg  650 mg Oral Q4H PRN Jacolyn Reedy, MD      . omega-3 acid ethyl esters (LOVAZA) capsule 2 g  2 g Oral BID Jacolyn Reedy, MD      . ondansetron [facility]) 4 mg in sodium chloride 0.9 % 50 mL IVPB  4 mg Intravenous Q6H PRN Jacolyn Reedy, MD        Allergies  Allergen Reactions  . Macrobid [Nitrofurantoin Macrocrystal] Other (See Comments)    "I had chills & fever"    ROS As per HPI  PE: Blood pressure (!) 156/70, pulse (!) 58,  temperature 98.3 F (36.8 C), temperature source Oral, resp. rate 16, height 5\' 4"  (1.626 m), weight 126 lb 13 oz (57.5 kg), SpO2 97 %. Gen: Alert, well appearing.  Patient is oriented to person, place, time, and situation. AFFECT: pleasant, lucid thought and speech. CV: RRR, soft syst murmur, no rub/gallop. LUNGS: CTA bilat, nonlabored. EXT: no clubbing, cyanosis, or edema.  LABS:  Lab Results  Component Value Date   TSH 1.55 11/29/2015   Lab Results  Component Value Date   WBC 8.7 07/11/2016   HGB 12.5 07/11/2016   HCT 36.2 07/11/2016   MCV 87.9 07/11/2016   PLT 146 (L) 07/11/2016   Lab Results  Component Value Date   CREATININE 0.85 07/17/2016   BUN 16 07/17/2016   NA 132 (L) 07/17/2016   K 5.2 07/17/2016   CL 98 07/17/2016   CO2 28 07/17/2016   Lab Results  Component Value Date   ALT 17 07/11/2016   AST 25 07/11/2016   ALKPHOS 54 07/11/2016   BILITOT 0.7 07/11/2016   Lab Results  Component Value Date   CHOL 171 06/21/2015   Lab Results  Component Value Date   HDL 71 06/21/2015   Lab Results  Component Value Date   LDLCALC 95 06/21/2015   Lab Results  Component Value Date   TRIG 26 06/21/2015   Lab Results  Component Value Date   CHOLHDL 2.4 06/21/2015   Lab Results  Component Value Date   HGBA1C 5.8 (H) 05/30/2013   IMPRESSION AND PLAN:  1) Systolic HTN, poor control: can't increase her amlodipine b/c she is on simvastatin 40mg  qd. Can't increase her losartan b/c most recent potassium was 5.2. Will start low dose HCTZ 12.5mg  qd.  Therapeutic expectations and side effect profile of medication discussed today.  Patient's questions answered. She'll continue home bp monitoring and bring in numbers WRITTEN DOWN to review at next f/u in 1 mo. We'll recheck BMET at that time as well.  2) Hyperlipidemia: tolerating statin.  Not fasting for repeat FLP so we won't get this lab today. AST/ALT normal 1 week ago.  3) Preventative healthcare: records  show no sign of her receiving pneumovax after age 22, so I gave pneumovax 23 today.  An After Visit Summary was printed and given to the patient.  Spent 25 min with pt today, with >50% of this time spent in counseling and care coordination regarding the above problems.  FOLLOW UP: Return in about 4 weeks (around 08/17/2016) for f/u HTN.  Signed:  Crissie Sickles, MD           07/20/2016

## 2016-07-20 NOTE — Progress Notes (Signed)
Pre visit review using our clinic review tool, if applicable. No additional management support is needed unless otherwise documented below in the visit note. 

## 2016-07-24 ENCOUNTER — Telehealth: Payer: Self-pay | Admitting: Family Medicine

## 2016-07-24 NOTE — Telephone Encounter (Signed)
Stop amlodipine and losartan. Monitor bp 1-2 times per day and call with report of these in a few days.  Drink plenty of clear fluids.-thx

## 2016-07-24 NOTE — Telephone Encounter (Signed)
SW pt and she stated that she woke up feeling okay this morning, then a little later she started feeling bad so she checked her BP and it was 104/57. She stated that she forgot to tell Dr. Anitra Lauth that her cardiologist had her taking amlodipine BID until her BP came down. She stated that once her BP came down she stopped taking the amlodipine. She wants to know what she should do. Please advise. Thanks.

## 2016-07-24 NOTE — Telephone Encounter (Signed)
Noted agree

## 2016-07-24 NOTE — Telephone Encounter (Signed)
Sabrina Alvarez pt and advised her of Dr. Isla Pence recommendation below. Pt stated that she did not feel comfortable stopping the amlodipine because it was being prescribed by her cardiologist and was one of her "heart meds". I advised pt that the amlodipine is a blood pressure medication and that Dr. Anitra Lauth recommends that she stop taking it and the losartan and keep taking the hctz. Pt stated that she did not know what to do. She stated that her son came by and rechecked her BP and it was 148/69. I advised to follow Dr. Isla Pence recommendation or she could contact her cardiologist and ask for their advice.

## 2016-07-24 NOTE — Telephone Encounter (Signed)
Patient not feeling well this morning due to new bp medication. Patient requesting CB.

## 2016-08-08 DIAGNOSIS — R319 Hematuria, unspecified: Secondary | ICD-10-CM | POA: Diagnosis not present

## 2016-08-08 DIAGNOSIS — N939 Abnormal uterine and vaginal bleeding, unspecified: Secondary | ICD-10-CM | POA: Diagnosis not present

## 2016-08-10 ENCOUNTER — Other Ambulatory Visit: Payer: Self-pay | Admitting: *Deleted

## 2016-08-10 MED ORDER — AMLODIPINE BESYLATE 5 MG PO TABS: 5.0000 mg | ORAL_TABLET | Freq: Every day | ORAL | 1 refills | Status: DC

## 2016-08-10 NOTE — Telephone Encounter (Signed)
[facility].  RF request for amlodipine LOV: 07/20/16 Next ov: None Last written: 09/09/15 #90 w/ 3RF

## 2016-08-11 ENCOUNTER — Other Ambulatory Visit: Payer: Self-pay | Admitting: Family Medicine

## 2016-08-15 ENCOUNTER — Other Ambulatory Visit: Payer: Self-pay | Admitting: Family Medicine

## 2016-08-15 NOTE — Telephone Encounter (Signed)
[place]  RF request for amlodipine LOV: 07/20/16 Next ov: 08/23/16 Last written: 08/10/16 #90 w/ 1RF

## 2016-08-17 ENCOUNTER — Encounter: Payer: Self-pay | Admitting: Family Medicine

## 2016-08-17 ENCOUNTER — Ambulatory Visit (INDEPENDENT_AMBULATORY_CARE_PROVIDER_SITE_OTHER): Payer: Medicare Other | Admitting: Family Medicine

## 2016-08-17 VITALS — BP 161/78 | HR 58 | Temp 97.7°F | Resp 16 | Ht 64.0 in | Wt 121.8 lb

## 2016-08-17 DIAGNOSIS — N390 Urinary tract infection, site not specified: Secondary | ICD-10-CM

## 2016-08-17 DIAGNOSIS — R310 Gross hematuria: Secondary | ICD-10-CM

## 2016-08-17 LAB — POCT URINALYSIS DIPSTICK
Bilirubin, UA: NEGATIVE
GLUCOSE UA: NEGATIVE
KETONES UA: NEGATIVE
Nitrite, UA: NEGATIVE
Protein, UA: NEGATIVE
SPEC GRAV UA: 1.015
UROBILINOGEN UA: 0.2
pH, UA: 7

## 2016-08-17 NOTE — Progress Notes (Signed)
OFFICE VISIT  08/17/2016   CC:  Chief Complaint  Patient presents with  . Urinary Tract Infection   HPI:    Patient is a 83 y.o. Caucasian female who presents for urinary complaint.   She has a history of recurrent UTI and has been on prophylactic trimethaprim 100 mg qd since 2014. She said she has recently seen her OB/GYN for "spotting" of in underwear x 2d, none since.  Her GYN examined her and determined the blood was not coming from vagina ---he told her it was coming from her bladder.  Denies seeing blood in urine, no dysuria, no signif urgency, no persistent increase in frequency of urination. She doesn't feel the same way she does when she thinks she has a bladder infection. She has a urologist--has f/u 12/2016---goes annually to see Dr. Wendy Poet.  Past Medical History:  Diagnosis Date  . Abnormal finding on thyroid function test    per old records: T3 low, free T4 elevated, TSH normal.  Armour thyroid made her feel worse and T4 went up so med d/c'd and liothyronine 5 mcg tried but also made pt feel worse.  Endo (Dr. Loanne Drilling felt like Biotin was causing the abnormal TFT's).  Repeat TFTs OFF BIOTIN x 2 wks were NORMAL.  NO FURTHER THYROID TESTING NEEDED.  Marland Kitchen Anginal pain ([place])   . Arthritis    "think it's osteo; got some in my hands"  . Bladder infection, chronic 10/03/11   "have had one for over 1 yr"--has been on trimethaprim 100 mg qd since 2014  . CAD (coronary artery disease)    Cath 2009  Occluded LAD, 40-50% RCA and circ managed medically.  Repeat 06/22/15 no change.  . Chronic constipation    slow transit.  Barium enema to eval for colonic stricture 10/2014 was NORMAL  . Cognitive dysfunction    short term memory loss--takes ginko biloba  . GERD (gastroesophageal reflux disease)    (also LPR) Schatzki's ring, s/p dil '97; food impaction 08/2010, on PPI therapy since w/out further sx so no dil performed  . History of herpes zoster 06/2015   right side of chest; presented  with chest pain mimicking USA--cardiac eval showed stable CAD compared to 2009.  Marland Kitchen Hyperlipidemia   . Hypertension   . Hyponatremia 10/03/2011   Na 131 on labs 02/2015 at [facility].  Baseline Na 132.  . Iron deficiency anemia 11/30/15   Hemoccults neg x 3 12/2015.  ?Mild malabsorption?  . Mixed stress and urge urinary incontinence   . RBBB with left anterior fascicular block    bifascicular block  . 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 Thrombocytopenia ([place]) 02/2013   Plts 114K  . Uterine cancer ([place]) 2000  . Xerostomia 2017/2018   Age related glandular atrophy + med effect (oxybutynin and gabapentin)    Past Surgical History:  Procedure Laterality Date  . ABDOMINAL HYSTERECTOMY  2000  . APPENDECTOMY  2000  . CARDIAC CATHETERIZATION N/A 06/22/2015   Stable single vessel CAD, EF normal.  Procedure: Left Heart Cath and Coronary Angiography;  Surgeon: Leonie Man, MD;  Location: [place] [facility];  Service: Cardiovascular;  Laterality: N/A;  . CATARACT EXTRACTION W/ INTRAOCULAR LENS  IMPLANT, BILATERAL  1990's  . [place]; 1960  . COLONOSCOPY  04/2001   Neg; pt cancelled screening colon 06/2011  .  DILATION AND CURETTAGE OF UTERUS    . HERNIA REPAIR     abdominal "twice"  . LEFT HEART CATHETERIZATION WITH CORONARY ANGIOGRAM N/A 10/04/2011   Procedure: LEFT HEART CATHETERIZATION WITH CORONARY ANGIOGRAM;  Surgeon: Jacolyn Reedy, MD;  Location: [facility] LAB;  Service: Cardiovascular;  Laterality: N/A;  . REPAIR KNEE LIGAMENT  ~ 2008   left  . TONSILLECTOMY     as a child  . TRANSTHORACIC ECHOCARDIOGRAM  05/2013   EF 60-65%, trivial aortic regurg, no wall motion abnorm    Outpatient Medications Prior to Visit  Medication Sig Dispense Refill  . amLODipine (NORVASC) 5 MG tablet TAKE 1 TABLET BY MOUTH DAILY 90 tablet 1  . aspirin 325 MG tablet Take 1 tablet  (325 mg total) by mouth daily.    . Cholecalciferol (VITAMIN D3) 1000 UNITS CAPS Take 1,000 Units by mouth daily.     Marland Kitchen co-enzyme Q-10 30 MG capsule Take 30 mg by mouth daily. Reported on 11/29/2015    . gabapentin (NEURONTIN) 100 MG capsule 1 tab po qAM, 1 tab po q afternoon, and 2 tabs po qhs 120 capsule 2  . Ginkgo Biloba 120 MG CAPS Take 1 capsule by mouth 3 (three) times daily.    Marland Kitchen glucosamine-chondroitin 500-400 MG tablet Take 1 tablet by mouth daily.    . hydrochlorothiazide (MICROZIDE) 12.5 MG capsule Take 1 capsule (12.5 mg total) by mouth daily. 30 capsule 1  . losartan (COZAAR) 50 MG tablet TAKE 1/2 TABLET BY MOUTH TWICE A DAY. 90 tablet 1  . nitroGLYCERIN (NITROSTAT) 0.4 MG SL tablet Place 1 tablet (0.4 mg total) under the tongue every 5 (five) minutes as needed for chest pain. 25 tablet 12  . Omega-3 Fatty Acids (FISH OIL) 1000 MG CAPS Take 1 capsule by mouth daily.    Marland Kitchen oxybutynin (DITROPAN) 5 MG tablet Take 2 tablets by mouth daily.  9  . pantoprazole (PROTONIX) 40 MG tablet Take 1 tablet (40 mg total) by mouth daily.    . polyethylene glycol (MIRALAX / GLYCOLAX) packet Take 17 g by mouth 2 (two) times daily. 72 each 11  . simvastatin (ZOCOR) 40 MG tablet TAKE 1 TABLET BY MOUTH ONCE A DAY 90 tablet 1  . trimethoprim (TRIMPEX) 100 MG tablet TAKE 1 TABLET (100 MG TOTAL) BY MOUTH DAILY. 90 tablet 3  . amLODipine (NORVASC) 5 MG tablet Take 1 tablet (5 mg total) by mouth daily. (Patient not taking: Reported on 08/17/2016) 90 tablet 1  . ibuprofen (ADVIL,MOTRIN) 200 MG tablet Take 400 mg by mouth 2 (two) times daily as needed for moderate pain.      Facility-Administered Medications Prior to Visit  Medication Dose Route Frequency Provider Last Rate Last Dose  . acetaminophen (TYLENOL) tablet 650 mg  650 mg Oral Q4H PRN Jacolyn Reedy, MD      . omega-3 acid ethyl esters (LOVAZA) capsule 2 g  2 g Oral BID Jacolyn Reedy, MD      . ondansetron [facility]) 4 mg in sodium chloride 0.9 % 50  mL IVPB  4 mg Intravenous Q6H PRN Jacolyn Reedy, MD        Allergies  Allergen Reactions  . Macrobid [Nitrofurantoin Macrocrystal] Other (See Comments)    "I had chills & fever"    ROS As per HPI  PE: Blood pressure (!) 161/78, pulse (!) 58, temperature 97.7 F (36.5 C), temperature source Oral, resp. rate 16, height 5\' 4"  (1.626 m), weight 121 lb  12 oz (55.2 kg), SpO2 100 %.  Exam chaperoned by Starla Link, CMA. Gen: Alert, well appearing.  Patient is oriented to person, place, time, and situation. ABD: soft, NT, ND, BS normal.  No hepatospenomegaly or mass.  No bruits. Palpation over her bladder caused her to have urge to urinate but was not tender.   LABS:  CC UA today: trace intact blood, trace LEU, otherwise normal.    Chemistry      Component Value Date/Time   NA 132 (L) 07/17/2016 1645   K 5.2 07/17/2016 1645   CL 98 07/17/2016 1645   CO2 28 07/17/2016 1645   BUN 16 07/17/2016 1645   CREATININE 0.85 07/17/2016 1645      Component Value Date/Time   CALCIUM 8.9 07/17/2016 1645   ALKPHOS 54 07/11/2016 1900   AST 25 07/11/2016 1900   ALT 17 07/11/2016 1900   BILITOT 0.7 07/11/2016 1900     IMPRESSION AND PLAN:  Microhematuria on UA dipstick, with recent gross hematuria in underwear x 2d. Sent urine for c/s.  Not enough specimen to send for UA with micro today.  No abx rx'd at this time. If no bacteria grows in urine, we'll arrange for her to see her urologist BEFORE 12/2016 to get further evaluated for her hematuria. Will get records from recent GYN visit at Dr. Marijean Heath office.  An After Visit Summary was printed and given to the patient.  FOLLOW UP: Return if symptoms worsen or fail to improve.  Signed:  Crissie Sickles, MD           08/17/2016

## 2016-08-17 NOTE — Progress Notes (Signed)
Pre visit review using our clinic review tool, if applicable. No additional management support is needed unless otherwise documented below in the visit note. 

## 2016-08-19 LAB — URINE CULTURE: ORGANISM ID, BACTERIA: NO GROWTH

## 2016-08-21 ENCOUNTER — Telehealth: Payer: Self-pay | Admitting: Family Medicine

## 2016-08-21 NOTE — Telephone Encounter (Signed)
Patient calling back regarding missed call about lab results.  Thank you,  -LL

## 2016-08-21 NOTE — Telephone Encounter (Signed)
Tried calling pt, NA, left message for pt to call back.

## 2016-08-21 NOTE — Telephone Encounter (Signed)
See results notes. 

## 2016-08-22 ENCOUNTER — Telehealth: Payer: Self-pay | Admitting: Family Medicine

## 2016-08-22 DIAGNOSIS — H02052 Trichiasis without entropian right lower eyelid: Secondary | ICD-10-CM | POA: Diagnosis not present

## 2016-08-22 DIAGNOSIS — H11051 Peripheral pterygium, progressive, right eye: Secondary | ICD-10-CM | POA: Diagnosis not present

## 2016-08-22 DIAGNOSIS — Z961 Presence of intraocular lens: Secondary | ICD-10-CM | POA: Diagnosis not present

## 2016-08-22 NOTE — Telephone Encounter (Signed)
Patient returning call RE labwork.  Thank you,  -LL

## 2016-08-22 NOTE — Telephone Encounter (Signed)
Spoke with patient and she was notified of results. Verbalized understanding.

## 2016-08-23 ENCOUNTER — Encounter: Payer: Self-pay | Admitting: Family Medicine

## 2016-08-23 ENCOUNTER — Ambulatory Visit (INDEPENDENT_AMBULATORY_CARE_PROVIDER_SITE_OTHER): Payer: Medicare Other | Admitting: Family Medicine

## 2016-08-23 VITALS — BP 136/64 | HR 54 | Temp 98.0°F | Resp 16 | Ht 64.0 in | Wt 120.2 lb

## 2016-08-23 DIAGNOSIS — I10 Essential (primary) hypertension: Secondary | ICD-10-CM

## 2016-08-23 DIAGNOSIS — R310 Gross hematuria: Secondary | ICD-10-CM

## 2016-08-23 NOTE — Progress Notes (Signed)
Pre visit review using our clinic review tool, if applicable. No additional management support is needed unless otherwise documented below in the visit note. 

## 2016-08-23 NOTE — Progress Notes (Signed)
OFFICE VISIT  08/23/2016   CC:  Chief Complaint  Sabrina Alvarez presents with  . Follow-up    RCI, pt is not fasting.    HPI:    Sabrina Alvarez is a 83 y.o. Caucasian female who presents for 1 mo f/u HTN. Last visit I added hctz 12.5mg  qd and asked her to monitor bp and write numbers down for review today. She did not start the hctz I rx'd b/c her heart MD told her not to b/c she was  She has monitored bp a couple of times at home and they have been normal--like today's.  This morning she didn't feel could; can't be specific. After eating BF she felt much better--back to baseline. No palpitations, heart racing, dizziness, SOB, or CP.    Past Medical History:  Diagnosis Date  . Abnormal finding on thyroid function test    per old records: T3 low, free T4 elevated, TSH normal.  [place]our thyroid made her feel worse and T4 went up so med d/c'd and liothyronine 5 mcg tried but also made pt feel worse.  Endo (Dr. Loanne Drilling felt like Biotin was causing the abnormal TFT's).  Repeat TFTs OFF BIOTIN x 2 wks were NORMAL.  NO FURTHER THYROID TESTING NEEDED.  Marland Kitchen Anginal pain ([place])   . Arthritis    "think it's osteo; got some in my hands"  . Bladder infection, chronic 10/03/11   "have had one for over 1 yr"--has been on trimethaprim 100 mg qd since 2014  . CAD (coronary artery disease)    Cath 2009  Occluded LAD, 40-50% RCA and circ managed medically.  Repeat 06/22/15 no change.  . Chronic constipation    slow transit.  Barium enema to eval for colonic stricture 10/2014 was NORMAL  . Cognitive dysfunction    short term memory loss--takes ginko biloba  . GERD (gastroesophageal reflux disease)    (also LPR) Schatzki's ring, s/p dil '97; food impaction 08/2010, on PPI therapy since w/out further sx so no dil performed  . History of herpes zoster 06/2015   right side of chest; presented with chest pain mimicking USA--cardiac eval showed stable CAD compared to 2009.  Marland Kitchen Hyperlipidemia   . Hypertension   . Hyponatremia  10/03/2011   Na 131 on labs 02/2015 at [facility].  Baseline Na 132.  . Iron deficiency anemia 11/30/15   Hemoccults neg x 3 12/2015.  ?Mild malabsorption?  . Mixed stress and urge urinary incontinence   . RBBB with left anterior fascicular block    bifascicular block  . 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 Thrombocytopenia ([place]) 02/2013   Plts 114K  . Uterine cancer ([place]) 2000  . Xerostomia 2017/2018   Age related glandular atrophy + med effect (oxybutynin and gabapentin)    Past Surgical History:  Procedure Laterality Date  . ABDOMINAL HYSTERECTOMY  2000  . APPENDECTOMY  2000  . CARDIAC CATHETERIZATION N/A 06/22/2015   Stable single vessel CAD, EF normal.  Procedure: Left Heart Cath and Coronary Angiography;  Surgeon: Leonie Man, MD;  Location: [place] [facility];  Service: Cardiovascular;  Laterality: N/A;  . CATARACT EXTRACTION W/ INTRAOCULAR LENS  IMPLANT, BILATERAL  1990's  . [place]; 1960  . COLONOSCOPY  04/2001   Neg; pt cancelled screening colon 06/2011  . DILATION AND CURETTAGE OF UTERUS    . HERNIA REPAIR  abdominal "twice"  . LEFT HEART CATHETERIZATION WITH CORONARY ANGIOGRAM N/A 10/04/2011   Procedure: LEFT HEART CATHETERIZATION WITH CORONARY ANGIOGRAM;  Surgeon: Jacolyn Reedy, MD;  Location: [facility] CATH LAB;  Service: Cardiovascular;  Laterality: N/A;  . REPAIR KNEE LIGAMENT  ~ 2008   left  . TONSILLECTOMY     as a child  . TRANSTHORACIC ECHOCARDIOGRAM  05/2013   EF 60-65%, trivial aortic regurg, no wall motion abnorm    Outpatient Medications Prior to Visit  Medication Sig Dispense Refill  . acetaminophen (TYLENOL) 325 MG tablet Take 325 mg by mouth every 6 (six) hours as needed.    Marland Kitchen amLODipine (NORVASC) 5 MG tablet TAKE 1 TABLET BY MOUTH DAILY 90 tablet 1  . aspirin 325 MG tablet Take 1 tablet (325 mg total) by mouth daily.    .  Cholecalciferol (VITAMIN D3) 1000 UNITS CAPS Take 1,000 Units by mouth daily.     Marland Kitchen co-enzyme Q-10 30 MG capsule Take 30 mg by mouth daily. Reported on 11/29/2015    . gabapentin (NEURONTIN) 100 MG capsule 1 tab po qAM, 1 tab po q afternoon, and 2 tabs po qhs 120 capsule 2  . Ginkgo Biloba 120 MG CAPS Take 1 capsule by mouth 3 (three) times daily.    Marland Kitchen glucosamine-chondroitin 500-400 MG tablet Take 1 tablet by mouth daily.    Marland Kitchen losartan (COZAAR) 50 MG tablet TAKE 1/2 TABLET BY MOUTH TWICE A DAY. 90 tablet 1  . nitroGLYCERIN (NITROSTAT) 0.4 MG SL tablet Place 1 tablet (0.4 mg total) under the tongue every 5 (five) minutes as needed for chest pain. 25 tablet 12  . Omega-3 Fatty Acids (FISH OIL) 1000 MG CAPS Take 1 capsule by mouth daily.    Marland Kitchen oxybutynin (DITROPAN) 5 MG tablet Take 2 tablets by mouth daily.  9  . pantoprazole (PROTONIX) 40 MG tablet Take 1 tablet (40 mg total) by mouth daily.    . polyethylene glycol (MIRALAX / GLYCOLAX) packet Take 17 g by mouth 2 (two) times daily. 72 each 11  . simvastatin (ZOCOR) 40 MG tablet TAKE 1 TABLET BY MOUTH ONCE A DAY 90 tablet 1  . trimethoprim (TRIMPEX) 100 MG tablet TAKE 1 TABLET (100 MG TOTAL) BY MOUTH DAILY. 90 tablet 3  . hydrochlorothiazide (MICROZIDE) 12.5 MG capsule Take 1 capsule (12.5 mg total) by mouth daily. 30 capsule 1   Facility-Administered Medications Prior to Visit  Medication Dose Route Frequency Provider Last Rate Last Dose  . acetaminophen (TYLENOL) tablet 650 mg  650 mg Oral Q4H PRN Jacolyn Reedy, MD      . omega-3 acid ethyl esters (LOVAZA) capsule 2 g  2 g Oral BID Jacolyn Reedy, MD      . ondansetron [facility]) 4 mg in sodium chloride 0.9 % 50 mL IVPB  4 mg Intravenous Q6H PRN Jacolyn Reedy, MD        Allergies  Allergen Reactions  . Macrobid [Nitrofurantoin Macrocrystal] Other (See Comments)    "I had chills & fever"    ROS As per HPI  PE: Blood pressure 136/64, pulse (!) 54, temperature 98 F (36.7 C),  temperature source Oral, resp. rate 16, height 5\' 4"  (1.626 m), weight 120 lb 4 oz (54.5 kg), SpO2 100 %. Gen: Alert, well appearing.  Sabrina Alvarez is oriented to person, place, time, and situation. AFFECT: pleasant, lucid thought and speech. KKX:FGHW: no injection, icteris, swelling, or exudate.  EOMI, PERRLA. Mouth: lips without lesion/swelling.  Oral mucosa pink  and moist. Oropharynx without erythema, exudate, or swelling.  CV: RRR with occasional premature beat with compensatory delay.  She has 2/6 systolic murmur.  No diastolic murmur.  No r/g.   LUNGS: CTA bilat, nonlabored resps, good aeration in all lung fields. EXT: no clubbing or cyanosis.  1+ pitting edema bilat.   LABS:    Chemistry      Component Value Date/Time   NA 132 (L) 07/17/2016 1645   K 5.2 07/17/2016 1645   CL 98 07/17/2016 1645   CO2 28 07/17/2016 1645   BUN 16 07/17/2016 1645   CREATININE 0.85 07/17/2016 1645      Component Value Date/Time   CALCIUM 8.9 07/17/2016 1645   ALKPHOS 54 07/11/2016 1900   AST 25 07/11/2016 1900   ALT 17 07/11/2016 1900   BILITOT 0.7 07/11/2016 1900     IMPRESSION AND PLAN:  1) HTN; good control since last o/v. She has financial constraints that prevented her from buying the hctz I rx'd last visit. She was going to have to STOP one of her other bp meds in order to buy the hctz. Her son called Dr. Thurman Coyer office and told them the situation and pt reports she was told that if she stops Dr. Thurman Coyer HTN meds and takes the hctz, her bp would likely go up---and I agree.   At any rate, she is doing fine on osartan 1/2 of 50mg  tab bid and norvasc 5mg  qd.  2) Hx of gross hematuria reported by pt last visit, UA with + blood last visit but no growth on urine clx and she was asymptomatic. Will collect urine today to send for UA with reflex microscopy. She has appt in July this year with her urologist.  An After Visit Summary was printed and given to the Sabrina Alvarez.  FOLLOW UP: Return in  about 3 months (around 11/23/2016) for routine chronic illness f/u.  Signed:  Crissie Sickles, MD           08/23/2016

## 2016-08-24 LAB — URINALYSIS, ROUTINE W REFLEX MICROSCOPIC
Bilirubin Urine: NEGATIVE
HGB URINE DIPSTICK: NEGATIVE
KETONES UR: NEGATIVE
Nitrite: NEGATIVE
Specific Gravity, Urine: 1.02 (ref 1.000–1.030)
Total Protein, Urine: NEGATIVE
URINE GLUCOSE: NEGATIVE
UROBILINOGEN UA: 0.2 (ref 0.0–1.0)
pH: 6 (ref 5.0–8.0)

## 2016-09-04 ENCOUNTER — Telehealth: Payer: Self-pay | Admitting: Family Medicine

## 2016-09-04 NOTE — Telephone Encounter (Signed)
Patient has burning with urination. She would like something called in or a call back to see if she can drop off a specimen. I advised her that there are no appt for today.  Thank you,  -LL

## 2016-09-04 NOTE — Telephone Encounter (Signed)
She can make lab appt to give a specimen in the office (no drop-off specimen). Pls order dipstick UA and urine culture, dx dysuria.--thx

## 2016-09-04 NOTE — Telephone Encounter (Signed)
Please advise. Thanks.  

## 2016-09-05 ENCOUNTER — Other Ambulatory Visit (INDEPENDENT_AMBULATORY_CARE_PROVIDER_SITE_OTHER): Payer: Medicare Other

## 2016-09-05 DIAGNOSIS — R300 Dysuria: Secondary | ICD-10-CM | POA: Diagnosis not present

## 2016-09-05 LAB — POC URINALSYSI DIPSTICK (AUTOMATED)
BILIRUBIN UA: NEGATIVE
GLUCOSE UA: NEGATIVE
KETONES UA: NEGATIVE
Nitrite, UA: NEGATIVE
Protein, UA: NEGATIVE
SPEC GRAV UA: 1.005 (ref 1.030–1.035)
Urobilinogen, UA: 0.2 (ref ?–2.0)
pH, UA: 5.5 (ref 5.0–8.0)

## 2016-09-05 NOTE — Telephone Encounter (Signed)
Pt advised and voiced understanding.  She is coming in today at 10:00am to give urine sample.

## 2016-09-07 LAB — URINE CULTURE: ORGANISM ID, BACTERIA: NO GROWTH

## 2016-09-08 DIAGNOSIS — R300 Dysuria: Secondary | ICD-10-CM | POA: Diagnosis not present

## 2016-09-08 DIAGNOSIS — N3946 Mixed incontinence: Secondary | ICD-10-CM | POA: Diagnosis not present

## 2016-09-20 ENCOUNTER — Ambulatory Visit (INDEPENDENT_AMBULATORY_CARE_PROVIDER_SITE_OTHER): Payer: Medicare Other | Admitting: Family Medicine

## 2016-09-20 ENCOUNTER — Telehealth: Payer: Self-pay | Admitting: Family Medicine

## 2016-09-20 ENCOUNTER — Encounter: Payer: Self-pay | Admitting: Family Medicine

## 2016-09-20 VITALS — BP 147/66 | HR 60 | Temp 97.9°F | Resp 16 | Wt 123.0 lb

## 2016-09-20 DIAGNOSIS — J301 Allergic rhinitis due to pollen: Secondary | ICD-10-CM | POA: Diagnosis not present

## 2016-09-20 MED ORDER — FLUTICASONE PROPIONATE 50 MCG/ACT NA SUSP: 2.0000 | Freq: Every day | NASAL | 6 refills | Status: DC

## 2016-09-20 NOTE — Progress Notes (Signed)
OFFICE VISIT  09/20/2016   CC:  Chief Complaint  Patient presents with  . URI    Head and chest congestion   HPI:    Patient is a 83 y.o. Caucasian female who presents for "congestion". Nose congested, ST, some cough and sneezing.  Symptoms began about 3-4 days ago.   No chest tightness, no SOB, no wheezing.  No fever. Trying robitussin DM.  Occ use of saline nasal spray.  No malaise.  Past Medical History:  Diagnosis Date  . Abnormal finding on thyroid function test    per old records: T3 low, free T4 elevated, TSH normal.  Armour thyroid made her feel worse and T4 went up so med d/c'd and liothyronine 5 mcg tried but also made pt feel worse.  Endo (Dr. Loanne Drilling felt like Biotin was causing the abnormal TFT's).  Repeat TFTs OFF BIOTIN x 2 wks were NORMAL.  NO FURTHER THYROID TESTING NEEDED.  Marland Kitchen Anginal pain ([place])   . Arthritis    "think it's osteo; got some in my hands"  . Bladder infection, chronic 10/03/11   "have had one for over 1 yr"--has been on trimethaprim 100 mg qd since 2014  . CAD (coronary artery disease)    Cath 2009  Occluded LAD, 40-50% RCA and circ managed medically.  Repeat 06/22/15 no change.  . Chronic constipation    slow transit.  Barium enema to eval for colonic stricture 10/2014 was NORMAL  . Cognitive dysfunction    short term memory loss--takes ginko biloba  . GERD (gastroesophageal reflux disease)    (also LPR) Schatzki's ring, s/p dil '97; food impaction 08/2010, on PPI therapy since w/out further sx so no dil performed  . History of herpes zoster 06/2015   right side of chest; presented with chest pain mimicking USA--cardiac eval showed stable CAD compared to 2009.  Marland Kitchen Hyperlipidemia   . Hypertension   . Hyponatremia 10/03/2011   Na 131 on labs 02/2015 at [facility].  Baseline Na 132.  . Iron deficiency anemia 11/30/15   Hemoccults neg x 3 12/2015.  ?Mild malabsorption?  . Mixed stress and urge urinary incontinence   . RBBB with left anterior fascicular  block    bifascicular block  . 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 Thrombocytopenia ([place]) 02/2013   Plts 114K  . Uterine cancer ([place]) 2000  . Xerostomia 2017/2018   Age related glandular atrophy + med effect (oxybutynin and gabapentin)    Past Surgical History:  Procedure Laterality Date  . ABDOMINAL HYSTERECTOMY  2000  . APPENDECTOMY  2000  . CARDIAC CATHETERIZATION N/A 06/22/2015   Stable single vessel CAD, EF normal.  Procedure: Left Heart Cath and Coronary Angiography;  Surgeon: Leonie Man, MD;  Location: [place] CV LAB;  Service: Cardiovascular;  Laterality: N/A;  . CATARACT EXTRACTION W/ INTRAOCULAR LENS  IMPLANT, BILATERAL  1990's  . [place]; 1960  . COLONOSCOPY  04/2001   Neg; pt cancelled screening colon 06/2011  . DILATION AND CURETTAGE OF UTERUS    . HERNIA REPAIR     abdominal "twice"  . LEFT HEART CATHETERIZATION WITH CORONARY ANGIOGRAM N/A 10/04/2011   Procedure: LEFT HEART CATHETERIZATION WITH CORONARY ANGIOGRAM;  Surgeon: Jacolyn Reedy, MD;  Location: [facility] CATH LAB;  Service: Cardiovascular;  Laterality: N/A;  . REPAIR KNEE LIGAMENT  ~ 2008  left  . TONSILLECTOMY     as a child  . TRANSTHORACIC ECHOCARDIOGRAM  05/2013   EF 60-65%, trivial aortic regurg, no wall motion abnorm    Outpatient Medications Prior to Visit  Medication Sig Dispense Refill  . acetaminophen (TYLENOL) 325 MG tablet Take 325 mg by mouth every 6 (six) hours as needed.    Marland Kitchen amLODipine (NORVASC) 5 MG tablet TAKE 1 TABLET BY MOUTH DAILY 90 tablet 1  . aspirin 325 MG tablet Take 1 tablet (325 mg total) by mouth daily.    . Cholecalciferol (VITAMIN D3) 1000 UNITS CAPS Take 1,000 Units by mouth daily.     Marland Kitchen co-enzyme Q-10 30 MG capsule Take 30 mg by mouth daily. Reported on 11/29/2015    . gabapentin (NEURONTIN) 100 MG capsule 1 tab po qAM, 1 tab po q afternoon,  and 2 tabs po qhs 120 capsule 2  . Ginkgo Biloba 120 MG CAPS Take 1 capsule by mouth 3 (three) times daily.    Marland Kitchen glucosamine-chondroitin 500-400 MG tablet Take 1 tablet by mouth daily.    Marland Kitchen losartan (COZAAR) 50 MG tablet TAKE 1/2 TABLET BY MOUTH TWICE A DAY. 90 tablet 1  . nitroGLYCERIN (NITROSTAT) 0.4 MG SL tablet Place 1 tablet (0.4 mg total) under the tongue every 5 (five) minutes as needed for chest pain. 25 tablet 12  . Omega-3 Fatty Acids (FISH OIL) 1000 MG CAPS Take 1 capsule by mouth daily.    Marland Kitchen oxybutynin (DITROPAN) 5 MG tablet Take 2 tablets by mouth daily.  9  . pantoprazole (PROTONIX) 40 MG tablet Take 1 tablet (40 mg total) by mouth daily.    . polyethylene glycol (MIRALAX / GLYCOLAX) packet Take 17 g by mouth 2 (two) times daily. 72 each 11  . simvastatin (ZOCOR) 40 MG tablet TAKE 1 TABLET BY MOUTH ONCE A DAY 90 tablet 1  . trimethoprim (TRIMPEX) 100 MG tablet TAKE 1 TABLET (100 MG TOTAL) BY MOUTH DAILY. 90 tablet 3   Facility-Administered Medications Prior to Visit  Medication Dose Route Frequency Provider Last Rate Last Dose  . acetaminophen (TYLENOL) tablet 650 mg  650 mg Oral Q4H PRN Jacolyn Reedy, MD      . omega-3 acid ethyl esters (LOVAZA) capsule 2 g  2 g Oral BID Jacolyn Reedy, MD      . ondansetron [facility]) 4 mg in sodium chloride 0.9 % 50 mL IVPB  4 mg Intravenous Q6H PRN Jacolyn Reedy, MD        Allergies  Allergen Reactions  . Macrobid [Nitrofurantoin Macrocrystal] Other (See Comments)    "I had chills & fever"    ROS As per HPI  PE: Blood pressure (!) 147/66, pulse 60, temperature 97.9 F (36.6 C), temperature source Oral, resp. rate 16, weight 123 lb (55.8 kg), SpO2 99 %. VS: noted--normal. Gen: alert, NAD, NONTOXIC APPEARING. HEENT: eyes without injection, drainage, or swelling.  Ears: EACs clear, TMs with normal light reflex and landmarks.  Nose: Scant clear rhinorrhea, with some dried, crusty exudate adherent to mildly injected mucosa, a  couple of areas of superficially denuded nasal mucosa.  No purulent d/c.  No paranasal sinus TTP.  No facial swelling.  Throat and mouth without focal lesion.  No pharyngial swelling, erythema, or exudate.   Neck: supple, no LAD.   LUNGS: CTA bilat, nonlabored resps.   CV: RRR, no m/r/g. EXT: no c/c/e SKIN: no rash  LABS:    Chemistry  Component Value Date/Time   NA 132 (L) 07/17/2016 1645   K 5.2 07/17/2016 1645   CL 98 07/17/2016 1645   CO2 28 07/17/2016 1645   BUN 16 07/17/2016 1645   CREATININE 0.85 07/17/2016 1645      Component Value Date/Time   CALCIUM 8.9 07/17/2016 1645   ALKPHOS 54 07/11/2016 1900   AST 25 07/11/2016 1900   ALT 17 07/11/2016 1900   BILITOT 0.7 07/11/2016 1900       IMPRESSION AND PLAN:  Seasonal allergic rhinitis vs viral URI.  Suspect allergic rhinitis more. Start flonase 2 sprays each nostril qd, continue otc antihistamine (diphenhydramine does not cause sedation in her), and continue otc saline nasal spray frequently.  An After Visit Summary was printed and given to the patient.  FOLLOW UP: Return if symptoms worsen or fail to improve.  Signed:  Crissie Sickles, MD           09/20/2016

## 2016-09-20 NOTE — Telephone Encounter (Signed)
Patient is inquiring about her "sugars". Patient is eating a lot of cakes, pies, candy, and boost drinks to gain weight. Her sister is diabetic & she is concerned about eating so many sweets.

## 2016-09-20 NOTE — Telephone Encounter (Signed)
Left message for pt to call back  °

## 2016-09-20 NOTE — Telephone Encounter (Signed)
SW Dr. Anitra Lauth and he stated that pts glucose readings have not been concerning for diabetes. Please advise pt to make dietary changes, cut back on the sweets.

## 2016-09-20 NOTE — Telephone Encounter (Signed)
Pt advised and voiced understanding.   

## 2016-10-04 DIAGNOSIS — H04123 Dry eye syndrome of bilateral lacrimal glands: Secondary | ICD-10-CM | POA: Diagnosis not present

## 2016-10-04 DIAGNOSIS — H04129 Dry eye syndrome of unspecified lacrimal gland: Secondary | ICD-10-CM | POA: Diagnosis not present

## 2016-10-04 DIAGNOSIS — H26493 Other secondary cataract, bilateral: Secondary | ICD-10-CM | POA: Diagnosis not present

## 2016-10-27 ENCOUNTER — Other Ambulatory Visit: Payer: Self-pay | Admitting: *Deleted

## 2016-10-27 MED ORDER — SIMVASTATIN 40 MG PO TABS: 40.0000 mg | ORAL_TABLET | Freq: Every day | ORAL | 1 refills | Status: DC

## 2016-10-27 NOTE — Telephone Encounter (Signed)
Pt called stating that she is at [facility] and they told her to contact her PCP for refill. I advised pt that I will send over refill for her simvastatin.   RF request for simvastatin LOV: 08/23/16 Next ov: 11/23/16 Last written: 04/18/16 #90 w/ 1RF

## 2016-11-23 ENCOUNTER — Encounter: Payer: Self-pay | Admitting: Family Medicine

## 2016-11-23 ENCOUNTER — Ambulatory Visit (INDEPENDENT_AMBULATORY_CARE_PROVIDER_SITE_OTHER): Payer: Medicare Other | Admitting: Family Medicine

## 2016-11-23 VITALS — BP 104/46 | HR 57 | Temp 98.5°F | Resp 16 | Ht 64.0 in | Wt 120.5 lb

## 2016-11-23 DIAGNOSIS — R682 Dry mouth, unspecified: Secondary | ICD-10-CM

## 2016-11-23 DIAGNOSIS — E871 Hypo-osmolality and hyponatremia: Secondary | ICD-10-CM | POA: Diagnosis not present

## 2016-11-23 DIAGNOSIS — I872 Venous insufficiency (chronic) (peripheral): Secondary | ICD-10-CM

## 2016-11-23 DIAGNOSIS — R600 Localized edema: Secondary | ICD-10-CM

## 2016-11-23 DIAGNOSIS — K117 Disturbances of salivary secretion: Secondary | ICD-10-CM

## 2016-11-23 DIAGNOSIS — Z0000 Encounter for general adult medical examination without abnormal findings: Secondary | ICD-10-CM

## 2016-11-23 DIAGNOSIS — E7800 Pure hypercholesterolemia, unspecified: Secondary | ICD-10-CM

## 2016-11-23 DIAGNOSIS — I10 Essential (primary) hypertension: Secondary | ICD-10-CM

## 2016-11-23 MED ORDER — ZOSTER VAC RECOMB ADJUVANTED 50 MCG/0.5ML IM SUSR: INTRAMUSCULAR | 1 refills | Status: DC

## 2016-11-23 MED ORDER — PILOCARPINE HCL 5 MG PO TABS
ORAL_TABLET | ORAL | 4 refills | Status: DC
Start: 2016-11-23 — End: 2017-06-06

## 2016-11-23 NOTE — Progress Notes (Signed)
OFFICE VISIT  11/23/2016   CC:  Chief Complaint  Patient presents with  . Follow-up    RCI   HPI:    Patient is a 83 y.o. Caucasian female who presents for 3 mo f/u HTN, hyperlipidemia, hx of mild hyponatremia.   HTN: occ home bp checks normal. She wants to be screened for DM today.  She is not fasting.  HLD: taking simvastatin daily, no side effects.  Dry mouth is major complaint: biotene x 1 mo not helping. She is open to trying a systemic med to treat dry mouth.  Complains of chronic LE edema, not so bad in the morning, gets worse as the day goes on. No LE pain, no claudication symptoms.  No asymmetry.    Past Medical History:  Diagnosis Date  . Abnormal finding on thyroid function test    per old records: T3 low, free T4 elevated, TSH normal.  [place] thyroid made her feel worse and T4 went up so med d/c'd and liothyronine 5 mcg tried but also made pt feel worse.  Endo (Dr. Loanne Drilling felt like Biotin was causing the abnormal TFT's).  Repeat TFTs OFF BIOTIN x 2 wks were NORMAL.  NO FURTHER THYROID TESTING NEEDED.  Marland Kitchen Anginal pain ([place])   . Arthritis    "think it's osteo; got some in my hands"  . Bladder infection, chronic 10/03/11   "have had one for over 1 yr"--has been on trimethaprim 100 mg qd since 2014  . CAD (coronary artery disease)    Cath 2009  Occluded LAD, 40-50% RCA and circ managed medically.  Repeat 06/22/15 no change.  . Chronic constipation    slow transit.  Barium enema to eval for colonic stricture 10/2014 was NORMAL  . Cognitive dysfunction    short term memory loss--takes ginko biloba  . GERD (gastroesophageal reflux disease)    (also LPR) Schatzki's ring, s/p dil '97; food impaction 08/2010, on PPI therapy since w/out further sx so no dil performed  . History of herpes zoster 06/2015   right side of chest; presented with chest pain mimicking [place]--cardiac eval showed stable CAD compared to 2009.  Marland Kitchen Hyperlipidemia   . Hypertension   . Hyponatremia  10/03/2011   Na 131 on labs 02/2015 at [facility].  Baseline Na 132.  . Iron deficiency anemia 11/30/15   Hemoccults neg x 3 12/2015.  ?Mild malabsorption?  . Mixed stress and urge urinary incontinence   . RBBB with left anterior fascicular block    bifascicular block  . 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 Thrombocytopenia ([place]) 02/2013   Plts 114K  . Uterine cancer ([place]) 2000  . Xerostomia 2017/2018   Age related glandular atrophy + med effect (oxybutynin and gabapentin)    Past Surgical History:  Procedure Laterality Date  . ABDOMINAL HYSTERECTOMY  2000  . APPENDECTOMY  2000  . CARDIAC CATHETERIZATION N/A 06/22/2015   Stable single vessel CAD, EF normal.  Procedure: Left Heart Cath and Coronary Angiography;  Surgeon: Leonie Man, MD;  Location: [place] CV LAB;  Service: Cardiovascular;  Laterality: N/A;  . CATARACT EXTRACTION W/ INTRAOCULAR LENS  IMPLANT, BILATERAL  1990's  . [place]; 1960  . COLONOSCOPY  04/2001   Neg; pt cancelled screening colon 06/2011  . DILATION AND CURETTAGE OF UTERUS    . HERNIA REPAIR  abdominal "twice"  . LEFT HEART CATHETERIZATION WITH CORONARY ANGIOGRAM N/A 10/04/2011   Procedure: LEFT HEART CATHETERIZATION WITH CORONARY ANGIOGRAM;  Surgeon: Jacolyn Reedy, MD;  Location: [facility] CATH LAB;  Service: Cardiovascular;  Laterality: N/A;  . REPAIR KNEE LIGAMENT  ~ 2008   left  . TONSILLECTOMY     as a child  . TRANSTHORACIC ECHOCARDIOGRAM  05/2013   EF 60-65%, trivial aortic regurg, no wall motion abnorm    Outpatient Medications Prior to Visit  Medication Sig Dispense Refill  . acetaminophen (TYLENOL) 325 MG tablet Take 325 mg by mouth every 6 (six) hours as needed.    Marland Kitchen amLODipine (NORVASC) 5 MG tablet TAKE 1 TABLET BY MOUTH DAILY 90 tablet 1  . aspirin 325 MG tablet Take 1 tablet (325 mg total) by mouth daily.    .  Cholecalciferol (VITAMIN D3) 1000 UNITS CAPS Take 1,000 Units by mouth daily.     Marland Kitchen co-enzyme Q-10 30 MG capsule Take 30 mg by mouth daily. Reported on 11/29/2015    . fluticasone (FLONASE) 50 MCG/ACT nasal spray Place 2 sprays into both nostrils daily. 16 g 6  . gabapentin (NEURONTIN) 100 MG capsule 1 tab po qAM, 1 tab po q afternoon, and 2 tabs po qhs 120 capsule 2  . Ginkgo Biloba 120 MG CAPS Take 1 capsule by mouth 3 (three) times daily.    Marland Kitchen glucosamine-chondroitin 500-400 MG tablet Take 1 tablet by mouth daily.    Marland Kitchen losartan (COZAAR) 50 MG tablet TAKE 1/2 TABLET BY MOUTH TWICE A DAY. 90 tablet 1  . nitroGLYCERIN (NITROSTAT) 0.4 MG SL tablet Place 1 tablet (0.4 mg total) under the tongue every 5 (five) minutes as needed for chest pain. 25 tablet 12  . Omega-3 Fatty Acids (FISH OIL) 1000 MG CAPS Take 1 capsule by mouth daily.    Marland Kitchen oxybutynin (DITROPAN) 5 MG tablet Take 2 tablets by mouth daily.  9  . pantoprazole (PROTONIX) 40 MG tablet Take 1 tablet (40 mg total) by mouth daily.    . polyethylene glycol (MIRALAX / GLYCOLAX) packet Take 17 g by mouth 2 (two) times daily. 72 each 11  . simvastatin (ZOCOR) 40 MG tablet Take 1 tablet (40 mg total) by mouth daily. 90 tablet 1  . trimethoprim (TRIMPEX) 100 MG tablet TAKE 1 TABLET (100 MG TOTAL) BY MOUTH DAILY. 90 tablet 3   Facility-Administered Medications Prior to Visit  Medication Dose Route Frequency Provider Last Rate Last Dose  . acetaminophen (TYLENOL) tablet 650 mg  650 mg Oral Q4H PRN Jacolyn Reedy, MD      . omega-3 acid ethyl esters (LOVAZA) capsule 2 g  2 g Oral BID Jacolyn Reedy, MD      . ondansetron [facility]) 4 mg in sodium chloride 0.9 % 50 mL IVPB  4 mg Intravenous Q6H PRN Jacolyn Reedy, MD        Allergies  Allergen Reactions  . Macrobid [Nitrofurantoin Macrocrystal] Other (See Comments)    "I had chills & fever"    ROS As per HPI  PE: Blood pressure (!) 104/46, pulse (!) 57, temperature 98.5 F (36.9 C),  temperature source Oral, resp. rate 16, height 5\' 4"  (1.626 m), weight 120 lb 8 oz (54.7 kg), SpO2 96 %. Gen: Alert, well appearing.  Patient is oriented to person, place, time, and situation. AFFECT: pleasant, lucid thought and speech. CV: RRR, soft systolic murmur, no diastolic murmur, no rub/gallop. Chest is clear, no wheezing or rales.  Normal symmetric air entry throughout both lung fields. No chest wall deformities or tenderness. EXT: no clubbing or cyanosis.  She has mild, non-inflamed varicose veins bilat, with 2+ pitting edema from pretibial regions down into ankles/feet bilat.    LABS:  Lab Results  Component Value Date   TSH 1.55 11/29/2015   Lab Results  Component Value Date   WBC 8.7 07/11/2016   HGB 12.5 07/11/2016   HCT 36.2 07/11/2016   MCV 87.9 07/11/2016   PLT 146 (L) 07/11/2016   Lab Results  Component Value Date   CREATININE 0.85 07/17/2016   BUN 16 07/17/2016   NA 132 (L) 07/17/2016   K 5.2 07/17/2016   CL 98 07/17/2016   CO2 28 07/17/2016   Lab Results  Component Value Date   ALT 17 07/11/2016   AST 25 07/11/2016   ALKPHOS 54 07/11/2016   BILITOT 0.7 07/11/2016   Lab Results  Component Value Date   CHOL 171 06/21/2015   Lab Results  Component Value Date   HDL 71 06/21/2015   Lab Results  Component Value Date   LDLCALC 95 06/21/2015   Lab Results  Component Value Date   TRIG 26 06/21/2015   Lab Results  Component Value Date   CHOLHDL 2.4 06/21/2015   Lab Results  Component Value Date   HGBA1C 5.8 (H) 05/30/2013   IMPRESSION AND PLAN:  1) HTN: The current medical regimen is effective;  continue present plan and medications. Lytes/Cr today.  2) Chronic LE venous insufficiency edema: likely worsened by amlodipine. Discussed low Na diet, elevation of legs above the level of the heart, use of OTC compression stockings.  3) Hypercholesterolemia: Lipid panel good 06/2015.  Tolerating statin.  Would like to get FLP at next f/u appt if pt  can fast. AST/ALT normal 4 mo ago.  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 Pt wants to try systemic treatment: salagen 5mg  bid rx'd today.  Therapeutic expectations and side effect profile of medication discussed today.  Patient's questions answered.  5) Preventative health care: shingrix rx given to patient to take to her pharmacy.  6) Hx of mild hyponatremia: recheck Na today.  An After Visit Summary was printed and given to the patient.  FOLLOW UP: Return in about 3 months (around 02/23/2017) for routine chronic illness f/u.  Signed:  Crissie Sickles, MD           11/23/2016

## 2016-11-24 LAB — BASIC METABOLIC PANEL
BUN: 23 mg/dL (ref 6–23)
CALCIUM: 9 mg/dL (ref 8.4–10.5)
CO2: 26 meq/L (ref 19–32)
CREATININE: 0.95 mg/dL (ref 0.40–1.20)
Chloride: 96 mEq/L (ref 96–112)
GFR: 59.11 mL/min — ABNORMAL LOW (ref >60.00)
Glucose, Bld: 105 mg/dL — ABNORMAL HIGH (ref 70–99)
Potassium: 5.2 mEq/L — ABNORMAL HIGH (ref 3.5–5.1)
Sodium: 129 mEq/L — ABNORMAL LOW (ref 135–145)

## 2016-12-27 ENCOUNTER — Encounter: Payer: Self-pay | Admitting: Family Medicine

## 2016-12-27 ENCOUNTER — Ambulatory Visit (INDEPENDENT_AMBULATORY_CARE_PROVIDER_SITE_OTHER): Payer: Medicare Other | Admitting: Family Medicine

## 2016-12-27 VITALS — BP 113/45 | HR 53 | Temp 97.6°F | Resp 16 | Ht 64.0 in | Wt 120.8 lb

## 2016-12-27 DIAGNOSIS — R600 Localized edema: Secondary | ICD-10-CM | POA: Diagnosis not present

## 2016-12-27 DIAGNOSIS — I872 Venous insufficiency (chronic) (peripheral): Secondary | ICD-10-CM

## 2016-12-27 NOTE — Progress Notes (Signed)
OFFICE VISIT  12/27/2016   CC:  Chief Complaint  Patient presents with  . Edema    feet and swelling   HPI:    Patient is a 83 y.o. Caucasian female who presents for swelling in lower legs and feet. A bit worse lately but still go down in while sleeping, better in morning.  No signif change in activity level or diet. Avoids high sodium foods or added salt.  Home bp's "normal" but pt cannot recall numbers.   No pain in lower legs or feet.  They feel a bit tight/distended at times.  No signif change in symmetry (R chronically a bit larger than L lower leg.  Past Medical History:  Diagnosis Date  . Abnormal finding on thyroid function test    per old records: T3 low, free T4 elevated, TSH normal.  Armour thyroid made her feel worse and T4 went up so med d/c'd and liothyronine 5 mcg tried but also made pt feel worse.  Endo (Dr. Loanne Drilling felt like Biotin was causing the abnormal TFT's).  Repeat TFTs OFF BIOTIN x 2 wks were NORMAL.  NO FURTHER THYROID TESTING NEEDED.  Marland Kitchen Anginal pain ([place])   . Arthritis    "think it's osteo; got some in my hands"  . Bladder infection, chronic 10/03/11   "have had one for over 1 yr"--has been on trimethaprim 100 mg qd since 2014  . CAD (coronary artery disease)    Cath 2009  Occluded LAD, 40-50% RCA and circ managed medically.  Repeat 06/22/15 no change.  . Chronic constipation    slow transit.  Barium enema to eval for colonic stricture 10/2014 was NORMAL  . Cognitive dysfunction    short term memory loss--takes ginko biloba  . COPD (chronic obstructive pulmonary disease) ([place])    Changes noted on CXR 2017  . GERD (gastroesophageal reflux disease)    (also LPR) Schatzki's ring, s/p dil '97; food impaction 08/2010, on PPI therapy since w/out further sx so no dil performed  . History of herpes zoster 06/2015   right side of chest; presented with chest pain mimicking [place]--cardiac eval showed stable CAD compared to 2009.  Marland Kitchen Hyperlipidemia   . Hypertension   .  Hyponatremia 10/03/2011   Na 131 on labs 02/2015 at [facility].  Baseline Na 132.  . Iron deficiency anemia 11/30/15   Hemoccults neg x 3 12/2015.  ?Mild malabsorption?  . Mixed stress and urge urinary incontinence   . RBBB with left anterior fascicular block    bifascicular block  . 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 Thrombocytopenia ([place]) 02/2013   Plts 114K  . Uterine cancer ([place]) 2000  . Xerostomia 2017/2018   Age related glandular atrophy + med effect (oxybutynin and gabapentin)    Past Surgical History:  Procedure Laterality Date  . ABDOMINAL HYSTERECTOMY  2000  . APPENDECTOMY  2000  . CARDIAC CATHETERIZATION N/A 06/22/2015   Stable single vessel CAD, EF normal.  Procedure: Left Heart Cath and Coronary Angiography;  Surgeon: Leonie Man, MD;  Location: [place] CV [facility];  Service: Cardiovascular;  Laterality: N/A;  . CATARACT EXTRACTION W/ INTRAOCULAR LENS  IMPLANT, BILATERAL  1990's  . [place]; 1960  . COLONOSCOPY  04/2001   Neg; pt cancelled screening colon 06/2011  . DILATION AND CURETTAGE OF UTERUS    . HERNIA  REPAIR     abdominal "twice"  . LEFT HEART CATHETERIZATION WITH CORONARY ANGIOGRAM N/A 10/04/2011   Procedure: LEFT HEART CATHETERIZATION WITH CORONARY ANGIOGRAM;  Surgeon: Jacolyn Reedy, MD;  Location: [facility] CATH LAB;  Service: Cardiovascular;  Laterality: N/A;  . REPAIR KNEE LIGAMENT  ~ 2008   left  . TONSILLECTOMY     as a child  . TRANSTHORACIC ECHOCARDIOGRAM  05/2013   EF 60-65%, trivial aortic regurg, no wall motion abnorm    Outpatient Medications Prior to Visit  Medication Sig Dispense Refill  . acetaminophen (TYLENOL) 325 MG tablet Take 325 mg by mouth every 6 (six) hours as needed.    Marland Kitchen amLODipine (NORVASC) 5 MG tablet TAKE 1 TABLET BY MOUTH DAILY 90 tablet 1  . aspirin 325 MG tablet Take 1 tablet (325 mg total) by mouth  daily.    . Cholecalciferol (VITAMIN D3) 1000 UNITS CAPS Take 1,000 Units by mouth daily.     Marland Kitchen co-enzyme Q-10 30 MG capsule Take 30 mg by mouth daily. Reported on 11/29/2015    . fluticasone (FLONASE) 50 MCG/ACT nasal spray Place 2 sprays into both nostrils daily. 16 g 6  . gabapentin (NEURONTIN) 100 MG capsule 1 tab po qAM, 1 tab po q afternoon, and 2 tabs po qhs 120 capsule 2  . Ginkgo Biloba 120 MG CAPS Take 1 capsule by mouth 3 (three) times daily.    Marland Kitchen glucosamine-chondroitin 500-400 MG tablet Take 1 tablet by mouth daily.    Marland Kitchen losartan (COZAAR) 50 MG tablet TAKE 1/2 TABLET BY MOUTH TWICE A DAY. 90 tablet 1  . nitroGLYCERIN (NITROSTAT) 0.4 MG SL tablet Place 1 tablet (0.4 mg total) under the tongue every 5 (five) minutes as needed for chest pain. 25 tablet 12  . Omega-3 Fatty Acids (FISH OIL) 1000 MG CAPS Take 1 capsule by mouth daily.    Marland Kitchen oxybutynin (DITROPAN) 5 MG tablet Take 2 tablets by mouth daily.  9  . pantoprazole (PROTONIX) 40 MG tablet Take 1 tablet (40 mg total) by mouth daily.    . pilocarpine (SALAGEN) 5 MG tablet 1 tab twice daily to treat dry mouth 60 tablet 4  . polyethylene glycol (MIRALAX / GLYCOLAX) packet Take 17 g by mouth 2 (two) times daily. 72 each 11  . simvastatin (ZOCOR) 40 MG tablet Take 1 tablet (40 mg total) by mouth daily. 90 tablet 1  . trimethoprim (TRIMPEX) 100 MG tablet TAKE 1 TABLET (100 MG TOTAL) BY MOUTH DAILY. 90 tablet 3  . Zoster Vac Recomb Adjuvanted [facility]) injection Repeat in 2-6 months (Patient not taking: Reported on 12/27/2016) 0.5 mL 1   Facility-Administered Medications Prior to Visit  Medication Dose Route Frequency Provider Last Rate Last Dose  . acetaminophen (TYLENOL) tablet 650 mg  650 mg Oral Q4H PRN Jacolyn Reedy, MD      . omega-3 acid ethyl esters (LOVAZA) capsule 2 g  2 g Oral BID Jacolyn Reedy, MD      . ondansetron [facility]) 4 mg in sodium chloride 0.9 % 50 mL IVPB  4 mg Intravenous Q6H PRN Jacolyn Reedy, MD         Allergies  Allergen Reactions  . Macrobid [Nitrofurantoin Macrocrystal] Other (See Comments)    "I had chills & fever"    ROS As per HPI  PE: Blood pressure (!) 113/45, pulse (!) 53, temperature 97.6 F (36.4 C), temperature source Oral, resp. rate 16, height 5\' 4"  (1.626 m), weight 120 lb  12 oz (54.8 kg), SpO2 99 %. Gen: Alert, well appearing.  Patient is oriented to person, place, time, and situation. Legs: 2-3 + pitting edema in both LL's from pretibial regions down into feet, with lots of scattered non-inflamed varicosities.  Some dispersed freckly hyperpigmentation on both LLs. No erythema or tenderness to palpation. R calf circumference at 12 cm below inf border of patella = 37 cm L  "         "                      "   "   "    "        "'           "    "   "           = 34 cm.  LABS:    Chemistry      Component Value Date/Time   NA 129 (L) 11/23/2016 1601   K 5.2 (H) 11/23/2016 1601   CL 96 11/23/2016 1601   CO2 26 11/23/2016 1601   BUN 23 11/23/2016 1601   CREATININE 0.95 11/23/2016 1601   CREATININE 0.85 07/17/2016 1645      Component Value Date/Time   CALCIUM 9.0 11/23/2016 1601   ALKPHOS 54 07/11/2016 1900   AST 25 07/11/2016 1900   ALT 17 07/11/2016 1900   BILITOT 0.7 07/11/2016 1900    T protein 07/11/16= 7 Albumin 07/11/16= 4.3  08/23/16: UA NEG for protein.   IMPRESSION AND PLAN:  Chronic lower extremity venous insufficiency edema; pretty stable. No changes suspicious for DVT. Once again, discussed the pathophys involved in this and the lack of "cure" available. Also discussed once again the measures she can take to minimize this: low Na intake, elevate legs above level of heart daily.  She declined rx for compression stockings today.  No diuretics at this time.   This condition is minimally bothersome to her. We considered d/c of amlodipine to see if this would lessen her LE edema significantly, esp since her bp has been well w/in normal lately.   However, we both agreed it would be best to keep everything the same at this time.  An After Visit Summary was printed and given to the patient.  FOLLOW UP: Return for d/c pt's september appt and reschedule for 6 mo from now (30 min).  Signed:  Crissie Sickles, MD           12/27/2016

## 2016-12-28 DIAGNOSIS — I452 Bifascicular block: Secondary | ICD-10-CM | POA: Diagnosis not present

## 2016-12-28 DIAGNOSIS — I25119 Atherosclerotic heart disease of native coronary artery with unspecified angina pectoris: Secondary | ICD-10-CM | POA: Diagnosis not present

## 2016-12-28 DIAGNOSIS — E785 Hyperlipidemia, unspecified: Secondary | ICD-10-CM | POA: Diagnosis not present

## 2016-12-28 DIAGNOSIS — I10 Essential (primary) hypertension: Secondary | ICD-10-CM | POA: Diagnosis not present

## 2016-12-29 ENCOUNTER — Encounter: Payer: Self-pay | Admitting: *Deleted

## 2017-01-01 ENCOUNTER — Other Ambulatory Visit: Payer: Self-pay | Admitting: Family Medicine

## 2017-01-02 DIAGNOSIS — N289 Disorder of kidney and ureter, unspecified: Secondary | ICD-10-CM | POA: Diagnosis not present

## 2017-01-02 DIAGNOSIS — N3020 Other chronic cystitis without hematuria: Secondary | ICD-10-CM | POA: Diagnosis not present

## 2017-01-02 DIAGNOSIS — R351 Nocturia: Secondary | ICD-10-CM | POA: Diagnosis not present

## 2017-02-02 ENCOUNTER — Ambulatory Visit (INDEPENDENT_AMBULATORY_CARE_PROVIDER_SITE_OTHER): Payer: Medicare Other | Admitting: Family Medicine

## 2017-02-02 ENCOUNTER — Encounter: Payer: Self-pay | Admitting: Family Medicine

## 2017-02-02 VITALS — BP 161/75 | HR 58 | Temp 98.2°F | Resp 16 | Wt 115.0 lb

## 2017-02-02 DIAGNOSIS — N390 Urinary tract infection, site not specified: Secondary | ICD-10-CM

## 2017-02-02 DIAGNOSIS — N3000 Acute cystitis without hematuria: Secondary | ICD-10-CM | POA: Diagnosis not present

## 2017-02-02 LAB — POCT URINALYSIS DIPSTICK
Bilirubin, UA: NEGATIVE
Blood, UA: NEGATIVE
Glucose, UA: NEGATIVE
KETONES UA: NEGATIVE
Leukocytes, UA: NEGATIVE
NITRITE UA: NEGATIVE
PH UA: 7.5 (ref 5.0–8.0)
PROTEIN UA: NEGATIVE
SPEC GRAV UA: 1.015 (ref 1.010–1.025)
UROBILINOGEN UA: 0.2 U/dL

## 2017-02-02 NOTE — Progress Notes (Signed)
OFFICE VISIT  02/02/2017   CC:  Chief Complaint  Patient presents with  . Urinary Tract Infection   HPI:    Patient is a 83 y.o. Caucasian female who presents for question of UTI. She went to her urologist about 2 weeks ago and was told she had UTI---she had no sx's at that time. They prescribed a course of amoxil and she took all the pills.  She is on daily trimethoprim for suppression/prevention of UTI.  She denies dysuria.  No new urinary urgency or frequency. No nausea or lower abd pain.  No flank pain or fever. She was told to come here for recheck to see if her UTI cleared---since she has no sx's usually with her UTI's.   Past Medical History:  Diagnosis Date  . Abnormal finding on thyroid function test    per old records: T3 low, free T4 elevated, TSH normal.  [place] thyroid made her feel worse and T4 went up so med d/c'd and liothyronine 5 mcg tried but also made pt feel worse.  Endo (Dr. Loanne Drilling felt like Biotin was causing the abnormal TFT's).  Repeat TFTs OFF BIOTIN x 2 wks were NORMAL.  NO FURTHER THYROID TESTING NEEDED.  Marland Kitchen Anginal pain ([place])   . Arthritis    "think it's osteo; got some in my hands"  . Bifascicular block   . Bladder infection, chronic 10/03/11   "have had one for over 1 yr"--has been on trimethaprim 100 mg qd since 2014  . CAD (coronary artery disease)    Cath 2009  Occluded LAD, 40-50% RCA and circ managed medically.  Repeat 06/22/15 no change.  . Chronic constipation    slow transit.  Barium enema to eval for colonic stricture 10/2014 was NORMAL  . Cognitive dysfunction    short term memory loss--takes ginko biloba  . COPD (chronic obstructive pulmonary disease) ([place])    Changes noted on CXR 2017  . GERD (gastroesophageal reflux disease)    (also LPR) Schatzki's ring, s/p dil '97; food impaction 08/2010, on PPI therapy since w/out further sx so no dil performed  . History of herpes zoster 06/2015   right side of chest; presented with chest pain  mimicking [place]--cardiac eval showed stable CAD compared to 2009.  Marland Kitchen Hyperlipidemia   . Hypertension   . Hyponatremia 10/03/2011   Na 131 on labs 02/2015 at [facility].  Baseline Na 132.  . Iron deficiency anemia 11/30/15   Hemoccults neg x 3 12/2015.  ?Mild malabsorption?  . Mixed stress and urge urinary incontinence   . RBBB with left anterior fascicular block    bifascicular block  . 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 Thrombocytopenia ([place]) 02/2013   Plts 114K  . Uterine cancer ([place]) 2000  . Xerostomia 2017/2018   Age related glandular atrophy + med effect (oxybutynin and gabapentin)    Past Surgical History:  Procedure Laterality Date  . ABDOMINAL HYSTERECTOMY  2000  . APPENDECTOMY  2000  . CARDIAC CATHETERIZATION N/A 06/22/2015   Stable single vessel CAD, EF normal.  Procedure: Left Heart Cath and Coronary Angiography;  Surgeon: Leonie Man, MD;  Location: [place] CV [facility];  Service: Cardiovascular;  Laterality: N/A;  . CATARACT EXTRACTION W/ INTRAOCULAR LENS  IMPLANT, BILATERAL  1990's  . [place]; 1960  . COLONOSCOPY  04/2001   Neg; pt  cancelled screening colon 06/2011  . DILATION AND CURETTAGE OF UTERUS    . HERNIA REPAIR     abdominal "twice"  . LEFT HEART CATHETERIZATION WITH CORONARY ANGIOGRAM N/A 10/04/2011   Procedure: LEFT HEART CATHETERIZATION WITH CORONARY ANGIOGRAM;  Surgeon: Jacolyn Reedy, MD;  Location: [facility] CATH LAB;  Service: Cardiovascular;  Laterality: N/A;  . REPAIR KNEE LIGAMENT  ~ 2008   left  . TONSILLECTOMY     as a child  . TRANSTHORACIC ECHOCARDIOGRAM  05/2013   EF 60-65%, trivial aortic regurg, no wall motion abnorm    Outpatient Medications Prior to Visit  Medication Sig Dispense Refill  . acetaminophen (TYLENOL) 325 MG tablet Take 325 mg by mouth every 6 (six) hours as needed.    Marland Kitchen amLODipine (NORVASC) 5 MG tablet  TAKE 1 TABLET BY MOUTH DAILY 90 tablet 1  . aspirin 325 MG tablet Take 1 tablet (325 mg total) by mouth daily.    . Cholecalciferol (VITAMIN D3) 1000 UNITS CAPS Take 1,000 Units by mouth daily.     Marland Kitchen co-enzyme Q-10 30 MG capsule Take 30 mg by mouth daily. Reported on 11/29/2015    . fluticasone (FLONASE) 50 MCG/ACT nasal spray Place 2 sprays into both nostrils daily. 16 g 6  . gabapentin (NEURONTIN) 100 MG capsule 1 tab po qAM, 1 tab po q afternoon, and 2 tabs po qhs 120 capsule 2  . Ginkgo Biloba 120 MG CAPS Take 1 capsule by mouth 3 (three) times daily.    Marland Kitchen glucosamine-chondroitin 500-400 MG tablet Take 1 tablet by mouth daily.    Marland Kitchen losartan (COZAAR) 50 MG tablet TAKE 1/2 TABLET BY MOUTH TWICE A DAY. 90 tablet 1  . nitroGLYCERIN (NITROSTAT) 0.4 MG SL tablet Place 1 tablet (0.4 mg total) under the tongue every 5 (five) minutes as needed for chest pain. 25 tablet 12  . Omega-3 Fatty Acids (FISH OIL) 1000 MG CAPS Take 1 capsule by mouth daily.    Marland Kitchen oxybutynin (DITROPAN) 5 MG tablet Take 2 tablets by mouth daily.  9  . pantoprazole (PROTONIX) 40 MG tablet Take 1 tablet (40 mg total) by mouth daily.    . pilocarpine (SALAGEN) 5 MG tablet 1 tab twice daily to treat dry mouth 60 tablet 4  . polyethylene glycol powder (GLYCOLAX/MIRALAX) powder TAKE 17 G BY MOUTH 2 (TWO) TIMES DAILY. 527 g 5  . simvastatin (ZOCOR) 40 MG tablet Take 1 tablet (40 mg total) by mouth daily. 90 tablet 1  . trimethoprim (TRIMPEX) 100 MG tablet TAKE 1 TABLET (100 MG TOTAL) BY MOUTH DAILY. 90 tablet 3   Facility-Administered Medications Prior to Visit  Medication Dose Route Frequency Provider Last Rate Last Dose  . acetaminophen (TYLENOL) tablet 650 mg  650 mg Oral Q4H PRN Jacolyn Reedy, MD      . omega-3 acid ethyl esters (LOVAZA) capsule 2 g  2 g Oral BID Jacolyn Reedy, MD      . ondansetron [facility]) 4 mg in sodium chloride 0.9 % 50 mL IVPB  4 mg Intravenous Q6H PRN Jacolyn Reedy, MD        Allergies   Allergen Reactions  . Macrobid [Nitrofurantoin Macrocrystal] Other (See Comments)    "I had chills & fever"    ROS As per HPI  PE: Blood pressure (!) 161/75, pulse (!) 58, temperature 98.2 F (36.8 C), temperature source Oral, resp. rate 16, weight 115 lb (52.2 kg), SpO2 99 %. Gen: Alert, well appearing.  Patient  is oriented to person, place, time, and situation. AFFECT: pleasant, lucid thought and speech. No further exam today.  LABS:    Chemistry      Component Value Date/Time   NA 129 (L) 11/23/2016 1601   K 5.2 (H) 11/23/2016 1601   CL 96 11/23/2016 1601   CO2 26 11/23/2016 1601   BUN 23 11/23/2016 1601   CREATININE 0.95 11/23/2016 1601   CREATININE 0.85 07/17/2016 1645      Component Value Date/Time   CALCIUM 9.0 11/23/2016 1601   ALKPHOS 54 07/11/2016 1900   AST 25 07/11/2016 1900   ALT 17 07/11/2016 1900   BILITOT 0.7 07/11/2016 1900     CC UA today: NORMAL  IMPRESSION AND PLAN:  1) UTI, asymptomatic--resolved with treatment by urologist (amoxil). Reassured pt that her UA is completely normal today. She'll continue trimethoprim daily for UTI prophylaxis since she has hx of recurrent UTI AND b/c usually her UTIs cause her no symptoms.  An After Visit Summary was printed and given to the patient.  FOLLOW UP: Return for keep appt planned for 06/2017..  Signed:  Crissie Sickles, MD           02/02/2017

## 2017-02-08 ENCOUNTER — Other Ambulatory Visit: Payer: Self-pay | Admitting: *Deleted

## 2017-02-08 MED ORDER — LOSARTAN POTASSIUM 50 MG PO TABS: 25.0000 mg | ORAL_TABLET | Freq: Two times a day (BID) | ORAL | 1 refills | Status: DC

## 2017-02-08 NOTE — Telephone Encounter (Signed)
[facility] 

## 2017-02-20 DIAGNOSIS — H04129 Dry eye syndrome of unspecified lacrimal gland: Secondary | ICD-10-CM | POA: Diagnosis not present

## 2017-02-20 DIAGNOSIS — H1820 Unspecified corneal edema: Secondary | ICD-10-CM | POA: Diagnosis not present

## 2017-02-20 DIAGNOSIS — Z23 Encounter for immunization: Secondary | ICD-10-CM | POA: Diagnosis not present

## 2017-03-02 ENCOUNTER — Ambulatory Visit (INDEPENDENT_AMBULATORY_CARE_PROVIDER_SITE_OTHER): Payer: Medicare Other | Admitting: Family Medicine

## 2017-03-02 ENCOUNTER — Encounter: Payer: Self-pay | Admitting: Family Medicine

## 2017-03-02 VITALS — BP 160/70 | HR 59 | Temp 97.4°F | Resp 16 | Ht 64.0 in | Wt 115.2 lb

## 2017-03-02 DIAGNOSIS — N3001 Acute cystitis with hematuria: Secondary | ICD-10-CM | POA: Diagnosis not present

## 2017-03-02 DIAGNOSIS — R300 Dysuria: Secondary | ICD-10-CM | POA: Diagnosis not present

## 2017-03-02 LAB — POC URINALSYSI DIPSTICK (AUTOMATED)
BILIRUBIN UA: NEGATIVE
Glucose, UA: NEGATIVE
Ketones, UA: NEGATIVE
NITRITE UA: POSITIVE
PH UA: 7 (ref 5.0–8.0)
Protein, UA: 30
SPEC GRAV UA: 1.015 (ref 1.010–1.025)
UROBILINOGEN UA: 0.2 U/dL

## 2017-03-02 MED ORDER — CIPROFLOXACIN HCL 500 MG PO TABS: 500.0000 mg | ORAL_TABLET | Freq: Two times a day (BID) | ORAL | 0 refills | Status: DC

## 2017-03-02 NOTE — Progress Notes (Signed)
OFFICE VISIT  03/02/2017   CC:  Chief Complaint  Patient presents with  . Urinary Tract Infection   HPI:    Patient is a 83 y.o. Caucasian female who presents for question of UTI. Onset 2 d/a of dysuria, some urinary urgency/frequency, has some occ incomplete emptying feeling. NO otc med taken.  No fevers, flank pain, back pain, or n/v.  No gross hematuria.  Past Medical History:  Diagnosis Date  . Abnormal finding on thyroid function test    per old records: T3 low, free T4 elevated, TSH normal.  [place] thyroid made her feel worse and T4 went up so med d/c'd and liothyronine 5 mcg tried but also made pt feel worse.  Endo (Dr. Loanne Drilling felt like Biotin was causing the abnormal TFT's).  Repeat TFTs OFF BIOTIN x 2 wks were NORMAL.  NO FURTHER THYROID TESTING NEEDED.  Marland Kitchen Anginal pain ([place])   . Arthritis    "think it's osteo; got some in my hands"  . Bifascicular block   . Bladder infection, chronic 10/03/11   "have had one for over 1 yr"--has been on trimethaprim 100 mg qd since 2014  . CAD (coronary artery disease)    Cath 2009  Occluded LAD, 40-50% RCA and circ managed medically.  Repeat 06/22/15 no change.  . Chronic constipation    slow transit.  Barium enema to eval for colonic stricture 10/2014 was NORMAL  . Cognitive dysfunction    short term memory loss--takes ginko biloba  . COPD (chronic obstructive pulmonary disease) ([place])    Changes noted on CXR 2017  . GERD (gastroesophageal reflux disease)    (also LPR) Schatzki's ring, s/p dil '97; food impaction 08/2010, on PPI therapy since w/out further sx so no dil performed  . History of herpes zoster 06/2015   right side of chest; presented with chest pain mimicking [place]--cardiac eval showed stable CAD compared to 2009.  Marland Kitchen Hyperlipidemia   . Hypertension   . Hyponatremia 10/03/2011   Na 131 on labs 02/2015 at [facility].  Baseline Na 132.  . Iron deficiency anemia 11/30/15   Hemoccults neg x 3 12/2015.  ?Mild malabsorption?  . Mixed  stress and urge urinary incontinence   . RBBB with left anterior fascicular block    bifascicular block  . 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 Thrombocytopenia ([place]) 02/2013   Plts 114K  . Uterine cancer ([place]) 2000  . Xerostomia 2017/2018   Age related glandular atrophy + med effect (oxybutynin and gabapentin)    Past Surgical History:  Procedure Laterality Date  . ABDOMINAL HYSTERECTOMY  2000  . APPENDECTOMY  2000  . CARDIAC CATHETERIZATION N/A 06/22/2015   Stable single vessel CAD, EF normal.  Procedure: Left Heart Cath and Coronary Angiography;  Surgeon: Leonie Man, MD;  Location: [place] CV LAB;  Service: Cardiovascular;  Laterality: N/A;  . CATARACT EXTRACTION W/ INTRAOCULAR LENS  IMPLANT, BILATERAL  1990's  . [place]; 1960  . COLONOSCOPY  04/2001   Neg; pt cancelled screening colon 06/2011  . DILATION AND CURETTAGE OF UTERUS    . HERNIA REPAIR     abdominal "twice"  . LEFT HEART CATHETERIZATION WITH CORONARY ANGIOGRAM N/A 10/04/2011   Procedure: LEFT HEART CATHETERIZATION WITH CORONARY ANGIOGRAM;  Surgeon: Jacolyn Reedy, MD;  Location: [facility] CATH LAB;  Service: Cardiovascular;  Laterality:  N/A;  . REPAIR KNEE LIGAMENT  ~ 2008   left  . TONSILLECTOMY     as a child  . TRANSTHORACIC ECHOCARDIOGRAM  05/2013   EF 60-65%, trivial aortic regurg, no wall motion abnorm    Outpatient Medications Prior to Visit  Medication Sig Dispense Refill  . acetaminophen (TYLENOL) 325 MG tablet Take 325 mg by mouth every 6 (six) hours as needed.    Marland Kitchen amLODipine (NORVASC) 5 MG tablet TAKE 1 TABLET BY MOUTH DAILY 90 tablet 1  . aspirin 325 MG tablet Take 1 tablet (325 mg total) by mouth daily.    . Cholecalciferol (VITAMIN D3) 1000 UNITS CAPS Take 1,000 Units by mouth daily.     Marland Kitchen co-enzyme Q-10 30 MG capsule Take 30 mg by mouth daily. Reported on 11/29/2015    .  fluticasone (FLONASE) 50 MCG/ACT nasal spray Place 2 sprays into both nostrils daily. 16 g 6  . gabapentin (NEURONTIN) 100 MG capsule 1 tab po qAM, 1 tab po q afternoon, and 2 tabs po qhs 120 capsule 2  . Ginkgo Biloba 120 MG CAPS Take 1 capsule by mouth 3 (three) times daily.    Marland Kitchen glucosamine-chondroitin 500-400 MG tablet Take 1 tablet by mouth daily.    Marland Kitchen losartan (COZAAR) 50 MG tablet Take 0.5 tablets (25 mg total) by mouth 2 (two) times daily. 90 tablet 1  . nitroGLYCERIN (NITROSTAT) 0.4 MG SL tablet Place 1 tablet (0.4 mg total) under the tongue every 5 (five) minutes as needed for chest pain. 25 tablet 12  . Omega-3 Fatty Acids (FISH OIL) 1000 MG CAPS Take 1 capsule by mouth daily.    Marland Kitchen oxybutynin (DITROPAN) 5 MG tablet Take 2 tablets by mouth daily.  9  . pantoprazole (PROTONIX) 40 MG tablet Take 1 tablet (40 mg total) by mouth daily.    . pilocarpine (SALAGEN) 5 MG tablet 1 tab twice daily to treat dry mouth 60 tablet 4  . polyethylene glycol powder (GLYCOLAX/MIRALAX) powder TAKE 17 G BY MOUTH 2 (TWO) TIMES DAILY. 527 g 5  . simvastatin (ZOCOR) 40 MG tablet Take 1 tablet (40 mg total) by mouth daily. 90 tablet 1  . trimethoprim (TRIMPEX) 100 MG tablet TAKE 1 TABLET (100 MG TOTAL) BY MOUTH DAILY. 90 tablet 3   Facility-Administered Medications Prior to Visit  Medication Dose Route Frequency Provider Last Rate Last Dose  . acetaminophen (TYLENOL) tablet 650 mg  650 mg Oral Q4H PRN Jacolyn Reedy, MD      . omega-3 acid ethyl esters (LOVAZA) capsule 2 g  2 g Oral BID Jacolyn Reedy, MD      . ondansetron [facility]) 4 mg in sodium chloride 0.9 % 50 mL IVPB  4 mg Intravenous Q6H PRN Jacolyn Reedy, MD        Allergies  Allergen Reactions  . Macrobid [Nitrofurantoin Macrocrystal] Other (See Comments)    "I had chills & fever"    ROS As per HPI  PE: Blood pressure (!) 160/70, pulse (!) 59, temperature (!) 97.4 F (36.3 C), temperature source Oral, resp. rate 16, height 5\' 4"   (1.626 m), weight 115 lb 4 oz (52.3 kg), SpO2 99 %. Gen: Alert, well appearing.  Patient is oriented to person, place, time, and situation. AFFECT: pleasant, lucid thought and speech. No further exam today.  LABS:    Chemistry      Component Value Date/Time   NA 129 (L) 11/23/2016 1601   K 5.2 (H) 11/23/2016 1601  CL 96 11/23/2016 1601   CO2 26 11/23/2016 1601   BUN 23 11/23/2016 1601   CREATININE 0.95 11/23/2016 1601   CREATININE 0.85 07/17/2016 1645      Component Value Date/Time   CALCIUM 9.0 11/23/2016 1601   ALKPHOS 54 07/11/2016 1900   AST 25 07/11/2016 1900   ALT 17 07/11/2016 1900   BILITOT 0.7 07/11/2016 1900     POC UA today: small blood, nitrite +, Large LEU, color light yellow/slightly cloudy  IMPRESSION AND PLAN:  UTI: Send urine for c/s. Start cipro 500 mg bid x 3d. Signs/symptoms to call or return for were reviewed and pt expressed understanding.  An After Visit Summary was printed and given to the patient.  FOLLOW UP: No Follow-up on file.  Signed:  Crissie Sickles, MD           03/02/2017

## 2017-03-02 NOTE — Patient Instructions (Signed)
Vit E--break open the rubbery pill and rub the fluid on your lip.

## 2017-03-05 ENCOUNTER — Other Ambulatory Visit: Payer: Self-pay | Admitting: *Deleted

## 2017-03-05 LAB — URINE CULTURE
MICRO NUMBER:: 81081226
SPECIMEN QUALITY:: ADEQUATE

## 2017-03-05 MED ORDER — CIPROFLOXACIN HCL 500 MG PO TABS: 500.0000 mg | ORAL_TABLET | Freq: Two times a day (BID) | ORAL | 0 refills | Status: AC

## 2017-03-29 ENCOUNTER — Encounter: Payer: Self-pay | Admitting: Family Medicine

## 2017-03-29 ENCOUNTER — Ambulatory Visit (INDEPENDENT_AMBULATORY_CARE_PROVIDER_SITE_OTHER): Payer: Medicare Other | Admitting: Family Medicine

## 2017-03-29 VITALS — BP 158/65 | HR 60 | Temp 98.1°F | Resp 16 | Ht 64.0 in | Wt 117.0 lb

## 2017-03-29 DIAGNOSIS — L720 Epidermal cyst: Secondary | ICD-10-CM | POA: Diagnosis not present

## 2017-03-29 DIAGNOSIS — K117 Disturbances of salivary secretion: Secondary | ICD-10-CM

## 2017-03-29 DIAGNOSIS — R682 Dry mouth, unspecified: Secondary | ICD-10-CM | POA: Diagnosis not present

## 2017-03-29 DIAGNOSIS — S0083XA Contusion of other part of head, initial encounter: Secondary | ICD-10-CM

## 2017-03-29 NOTE — Patient Instructions (Signed)
Apply a warm soak or a heating pad to the cyst on your upper back for 20 minutes TWICE a day. When drainage starts coming out of this, squeeze it to try to get all the fluid out.   Cover with band-aid after it starts to drain.  Stop your oxybutynin for 5 days and see if your dry mouth significantly improves.

## 2017-03-29 NOTE — Progress Notes (Signed)
OFFICE VISIT  03/29/2017   CC:  Chief Complaint  Patient presents with  . Sore mouth/throat    pt thinks its from dry mouth    HPI:    Patient is a 83 y.o. Caucasian female who presents for several complaints:  1) Facial bruising: she reached up on shelf to try to put something in (a cooler) and it came down and hit her in the face. This was 6 days ago.  She notes pain when it happened---in face, but not much pain at all since then.  NO LOC.  No HA.  Last few days no hurting at all except mild soreness.  2) Noted a skin "growth" on back of neck today.  No pain or itching.  3) Dry mouth still bothering her a lot.  Dentist gave her a saturated calcium and phosphate rinse that she tried this morning and she is not sure yet whether it helped or not.     Past Medical History:  Diagnosis Date  . Abnormal finding on thyroid function test    per old records: T3 low, free T4 elevated, TSH normal.  Armour thyroid made her feel worse and T4 went up so med d/c'd and liothyronine 5 mcg tried but also made pt feel worse.  Endo (Dr. Loanne Drilling felt like Biotin was causing the abnormal TFT's).  Repeat TFTs OFF BIOTIN x 2 wks were NORMAL.  NO FURTHER THYROID TESTING NEEDED.  Marland Kitchen Anginal pain ([place])   . Arthritis    "think it's osteo; got some in my hands"  . Bifascicular block   . Bladder infection, chronic 10/03/11   "have had one for over 1 yr"--has been on trimethaprim 100 mg qd since 2014  . CAD (coronary artery disease)    Cath 2009  Occluded LAD, 40-50% RCA and circ managed medically.  Repeat 06/22/15 no change.  . Chronic constipation    slow transit.  Barium enema to eval for colonic stricture 10/2014 was NORMAL  . Cognitive dysfunction    short term memory loss--takes ginko biloba  . COPD (chronic obstructive pulmonary disease) ([place])    Changes noted on CXR 2017  . GERD (gastroesophageal reflux disease)    (also LPR) Schatzki's ring, s/p dil '97; food impaction 08/2010, on PPI therapy  since w/out further sx so no dil performed  . History of herpes zoster 06/2015   right side of chest; presented with chest pain mimicking USA--cardiac eval showed stable CAD compared to 2009.  Marland Kitchen Hyperlipidemia   . Hypertension   . Hyponatremia 10/03/2011   Na 131 on labs 02/2015 at [facility].  Baseline Na 132.  . Iron deficiency anemia 11/30/15   Hemoccults neg x 3 12/2015.  ?Mild malabsorption?  . Mixed stress and urge urinary incontinence   . RBBB with left anterior fascicular block    bifascicular block  . 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 Thrombocytopenia ([place]) 02/2013   Plts 114K  . Uterine cancer ([place]) 2000  . Xerostomia 2017/2018   Age related glandular atrophy + med effect (oxybutynin and gabapentin)    Past Surgical History:  Procedure Laterality Date  . ABDOMINAL HYSTERECTOMY  2000  . APPENDECTOMY  2000  . CARDIAC CATHETERIZATION N/A 06/22/2015   Stable single vessel CAD, EF normal.  Procedure: Left Heart Cath and Coronary Angiography;  Surgeon: Leonie Man, MD;  Location:  [place] INVASIVE CV LAB;  Service: Cardiovascular;  Laterality: N/A;  . CATARACT EXTRACTION W/ INTRAOCULAR LENS  IMPLANT, BILATERAL  1990's  . [place]; 1960  . COLONOSCOPY  04/2001   Neg; pt cancelled screening colon 06/2011  . DILATION AND CURETTAGE OF UTERUS    . HERNIA REPAIR     abdominal "twice"  . LEFT HEART CATHETERIZATION WITH CORONARY ANGIOGRAM N/A 10/04/2011   Procedure: LEFT HEART CATHETERIZATION WITH CORONARY ANGIOGRAM;  Surgeon: Jacolyn Reedy, MD;  Location: [facility] CATH LAB;  Service: Cardiovascular;  Laterality: N/A;  . REPAIR KNEE LIGAMENT  ~ 2008   left  . TONSILLECTOMY     as a child  . TRANSTHORACIC ECHOCARDIOGRAM  05/2013   EF 60-65%, trivial aortic regurg, no wall motion abnorm    Outpatient Medications Prior to Visit  Medication Sig Dispense Refill  .  acetaminophen (TYLENOL) 325 MG tablet Take 325 mg by mouth every 6 (six) hours as needed.    Marland Kitchen amLODipine (NORVASC) 5 MG tablet TAKE 1 TABLET BY MOUTH DAILY 90 tablet 1  . aspirin 325 MG tablet Take 1 tablet (325 mg total) by mouth daily.    . Cholecalciferol (VITAMIN D3) 1000 UNITS CAPS Take 1,000 Units by mouth daily.     Marland Kitchen co-enzyme Q-10 30 MG capsule Take 30 mg by mouth daily. Reported on 11/29/2015    . fluticasone (FLONASE) 50 MCG/ACT nasal spray Place 2 sprays into both nostrils daily. 16 g 6  . gabapentin (NEURONTIN) 100 MG capsule 1 tab po qAM, 1 tab po q afternoon, and 2 tabs po qhs 120 capsule 2  . Ginkgo Biloba 120 MG CAPS Take 1 capsule by mouth 3 (three) times daily.    Marland Kitchen glucosamine-chondroitin 500-400 MG tablet Take 1 tablet by mouth daily.    Marland Kitchen losartan (COZAAR) 50 MG tablet Take 0.5 tablets (25 mg total) by mouth 2 (two) times daily. 90 tablet 1  . nitroGLYCERIN (NITROSTAT) 0.4 MG SL tablet Place 1 tablet (0.4 mg total) under the tongue every 5 (five) minutes as needed for chest pain. 25 tablet 12  . Omega-3 Fatty Acids (FISH OIL) 1000 MG CAPS Take 1 capsule by mouth daily.    Marland Kitchen oxybutynin (DITROPAN) 5 MG tablet Take 2 tablets by mouth daily.  9  . pantoprazole (PROTONIX) 40 MG tablet Take 1 tablet (40 mg total) by mouth daily.    . pilocarpine (SALAGEN) 5 MG tablet 1 tab twice daily to treat dry mouth 60 tablet 4  . polyethylene glycol powder (GLYCOLAX/MIRALAX) powder TAKE 17 G BY MOUTH 2 (TWO) TIMES DAILY. 527 g 5  . simvastatin (ZOCOR) 40 MG tablet Take 1 tablet (40 mg total) by mouth daily. 90 tablet 1  . trimethoprim (TRIMPEX) 100 MG tablet TAKE 1 TABLET (100 MG TOTAL) BY MOUTH DAILY. 90 tablet 3   Facility-Administered Medications Prior to Visit  Medication Dose Route Frequency Provider Last Rate Last Dose  . acetaminophen (TYLENOL) tablet 650 mg  650 mg Oral Q4H PRN Jacolyn Reedy, MD      . omega-3 acid ethyl esters (LOVAZA) capsule 2 g  2 g Oral BID Jacolyn Reedy, MD      . ondansetron [facility]) 4 mg in sodium chloride 0.9 % 50 mL IVPB  4 mg Intravenous Q6H PRN Jacolyn Reedy, MD        Allergies  Allergen Reactions  . Macrobid [Nitrofurantoin Macrocrystal] Other (See Comments)    "I had chills &  fever"    ROS As per HPI  PE: Blood pressure (!) 158/65, pulse 60, temperature 98.1 F (36.7 C), temperature source Oral, resp. rate 16, height 5\' 4"  (1.626 m), weight 117 lb (53.1 kg), SpO2 100 %. Gen: Alert, well appearing.  Patient is oriented to person, place, time, and situation. AFFECT: pleasant, lucid thought and speech. TGY:BWLS: no injection, icteris, swelling, or exudate.  EOMI, PERRLA. Mouth: lips without lesion/swelling.  Oral mucosa pink and moist. Oropharynx without erythema, exudate, or swelling.  Neck - No masses or thyromegaly or limitation in range of motion CV: RRR, no m/r/g.   LUNGS: CTA bilat, nonlabored resps, good aeration in all lung fields. Face: diffuse ecchymoses over nose, eyebrows, around orbits.  Minimal swelling of facial soft tissues. Mild TTP in medial aspect of left globe and on proximal aspect of nasal bridge.  No nose deformity.  EOMI w/out pain or blurry vision. BACK: upper thoracic region with 1.5-2 cm oval subQ cystic lesion, mild fluctuance, without tenderness.  No drainage.  No surrounding erythema, no streaking.  LABS:    Chemistry      Component Value Date/Time   NA 129 (L) 11/23/2016 1601   K 5.2 (H) 11/23/2016 1601   CL 96 11/23/2016 1601   CO2 26 11/23/2016 1601   BUN 23 11/23/2016 1601   CREATININE 0.95 11/23/2016 1601   CREATININE 0.85 07/17/2016 1645      Component Value Date/Time   CALCIUM 9.0 11/23/2016 1601   ALKPHOS 54 07/11/2016 1900   AST 25 07/11/2016 1900   ALT 17 07/11/2016 1900   BILITOT 0.7 07/11/2016 1900     Lab Results  Component Value Date   WBC 8.7 07/11/2016   HGB 12.5 07/11/2016   HCT 36.2 07/11/2016   MCV 87.9 07/11/2016   PLT 146 (L) 07/11/2016      IMPRESSION AND PLAN:  1) Xerostomia, most likely from polypharmacy, particularly her oxybutynin.  I recommended she stop this med for 5 days and see if her dry mouth remarkably improves.  Continue the rinse the dentist gave her, biotene rinse, and pilocarpine.  2) Epidermal inclusion cyst: upper back.  No sign of infection. Instructions: Apply a warm soak or a heating pad to the cyst on your upper back for 20 minutes TWICE a day. When drainage starts coming out of this, squeeze it to try to get all the fluid out.   Cover with band-aid after it starts to drain.  3) Facial contusion: minimal sequelae.  I will check facial x-ray to make sure her left orbit and prox nasal bridge are intact.  An After Visit Summary was printed and given to the patient.  FOLLOW UP: Return in about 1 week (around 04/05/2017) for f/u dry mouth, face contusion, cyst.  Signed:  Crissie Sickles, MD           03/29/2017

## 2017-04-02 ENCOUNTER — Ambulatory Visit (INDEPENDENT_AMBULATORY_CARE_PROVIDER_SITE_OTHER): Payer: Medicare Other

## 2017-04-02 DIAGNOSIS — S0033XD Contusion of nose, subsequent encounter: Secondary | ICD-10-CM

## 2017-04-02 DIAGNOSIS — W208XXD Other cause of strike by thrown, projected or falling object, subsequent encounter: Secondary | ICD-10-CM | POA: Diagnosis not present

## 2017-04-02 DIAGNOSIS — S0083XA Contusion of other part of head, initial encounter: Secondary | ICD-10-CM

## 2017-04-02 DIAGNOSIS — S0033XA Contusion of nose, initial encounter: Secondary | ICD-10-CM | POA: Diagnosis not present

## 2017-04-05 ENCOUNTER — Encounter: Payer: Self-pay | Admitting: Family Medicine

## 2017-04-05 ENCOUNTER — Ambulatory Visit (INDEPENDENT_AMBULATORY_CARE_PROVIDER_SITE_OTHER): Payer: Medicare Other | Admitting: Family Medicine

## 2017-04-05 VITALS — BP 160/71 | HR 58 | Temp 97.9°F | Resp 16 | Ht 64.0 in | Wt 120.2 lb

## 2017-04-05 DIAGNOSIS — K117 Disturbances of salivary secretion: Secondary | ICD-10-CM

## 2017-04-05 DIAGNOSIS — R7301 Impaired fasting glucose: Secondary | ICD-10-CM | POA: Diagnosis not present

## 2017-04-05 DIAGNOSIS — N3281 Overactive bladder: Secondary | ICD-10-CM

## 2017-04-05 DIAGNOSIS — R682 Dry mouth, unspecified: Secondary | ICD-10-CM

## 2017-04-05 DIAGNOSIS — T50905D Adverse effect of unspecified drugs, medicaments and biological substances, subsequent encounter: Secondary | ICD-10-CM | POA: Diagnosis not present

## 2017-04-05 LAB — GLUCOSE, POCT (MANUAL RESULT ENTRY): POC GLUCOSE: 109 mg/dL — AB (ref 70–99)

## 2017-04-05 MED ORDER — SOLIFENACIN SUCCINATE 5 MG PO TABS: 5.0000 mg | ORAL_TABLET | Freq: Every day | ORAL | 6 refills | Status: DC

## 2017-04-05 NOTE — Progress Notes (Signed)
OFFICE VISIT  04/05/2017   CC:  Chief Complaint  Patient presents with  . Follow-up    dry mouth, face contusion, cyst on back     HPI:    Patient is a 83 y.o. Caucasian female who presents for 1 week f/u facial contusion and dry mouth. X-ray of face showed no fractures. Instructions last week were to try 5d off her oxybutynin to see if her dry mouth significantly improved. She did notice improvement in level of dry mouth since d/c of this med, but also noted return of worsening OAB sx's.  Pt with hx of very mild IFG about 5 mo ago, I gave some dietary recommendations-which she says she has followed. She is very worried about diabetes and asks for a glucose check in the office today.    Past Medical History:  Diagnosis Date  . Abnormal finding on thyroid function test    per old records: T3 low, free T4 elevated, TSH normal.  [place] thyroid made her feel worse and T4 went up so med d/c'd and liothyronine 5 mcg tried but also made pt feel worse.  Endo (Dr. Loanne Drilling felt like Biotin was causing the abnormal TFT's).  Repeat TFTs OFF BIOTIN x 2 wks were NORMAL.  NO FURTHER THYROID TESTING NEEDED.  Marland Kitchen Anginal pain ([place])   . Arthritis    "think it's osteo; got some in my hands"  . Bifascicular block   . Bladder infection, chronic 10/03/11   "have had one for over 1 yr"--has been on trimethaprim 100 mg qd since 2014  . CAD (coronary artery disease)    Cath 2009  Occluded LAD, 40-50% RCA and circ managed medically.  Repeat 06/22/15 no change.  . Chronic constipation    slow transit.  Barium enema to eval for colonic stricture 10/2014 was NORMAL  . Cognitive dysfunction    short term memory loss--takes ginko biloba  . COPD (chronic obstructive pulmonary disease) ([place])    Changes noted on CXR 2017  . GERD (gastroesophageal reflux disease)    (also LPR) Schatzki's ring, s/p dil '97; food impaction 08/2010, on PPI therapy since w/out further sx so no dil performed  . History of herpes  zoster 06/2015   right side of chest; presented with chest pain mimicking USA--cardiac eval showed stable CAD compared to 2009.  Marland Kitchen Hyperlipidemia   . Hypertension   . Hyponatremia 10/03/2011   Na 131 on labs 02/2015 at [facility].  Baseline Na 132.  . Iron deficiency anemia 11/30/15   Hemoccults neg x 3 12/2015.  ?Mild malabsorption?  . Mixed stress and urge urinary incontinence   . RBBB with left anterior fascicular block    bifascicular block  . 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 Thrombocytopenia ([place]) 02/2013   Plts 114K  . Uterine cancer ([place]) 2000  . Xerostomia 2017/2018   Age related glandular atrophy + med effect (oxybutynin and gabapentin)    Past Surgical History:  Procedure Laterality Date  . ABDOMINAL HYSTERECTOMY  2000  . APPENDECTOMY  2000  . CARDIAC CATHETERIZATION N/A 06/22/2015   Stable single vessel CAD, EF normal.  Procedure: Left Heart Cath and Coronary Angiography;  Surgeon: Leonie Man, MD;  Location: [place] CV LAB;  Service: Cardiovascular;  Laterality: N/A;  . CATARACT EXTRACTION W/ INTRAOCULAR LENS  IMPLANT, BILATERAL  1990's  . CESAREAN SECTION  1287; 8676  . COLONOSCOPY  04/2001   Neg; pt cancelled screening colon 06/2011  . DILATION AND CURETTAGE OF UTERUS    . HERNIA REPAIR     abdominal "twice"  . LEFT HEART CATHETERIZATION WITH CORONARY ANGIOGRAM N/A 10/04/2011   Procedure: LEFT HEART CATHETERIZATION WITH CORONARY ANGIOGRAM;  Surgeon: Jacolyn Reedy, MD;  [facility] C[facility] LAB;  Service: Cardiovascular;  Laterality: N/A;  . REPAIR KNEE LIGAMENT  ~ 2008   left  . TONSILLECTOMY     as a child  . TRANSTHORACIC ECHOCARDIOGRAM  05/2013   EF 60-65%, trivial aortic regurg, no wall motion abnorm    Outpatient Medications Prior to Visit  Medication Sig Dispense Refill  . acetaminophen (TYLENOL) 325 MG tablet Take 325 mg by mouth every 6  (six) hours as needed.    Marland Kitchen amLODipine (NORVASC) 5 MG tablet TAKE 1 TABLET BY MOUTH DAILY 90 tablet 1  . aspirin 325 MG tablet Take 1 tablet (325 mg total) by mouth daily.    . Cholecalciferol (VITAMIN D3) 1000 UNITS CAPS Take 1,000 Units by mouth daily.     Marland Kitchen co-enzyme Q-10 30 MG capsule Take 30 mg by mouth daily. Reported on 11/29/2015    . fluticasone (FLONASE) 50 MCG/ACT nasal spray Place 2 sprays into both nostrils daily. 16 g 6  . gabapentin (NEURONTIN) 100 MG capsule 1 tab po qAM, 1 tab po q afternoon, and 2 tabs po qhs 120 capsule 2  . Ginkgo Biloba 120 MG CAPS Take 1 capsule by mouth 3 (three) times daily.    Marland Kitchen glucosamine-chondroitin 500-400 MG tablet Take 1 tablet by mouth daily.    Marland Kitchen losartan (COZAAR) 50 MG tablet Take 0.5 tablets (25 mg total) by mouth 2 (two) times daily. 90 tablet 1  . nitroGLYCERIN (NITROSTAT) 0.4 MG SL tablet Place 1 tablet (0.4 mg total) under the tongue every 5 (five) minutes as needed for chest pain. 25 tablet 12  . Omega-3 Fatty Acids (FISH OIL) 1000 MG CAPS Take 1 capsule by mouth daily.    . pantoprazole (PROTONIX) 40 MG tablet Take 1 tablet (40 mg total) by mouth daily.    . pilocarpine (SALAGEN) 5 MG tablet 1 tab twice daily to treat dry mouth 60 tablet 4  . polyethylene glycol powder (GLYCOLAX/MIRALAX) powder TAKE 17 G BY MOUTH 2 (TWO) TIMES DAILY. 527 g 5  . simvastatin (ZOCOR) 40 MG tablet Take 1 tablet (40 mg total) by mouth daily. 90 tablet 1  . trimethoprim (TRIMPEX) 100 MG tablet TAKE 1 TABLET (100 MG TOTAL) BY MOUTH DAILY. 90 tablet 3  . oxybutynin (DITROPAN) 5 MG tablet Take 2 tablets by mouth daily.  9   Facility-Administered Medications Prior to Visit  Medication Dose Route Frequency Provider Last Rate Last Dose  . acetaminophen (TYLENOL) tablet 650 mg  650 mg Oral Q4H PRN Jacolyn Reedy, MD      . omega-3 acid ethyl esters (LOVAZA) capsule 2 g  2 g Oral BID Jacolyn Reedy, MD      . ondansetron [facility]) 4 mg in sodium chloride 0.9 % 50  mL IVPB  4 mg Intravenous Q6H PRN Jacolyn Reedy, MD        Allergies  Allergen Reactions  . Macrobid [Nitrofurantoin Macrocrystal] Other (See Comments)    "I had chills & fever"    ROS As per HPI  PE: Blood pressure (!) 160/71, pulse (!) 58, temperature 97.9 F (36.6 C), temperature source Oral, resp. rate  16, height 5\' 4"  (1.626 m), weight 120 lb 4 oz (54.5 kg), SpO2 100 %. Gen: Alert, well appearing.  Patient is oriented to person, place, time, and situation. AFFECT: pleasant, lucid thought and speech. Mouth: lips w/out peeling or swelling.  Mouth with normal appearance of mucosa regarding moisture.  No focal lesions.  No submandibular gland enlargement or parotid gland enlargement. Face: mild violaceous and light brown ecchymoses in upper face region.  No swelling.  EOMI.  No lac. No further exam today.  LABS:    Chemistry      Component Value Date/Time   NA 129 (L) 11/23/2016 1601   K 5.2 (H) 11/23/2016 1601   CL 96 11/23/2016 1601   CO2 26 11/23/2016 1601   BUN 23 11/23/2016 1601   CREATININE 0.95 11/23/2016 1601   CREATININE 0.85 07/17/2016 1645      Component Value Date/Time   CALCIUM 9.0 11/23/2016 1601   ALKPHOS 54 07/11/2016 1900   AST 25 07/11/2016 1900   ALT 17 07/11/2016 1900   BILITOT 0.7 07/11/2016 1900     Lab Results  Component Value Date   HGBA1C 5.8 (H) 05/30/2013   POC glucose today: 109 (she last ate 4 hours ago).  IMPRESSION AND PLAN:  1) Facial contusions: resolving appropriately.  2) Xerostomia: improved off of oxybutynin.   Vesicare 5mg  qd--trial in place of oxybutynin to see if less dry mouth effect yet adequately treat her OAB sx's. Sjogren's labs neg in the past (05/2016).  3) OAB/urge incontinence: see #2 above.  4) IFG: random glucose today was 109.  Reassured pt--continue dietary changes that she has made.  An After Visit Summary was printed and given to the patient.  FOLLOW UP: Return for keep 06/29/17 appt already set  up.  Signed:  Crissie Sickles, MD           04/05/2017

## 2017-04-06 ENCOUNTER — Telehealth: Payer: Self-pay | Admitting: *Deleted

## 2017-04-06 NOTE — Telephone Encounter (Signed)
Pls see if we can get PA for vesicare.-thx

## 2017-04-06 NOTE — Telephone Encounter (Signed)
Pt LMOM on 04/06/17 at 8:55am asking for a call back.  SW pt and she stated that she can not afford the Vesicare. She stated that she will have to pay $200 out of pocket. She stated that she is going to take the oxybutynin because she is having to go to the bathroom to often. She would like to see if something else could be sent in or if a PA could be done to get Vesicare covered. Please advise. Thanks.

## 2017-04-09 NOTE — Telephone Encounter (Signed)
Patient had called and was informed about Vesicare. She stated that she would just continue her Oxybutnin. Dr. Anitra Lauth notified.

## 2017-04-09 NOTE — Telephone Encounter (Signed)
She can either pay the cash for a trial of vesicare or continue using the oxybutinyn. I would recommend just continuing the oxybutynin since we know it helps her.

## 2017-04-09 NOTE — Telephone Encounter (Signed)
Fax received from CoverMyMeds stating that Prior authorization is not needed. Patients cost is $270 with insurance coverage.

## 2017-04-19 ENCOUNTER — Other Ambulatory Visit: Payer: Self-pay | Admitting: Family Medicine

## 2017-04-30 ENCOUNTER — Ambulatory Visit (INDEPENDENT_AMBULATORY_CARE_PROVIDER_SITE_OTHER): Payer: Medicare Other | Admitting: Family Medicine

## 2017-04-30 ENCOUNTER — Encounter: Payer: Self-pay | Admitting: Family Medicine

## 2017-04-30 ENCOUNTER — Other Ambulatory Visit: Payer: Self-pay

## 2017-04-30 VITALS — BP 135/68 | HR 63 | Temp 97.9°F | Resp 16 | Ht 64.0 in | Wt 119.5 lb

## 2017-04-30 DIAGNOSIS — N3001 Acute cystitis with hematuria: Secondary | ICD-10-CM

## 2017-04-30 DIAGNOSIS — N390 Urinary tract infection, site not specified: Secondary | ICD-10-CM

## 2017-04-30 DIAGNOSIS — R300 Dysuria: Secondary | ICD-10-CM | POA: Diagnosis not present

## 2017-04-30 LAB — POCT URINALYSIS DIPSTICK
Bilirubin, UA: NEGATIVE
Glucose, UA: NEGATIVE
Ketones, UA: NEGATIVE
Nitrite, UA: NEGATIVE
PROTEIN UA: 30
Spec Grav, UA: 1.015 (ref 1.010–1.025)
UROBILINOGEN UA: 0.2 U/dL
pH, UA: 7 (ref 5.0–8.0)

## 2017-04-30 MED ORDER — OXYBUTYNIN CHLORIDE 5 MG PO TABS: 5.0000 mg | ORAL_TABLET | Freq: Three times a day (TID) | ORAL | 11 refills | Status: DC

## 2017-04-30 MED ORDER — CIPROFLOXACIN HCL 500 MG PO TABS: 500.0000 mg | ORAL_TABLET | Freq: Two times a day (BID) | ORAL | 0 refills | Status: AC

## 2017-04-30 NOTE — Progress Notes (Signed)
OFFICE VISIT  04/30/2017   CC:  Chief Complaint  Patient presents with  . Urinary Tract Infection    dysuria   HPI:    Patient is a 83 y.o. Caucasian female who presents for urinary complaints, primarily painful urination. Hurting in urethral area when urinating for the last couple days.  No fever or malaise or flank pain. No change in her usual baseline urinary urge incontinence.  No otc AZO has been used.  Past Medical History:  Diagnosis Date  . Abnormal finding on thyroid function test    per old records: T3 low, free T4 elevated, TSH normal.  Armour thyroid made her feel worse and T4 went up so med d/c'd and liothyronine 5 mcg tried but also made pt feel worse.  Endo (Dr. Loanne Drilling felt like Biotin was causing the abnormal TFT's).  Repeat TFTs OFF BIOTIN x 2 wks were NORMAL.  NO FURTHER THYROID TESTING NEEDED.  Marland Kitchen Anginal pain ([place])   . Arthritis    "think it's osteo; got some in my hands"  . Bifascicular block   . Bladder infection, chronic 10/03/11   "have had one for over 1 yr"--has been on trimethaprim 100 mg qd since 2014  . CAD (coronary artery disease)    Cath 2009  Occluded LAD, 40-50% RCA and circ managed medically.  Repeat 06/22/15 no change.  . Chronic constipation    slow transit.  Barium enema to eval for colonic stricture 10/2014 was NORMAL  . Cognitive dysfunction    short term memory loss--takes ginko biloba  . COPD (chronic obstructive pulmonary disease) ([place])    Changes noted on CXR 2017  . GERD (gastroesophageal reflux disease)    (also LPR) Schatzki's ring, s/p dil '97; food impaction 08/2010, on PPI therapy since w/out further sx so no dil performed  . History of herpes zoster 06/2015   right side of chest; presented with chest pain mimicking USA--cardiac eval showed stable CAD compared to 2009.  Marland Kitchen Hyperlipidemia   . Hypertension   . Hyponatremia 10/03/2011   Na 131 on labs 02/2015 at [facility].  Baseline Na 132.  . Iron deficiency anemia 11/30/15   Hemoccults neg x 3 12/2015.  ?Mild malabsorption?  . Mixed stress and urge urinary incontinence   . RBBB with left anterior fascicular block    bifascicular block  . 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 Thrombocytopenia ([place]) 02/2013   Plts 114K  . Uterine cancer ([place]) 2000  . Xerostomia 2017/2018   Age related glandular atrophy + med effect (oxybutynin and gabapentin)    Past Surgical History:  Procedure Laterality Date  . ABDOMINAL HYSTERECTOMY  2000  . APPENDECTOMY  2000  . CARDIAC CATHETERIZATION N/A 06/22/2015   Stable single vessel CAD, EF normal.  Procedure: Left Heart Cath and Coronary Angiography;  Surgeon: Leonie Man, MD;  Location: [facility];  Service: Cardiovascular;  Laterality: N/A;  . CATARACT EXTRACTION W/ INTRAOCULAR LENS  IMPLANT, BILATERAL  1990's  . [place]; 1960  . COLONOSCOPY  04/2001   Neg; pt cancelled screening colon 06/2011  . DILATION AND CURETTAGE OF UTERUS    . HERNIA REPAIR     abdominal "twice"  . LEFT HEART CATHETERIZATION WITH CORONARY ANGIOGRAM N/A 10/04/2011   Procedure: LEFT HEART CATHETERIZATION WITH CORONARY ANGIOGRAM;  Surgeon: Jacolyn Reedy, MD;  Location: [facility];  Service: Cardiovascular;  Laterality: N/A;  . REPAIR KNEE LIGAMENT  ~ 2008   left  . TONSILLECTOMY     as a child  . TRANSTHORACIC ECHOCARDIOGRAM  05/2013   EF 60-65%, trivial aortic regurg, no wall motion abnorm    Outpatient Medications Prior to Visit  Medication Sig Dispense Refill  . acetaminophen (TYLENOL) 325 MG tablet Take 325 mg by mouth every 6 (six) hours as needed.    Marland Kitchen amLODipine (NORVASC) 5 MG tablet TAKE 1 TABLET BY MOUTH DAILY 90 tablet 1  . aspirin 325 MG tablet Take 1 tablet (325 mg total) by mouth daily.    . Cholecalciferol (VITAMIN D3) 1000 UNITS CAPS Take 1,000 Units by mouth daily.     Marland Kitchen co-enzyme Q-10 30 MG  capsule Take 30 mg by mouth daily. Reported on 11/29/2015    . fluticasone (FLONASE) 50 MCG/ACT nasal spray Place 2 sprays into both nostrils daily. 16 g 6  . gabapentin (NEURONTIN) 100 MG capsule 1 tab po qAM, 1 tab po q afternoon, and 2 tabs po qhs 120 capsule 2  . Ginkgo Biloba 120 MG CAPS Take 1 capsule by mouth 3 (three) times daily.    Marland Kitchen glucosamine-chondroitin 500-400 MG tablet Take 1 tablet by mouth daily.    Marland Kitchen losartan (COZAAR) 50 MG tablet Take 0.5 tablets (25 mg total) by mouth 2 (two) times daily. 90 tablet 1  . nitroGLYCERIN (NITROSTAT) 0.4 MG SL tablet Place 1 tablet (0.4 mg total) under the tongue every 5 (five) minutes as needed for chest pain. 25 tablet 12  . Omega-3 Fatty Acids (FISH OIL) 1000 MG CAPS Take 1 capsule by mouth daily.    . pantoprazole (PROTONIX) 40 MG tablet Take 1 tablet (40 mg total) by mouth daily.    . pilocarpine (SALAGEN) 5 MG tablet 1 tab twice daily to treat dry mouth 60 tablet 4  . polyethylene glycol powder (GLYCOLAX/MIRALAX) powder TAKE 17 G BY MOUTH 2 (TWO) TIMES DAILY. 527 g 5  . simvastatin (ZOCOR) 40 MG tablet TAKE 1 TABLET BY MOUTH EVERY DAY 90 tablet 1  . trimethoprim (TRIMPEX) 100 MG tablet TAKE 1 TABLET (100 MG TOTAL) BY MOUTH DAILY. 90 tablet 3  . solifenacin (VESICARE) 5 MG tablet Take 1 tablet (5 mg total) by mouth daily. (Patient not taking: Reported on 04/30/2017) 30 tablet 6   No facility-administered medications prior to visit.     Allergies  Allergen Reactions  . Macrobid [Nitrofurantoin Macrocrystal] Other (See Comments)    "I had chills & fever"    ROS As per HPI  PE: Blood pressure 135/68, pulse 63, temperature 97.9 F (36.6 C), temperature source Oral, resp. rate 16, height 5\' 4"  (1.626 m), weight 119 lb 8 oz (54.2 kg), SpO2 99 %. Gen: Alert, well appearing.  Patient is oriented to person, place, time, and situation. AFFECT: pleasant, lucid thought and speech. No further exam today.  LABS:    Chemistry      Component  Value Date/Time   NA 129 (L) 11/23/2016 1601   K 5.2 (H) 11/23/2016 1601   CL 96 11/23/2016 1601   CO2 26 11/23/2016 1601   BUN 23 11/23/2016 1601   CREATININE 0.95 11/23/2016 1601   CREATININE 0.85 07/17/2016 1645      Component Value Date/Time   CALCIUM 9.0 11/23/2016 1601   ALKPHOS 54 07/11/2016 1900   AST 25 07/11/2016 1900   ALT 17 07/11/2016 1900   BILITOT 0.7  07/11/2016 1900     CC UA today: small blood, nitrite neg, LEU large.  IMPRESSION AND PLAN:  1) Acute cystitis in pt with recurrent UTI: Cipro 500 mg bid x 5d.   Urine sent for c/s: if returns with bug resistant to trimethoprim again then we'll switch her daily prophylactic med (most likely to low dose cipro).  2) Urinary urge incontinence/OAB: she could not afford vesicare. She is back on oxybutynin at 5mg  bid dosing.  Will increase this to 5mg  tid dosing: new rx with new sig sent in today.  An After Visit Summary was printed and given to the patient.  FOLLOW UP: Return if symptoms worsen or fail to improve.  Signed:  Crissie Sickles, MD           04/30/2017

## 2017-05-03 LAB — URINE CULTURE
MICRO NUMBER: 81327087
SPECIMEN QUALITY:: ADEQUATE

## 2017-05-04 ENCOUNTER — Encounter: Payer: Self-pay | Admitting: Family Medicine

## 2017-05-04 ENCOUNTER — Other Ambulatory Visit: Payer: Self-pay | Admitting: Family Medicine

## 2017-05-04 MED ORDER — AMOXICILLIN 500 MG PO CAPS: 500.0000 mg | ORAL_CAPSULE | Freq: Two times a day (BID) | ORAL | 0 refills | Status: DC

## 2017-05-10 ENCOUNTER — Other Ambulatory Visit: Payer: Self-pay

## 2017-06-05 DIAGNOSIS — E875 Hyperkalemia: Secondary | ICD-10-CM

## 2017-06-05 HISTORY — DX: Hyperkalemia: E87.5

## 2017-06-06 ENCOUNTER — Ambulatory Visit (INDEPENDENT_AMBULATORY_CARE_PROVIDER_SITE_OTHER): Payer: Medicare Other | Admitting: Family Medicine

## 2017-06-06 ENCOUNTER — Encounter: Payer: Self-pay | Admitting: Family Medicine

## 2017-06-06 VITALS — BP 158/69 | HR 59 | Temp 98.3°F | Resp 16 | Wt 120.0 lb

## 2017-06-06 DIAGNOSIS — R768 Other specified abnormal immunological findings in serum: Secondary | ICD-10-CM | POA: Diagnosis not present

## 2017-06-06 DIAGNOSIS — M3500 Sicca syndrome, unspecified: Secondary | ICD-10-CM

## 2017-06-06 MED ORDER — CEVIMELINE HCL 30 MG PO CAPS: ORAL_CAPSULE | ORAL | 1 refills | Status: DC

## 2017-06-06 NOTE — Progress Notes (Signed)
OFFICE VISIT  06/06/2017  CC:  Chief Complaint  Patient presents with  . Follow-up    dry mouth   HPI:    Patient is a 84 y.o. Caucasian female who presents for chronic dry mouth. Has cut back on her ditropan to 1 qd. Dry mouth only slightly improved, mainly in lips.   Urine urgency/OAB sx's same on 1 tab qd.    Last lab w/u for sjogrens was neg except mildly elevated ANA (1:160).  Past Medical History:  Diagnosis Date  . Abnormal finding on thyroid function test    per old records: T3 low, free T4 elevated, TSH normal.  [place]our thyroid made her feel worse and T4 went up so med d/c'd and liothyronine 5 mcg tried but also made pt feel worse.  Endo (Dr. Loanne Drilling felt like Biotin was causing the abnormal TFT's).  Repeat TFTs OFF BIOTIN x 2 wks were NORMAL.  NO FURTHER THYROID TESTING NEEDED.  Marland Kitchen Anginal pain ([place])   . Arthritis    "think it's osteo; got some in my hands"  . Bifascicular block   . Bladder infection, chronic 10/03/11   "have had one for over 1 yr"--has been on trimethaprim 100 mg qd since 2014  . CAD (coronary artery disease)    Cath 2009  Occluded LAD, 40-50% RCA and circ managed medically.  Repeat 06/22/15 no change.  . Chronic constipation    slow transit.  Barium enema to eval for colonic stricture 10/2014 was NORMAL  . Cognitive dysfunction    short term memory loss--takes ginko biloba  . COPD (chronic obstructive pulmonary disease) ([place])    Changes noted on CXR 2017  . GERD (gastroesophageal reflux disease)    (also LPR) Schatzki's ring, s/p dil '97; food impaction 08/2010, on PPI therapy since w/out further sx so no dil performed  . History of herpes zoster 06/2015   right side of chest; presented with chest pain mimicking [place]--cardiac eval showed stable CAD compared to 2009.  Marland Kitchen Hyperlipidemia   . Hypertension   . Hyponatremia 10/03/2011   Na 131 on labs 02/2015 at [facility].  Baseline Na 132.  . Iron deficiency anemia 11/30/15   Hemoccults neg x 3 12/2015.  ?Mild  malabsorption?  . Mixed stress and urge urinary incontinence   . RBBB with left anterior fascicular block    bifascicular block  . Recurrent UTI    Trimethaprim 100 mg qd --prophylaxis  . 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 Thrombocytopenia ([place]) 02/2013   Plts 114K  . Uterine cancer ([place]) 2000  . Xerostomia 2017/2018   Age related glandular atrophy + med effect (oxybutynin and gabapentin)    Past Surgical History:  Procedure Laterality Date  . ABDOMINAL HYSTERECTOMY  2000  . APPENDECTOMY  2000  . CARDIAC CATHETERIZATION N/A 06/22/2015   Stable single vessel CAD, EF normal.  Procedure: Left Heart Cath and Coronary Angiography;  Surgeon: Leonie Man, MD;  Location: [facility];  Service: Cardiovascular;  Laterality: N/A;  . CATARACT EXTRACTION W/ INTRAOCULAR LENS  IMPLANT, BILATERAL  1990's  . [place]; 1960  . COLONOSCOPY  04/2001   Neg; pt cancelled screening colon 06/2011  . DILATION AND CURETTAGE OF UTERUS    . HERNIA REPAIR     abdominal "twice"  . LEFT HEART CATHETERIZATION WITH CORONARY ANGIOGRAM N/A 10/04/2011  Procedure: LEFT HEART CATHETERIZATION WITH CORONARY ANGIOGRAM;  Surgeon: Jacolyn Reedy, MD;  Location: [facility] C[facility];  Service: Cardiovascular;  Laterality: N/A;  . REPAIR KNEE LIGAMENT  ~ 2008   left  . TONSILLECTOMY     as a child  . TRANSTHORACIC ECHOCARDIOGRAM  05/2013   EF 60-65%, trivial aortic regurg, no wall motion abnorm    Outpatient Medications Prior to Visit  Medication Sig Dispense Refill  . acetaminophen (TYLENOL) 325 MG tablet Take 325 mg by mouth every 6 (six) hours as needed.    Marland Kitchen amLODipine (NORVASC) 5 MG tablet TAKE 1 TABLET BY MOUTH DAILY 90 tablet 1  . aspirin 325 MG tablet Take 1 tablet (325 mg total) by mouth daily.    . Cholecalciferol (VITAMIN D3) 1000 UNITS CAPS Take 1,000 Units by mouth daily.     Marland Kitchen  co-enzyme Q-10 30 MG capsule Take 30 mg by mouth daily. Reported on 11/29/2015    . fluticasone (FLONASE) 50 MCG/ACT nasal spray Place 2 sprays into both nostrils daily. 16 g 6  . gabapentin (NEURONTIN) 100 MG capsule 1 tab po qAM, 1 tab po q afternoon, and 2 tabs po qhs 120 capsule 2  . Ginkgo Biloba 120 MG CAPS Take 1 capsule by mouth 3 (three) times daily.    Marland Kitchen glucosamine-chondroitin 500-400 MG tablet Take 1 tablet by mouth daily.    Marland Kitchen losartan (COZAAR) 50 MG tablet Take 0.5 tablets (25 mg total) by mouth 2 (two) times daily. 90 tablet 1  . nitroGLYCERIN (NITROSTAT) 0.4 MG SL tablet Place 1 tablet (0.4 mg total) under the tongue every 5 (five) minutes as needed for chest pain. 25 tablet 12  . Omega-3 Fatty Acids (FISH OIL) 1000 MG CAPS Take 1 capsule by mouth daily.    Marland Kitchen oxybutynin (DITROPAN) 5 MG tablet Take 1 tablet (5 mg total) by mouth 3 (three) times daily. (Patient taking differently: Take 5 mg by mouth daily. ) 90 tablet 11  . pantoprazole (PROTONIX) 40 MG tablet Take 1 tablet (40 mg total) by mouth daily.    . polyethylene glycol powder (GLYCOLAX/MIRALAX) powder TAKE 17 G BY MOUTH 2 (TWO) TIMES DAILY. 527 g 5  . simvastatin (ZOCOR) 40 MG tablet TAKE 1 TABLET BY MOUTH EVERY DAY 90 tablet 1  . trimethoprim (TRIMPEX) 100 MG tablet TAKE 1 TABLET (100 MG TOTAL) BY MOUTH DAILY. 90 tablet 3  . pilocarpine (SALAGEN) 5 MG tablet 1 tab twice daily to treat dry mouth 60 tablet 4  . amoxicillin (AMOXIL) 500 MG capsule Take 1 capsule (500 mg total) by mouth 2 (two) times daily. (Patient not taking: Reported on 06/06/2017) 10 capsule 0   No facility-administered medications prior to visit.     Allergies  Allergen Reactions  . Macrobid [Nitrofurantoin Macrocrystal] Other (See Comments)    "I had chills & fever"    ROS As per HPI  PE: Blood pressure (!) 158/69, pulse (!) 59, temperature 98.3 F (36.8 C), temperature source Oral, resp. rate 16, weight 120 lb (54.4 kg), SpO2 97 %. Gen: Alert,  well appearing.  Patient is oriented to person, place, time, and situation. AFFECT: pleasant, lucid thought and speech. Lips and oral mucosa with mild dryness. CV: RRR, 1-2/6 syst murmur at base.  No rub/gallop. Chest is clear, no wheezing or rales. Normal symmetric air entry throughout both lung fields. No chest wall deformities or tenderness. EXT: no clubbing, cyanosis, or edema.    LABS:     Chemistry  Component Value Date/Time   NA 129 (L) 11/23/2016 1601   K 5.2 (H) 11/23/2016 1601   CL 96 11/23/2016 1601   CO2 26 11/23/2016 1601   BUN 23 11/23/2016 1601   CREATININE 0.95 11/23/2016 1601   CREATININE 0.85 07/17/2016 1645      Component Value Date/Time   CALCIUM 9.0 11/23/2016 1601   ALKPHOS 54 07/11/2016 1900   AST 25 07/11/2016 1900   ALT 17 07/11/2016 1900   BILITOT 0.7 07/11/2016 1900     Lab Results  Component Value Date   WBC 8.7 07/11/2016   HGB 12.5 07/11/2016   HCT 36.2 07/11/2016   MCV 87.9 07/11/2016   PLT 146 (L) 07/11/2016   Lab Results  Component Value Date   IRON 86 11/29/2015   IRON 41 (L) 11/29/2015   TIBC 322 11/29/2015   FERRITIN 22.3 12/28/2015   Lab Results  Component Value Date   VITAMINB12 680 02/24/2016    Lab Results  Component Value Date   TSH 1.55 11/29/2015   IMPRESSION AND PLAN:  1) Dry mouth; suspect anticholinergic effect from his oxybutynin. Slight improvement with decrease of dose to 5mg  qd---with no signif worsening of her OAB. Will repeat sjogren's labs and repeat ANA today. Other instructions discussed in depth today: Stop Pilocarpine.  Stop Ginkgo biloba.  Stop glucosamine/chondroitin.  Use saline nasal spray twice per day.  Buy a humidifier and put in the room you spend the most time in.  Suck on ice.  Suck on sugar free lozenges or hard candy or chew sugarless gum.  Start generic evoxac as instructed on the bottle. Therapeutic expectations and side effect profile of medication discussed today.   Patient's questions answered.  FOLLOW UP: Return in about 4 weeks (around 07/04/2017) for f/u dry mouth.  Signed:  Crissie Sickles, MD           06/06/2017

## 2017-06-06 NOTE — Patient Instructions (Signed)
Stop Pilocarpine.  Stop Ginkgo biloba.  Stop glucosamine/chondroitin.  Use saline nasal spray twice per day.  Buy a humidifier and put in the room you spend the most time in.  Suck on ice.  Suck on sugar free lozenges or hard candy or chew sugarless gum.  Start generic evoxac as instructed on the bottle.

## 2017-06-07 ENCOUNTER — Encounter: Payer: Self-pay | Admitting: Family Medicine

## 2017-06-07 LAB — ANTINUCLEAR ANTIBODIES, IFA: ANA Titer 1: NEGATIVE

## 2017-06-07 LAB — SJOGREN'S SYNDROME ANTIBODS(SSA + SSB)
SSA (RO) (ENA) ANTIBODY, IGG: NEGATIVE AI
SSB (LA) (ENA) ANTIBODY, IGG: NEGATIVE AI

## 2017-06-26 ENCOUNTER — Other Ambulatory Visit: Payer: Self-pay | Admitting: Family Medicine

## 2017-06-26 DIAGNOSIS — K219 Gastro-esophageal reflux disease without esophagitis: Secondary | ICD-10-CM

## 2017-06-26 MED ORDER — PANTOPRAZOLE SODIUM 40 MG PO TBEC: 40.0000 mg | DELAYED_RELEASE_TABLET | Freq: Every day | ORAL | 1 refills | Status: DC

## 2017-06-28 DIAGNOSIS — I452 Bifascicular block: Secondary | ICD-10-CM | POA: Diagnosis not present

## 2017-06-28 DIAGNOSIS — I25119 Atherosclerotic heart disease of native coronary artery with unspecified angina pectoris: Secondary | ICD-10-CM | POA: Diagnosis not present

## 2017-06-28 DIAGNOSIS — E7849 Other hyperlipidemia: Secondary | ICD-10-CM | POA: Diagnosis not present

## 2017-06-28 DIAGNOSIS — I10 Essential (primary) hypertension: Secondary | ICD-10-CM | POA: Diagnosis not present

## 2017-06-29 ENCOUNTER — Ambulatory Visit (INDEPENDENT_AMBULATORY_CARE_PROVIDER_SITE_OTHER): Payer: Medicare Other

## 2017-06-29 ENCOUNTER — Encounter: Payer: Self-pay | Admitting: Family Medicine

## 2017-06-29 ENCOUNTER — Ambulatory Visit (INDEPENDENT_AMBULATORY_CARE_PROVIDER_SITE_OTHER): Payer: Medicare Other | Admitting: Family Medicine

## 2017-06-29 VITALS — BP 134/76 | HR 63 | Temp 97.5°F | Wt 122.0 lb

## 2017-06-29 VITALS — BP 134/76 | HR 63 | Temp 97.5°F | Ht 64.0 in | Wt 122.0 lb

## 2017-06-29 DIAGNOSIS — I10 Essential (primary) hypertension: Secondary | ICD-10-CM

## 2017-06-29 DIAGNOSIS — R682 Dry mouth, unspecified: Secondary | ICD-10-CM | POA: Diagnosis not present

## 2017-06-29 DIAGNOSIS — Z0000 Encounter for general adult medical examination without abnormal findings: Secondary | ICD-10-CM | POA: Diagnosis not present

## 2017-06-29 DIAGNOSIS — Z23 Encounter for immunization: Secondary | ICD-10-CM | POA: Diagnosis not present

## 2017-06-29 DIAGNOSIS — E7800 Pure hypercholesterolemia, unspecified: Secondary | ICD-10-CM | POA: Diagnosis not present

## 2017-06-29 DIAGNOSIS — K117 Disturbances of salivary secretion: Secondary | ICD-10-CM

## 2017-06-29 MED ORDER — ZOSTER VAC RECOMB ADJUVANTED 50 MCG/0.5ML IM SUSR: 0.5000 mL | Freq: Once | INTRAMUSCULAR | 1 refills | Status: AC

## 2017-06-29 MED ORDER — LOSARTAN POTASSIUM 50 MG PO TABS: 50.0000 mg | ORAL_TABLET | Freq: Two times a day (BID) | ORAL | 1 refills | Status: DC

## 2017-06-29 NOTE — Progress Notes (Signed)
Subjective:   Sabrina Alvarez is a 84 y.o. female who presents for an Initial Medicare Annual Wellness Visit.  Review of Systems    No ROS.  Medicare Wellness Visit. Additional risk factors are reflected in the social history.  Cardiac Risk Factors include: advanced age (>20men, >64 women);dyslipidemia;hypertension;sedentary lifestyle   Sleep patterns: Sleeps 7-8 hours. Up to void x 3.  Home Safety/Smoke Alarms: Feels safe in home. Smoke alarms in place.  Living environment; residence and Firearm Safety: Lives alone in 1 story home with basement. Rail in place.  Seat Belt Safety/Bike Helmet: Wears seat belt.   Female:   Pap-NA      Mammo-Declines       Dexa scan-Declines       CCS-Last 2003     Objective:    There were no vitals filed for this visit. There is no height or weight on file to calculate BMI.  Advanced Directives 06/29/2017 07/11/2016 06/22/2015 05/29/2013 10/03/2011  Does Patient Have a Medical Advance Directive? Yes Yes Yes Patient has advance directive, copy not in chart Patient has advance directive, copy not in chart  Type of Advance Directive [place];Living will Living will [place];Living will [place];Living will [place];Living will  Does patient want to make changes to medical advance directive? - - No - Patient declined No -  Copy of [place] in Chart? No - copy requested - Yes Copy requested from family Copy requested from other (Comment)    Current Medications (verified) Outpatient Encounter Medications as of 06/29/2017  Medication Sig  . acetaminophen (TYLENOL) 325 MG tablet Take 325 mg by mouth every 6 (six) hours as needed.  Marland Kitchen amLODipine (NORVASC) 5 MG tablet TAKE 1 TABLET BY MOUTH DAILY  . aspirin 325 MG tablet Take 1 tablet (325 mg total) by mouth daily.  . cevimeline (EVOXAC) 30 MG capsule 1 cap tid for treatment of chronic dry mouth  .  Cholecalciferol (VITAMIN D3) 1000 UNITS CAPS Take 1,000 Units by mouth daily.   Marland Kitchen co-enzyme Q-10 30 MG capsule Take 30 mg by mouth daily. Reported on 11/29/2015  . fluticasone (FLONASE) 50 MCG/ACT nasal spray Place 2 sprays into both nostrils daily. (Patient not taking: Reported on 06/29/2017)  . gabapentin (NEURONTIN) 100 MG capsule 1 tab po qAM, 1 tab po q afternoon, and 2 tabs po qhs  . glucosamine-chondroitin 500-400 MG tablet Take 1 tablet by mouth daily.  . nitroGLYCERIN (NITROSTAT) 0.4 MG SL tablet Place 1 tablet (0.4 mg total) under the tongue every 5 (five) minutes as needed for chest pain. (Patient not taking: Reported on 06/29/2017)  . Omega-3 Fatty Acids (FISH OIL) 1000 MG CAPS Take 1 capsule by mouth daily.  Marland Kitchen oxybutynin (DITROPAN) 5 MG tablet Take 1 tablet (5 mg total) by mouth 3 (three) times daily. (Patient taking differently: Take 5 mg by mouth daily. One tablet twice a day)  . pantoprazole (PROTONIX) 40 MG tablet Take 1 tablet (40 mg total) by mouth daily.  . polyethylene glycol powder (GLYCOLAX/MIRALAX) powder TAKE 17 G BY MOUTH 2 (TWO) TIMES DAILY.  . simvastatin (ZOCOR) 40 MG tablet TAKE 1 TABLET BY MOUTH EVERY DAY  . trimethoprim (TRIMPEX) 100 MG tablet TAKE 1 TABLET (100 MG TOTAL) BY MOUTH DAILY.  Marland Kitchen Zoster Vaccine Adjuvanted [facility]) injection Inject 0.5 mLs into the muscle once for 1 dose.  . [DISCONTINUED] amoxicillin (AMOXIL) 500 MG capsule Take 1 capsule (500 mg  total) by mouth 2 (two) times daily. (Patient not taking: Reported on 06/06/2017)  . [DISCONTINUED] Ginkgo Biloba 120 MG CAPS Take 1 capsule by mouth 3 (three) times daily.  . [DISCONTINUED] losartan (COZAAR) 50 MG tablet Take 0.5 tablets (25 mg total) by mouth 2 (two) times daily. (Patient taking differently: Take 25 mg by mouth 2 (two) times daily. One tablet twice a day)   No facility-administered encounter medications on file as of 06/29/2017.     Allergies (verified) Macrobid [nitrofurantoin macrocrystal]    History: Past Medical History:  Diagnosis Date  . Abnormal finding on thyroid function test    per old records: T3 low, free T4 elevated, TSH normal.  [place] thyroid made her feel worse and T4 went up so med d/c'd and liothyronine 5 mcg tried but also made pt feel worse.  Endo (Dr. Loanne Drilling felt like Biotin was causing the abnormal TFT's).  Repeat TFTs OFF BIOTIN x 2 wks were NORMAL.  NO FURTHER THYROID TESTING NEEDED.  Marland Kitchen Anginal pain ([place])   . Arthritis    "think it's osteo; got some in my hands"  . Bifascicular block   . Bladder infection, chronic 10/03/11   "have had one for over 1 yr"--has been on trimethaprim 100 mg qd since 2014  . CAD (coronary artery disease)    Cath 2009  Occluded LAD, 40-50% RCA and circ managed medically.  Repeat 06/22/15 no change.  . Chronic constipation    slow transit.  Barium enema to eval for colonic stricture 10/2014 was NORMAL  . Cognitive dysfunction    short term memory loss--takes ginko biloba  . COPD (chronic obstructive pulmonary disease) ([place])    Changes noted on CXR 2017  . GERD (gastroesophageal reflux disease)    (also LPR) Schatzki's ring, s/p dil '97; food impaction 08/2010, on PPI therapy since w/out further sx so no dil performed  . History of herpes zoster 06/2015   right side of chest; presented with chest pain mimicking [place]--cardiac eval showed stable CAD compared to 2009.  Marland Kitchen Hyperlipidemia   . Hypertension   . Hyponatremia 10/03/2011   Na 131 on labs 02/2015 at [facility].  Baseline Na 132.  . Iron deficiency anemia 11/30/15   Hemoccults neg x 3 12/2015.  ?Mild malabsorption?  . Mixed stress and urge urinary incontinence   . RBBB with left anterior fascicular block    bifascicular block  . Recurrent UTI    Trimethaprim 100 mg qd --prophylaxis  . 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 Thrombocytopenia ([place]) 02/2013   Plts 114K   . Uterine cancer ([place]) 2000  . Xerostomia 2017/2018   Age related glandular atrophy + med effect (oxybutynin and gabapentin).  ANA and sjogren's panel NEG 06/2017.   Past Surgical History:  Procedure Laterality Date  . ABDOMINAL HYSTERECTOMY  2000  . APPENDECTOMY  2000  . CARDIAC CATHETERIZATION N/A 06/22/2015   Stable single vessel CAD, EF normal.  Procedure: Left Heart Cath and Coronary Angiography;  Surgeon: Leonie Man, MD;  Location: [place] [facility];  Service: Cardiovascular;  Laterality: N/A;  . CATARACT EXTRACTION W/ INTRAOCULAR LENS  IMPLANT, BILATERAL  1990's  . [place]; 1960  . COLONOSCOPY  04/2001   Neg; pt cancelled screening colon 06/2011  . DILATION AND CURETTAGE OF UTERUS    . HERNIA REPAIR  abdominal "twice"  . LEFT HEART CATHETERIZATION WITH CORONARY ANGIOGRAM N/A 10/04/2011   Procedure: LEFT HEART CATHETERIZATION WITH CORONARY ANGIOGRAM;  Surgeon: Jacolyn Reedy, MD;  Location: [facility] CATH LAB;  Service: Cardiovascular;  Laterality: N/A;  . REPAIR KNEE LIGAMENT  ~ 2008   left  . TONSILLECTOMY     as a child  . TRANSTHORACIC ECHOCARDIOGRAM  05/2013   EF 60-65%, trivial aortic regurg, no wall motion abnorm   Family History  Problem Relation Age of Onset  . Arthritis Mother   . AAA (abdominal aortic aneurysm) Mother   . Diabetes Sister   . CVA Father   . Rheum arthritis Sister   . Cancer Neg Hx   . Heart disease Neg Hx   . Thyroid disease Neg Hx    Social History   Socioeconomic History  . Marital status: Widowed    Spouse name: Not on file  . Number of children: Not on file  . Years of education: Not on file  . Highest education level: Not on file  Social Needs  . Financial resource strain: Not on file  . Food insecurity - worry: Not on file  . Food insecurity - inability: Not on file  . Transportation needs - medical: Not on file  . Transportation needs - non-medical: Not on file  Occupational History  . Not on file  Tobacco  Use  . Smoking status: Never Smoker  . Smokeless tobacco: Never Used  Substance and Sexual Activity  . Alcohol use: No  . Drug use: No  . Sexual activity: Not on file  Other Topics Concern  . Not on file  Social History Narrative   Widow, lives alone.  Three sons, one deceased.   Orig from [facility].     Educ: HS.   Occup: retired.  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   No T/A/Ds.    Tobacco Counseling Counseling given: Not Answered   Activities of Daily Living In your present state of health, do you have any difficulty performing the following activities: 06/29/2017  Hearing? N  Vision? N  Difficulty concentrating or making decisions? Y  Walking or climbing stairs? N  Dressing or bathing? N  Doing errands, shopping? N  Preparing Food and eating ? N  Using the Toilet? N  In the past six months, have you accidently leaked urine? N  Do you have problems with loss of bowel control? N  Managing your Medications? N  Managing your Finances? N  Housekeeping or managing your Housekeeping? N  Some recent data might be hidden     Immunizations and Health Maintenance Immunization History  Administered Date(s) Administered  . Influenza, High Dose Seasonal PF 02/24/2016  . Influenza-Unspecified 04/15/2015, 02/20/2017  . Pneumococcal Conjugate-13 04/15/2015  . Pneumococcal Polysaccharide-23 07/20/2016   There are no preventive care reminders to display for this patient.  Patient Care Team: Tammi Sou, MD as PCP - General (Family Medicine) Jacolyn Reedy, MD as Consulting Physician (Cardiology) Lomax, Marny Lowenstein, MD (Inactive) as Consulting Physician (Gynecology) Ronald Lobo, MD as Consulting Physician (Gastroenterology) Bjorn Loser, MD as Consulting Physician (Urology) Sydnee Levans, MD as Referring Physician (Dermatology)  Indicate any recent Medical Services you may have received from other than [facility] providers  in the past year (date may be approximate).     Assessment:   This is a routine wellness examination for [facility].  Hearing/Vision screen Hearing Screening Comments: Hearing aids bilateral. Appt next week.  Vision  Screening Comments: Last exam < 1 year. [facility] in [place]. Wears glasses. Pt reports swollen Cornea. Appt scheduled soon.   Dietary issues and exercise activities discussed: Current Exercise Habits: The patient does not participate in regular exercise at present, Exercise limited by: None identified   Diet (meal preparation, eat out, water intake, caffeinated beverages, dairy products, fruits and vegetables): Drinks water.   Breakfast: cereal, oatmeal Lunch: fish, sandwich, pb Dinner: leftovers; vegetables      Goals    . Patient Stated     Maintain current health.      Depression Screen PHQ 2/9 Scores 06/29/2017 06/06/2017 02/24/2016  PHQ - 2 Score 0 0 0    Fall Risk Fall Risk  06/29/2017 06/06/2017 05/10/2017 02/24/2016  Falls in the past year? No No No No  Comment - - Emmi Telephone Survey: data to providers prior to load -     Cognitive Function: MMSE - Mini Mental State Exam 06/29/2017  Orientation to time 5  Orientation to Place 5  Registration 3  Attention/ Calculation 5  Recall 1  Language- name 2 objects 2  Language- repeat 1  Language- follow 3 step command 3  Language- read & follow direction 1  Write a sentence 1  Copy design 1  Total score 28        Screening Tests Health Maintenance  Topic Date Due  . DEXA SCAN  06/29/2018 (Originally 09/08/1994)  . TETANUS/TDAP  06/29/2018 (Originally 09/07/1946)  . INFLUENZA VACCINE  Completed  . PNA vac Low Risk Adult  Completed       Plan:     Shingles vaccine at pharmacy.  Call Dr. Cristina Gong for follow up appointment.   Bring a copy of your living will and/or healthcare power of attorney to your next office visit.  I have personally reviewed and noted the following in the patient's chart:    . Medical and social history . Use of alcohol, tobacco or illicit drugs  . Current medications and supplements . Functional ability and status . Nutritional status . Physical activity . Advanced directives . List of other physicians . Hospitalizations, surgeries, and ER visits in previous 12 months . Vitals . Screenings to include cognitive, depression, and falls . Referrals and appointments  In addition, I have reviewed and discussed with patient certain preventive protocols, quality metrics, and best practice recommendations. A written personalized care plan for preventive services as well as general preventive health recommendations were provided to patient.     Gerilyn Nestle, RN   06/29/2017

## 2017-06-29 NOTE — Progress Notes (Signed)
AWV reviewed and agree.  Signed:  Crissie Sickles, MD           06/29/2017

## 2017-06-29 NOTE — Patient Instructions (Addendum)
Shingles vaccine at pharmacy.  Call Dr. Cristina Gong for follow up appointment.   Bring a copy of your living will and/or healthcare power of attorney to your next office visit.   Fall Prevention in the Home Falls can cause injuries. They can happen to people of all ages. There are many things you can do to make your home safe and to help prevent falls. What can I do on the outside of my home?  Regularly fix the edges of walkways and driveways and fix any cracks.  Remove anything that might make you trip as you walk through a door, such as a raised step or threshold.  Trim any bushes or trees on the path to your home.  Use bright outdoor lighting.  Clear any walking paths of anything that might make someone trip, such as rocks or tools.  Regularly check to see if handrails are loose or broken. Make sure that both sides of any steps have handrails.  Any raised decks and porches should have guardrails on the edges.  Have any leaves, snow, or ice cleared regularly.  Use sand or salt on walking paths during winter.  Clean up any spills in your garage right away. This includes oil or grease spills. What can I do in the bathroom?  Use night lights.  Install grab bars by the toilet and in the tub and shower. Do not use towel bars as grab bars.  Use non-skid mats or decals in the tub or shower.  If you need to sit down in the shower, use a plastic, non-slip stool.  Keep the floor dry. Clean up any water that spills on the floor as soon as it happens.  Remove soap buildup in the tub or shower regularly.  Attach bath mats securely with double-sided non-slip rug tape.  Do not have throw rugs and other things on the floor that can make you trip. What can I do in the bedroom?  Use night lights.  Make sure that you have a light by your bed that is easy to reach.  Do not use any sheets or blankets that are too big for your bed. They should not hang down onto the floor.  Have a firm  chair that has side arms. You can use this for support while you get dressed.  Do not have throw rugs and other things on the floor that can make you trip. What can I do in the kitchen?  Clean up any spills right away.  Avoid walking on wet floors.  Keep items that you use a lot in easy-to-reach places.  If you need to reach something above you, use a strong step stool that has a grab bar.  Keep electrical cords out of the way.  Do not use floor polish or wax that makes floors slippery. If you must use wax, use non-skid floor wax.  Do not have throw rugs and other things on the floor that can make you trip. What can I do with my stairs?  Do not leave any items on the stairs.  Make sure that there are handrails on both sides of the stairs and use them. Fix handrails that are broken or loose. Make sure that handrails are as long as the stairways.  Check any carpeting to make sure that it is firmly attached to the stairs. Fix any carpet that is loose or worn.  Avoid having throw rugs at the top or bottom of the stairs. If you do have throw  rugs, attach them to the floor with carpet tape.  Make sure that you have a light switch at the top of the stairs and the bottom of the stairs. If you do not have them, ask someone to add them for you. What else can I do to help prevent falls?  Wear shoes that: ? Do not have high heels. ? Have rubber bottoms. ? Are comfortable and fit you well. ? Are closed at the toe. Do not wear sandals.  If you use a stepladder: ? Make sure that it is fully opened. Do not climb a closed stepladder. ? Make sure that both sides of the stepladder are locked into place. ? Ask someone to hold it for you, if possible.  Clearly mark and make sure that you can see: ? Any grab bars or handrails. ? First and last steps. ? Where the edge of each step is.  Use tools that help you move around (mobility aids) if they are needed. These  include: ? Canes. ? Walkers. ? Scooters. ? Crutches.  Turn on the lights when you go into a dark area. Replace any light bulbs as soon as they burn out.  Set up your furniture so you have a clear path. Avoid moving your furniture around.  If any of your floors are uneven, fix them.  If there are any pets around you, be aware of where they are.  Review your medicines with your doctor. Some medicines can make you feel dizzy. This can increase your chance of falling. Ask your doctor what other things that you can do to help prevent falls. This information is not intended to replace advice given to you by your health care provider. Make sure you discuss any questions you have with your health care provider. Document Released: 03/18/2009 Document Revised: 10/28/2015 Document Reviewed: 06/26/2014 Elsevier Interactive Patient Education  2018 [place] Maintenance, Female Adopting a healthy lifestyle and getting preventive care can go a long way to promote health and wellness. Talk with your health care provider about what schedule of regular examinations is right for you. This is a good chance for you to check in with your provider about disease prevention and staying healthy. In between checkups, there are plenty of things you can do on your own. Experts have done a lot of research about which lifestyle changes and preventive measures are most likely to keep you healthy. Ask your health care provider for more information. Weight and diet Eat a healthy diet  Be sure to include plenty of vegetables, fruits, low-fat dairy products, and lean protein.  Do not eat a lot of foods high in solid fats, added sugars, or salt.  Get regular exercise. This is one of the most important things you can do for your health. ? Most adults should exercise for at least 150 minutes each week. The exercise should increase your heart rate and make you sweat (moderate-intensity exercise). ? Most adults  should also do strengthening exercises at least twice a week. This is in addition to the moderate-intensity exercise.  Maintain a healthy weight  Body mass index (BMI) is a measurement that can be used to identify possible weight problems. It estimates body fat based on height and weight. Your health care provider can help determine your BMI and help you achieve or maintain a healthy weight.  For females 56 years of age and older: ? A BMI below 18.5 is considered underweight. ? A BMI of 18.5 to 24.9 is  normal. ? A BMI of 25 to 29.9 is considered overweight. ? A BMI of 30 and above is considered obese.  Watch levels of cholesterol and blood lipids  You should start having your blood tested for lipids and cholesterol at 82 years of age, then have this test every 5 years.  You may need to have your cholesterol levels checked more often if: ? Your lipid or cholesterol levels are high. ? You are older than 82 years of age. ? You are at high risk for heart disease.  Cancer screening Lung Cancer  Lung cancer screening is recommended for adults 50-36 years old who are at high risk for lung cancer because of a history of smoking.  A yearly low-dose CT scan of the lungs is recommended for people who: ? Currently smoke. ? Have quit within the past 15 years. ? Have at least a 30-pack-year history of smoking. A pack year is smoking an average of one pack of cigarettes a day for 1 year.  Yearly screening should continue until it has been 15 years since you quit.  Yearly screening should stop if you develop a health problem that would prevent you from having lung cancer treatment.  Breast Cancer  Practice breast self-awareness. This means understanding how your breasts normally appear and feel.  It also means doing regular breast self-exams. Let your health care provider know about any changes, no matter how small.  If you are in your 20s or 30s, you should have a clinical breast exam (CBE)  by a health care provider every 1-3 years as part of a regular health exam.  If you are 27 or older, have a CBE every year. Also consider having a breast X-ray (mammogram) every year.  If you have a family history of breast cancer, talk to your health care provider about genetic screening.  If you are at high risk for breast cancer, talk to your health care provider about having an MRI and a mammogram every year.  Breast cancer gene (BRCA) assessment is recommended for women who have family members with BRCA-related cancers. BRCA-related cancers include: ? Breast. ? Ovarian. ? Tubal. ? Peritoneal cancers.  Results of the assessment will determine the need for genetic counseling and BRCA1 and BRCA2 testing.  Cervical Cancer Your health care provider may recommend that you be screened regularly for cancer of the pelvic organs (ovaries, uterus, and vagina). This screening involves a pelvic examination, including checking for microscopic changes to the surface of your cervix (Pap test). You may be encouraged to have this screening done every 3 years, beginning at age 50.  For women ages 54-65, health care providers may recommend pelvic exams and Pap testing every 3 years, or they may recommend the Pap and pelvic exam, combined with testing for human papilloma virus (HPV), every 5 years. Some types of HPV increase your risk of cervical cancer. Testing for HPV may also be done on women of any age with unclear Pap test results.  Other health care providers may not recommend any screening for nonpregnant women who are considered low risk for pelvic cancer and who do not have symptoms. Ask your health care provider if a screening pelvic exam is right for you.  If you have had past treatment for cervical cancer or a condition that could lead to cancer, you need Pap tests and screening for cancer for at least 20 years after your treatment. If Pap tests have been discontinued, your risk factors (such as  having a new sexual partner) need to be reassessed to determine if screening should resume. Some women have medical problems that increase the chance of getting cervical cancer. In these cases, your health care provider may recommend more frequent screening and Pap tests.  Colorectal Cancer  This type of cancer can be detected and often prevented.  Routine colorectal cancer screening usually begins at 82 years of age and continues through 82 years of age.  Your health care provider may recommend screening at an earlier age if you have risk factors for colon cancer.  Your health care provider may also recommend using home test kits to check for hidden blood in the stool.  A small camera at the end of a tube can be used to examine your colon directly (sigmoidoscopy or colonoscopy). This is done to check for the earliest forms of colorectal cancer.  Routine screening usually begins at age 35.  Direct examination of the colon should be repeated every 5-10 years through 82 years of age. However, you may need to be screened more often if early forms of precancerous polyps or small growths are found.  Skin Cancer  Check your skin from head to toe regularly.  Tell your health care provider about any new moles or changes in moles, especially if there is a change in a mole's shape or color.  Also tell your health care provider if you have a mole that is larger than the size of a pencil eraser.  Always use sunscreen. Apply sunscreen liberally and repeatedly throughout the day.  Protect yourself by wearing long sleeves, pants, a wide-brimmed hat, and sunglasses whenever you are outside.  Heart disease, diabetes, and high blood pressure  High blood pressure causes heart disease and increases the risk of stroke. High blood pressure is more likely to develop in: ? People who have blood pressure in the high end of the normal range (130-139/85-89 mm Hg). ? People who are overweight or  obese. ? People who are African American.  If you are 10-39 years of age, have your blood pressure checked every 3-5 years. If you are 22 years of age or older, have your blood pressure checked every year. You should have your blood pressure measured twice-once when you are at a hospital or clinic, and once when you are not at a hospital or clinic. Record the average of the two measurements. To check your blood pressure when you are not at a hospital or clinic, you can use: ? An automated blood pressure machine at a pharmacy. ? A home blood pressure monitor.  If you are between 10 years and 104 years old, ask your health care provider if you should take aspirin to prevent strokes.  Have regular diabetes screenings. This involves taking a blood sample to check your fasting blood sugar level. ? If you are at a normal weight and have a low risk for diabetes, have this test once every three years after 82 years of age. ? If you are overweight and have a high risk for diabetes, consider being tested at a younger age or more often. Preventing infection Hepatitis B  If you have a higher risk for hepatitis B, you should be screened for this virus. You are considered at high risk for hepatitis B if: ? You were born in a country where hepatitis B is common. Ask your health care provider which countries are considered high risk. ? Your parents were born in a high-risk country, and you have not  been immunized against hepatitis B (hepatitis B vaccine). ? You have HIV or AIDS. ? You use needles to inject street drugs. ? You live with someone who has hepatitis B. ? You have had sex with someone who has hepatitis B. ? You get hemodialysis treatment. ? You take certain medicines for conditions, including cancer, organ transplantation, and autoimmune conditions.  Hepatitis C  Blood testing is recommended for: ? Everyone born from [birth year] through 1963. ? Anyone with known risk factors for hepatitis  C.  Sexually transmitted infections (STIs)  You should be screened for sexually transmitted infections (STIs) including gonorrhea and chlamydia if: ? You are sexually active and are younger than 82 years of age. ? You are older than 82 years of age and your health care provider tells you that you are at risk for this type of infection. ? Your sexual activity has changed since you were last screened and you are at an increased risk for chlamydia or gonorrhea. Ask your health care provider if you are at risk.  If you do not have HIV, but are at risk, it may be recommended that you take a prescription medicine daily to prevent HIV infection. This is called pre-exposure prophylaxis (PrEP). You are considered at risk if: ? You are sexually active and do not regularly use condoms or know the HIV status of your partner(s). ? You take drugs by injection. ? You are sexually active with a partner who has HIV.  Talk with your health care provider about whether you are at high risk of being infected with HIV. If you choose to begin PrEP, you should first be tested for HIV. You should then be tested every 3 months for as long as you are taking PrEP. Pregnancy  If you are premenopausal and you may become pregnant, ask your health care provider about preconception counseling.  If you may become pregnant, take 400 to 800 micrograms (mcg) of folic acid every day.  If you want to prevent pregnancy, talk to your health care provider about birth control (contraception). Osteoporosis and menopause  Osteoporosis is a disease in which the bones lose minerals and strength with aging. This can result in serious bone fractures. Your risk for osteoporosis can be identified using a bone density scan.  If you are 70 years of age or older, or if you are at risk for osteoporosis and fractures, ask your health care provider if you should be screened.  Ask your health care provider whether you should take a calcium or  vitamin D supplement to lower your risk for osteoporosis.  Menopause may have certain physical symptoms and risks.  Hormone replacement therapy may reduce some of these symptoms and risks. Talk to your health care provider about whether hormone replacement therapy is right for you. Follow these instructions at home:  Schedule regular health, dental, and eye exams.  Stay current with your immunizations.  Do not use any tobacco products including cigarettes, chewing tobacco, or electronic cigarettes.  If you are pregnant, do not drink alcohol.  If you are breastfeeding, limit how much and how often you drink alcohol.  Limit alcohol intake to no more than 1 drink per day for nonpregnant women. One drink equals 12 ounces of beer, 5 ounces of wine, or 1 ounces of hard liquor.  Do not use street drugs.  Do not share needles.  Ask your health care provider for help if you need support or information about quitting drugs.  Tell your health care  provider if you often feel depressed.  Tell your health care provider if you have ever been abused or do not feel safe at home. This information is not intended to replace advice given to you by your health care provider. Make sure you discuss any questions you have with your health care provider. Document Released: 12/05/2010 Document Revised: 10/28/2015 Document Reviewed: 02/23/2015 Elsevier Interactive Patient Education  [employer].

## 2017-06-29 NOTE — Progress Notes (Signed)
OFFICE VISIT  06/29/2017   CC:  Chief Complaint  Patient presents with  . Follow-up    RCI   HPI:    Patient is a 84 y.o. Caucasian female who presents for f/u HTN, HLD, chronic xerostomia (suspected to be largely due to side effect from her anticholinergic med). Dry mouth is much improved!  Even with taking oxybutynin bid.  Takes the evoxac three times daily.  HTN: occ bp check at home --pt doesn't recall any numbers. BP at cardiologist's (Dr. Wynonia Lawman) recently was 193X systolic, so her losartan was increased to 50mg  bid.  Taking simvastatin 40mg  daily w/out side effect. Trying to stay active despite cold/rainy weather, but admits it is difficult. Diet is pretty healthy.  ROS: no CP, no HAs, no dizziness, no melena, no hematochezia, no SOB, no wheezing, no palpitations.   Past Medical History:  Diagnosis Date  . Abnormal finding on thyroid function test    per old records: T3 low, free T4 elevated, TSH normal.  [place]our thyroid made her feel worse and T4 went up so med d/c'd and liothyronine 5 mcg tried but also made pt feel worse.  Endo (Dr. Loanne Drilling felt like Biotin was causing the abnormal TFT's).  Repeat TFTs OFF BIOTIN x 2 wks were NORMAL.  NO FURTHER THYROID TESTING NEEDED.  Marland Kitchen Anginal pain ([place])   . Arthritis    "think it's osteo; got some in my hands"  . Bifascicular block   . Bladder infection, chronic 10/03/11   "have had one for over 1 yr"--has been on trimethaprim 100 mg qd since 2014  . CAD (coronary artery disease)    Cath 2009  Occluded LAD, 40-50% RCA and circ managed medically.  Repeat 06/22/15 no change.  . Chronic constipation    slow transit.  Barium enema to eval for colonic stricture 10/2014 was NORMAL  . Cognitive dysfunction    short term memory loss--takes ginko biloba  . COPD (chronic obstructive pulmonary disease) ([place])    Changes noted on CXR 2017  . GERD (gastroesophageal reflux disease)    (also LPR) Schatzki's ring, s/p dil '97; food impaction  08/2010, on PPI therapy since w/out further sx so no dil performed  . History of herpes zoster 06/2015   right side of chest; presented with chest pain mimicking USA--cardiac eval showed stable CAD compared to 2009.  Marland Kitchen Hyperlipidemia   . Hypertension   . Hyponatremia 10/03/2011   Na 131 on labs 02/2015 at [facility].  Baseline Na 132.  . Iron deficiency anemia 11/30/15   Hemoccults neg x 3 12/2015.  ?Mild malabsorption?  . Mixed stress and urge urinary incontinence   . RBBB with left anterior fascicular block    bifascicular block  . Recurrent UTI    Trimethaprim 100 mg qd --prophylaxis  . 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 Thrombocytopenia ([place]) 02/2013   Plts 114K  . Uterine cancer ([place]) 2000  . Xerostomia 2017/2018   Age related glandular atrophy + med effect (oxybutynin and gabapentin).  ANA and sjogren's panel NEG 06/2017.    Past Surgical History:  Procedure Laterality Date  . ABDOMINAL HYSTERECTOMY  2000  . APPENDECTOMY  2000  . CARDIAC CATHETERIZATION N/A 06/22/2015   Stable single vessel CAD, EF normal.  Procedure: Left Heart Cath and Coronary Angiography;  Surgeon: Leonie Man, MD;  Location: [place]  CV LAB;  Service: Cardiovascular;  Laterality: N/A;  . CATARACT EXTRACTION W/ INTRAOCULAR LENS  IMPLANT, BILATERAL  1990's  . [place]; 1960  . COLONOSCOPY  04/2001   Neg; pt cancelled screening colon 06/2011  . DILATION AND CURETTAGE OF UTERUS    . HERNIA REPAIR     abdominal "twice"  . LEFT HEART CATHETERIZATION WITH CORONARY ANGIOGRAM N/A 10/04/2011   Procedure: LEFT HEART CATHETERIZATION WITH CORONARY ANGIOGRAM;  Surgeon: Jacolyn Reedy, MD;  Location: [facility] CATH LAB;  Service: Cardiovascular;  Laterality: N/A;  . REPAIR KNEE LIGAMENT  ~ 2008   left  . TONSILLECTOMY     as a child  . TRANSTHORACIC ECHOCARDIOGRAM  05/2013   EF 60-65%, trivial  aortic regurg, no wall motion abnorm    Outpatient Medications Prior to Visit  Medication Sig Dispense Refill  . acetaminophen (TYLENOL) 325 MG tablet Take 325 mg by mouth every 6 (six) hours as needed.    Marland Kitchen amLODipine (NORVASC) 5 MG tablet TAKE 1 TABLET BY MOUTH DAILY 90 tablet 1  . aspirin 325 MG tablet Take 1 tablet (325 mg total) by mouth daily.    . cevimeline (EVOXAC) 30 MG capsule 1 cap tid for treatment of chronic dry mouth 90 capsule 1  . Cholecalciferol (VITAMIN D3) 1000 UNITS CAPS Take 1,000 Units by mouth daily.     Marland Kitchen co-enzyme Q-10 30 MG capsule Take 30 mg by mouth daily. Reported on 11/29/2015    . gabapentin (NEURONTIN) 100 MG capsule 1 tab po qAM, 1 tab po q afternoon, and 2 tabs po qhs 120 capsule 2  . Omega-3 Fatty Acids (FISH OIL) 1000 MG CAPS Take 1 capsule by mouth daily.    Marland Kitchen oxybutynin (DITROPAN) 5 MG tablet Take 1 tablet (5 mg total) by mouth 3 (three) times daily. (Patient taking differently: Take 5 mg by mouth daily. One tablet twice a day) 90 tablet 11  . pantoprazole (PROTONIX) 40 MG tablet Take 1 tablet (40 mg total) by mouth daily. 90 tablet 1  . polyethylene glycol powder (GLYCOLAX/MIRALAX) powder TAKE 17 G BY MOUTH 2 (TWO) TIMES DAILY. 527 g 5  . simvastatin (ZOCOR) 40 MG tablet TAKE 1 TABLET BY MOUTH EVERY DAY 90 tablet 1  . trimethoprim (TRIMPEX) 100 MG tablet TAKE 1 TABLET (100 MG TOTAL) BY MOUTH DAILY. 90 tablet 3  . losartan (COZAAR) 50 MG tablet Take 0.5 tablets (25 mg total) by mouth 2 (two) times daily. (Patient taking differently: Take 25 mg by mouth 2 (two) times daily. One tablet twice a day) 90 tablet 1  . fluticasone (FLONASE) 50 MCG/ACT nasal spray Place 2 sprays into both nostrils daily. (Patient not taking: Reported on 06/29/2017) 16 g 6  . glucosamine-chondroitin 500-400 MG tablet Take 1 tablet by mouth daily.    . nitroGLYCERIN (NITROSTAT) 0.4 MG SL tablet Place 1 tablet (0.4 mg total) under the tongue every 5 (five) minutes as needed for chest  pain. (Patient not taking: Reported on 06/29/2017) 25 tablet 12  . amoxicillin (AMOXIL) 500 MG capsule Take 1 capsule (500 mg total) by mouth 2 (two) times daily. (Patient not taking: Reported on 06/06/2017) 10 capsule 0  . Ginkgo Biloba 120 MG CAPS Take 1 capsule by mouth 3 (three) times daily.     No facility-administered medications prior to visit.     Allergies  Allergen Reactions  . Macrobid [Nitrofurantoin Macrocrystal] Other (See Comments)    "I had chills & fever"  ROS As per HPI  PE: Blood pressure 134/76, pulse 63, temperature (!) 97.5 F (36.4 C), temperature source Oral, weight 122 lb (55.3 kg), SpO2 99 %. Gen: Alert, well appearing.  Patient is oriented to person, place, time, and situation. AFFECT: pleasant, lucid thought and speech. MOUTH: oral mucosa adequately moist. CV: RRR, S1 obscured by 6-1/9 systolic murmur, with fixed split S2.  No diastolic murmur.  No rub/gallop. EXT: R leg with 2+ pitting edema.  L leg with no pitting edema.  LABS:    Chemistry      Component Value Date/Time   NA 129 (L) 11/23/2016 1601   K 5.2 (H) 11/23/2016 1601   CL 96 11/23/2016 1601   CO2 26 11/23/2016 1601   BUN 23 11/23/2016 1601   CREATININE 0.95 11/23/2016 1601   CREATININE 0.85 07/17/2016 1645      Component Value Date/Time   CALCIUM 9.0 11/23/2016 1601   ALKPHOS 54 07/11/2016 1900   AST 25 07/11/2016 1900   ALT 17 07/11/2016 1900   BILITOT 0.7 07/11/2016 1900     Lab Results  Component Value Date   WBC 8.7 07/11/2016   HGB 12.5 07/11/2016   HCT 36.2 07/11/2016   MCV 87.9 07/11/2016   PLT 146 (L) 07/11/2016   Lab Results  Component Value Date   IRON 86 11/29/2015   IRON 41 (L) 11/29/2015   TIBC 322 11/29/2015   FERRITIN 22.3 12/28/2015   Lab Results  Component Value Date   CHOL 171 06/21/2015   HDL 71 06/21/2015   LDLCALC 95 06/21/2015   TRIG 26 06/21/2015   CHOLHDL 2.4 06/21/2015   Lab Results  Component Value Date   HGBA1C 5.8 (H) 05/30/2013    IMPRESSION AND PLAN:  1) HTN: The current medical regimen is effective;  continue present plan and medications. Lytes/cr today.  Stable CV exam: suspect her murmur is secondary to aortic sclerosis, fixed split S2 likely from her bifascicular block.  She is asymptomatic. Losartan increase from 25mg  bid to 50mg  bid recently by cardiologist noted--updated in EMR. Expect his progress note to come to me soon, as per his usual.  2) HLD: tolerating statin.  Technically she is due for f/u lipid panel, but she never comes in fasting. Will monitor hepatic panel today.  3) Xerostomia: sjogren's labs in the past neg x 2.  This is from her oxybutynin, which she is doing fine on taking bid instead of tid. Continue evoxac 30mg  tid.  An After Visit Summary was printed and given to the patient.  FOLLOW UP: Return in about 3 months (around 09/27/2017) for routine chronic illness f/u.  Signed:  Crissie Sickles, MD           06/29/2017

## 2017-06-30 LAB — COMPREHENSIVE METABOLIC PANEL
AG Ratio: 1.5 (calc) (ref 1.0–2.5)
ALBUMIN MSPROF: 3.7 g/dL (ref 3.6–5.1)
ALKALINE PHOSPHATASE (APISO): 50 U/L (ref 33–130)
ALT: 12 U/L (ref 6–29)
AST: 17 U/L (ref 10–35)
BUN: 17 mg/dL (ref 7–25)
CALCIUM: 8.9 mg/dL (ref 8.6–10.4)
CHLORIDE: 100 mmol/L (ref 98–110)
CO2: 32 mmol/L (ref 20–32)
CREATININE: 0.83 mg/dL (ref 0.60–0.88)
GLOBULIN: 2.4 g/dL (ref 1.9–3.7)
Glucose, Bld: 99 mg/dL (ref 65–99)
POTASSIUM: 5.8 mmol/L — AB (ref 3.5–5.3)
Sodium: 133 mmol/L — ABNORMAL LOW (ref 135–146)
Total Bilirubin: 0.4 mg/dL (ref 0.2–1.2)
Total Protein: 6.1 g/dL (ref 6.1–8.1)

## 2017-06-30 LAB — EXTRA LAV TOP TUBE

## 2017-07-01 ENCOUNTER — Encounter: Payer: Self-pay | Admitting: Family Medicine

## 2017-07-02 ENCOUNTER — Other Ambulatory Visit: Payer: Self-pay

## 2017-07-02 DIAGNOSIS — E875 Hyperkalemia: Secondary | ICD-10-CM

## 2017-07-03 ENCOUNTER — Telehealth: Payer: Self-pay | Admitting: Family Medicine

## 2017-07-03 NOTE — Telephone Encounter (Signed)
Patient's pharmacist called to get clarification on losartan dose.  I advised that per last lab result note that patient is to be taking half tab of losartan 50 mg BID.  Pharmacist also wondering if it was okay to fill trimethoprim since this too can raise potassium.   Please advise

## 2017-07-04 DIAGNOSIS — N3946 Mixed incontinence: Secondary | ICD-10-CM | POA: Diagnosis not present

## 2017-07-04 DIAGNOSIS — N3020 Other chronic cystitis without hematuria: Secondary | ICD-10-CM | POA: Diagnosis not present

## 2017-07-04 NOTE — Telephone Encounter (Signed)
OK to fill trimethaprim.

## 2017-07-04 NOTE — Telephone Encounter (Signed)
Please advise. Thanks.  

## 2017-07-04 NOTE — Telephone Encounter (Signed)
John at [facility] OR advised and voiced understanding.

## 2017-07-06 ENCOUNTER — Other Ambulatory Visit (INDEPENDENT_AMBULATORY_CARE_PROVIDER_SITE_OTHER): Payer: Medicare Other

## 2017-07-06 DIAGNOSIS — E875 Hyperkalemia: Secondary | ICD-10-CM | POA: Diagnosis not present

## 2017-07-06 LAB — BASIC METABOLIC PANEL
BUN: 17 mg/dL (ref 6–23)
CHLORIDE: 97 meq/L (ref 96–112)
CO2: 29 mEq/L (ref 19–32)
Calcium: 8.8 mg/dL (ref 8.4–10.5)
Creatinine, Ser: 0.77 mg/dL (ref 0.40–1.20)
GFR: 75.22 mL/min (ref >60.00)
Glucose, Bld: 100 mg/dL — ABNORMAL HIGH (ref 70–99)
POTASSIUM: 4.2 meq/L (ref 3.5–5.1)
Sodium: 131 mEq/L — ABNORMAL LOW (ref 135–145)

## 2017-07-08 ENCOUNTER — Encounter: Payer: Self-pay | Admitting: Family Medicine

## 2017-07-09 ENCOUNTER — Encounter: Payer: Self-pay | Admitting: *Deleted

## 2017-07-09 ENCOUNTER — Other Ambulatory Visit: Payer: Self-pay | Admitting: *Deleted

## 2017-07-09 DIAGNOSIS — E875 Hyperkalemia: Secondary | ICD-10-CM

## 2017-07-10 ENCOUNTER — Encounter: Payer: Self-pay | Admitting: Family Medicine

## 2017-07-10 ENCOUNTER — Ambulatory Visit (INDEPENDENT_AMBULATORY_CARE_PROVIDER_SITE_OTHER): Payer: Medicare Other | Admitting: Family Medicine

## 2017-07-10 VITALS — BP 151/76 | HR 61 | Temp 97.5°F | Resp 16 | Ht 64.0 in | Wt 117.8 lb

## 2017-07-10 DIAGNOSIS — R300 Dysuria: Secondary | ICD-10-CM

## 2017-07-10 DIAGNOSIS — I10 Essential (primary) hypertension: Secondary | ICD-10-CM | POA: Diagnosis not present

## 2017-07-10 DIAGNOSIS — E875 Hyperkalemia: Secondary | ICD-10-CM | POA: Diagnosis not present

## 2017-07-10 DIAGNOSIS — N390 Urinary tract infection, site not specified: Secondary | ICD-10-CM | POA: Diagnosis not present

## 2017-07-10 DIAGNOSIS — N3090 Cystitis, unspecified without hematuria: Secondary | ICD-10-CM

## 2017-07-10 LAB — POCT URINALYSIS DIPSTICK
Appearance: NORMAL
BILIRUBIN UA: NEGATIVE
Glucose, UA: NEGATIVE
KETONES UA: NEGATIVE
Nitrite, UA: NEGATIVE
ODOR: NORMAL
Protein, UA: NEGATIVE
Spec Grav, UA: 1.015 (ref 1.010–1.025)
Urobilinogen, UA: 0.2 E.U./dL
pH, UA: 7 (ref 5.0–8.0)

## 2017-07-10 MED ORDER — CEPHALEXIN 500 MG PO CAPS: 500.0000 mg | ORAL_CAPSULE | Freq: Two times a day (BID) | ORAL | 0 refills | Status: DC

## 2017-07-10 NOTE — Progress Notes (Signed)
OFFICE VISIT  07/10/2017   CC:  Chief Complaint  Patient presents with  . Urinary Tract Infection    ? dysuria   HPI:    Patient is a 84 y.o. Caucasian female who presents for urinary complaints. Has hx of recurrent UTI and is on prophylactic trimethaprim 100mg  qd. Onset yesterday of burning with urination, better this morning. Some urge to urinate leads to normal urine output but sometimes nothing will come out. Drinking fluids fine.  No fever, nausea, or abd pain or flank pain.  Reviewed recent hyperkalemia and adjustment of losartan dose: she says that yesterday was the first day she started taking this at a dose of 50 mg qAM and 25 mg qhs. Plan to recheck K in 1 week.  ROS: no dizziness, no muscle cramps, no palpitations, no CP, no SOB, no HAs. Chronic mild fatigue.    Past Medical History:  Diagnosis Date  . Abnormal finding on thyroid function test    per old records: T3 low, free T4 elevated, TSH normal.  [place] thyroid made her feel worse and T4 went up so med d/c'd and liothyronine 5 mcg tried but also made pt feel worse.  Endo (Dr. Loanne Drilling felt like Biotin was causing the abnormal TFT's).  Repeat TFTs OFF BIOTIN x 2 wks were NORMAL.  NO FURTHER THYROID TESTING NEEDED.  Marland Kitchen Anginal pain ([place])   . Arthritis    "think it's osteo; got some in my hands"  . Bifascicular block   . Bladder infection, chronic 10/03/11   "have had one for over 1 yr"--has been on trimethaprim 100 mg qd since 2014  . CAD (coronary artery disease)    Cath 2009  Occluded LAD, 40-50% RCA and circ managed medically.  Repeat 06/22/15 no change.  . Chronic constipation    slow transit.  Barium enema to eval for colonic stricture 10/2014 was NORMAL  . Cognitive dysfunction    short term memory loss--takes ginko biloba  . COPD (chronic obstructive pulmonary disease) ([place])    Changes noted on CXR 2017  . GERD (gastroesophageal reflux disease)    (also LPR) Schatzki's ring, s/p dil '97; food impaction  08/2010, on PPI therapy since w/out further sx so no dil performed  . History of herpes zoster 06/2015   right side of chest; presented with chest pain mimicking [place]--cardiac eval showed stable CAD compared to 2009.  Marland Kitchen Hyperkalemia 06/2017   suspected due to increase of losartan from 25mg  bid to 50mg  bid.  Dose lowered back to 25 mg bid K normalized:  titrating losartan dose up slowly  . Hyperlipidemia   . Hypertension   . Hyponatremia 10/03/2011   Na 131 on labs 02/2015 at [facility].  Baseline Na 132.  . Iron deficiency anemia 11/30/15   Hemoccults neg x 3 12/2015.  ?Mild malabsorption?  . Mixed stress and urge urinary incontinence   . RBBB with left anterior fascicular block    bifascicular block  . Recurrent UTI    Trimethaprim 100 mg qd --prophylaxis  . 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 Thrombocytopenia ([place]) 02/2013   Plts 114K  . Uterine cancer ([place]) 2000  . Xerostomia 2017/2018   Age related glandular atrophy + med effect (oxybutynin and gabapentin).  ANA and sjogren's panel NEG 06/2017.    Past Surgical History:  Procedure Laterality Date  . ABDOMINAL  HYSTERECTOMY  2000  . APPENDECTOMY  2000  . CARDIAC CATHETERIZATION N/A 06/22/2015   Stable single vessel CAD, EF normal.  Procedure: Left Heart Cath and Coronary Angiography;  Surgeon: Leonie Man, MD;  Location: [place] CV LAB;  Service: Cardiovascular;  Laterality: N/A;  . CATARACT EXTRACTION W/ INTRAOCULAR LENS  IMPLANT, BILATERAL  1990's  . [place]; 1960  . COLONOSCOPY  04/2001   Neg; pt cancelled screening colon 06/2011  . DILATION AND CURETTAGE OF UTERUS    . HERNIA REPAIR     abdominal "twice"  . LEFT HEART CATHETERIZATION WITH CORONARY ANGIOGRAM N/A 10/04/2011   Procedure: LEFT HEART CATHETERIZATION WITH CORONARY ANGIOGRAM;  Surgeon: Jacolyn Reedy, MD;  Location: [facility] CATH LAB;  Service:  Cardiovascular;  Laterality: N/A;  . REPAIR KNEE LIGAMENT  ~ 2008   left  . TONSILLECTOMY     as a child  . TRANSTHORACIC ECHOCARDIOGRAM  05/2013   EF 60-65%, trivial aortic regurg, no wall motion abnorm    Outpatient Medications Prior to Visit  Medication Sig Dispense Refill  . acetaminophen (TYLENOL) 325 MG tablet Take 325 mg by mouth every 6 (six) hours as needed.    Marland Kitchen amLODipine (NORVASC) 5 MG tablet TAKE 1 TABLET BY MOUTH DAILY 90 tablet 1  . aspirin 325 MG tablet Take 1 tablet (325 mg total) by mouth daily.    . cevimeline (EVOXAC) 30 MG capsule 1 cap tid for treatment of chronic dry mouth 90 capsule 1  . Cholecalciferol (VITAMIN D3) 1000 UNITS CAPS Take 1,000 Units by mouth daily.     Marland Kitchen co-enzyme Q-10 30 MG capsule Take 30 mg by mouth daily. Reported on 11/29/2015    . fluticasone (FLONASE) 50 MCG/ACT nasal spray Place 2 sprays into both nostrils daily. 16 g 6  . gabapentin (NEURONTIN) 100 MG capsule 1 tab po qAM, 1 tab po q afternoon, and 2 tabs po qhs 120 capsule 2  . losartan (COZAAR) 50 MG tablet Take 1 tablet (50 mg total) by mouth 2 (two) times daily. (Patient taking differently: Taking 50mg  in the am and 25mg  in the evening) 180 tablet 1  . nitroGLYCERIN (NITROSTAT) 0.4 MG SL tablet Place 1 tablet (0.4 mg total) under the tongue every 5 (five) minutes as needed for chest pain. 25 tablet 12  . Omega-3 Fatty Acids (FISH OIL) 1000 MG CAPS Take 1 capsule by mouth daily.    Marland Kitchen oxybutynin (DITROPAN) 5 MG tablet Take 1 tablet (5 mg total) by mouth 3 (three) times daily. (Patient taking differently: Take 5 mg by mouth daily. One tablet twice a day) 90 tablet 11  . pantoprazole (PROTONIX) 40 MG tablet Take 1 tablet (40 mg total) by mouth daily. 90 tablet 1  . polyethylene glycol powder (GLYCOLAX/MIRALAX) powder TAKE 17 G BY MOUTH 2 (TWO) TIMES DAILY. 527 g 5  . simvastatin (ZOCOR) 40 MG tablet TAKE 1 TABLET BY MOUTH EVERY DAY 90 tablet 1  . trimethoprim (TRIMPEX) 100 MG tablet TAKE 1  TABLET (100 MG TOTAL) BY MOUTH DAILY. 90 tablet 3  . glucosamine-chondroitin 500-400 MG tablet Take 1 tablet by mouth daily.     No facility-administered medications prior to visit.     Allergies  Allergen Reactions  . Macrobid [Nitrofurantoin Macrocrystal] Other (See Comments)    "I had chills & fever"    ROS As per HPI  PE: Blood pressure (!) 151/76, pulse 61, temperature (!) 97.5 F (36.4 C), temperature source Oral, resp.  rate 16, height 5\' 4"  (1.626 m), weight 117 lb 12 oz (53.4 kg), SpO2 98 %. Gen: Alert, well appearing.  Patient is oriented to person, place, time, and situation. AFFECT: pleasant, lucid thought and speech. No further exam today.  LABS:    Chemistry      Component Value Date/Time   NA 131 (L) 07/06/2017 1316   K 4.2 07/06/2017 1316   CL 97 07/06/2017 1316   CO2 29 07/06/2017 1316   BUN 17 07/06/2017 1316   CREATININE 0.77 07/06/2017 1316   CREATININE 0.83 06/29/2017 1425      Component Value Date/Time   CALCIUM 8.8 07/06/2017 1316   ALKPHOS 54 07/11/2016 1900   AST 17 06/29/2017 1425   ALT 12 06/29/2017 1425   BILITOT 0.4 06/29/2017 1425     CC UA today: trace lysed blood, 3+ Leu, o/w normal.  IMPRESSION AND PLAN:  1) Dysuria x 1d, improved/?resolved today. Some difficulty urinating today in office--could be related to infection. Sent urine for c/s. Started cephalexin 500 mg bid x 3d.  2) Hyperkalemia; this occurred after losartan dose increase. K normalized with drop in losartan dose from 50 bid to 25 bid. Increased dose back to 50 qAM and 25 qpm and plan recheck BMET after 1 wk on this dose.  An After Visit Summary was printed and given to the patient.  FOLLOW UP: Return keep 09/2017 scheduled visit with me.  Signed:  Crissie Sickles, MD           07/10/2017

## 2017-07-10 NOTE — Patient Instructions (Signed)
Lab visit in 1 week to recheck BMET (order is in).

## 2017-07-11 LAB — URINE CULTURE
MICRO NUMBER:: 90157314
SPECIMEN QUALITY:: ADEQUATE

## 2017-07-12 DIAGNOSIS — H18233 Secondary corneal edema, bilateral: Secondary | ICD-10-CM | POA: Diagnosis not present

## 2017-07-12 DIAGNOSIS — Z961 Presence of intraocular lens: Secondary | ICD-10-CM | POA: Diagnosis not present

## 2017-07-19 ENCOUNTER — Other Ambulatory Visit (INDEPENDENT_AMBULATORY_CARE_PROVIDER_SITE_OTHER): Payer: Medicare Other

## 2017-07-19 DIAGNOSIS — E875 Hyperkalemia: Secondary | ICD-10-CM

## 2017-07-19 LAB — BASIC METABOLIC PANEL
BUN: 18 mg/dL (ref 6–23)
CALCIUM: 8.9 mg/dL (ref 8.4–10.5)
CO2: 32 meq/L (ref 19–32)
Chloride: 96 mEq/L (ref 96–112)
Creatinine, Ser: 0.74 mg/dL (ref 0.40–1.20)
GFR: 78.75 mL/min (ref >60.00)
Glucose, Bld: 120 mg/dL — ABNORMAL HIGH (ref 70–99)
Potassium: 4.8 mEq/L (ref 3.5–5.1)
SODIUM: 132 meq/L — AB (ref 135–145)

## 2017-07-20 ENCOUNTER — Encounter: Payer: Self-pay | Admitting: Family Medicine

## 2017-07-31 ENCOUNTER — Other Ambulatory Visit: Payer: Self-pay | Admitting: Family Medicine

## 2017-07-31 NOTE — Telephone Encounter (Signed)
RF request for cevimeline LOV: 07/10/17 Next ov: 09/27/17 Last written: 06/06/17 #90 w/ 1RF  Please advise. Thanks.

## 2017-08-06 ENCOUNTER — Encounter: Payer: Self-pay | Admitting: Family Medicine

## 2017-08-06 ENCOUNTER — Ambulatory Visit (INDEPENDENT_AMBULATORY_CARE_PROVIDER_SITE_OTHER): Payer: Medicare Other | Admitting: Family Medicine

## 2017-08-06 ENCOUNTER — Telehealth: Payer: Self-pay | Admitting: Family Medicine

## 2017-08-06 VITALS — BP 136/68 | HR 58 | Temp 97.7°F | Resp 16 | Ht 64.0 in | Wt 117.8 lb

## 2017-08-06 DIAGNOSIS — N3090 Cystitis, unspecified without hematuria: Secondary | ICD-10-CM | POA: Diagnosis not present

## 2017-08-06 DIAGNOSIS — R300 Dysuria: Secondary | ICD-10-CM

## 2017-08-06 LAB — POC URINALSYSI DIPSTICK (AUTOMATED)
Bilirubin, UA: NEGATIVE
Blood, UA: NEGATIVE
Glucose, UA: NEGATIVE
Ketones, UA: NEGATIVE
Nitrite, UA: NEGATIVE
Spec Grav, UA: 1.015 (ref 1.010–1.025)
UROBILINOGEN UA: 0.2 U/dL
pH, UA: 6 (ref 5.0–8.0)

## 2017-08-06 MED ORDER — CEPHALEXIN 500 MG PO CAPS: 500.0000 mg | ORAL_CAPSULE | Freq: Three times a day (TID) | ORAL | 0 refills | Status: DC

## 2017-08-06 MED ORDER — FLUTICASONE PROPIONATE 50 MCG/ACT NA SUSP: 2.0000 | Freq: Every day | NASAL | 6 refills | Status: DC

## 2017-08-06 NOTE — Progress Notes (Signed)
OFFICE VISIT  08/06/2017   CC:  Chief Complaint  Patient presents with  . Urinary Tract Infection    ?   HPI:    Patient is a 84 y.o. Caucasian female with hx of recurrent UTI who presents for urinary complaints. Onset of dysuria about 2 d/a, increased frequency and urgency, urine appeared darker yellow. No flank pain, CVA pain, nausea, or fever.  She takes trimethaprim 100 mg qd for UTI prophylaxis. Of note, most recent urine culture on 07/10/17 showed only small growth of multiple organisms, suggesting contaminated sample.  Home bp: she doesn't recall any recent checks at home.  Past Medical History:  Diagnosis Date  . Abnormal finding on thyroid function test    per old records: T3 low, free T4 elevated, TSH normal.  Armour thyroid made her feel worse and T4 went up so med d/c'd and liothyronine 5 mcg tried but also made pt feel worse.  Endo (Dr. Loanne Drilling felt like Biotin was causing the abnormal TFT's).  Repeat TFTs OFF BIOTIN x 2 wks were NORMAL.  NO FURTHER THYROID TESTING NEEDED.  Marland Kitchen Anginal pain ([place])   . Arthritis    "think it's osteo; got some in my hands"  . Bifascicular block   . Bladder infection, chronic 10/03/11   "have had one for over 1 yr"--has been on trimethaprim 100 mg qd since 2014  . CAD (coronary artery disease)    Cath 2009  Occluded LAD, 40-50% RCA and circ managed medically.  Repeat 06/22/15 no change.  . Chronic constipation    slow transit.  Barium enema to eval for colonic stricture 10/2014 was NORMAL  . Cognitive dysfunction    short term memory loss--takes ginko biloba  . COPD (chronic obstructive pulmonary disease) ([place])    Changes noted on CXR 2017  . GERD (gastroesophageal reflux disease)    (also LPR) Schatzki's ring, s/p dil '97; food impaction 08/2010, on PPI therapy since w/out further sx so no dil performed  . History of herpes zoster 06/2015   right side of chest; presented with chest pain mimicking USA--cardiac eval showed stable CAD compared  to 2009.  Marland Kitchen Hyperkalemia 06/2017   suspected due to increase of losartan from 25mg  bid to 50mg  bid.  Dose lowered back to 25 mg bid K normalized.  After increase again to 50 qAM and 25 qPM potassium 4.8 so I did not increase dose any further (07/20/17).  . Hyperlipidemia   . Hypertension   . Hyponatremia 10/03/2011   Na 131 on labs 02/2015 at [facility].  Baseline Na 132.  . Iron deficiency anemia 11/30/15   Hemoccults neg x 3 12/2015.  ?Mild malabsorption?  . Mixed stress and urge urinary incontinence   . RBBB with left anterior fascicular block    bifascicular block  . Recurrent UTI    Trimethaprim 100 mg qd --prophylaxis  . 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 Thrombocytopenia ([place]) 02/2013   Plts 114K  . Uterine cancer ([place]) 2000  . Xerostomia 2017/2018   Age related glandular atrophy + med effect (oxybutynin and gabapentin).  ANA and sjogren's panel NEG 06/2017.    Past Surgical History:  Procedure Laterality Date  . ABDOMINAL HYSTERECTOMY  2000  . APPENDECTOMY  2000  . CARDIAC CATHETERIZATION N/A 06/22/2015   Stable single vessel CAD, EF normal.  Procedure: Left Heart Cath and  Coronary Angiography;  Surgeon: Leonie Man, MD;  Location: [place] [facility];  Service: Cardiovascular;  Laterality: N/A;  . CATARACT EXTRACTION W/ INTRAOCULAR LENS  IMPLANT, BILATERAL  1990's  . [place]; 1960  . COLONOSCOPY  04/2001   Neg; pt cancelled screening colon 06/2011  . DILATION AND CURETTAGE OF UTERUS    . HERNIA REPAIR     abdominal "twice"  . LEFT HEART CATHETERIZATION WITH CORONARY ANGIOGRAM N/A 10/04/2011   Procedure: LEFT HEART CATHETERIZATION WITH CORONARY ANGIOGRAM;  Surgeon: Jacolyn Reedy, MD;  Location: [facility] CATH LAB;  Service: Cardiovascular;  Laterality: N/A;  . REPAIR KNEE LIGAMENT  ~ 2008   left  . TONSILLECTOMY     as a child  . TRANSTHORACIC  ECHOCARDIOGRAM  05/2013   EF 60-65%, trivial aortic regurg, no wall motion abnorm    Outpatient Medications Prior to Visit  Medication Sig Dispense Refill  . acetaminophen (TYLENOL) 325 MG tablet Take 325 mg by mouth every 6 (six) hours as needed.    Marland Kitchen amLODipine (NORVASC) 5 MG tablet TAKE 1 TABLET BY MOUTH DAILY 90 tablet 1  . aspirin 325 MG tablet Take 1 tablet (325 mg total) by mouth daily.    . cevimeline (EVOXAC) 30 MG capsule TAKE 1 CAPSULE 3 TIMES A DAY FOR TREATMENT OF CHRONIC DRY MOUTH 270 capsule 3  . Cholecalciferol (VITAMIN D3) 1000 UNITS CAPS Take 1,000 Units by mouth daily.     Marland Kitchen co-enzyme Q-10 30 MG capsule Take 30 mg by mouth daily. Reported on 11/29/2015    . fluticasone (FLONASE) 50 MCG/ACT nasal spray Place 2 sprays into both nostrils daily. 16 g 6  . gabapentin (NEURONTIN) 100 MG capsule 1 tab po qAM, 1 tab po q afternoon, and 2 tabs po qhs 120 capsule 2  . glucosamine-chondroitin 500-400 MG tablet Take 1 tablet by mouth daily.    Marland Kitchen losartan (COZAAR) 50 MG tablet Take 1 tablet (50 mg total) by mouth 2 (two) times daily. (Patient taking differently: Taking 50mg  in the am and 25mg  in the evening) 180 tablet 1  . nitroGLYCERIN (NITROSTAT) 0.4 MG SL tablet Place 1 tablet (0.4 mg total) under the tongue every 5 (five) minutes as needed for chest pain. 25 tablet 12  . Omega-3 Fatty Acids (FISH OIL) 1000 MG CAPS Take 1 capsule by mouth daily.    Marland Kitchen oxybutynin (DITROPAN) 5 MG tablet Take 1 tablet (5 mg total) by mouth 3 (three) times daily. (Patient taking differently: Take 5 mg by mouth daily. One tablet twice a day) 90 tablet 11  . pantoprazole (PROTONIX) 40 MG tablet Take 1 tablet (40 mg total) by mouth daily. 90 tablet 1  . polyethylene glycol powder (GLYCOLAX/MIRALAX) powder TAKE 17 G BY MOUTH 2 (TWO) TIMES DAILY. 527 g 5  . simvastatin (ZOCOR) 40 MG tablet TAKE 1 TABLET BY MOUTH EVERY DAY 90 tablet 1  . trimethoprim (TRIMPEX) 100 MG tablet TAKE 1 TABLET (100 MG TOTAL) BY MOUTH  DAILY. 90 tablet 3  . cephALEXin (KEFLEX) 500 MG capsule Take 1 capsule (500 mg total) by mouth 2 (two) times daily. (Patient not taking: Reported on 08/06/2017) 6 capsule 0   No facility-administered medications prior to visit.     Allergies  Allergen Reactions  . Macrobid [Nitrofurantoin Macrocrystal] Other (See Comments)    "I had chills & fever"    ROS As per HPI  PE: Blood pressure (!) 167/68, pulse (!) 58, temperature 97.7 F (36.5 C),  temperature source Oral, resp. rate 16, height 5\' 4"  (1.626 m), weight 117 lb 12 oz (53.4 kg), SpO2 98 %. Gen: Alert, well appearing.  Patient is oriented to person, place, time, and situation. AFFECT: pleasant, lucid thought and speech. CV: RRR, soft syst murmur with fixed split S2, no diastolic murmur.  No rub/gallop. Chest is clear, no wheezing or rales. Normal symmetric air entry throughout both lung fields. No chest wall deformities or tenderness. ABD: soft NT. EXT: no clubbing, cyanosis, or edema.    LABS:    Chemistry      Component Value Date/Time   NA 132 (L) 07/19/2017 1358   K 4.8 07/19/2017 1358   CL 96 07/19/2017 1358   CO2 32 07/19/2017 1358   BUN 18 07/19/2017 1358   CREATININE 0.74 07/19/2017 1358   CREATININE 0.83 06/29/2017 1425      Component Value Date/Time   CALCIUM 8.9 07/19/2017 1358   ALKPHOS 54 07/11/2016 1900   AST 17 06/29/2017 1425   ALT 12 06/29/2017 1425   BILITOT 0.4 06/29/2017 1425     UA dipstick today: Large leukocytes, 15 mg/dl protein, o/w normal.  IMPRESSION AND PLAN:  Acute UTI, no sign of upper urinary tract infection. Start cephalexin 500 mg tid x 3d.  Send urine for c/s. BP recheck 136/68 today.  No med change made.  An After Visit Summary was printed and given to the patient.  FOLLOW UP: No Follow-up on file.  Signed:  Crissie Sickles, MD           08/06/2017

## 2017-08-06 NOTE — Telephone Encounter (Signed)
Pt was advised by [facility] and scheduled to see Dr. Anitra Lauth today (08/06/17 at 2:30pm) for UTI.

## 2017-08-06 NOTE — Telephone Encounter (Signed)
Per Dr. Anitra Lauth pt needs office visit.   Left message for pt to call back.   Okay for [facility] to schedule pt.

## 2017-08-06 NOTE — Addendum Note (Signed)
Addended by: Onalee Hua on: 08/06/2017 03:17 PM   Modules accepted: Orders

## 2017-08-06 NOTE — Telephone Encounter (Signed)
Copied from [place]. Topic: Quick Communication - See Telephone Encounter >> Aug 06, 2017  9:33 AM Percell Belt A wrote: CRM for notification. See Telephone encounter for: pt called in and stated that need just wants to come by office and get her urine checked for a uti?  She would like a call back to see if this is ok?     Best number [phone]  08/06/17.

## 2017-08-09 LAB — URINE CULTURE
MICRO NUMBER: 90278502
SPECIMEN QUALITY:: ADEQUATE

## 2017-08-27 DIAGNOSIS — R198 Other specified symptoms and signs involving the digestive system and abdomen: Secondary | ICD-10-CM | POA: Diagnosis not present

## 2017-08-27 DIAGNOSIS — K5900 Constipation, unspecified: Secondary | ICD-10-CM | POA: Diagnosis not present

## 2017-09-27 ENCOUNTER — Other Ambulatory Visit: Payer: Self-pay | Admitting: Family Medicine

## 2017-09-27 ENCOUNTER — Ambulatory Visit (INDEPENDENT_AMBULATORY_CARE_PROVIDER_SITE_OTHER): Payer: Medicare Other | Admitting: Family Medicine

## 2017-09-27 ENCOUNTER — Encounter: Payer: Self-pay | Admitting: Family Medicine

## 2017-09-27 VITALS — BP 90/48 | HR 77 | Temp 98.4°F | Resp 16 | Ht 64.0 in | Wt 120.2 lb

## 2017-09-27 DIAGNOSIS — H52203 Unspecified astigmatism, bilateral: Secondary | ICD-10-CM | POA: Diagnosis not present

## 2017-09-27 DIAGNOSIS — R682 Dry mouth, unspecified: Secondary | ICD-10-CM

## 2017-09-27 DIAGNOSIS — H04123 Dry eye syndrome of bilateral lacrimal glands: Secondary | ICD-10-CM | POA: Diagnosis not present

## 2017-09-27 DIAGNOSIS — Z961 Presence of intraocular lens: Secondary | ICD-10-CM | POA: Diagnosis not present

## 2017-09-27 DIAGNOSIS — E875 Hyperkalemia: Secondary | ICD-10-CM

## 2017-09-27 DIAGNOSIS — E7800 Pure hypercholesterolemia, unspecified: Secondary | ICD-10-CM

## 2017-09-27 DIAGNOSIS — G3184 Mild cognitive impairment, so stated: Secondary | ICD-10-CM | POA: Diagnosis not present

## 2017-09-27 DIAGNOSIS — N3941 Urge incontinence: Secondary | ICD-10-CM | POA: Diagnosis not present

## 2017-09-27 DIAGNOSIS — I10 Essential (primary) hypertension: Secondary | ICD-10-CM | POA: Diagnosis not present

## 2017-09-27 DIAGNOSIS — K117 Disturbances of salivary secretion: Secondary | ICD-10-CM

## 2017-09-27 LAB — BASIC METABOLIC PANEL
BUN: 20 mg/dL (ref 6–23)
CHLORIDE: 100 meq/L (ref 96–112)
CO2: 28 meq/L (ref 19–32)
Calcium: 8.6 mg/dL (ref 8.4–10.5)
Creatinine, Ser: 0.97 mg/dL (ref 0.40–1.20)
GFR: 57.6 mL/min — ABNORMAL LOW (ref >60.00)
Glucose, Bld: 120 mg/dL — ABNORMAL HIGH (ref 70–99)
POTASSIUM: 4.5 meq/L (ref 3.5–5.1)
SODIUM: 134 meq/L — AB (ref 135–145)

## 2017-09-27 MED ORDER — LOSARTAN POTASSIUM 50 MG PO TABS: 50.0000 mg | ORAL_TABLET | Freq: Every day | ORAL | 1 refills | Status: DC

## 2017-09-27 NOTE — Progress Notes (Signed)
OFFICE VISIT  09/27/2017   CC:  Chief Complaint  Patient presents with  . Follow-up    RCI   HPI:    Patient is a 84 y.o. Caucasian female who presents for 3 mo f/u HTN, HLD, hx of mild hyperkalemia which resolved with decrease in losartan.  After slight increase in losartan back up to 50 qAM and 25 qPM, her potassium rose on 07/19/17 recheck but not to the level of being elevated and I did not change her dose of losartan any further at that time.  HTN: not monitoring bp at home.  Compliant with med.  HLD: compliant with statin.  Chronic dry mouth: stopped oxybutynin and switched to detrol that her urologist rx'd recently when pt called his office. Question of dry mouth getting a little better.  She is easily confused about her meds, what they are meant to treat and what side effects may be coming from them, etc.  Memory problems, long term, question of worsening lately since stopping oxybutynin and starting detrol LA.  She says right away she does not want any medication.  ROS: no f/c, no n/v or abd pain, no dysuria or hematuria, no melena, no CP or SOB. Continues to struggle with short term memory problems and mild cognitive impairment.   Past Medical History:  Diagnosis Date  . Abnormal finding on thyroid function test    per old records: T3 low, free T4 elevated, TSH normal.  [place] thyroid made her feel worse and T4 went up so med d/c'd and liothyronine 5 mcg tried but also made pt feel worse.  Endo (Dr. Loanne Drilling felt like Biotin was causing the abnormal TFT's).  Repeat TFTs OFF BIOTIN x 2 wks were NORMAL.  NO FURTHER THYROID TESTING NEEDED.  Marland Kitchen Anginal pain ([place])   . Arthritis    "think it's osteo; got some in my hands"  . Bifascicular block   . Bladder infection, chronic 10/03/11   "have had one for over 1 yr"--has been on trimethaprim 100 mg qd since 2014  . CAD (coronary artery disease)    Cath 2009  Occluded LAD, 40-50% RCA and circ managed medically.  Repeat 06/22/15 no  change.  . Chronic constipation    slow transit.  Barium enema to eval for colonic stricture 10/2014 was NORMAL  . Cognitive dysfunction    short term memory loss--takes ginko biloba  . COPD (chronic obstructive pulmonary disease) ([place])    Changes noted on CXR 2017  . GERD (gastroesophageal reflux disease)    (also LPR) Schatzki's ring, s/p dil '97; food impaction 08/2010, on PPI therapy since w/out further sx so no dil performed  . History of herpes zoster 06/2015   right side of chest; presented with chest pain mimicking [place]--cardiac eval showed stable CAD compared to 2009.  Marland Kitchen Hyperkalemia 06/2017   suspected due to increase of losartan from 25mg  bid to 50mg  bid.  Dose lowered back to 25 mg bid K normalized.  After increase again to 50 qAM and 25 qPM potassium 4.8 so I did not increase dose any further (07/20/17).  . Hyperlipidemia   . Hypertension   . Hyponatremia 10/03/2011   Na 131 on labs 02/2015 at [facility].  Baseline Na 132.  . Iron deficiency anemia 11/30/15   Hemoccults neg x 3 12/2015.  ?Mild malabsorption?  . Mixed stress and urge urinary incontinence   . RBBB with left anterior fascicular block    bifascicular block  . Recurrent UTI  Trimethaprim 100 mg qd --prophylaxis  . 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 Thrombocytopenia ([place]) 02/2013   Plts 114K  . Uterine cancer ([place]) 2000  . Xerostomia 2017/2018   Age related glandular atrophy + med effect (oxybutynin and gabapentin).  ANA and sjogren's panel NEG 06/2017.    Past Surgical History:  Procedure Laterality Date  . ABDOMINAL HYSTERECTOMY  2000  . APPENDECTOMY  2000  . CARDIAC CATHETERIZATION N/A 06/22/2015   Stable single vessel CAD, EF normal.  Procedure: Left Heart Cath and Coronary Angiography;  Surgeon: Leonie Man, MD;  Location: [place] [facility];  Service: Cardiovascular;  Laterality: N/A;  .  CATARACT EXTRACTION W/ INTRAOCULAR LENS  IMPLANT, BILATERAL  1990's  . [place]; 1960  . COLONOSCOPY  04/2001   Neg; pt cancelled screening colon 06/2011  . DILATION AND CURETTAGE OF UTERUS    . HERNIA REPAIR     abdominal "twice"  . LEFT HEART CATHETERIZATION WITH CORONARY ANGIOGRAM N/A 10/04/2011   Procedure: LEFT HEART CATHETERIZATION WITH CORONARY ANGIOGRAM;  Surgeon: Jacolyn Reedy, MD;  Location: [facility] CATH LAB;  Service: Cardiovascular;  Laterality: N/A;  . REPAIR KNEE LIGAMENT  ~ 2008   left  . TONSILLECTOMY     as a child  . TRANSTHORACIC ECHOCARDIOGRAM  05/2013   EF 60-65%, trivial aortic regurg, no wall motion abnorm    Outpatient Medications Prior to Visit  Medication Sig Dispense Refill  . acetaminophen (TYLENOL) 325 MG tablet Take 325 mg by mouth every 6 (six) hours as needed.    Marland Kitchen amLODipine (NORVASC) 5 MG tablet TAKE 1 TABLET BY MOUTH DAILY 90 tablet 1  . aspirin 325 MG tablet Take 1 tablet (325 mg total) by mouth daily.    . cevimeline (EVOXAC) 30 MG capsule TAKE 1 CAPSULE 3 TIMES A DAY FOR TREATMENT OF CHRONIC DRY MOUTH 270 capsule 3  . Cholecalciferol (VITAMIN D3) 1000 UNITS CAPS Take 1,000 Units by mouth daily.     Marland Kitchen co-enzyme Q-10 30 MG capsule Take 30 mg by mouth daily. Reported on 11/29/2015    . fluticasone (FLONASE) 50 MCG/ACT nasal spray Place 2 sprays into both nostrils daily. 16 g 6  . gabapentin (NEURONTIN) 100 MG capsule 1 tab po qAM, 1 tab po q afternoon, and 2 tabs po qhs 120 capsule 2  . losartan (COZAAR) 50 MG tablet Take 1 tablet (50 mg total) by mouth 2 (two) times daily. (Patient taking differently: Taking 50mg  in the am and 25mg  in the evening) 180 tablet 1  . nitroGLYCERIN (NITROSTAT) 0.4 MG SL tablet Place 1 tablet (0.4 mg total) under the tongue every 5 (five) minutes as needed for chest pain. 25 tablet 12  . Omega-3 Fatty Acids (FISH OIL) 1000 MG CAPS Take 1 capsule by mouth daily.    . pantoprazole (PROTONIX) 40 MG tablet Take 1 tablet  (40 mg total) by mouth daily. 90 tablet 1  . polyethylene glycol powder (GLYCOLAX/MIRALAX) powder TAKE 17 G BY MOUTH 2 (TWO) TIMES DAILY. 527 g 5  . simvastatin (ZOCOR) 40 MG tablet TAKE 1 TABLET BY MOUTH EVERY DAY 90 tablet 1  . tolterodine (DETROL LA) 4 MG 24 hr capsule Take 1 capsule by mouth daily.  11  . trimethoprim (TRIMPEX) 100 MG tablet TAKE 1 TABLET (100 MG TOTAL) BY MOUTH DAILY. 90 tablet 3  .  cephALEXin (KEFLEX) 500 MG capsule Take 1 capsule (500 mg total) by mouth 3 (three) times daily. (Patient not taking: Reported on 09/27/2017) 9 capsule 0  . glucosamine-chondroitin 500-400 MG tablet Take 1 tablet by mouth daily.    Marland Kitchen oxybutynin (DITROPAN) 5 MG tablet Take 1 tablet (5 mg total) by mouth 3 (three) times daily. (Patient not taking: Reported on 09/27/2017) 90 tablet 11   No facility-administered medications prior to visit.     Allergies  Allergen Reactions  . Macrobid [Nitrofurantoin Macrocrystal] Other (See Comments)    "I had chills & fever"    ROS As per HPI  PE: Blood pressure (!) 90/48, pulse 77, temperature 98.4 F (36.9 C), temperature source Oral, resp. rate 16, height 5\' 4"  (1.626 m), weight 120 lb 4 oz (54.5 kg), SpO2 98 %. Gen: Alert, well appearing.  Patient is oriented to person, place, time, and situation. AFFECT: pleasant, lucid thought and speech. No further exam today. She did require me to clarify instructions several times, which is a bit worse than her baseline.  LABS:  Lab Results  Component Value Date   TSH 1.55 11/29/2015   Lab Results  Component Value Date   WBC 8.7 07/11/2016   HGB 12.5 07/11/2016   HCT 36.2 07/11/2016   MCV 87.9 07/11/2016   PLT 146 (L) 07/11/2016   Lab Results  Component Value Date   VITAMINB12 680 02/24/2016   Lab Results  Component Value Date   IRON 86 11/29/2015   IRON 41 (L) 11/29/2015   TIBC 322 11/29/2015   FERRITIN 22.3 12/28/2015    Lab Results  Component Value Date   CREATININE 0.74 07/19/2017    BUN 18 07/19/2017   NA 132 (L) 07/19/2017   K 4.8 07/19/2017   CL 96 07/19/2017   CO2 32 07/19/2017   Lab Results  Component Value Date   ALT 12 06/29/2017   AST 17 06/29/2017   ALKPHOS 54 07/11/2016   BILITOT 0.4 06/29/2017   Lab Results  Component Value Date   CHOL 171 06/21/2015   Lab Results  Component Value Date   HDL 71 06/21/2015   Lab Results  Component Value Date   LDLCALC 95 06/21/2015   Lab Results  Component Value Date   TRIG 26 06/21/2015   Lab Results  Component Value Date   CHOLHDL 2.4 06/21/2015   Lab Results  Component Value Date   HGBA1C 5.8 (H) 05/30/2013    IMPRESSION AND PLAN:  1) HTN: low bp today, asymptomatic.  No home bp monitoring. Decrease losartan to just 50mg  qd. Stop amlodipine.  2) HLD: tolerating statin.  Continue simva 40mg  qd.  3) Urinary urge incontinence/overactive bladder: excessive dry mouth from both oxybutynin and detrol. Neither seems to help significantly from what I can tell. Stop detrol.    4) Cognitive impairment: question whether this is affecting her ability to take her meds correctly. ?Did this start getting worse coincidental with starting detrol recently. Also consider statin holiday.  5) Hx of hyperkalemia: ? ACE-I induced. Recheck BMET today.  6) Xerostomia: med side effect plus natural salivary gland atrophy of aging. I cannot even be sure she is taking her evoxac. I told her to ask her son if he could provide som oversight assistance with her meds.  An After Visit Summary was printed and given to the patient.  FOLLOW UP: 2 wks f/u bp and cognitive worsening  Signed:  Crissie Sickles, MD  10/07/2017     

## 2017-09-28 NOTE — Telephone Encounter (Signed)
I didn't see cholesterol check this year, please advise RF.

## 2017-10-11 ENCOUNTER — Encounter: Payer: Self-pay | Admitting: Family Medicine

## 2017-10-11 ENCOUNTER — Ambulatory Visit: Payer: Medicare Other | Admitting: Family Medicine

## 2017-10-11 ENCOUNTER — Ambulatory Visit (INDEPENDENT_AMBULATORY_CARE_PROVIDER_SITE_OTHER): Payer: Medicare Other | Admitting: Family Medicine

## 2017-10-11 VITALS — BP 122/70 | HR 67 | Temp 98.1°F | Resp 16 | Ht 64.0 in | Wt 120.2 lb

## 2017-10-11 DIAGNOSIS — N3001 Acute cystitis with hematuria: Secondary | ICD-10-CM

## 2017-10-11 DIAGNOSIS — R300 Dysuria: Secondary | ICD-10-CM

## 2017-10-11 DIAGNOSIS — N390 Urinary tract infection, site not specified: Secondary | ICD-10-CM | POA: Diagnosis not present

## 2017-10-11 DIAGNOSIS — R7989 Other specified abnormal findings of blood chemistry: Secondary | ICD-10-CM | POA: Diagnosis not present

## 2017-10-11 DIAGNOSIS — I10 Essential (primary) hypertension: Secondary | ICD-10-CM

## 2017-10-11 LAB — POCT URINALYSIS DIPSTICK
BILIRUBIN UA: NEGATIVE
GLUCOSE UA: NEGATIVE
Ketones, UA: NEGATIVE
Nitrite, UA: NEGATIVE
Protein, UA: 15
SPEC GRAV UA: 1.02 (ref 1.010–1.025)
Urobilinogen, UA: NEGATIVE E.U./dL — AB
pH, UA: 6 (ref 5.0–8.0)

## 2017-10-11 MED ORDER — CIPROFLOXACIN HCL 500 MG PO TABS: 500.0000 mg | ORAL_TABLET | Freq: Two times a day (BID) | ORAL | 0 refills | Status: AC

## 2017-10-11 NOTE — Progress Notes (Deleted)
OFFICE VISIT  10/11/2017   CC: No chief complaint on file.    HPI:    Patient is a 84 y.o. Caucasian female who presents for 2 week f/u HTN, dry mouth, gradually progressive cognitive impairment.  She had asymptomatic low bp at las o/v so I d/c'd her amlodipine and cut her losartan back to 50mg  qd. Creatinine was down compared to baseline so I recommended more aggressive fluid intake and we'll recheck BMET today. Home bp's:  Has chronic dry mouth.  MCI:    Past Medical History:  Diagnosis Date  . Abnormal finding on thyroid function test    per old records: T3 low, free T4 elevated, TSH normal.  [place] thyroid made her feel worse and T4 went up so med d/c'd and liothyronine 5 mcg tried but also made pt feel worse.  Endo (Dr. Loanne Drilling felt like Biotin was causing the abnormal TFT's).  Repeat TFTs OFF BIOTIN x 2 wks were NORMAL.  NO FURTHER THYROID TESTING NEEDED.  Marland Kitchen Anginal pain ([place])   . Arthritis    "think it's osteo; got some in my hands"  . Bifascicular block   . Bladder infection, chronic 10/03/11   "have had one for over 1 yr"--has been on trimethaprim 100 mg qd since 2014  . CAD (coronary artery disease)    Cath 2009  Occluded LAD, 40-50% RCA and circ managed medically.  Repeat 06/22/15 no change.  . Chronic constipation    slow transit.  Barium enema to eval for colonic stricture 10/2014 was NORMAL  . Cognitive dysfunction    short term memory loss--takes ginko biloba  . COPD (chronic obstructive pulmonary disease) ([place])    Changes noted on CXR 2017  . GERD (gastroesophageal reflux disease)    (also LPR) Schatzki's ring, s/p dil '97; food impaction 08/2010, on PPI therapy since w/out further sx so no dil performed  . History of herpes zoster 06/2015   right side of chest; presented with chest pain mimicking [place]--cardiac eval showed stable CAD compared to 2009.  Marland Kitchen Hyperkalemia 06/2017   suspected due to increase of losartan from 25mg  bid to 50mg  bid.  Dose lowered back to  25 mg bid K normalized.  After increase again to 50 qAM and 25 qPM potassium 4.8 so I did not increase dose any further (07/20/17).  . Hyperlipidemia   . Hypertension   . Hyponatremia 10/03/2011   Na 131 on labs 02/2015 at [facility].  Baseline Na 132.  . Iron deficiency anemia 11/30/15   Hemoccults neg x 3 12/2015.  ?Mild malabsorption?  . Mixed stress and urge urinary incontinence   . RBBB with left anterior fascicular block    bifascicular block  . Recurrent UTI    Trimethaprim 100 mg qd --prophylaxis  . 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 Thrombocytopenia ([place]) 02/2013   Plts 114K  . Uterine cancer ([place]) 2000  . Xerostomia 2017/2018   Age related glandular atrophy + med effect (oxybutynin and gabapentin).  ANA and sjogren's panel NEG 06/2017.    Past Surgical History:  Procedure Laterality Date  . ABDOMINAL HYSTERECTOMY  2000  . APPENDECTOMY  2000  . CARDIAC CATHETERIZATION N/A 06/22/2015   Stable single vessel CAD, EF normal.  Procedure: Left Heart Cath and Coronary Angiography;  Surgeon: Leonie Man, MD;  Location: [place] CV LAB;  Service: Cardiovascular;  Laterality: N/A;  . CATARACT EXTRACTION W/ INTRAOCULAR LENS  IMPLANT, BILATERAL  1990's  . [place]; 1960  . COLONOSCOPY  04/2001   Neg; pt cancelled screening colon 06/2011  . DILATION AND CURETTAGE OF UTERUS    . HERNIA REPAIR     abdominal "twice"  . LEFT HEART CATHETERIZATION WITH CORONARY ANGIOGRAM N/A 10/04/2011   Procedure: LEFT HEART CATHETERIZATION WITH CORONARY ANGIOGRAM;  Surgeon: Jacolyn Reedy, MD;  Location: [facility] CATH LAB;  Service: Cardiovascular;  Laterality: N/A;  . REPAIR KNEE LIGAMENT  ~ 2008   left  . TONSILLECTOMY     as a child  . TRANSTHORACIC ECHOCARDIOGRAM  05/2013   EF 60-65%, trivial aortic regurg, no wall motion abnorm    Outpatient Medications Prior to Visit   Medication Sig Dispense Refill  . acetaminophen (TYLENOL) 325 MG tablet Take 325 mg by mouth every 6 (six) hours as needed.    Marland Kitchen aspirin 325 MG tablet Take 1 tablet (325 mg total) by mouth daily.    . cevimeline (EVOXAC) 30 MG capsule TAKE 1 CAPSULE 3 TIMES A DAY FOR TREATMENT OF CHRONIC DRY MOUTH 270 capsule 3  . Cholecalciferol (VITAMIN D3) 1000 UNITS CAPS Take 1,000 Units by mouth daily.     Marland Kitchen co-enzyme Q-10 30 MG capsule Take 30 mg by mouth daily. Reported on 11/29/2015    . fluticasone (FLONASE) 50 MCG/ACT nasal spray Place 2 sprays into both nostrils daily. 16 g 6  . gabapentin (NEURONTIN) 100 MG capsule 1 tab po qAM, 1 tab po q afternoon, and 2 tabs po qhs 120 capsule 2  . losartan (COZAAR) 50 MG tablet Take 1 tablet (50 mg total) by mouth daily. 180 tablet 1  . nitroGLYCERIN (NITROSTAT) 0.4 MG SL tablet Place 1 tablet (0.4 mg total) under the tongue every 5 (five) minutes as needed for chest pain. 25 tablet 12  . Omega-3 Fatty Acids (FISH OIL) 1000 MG CAPS Take 1 capsule by mouth daily.    . pantoprazole (PROTONIX) 40 MG tablet Take 1 tablet (40 mg total) by mouth daily. 90 tablet 1  . polyethylene glycol powder (GLYCOLAX/MIRALAX) powder TAKE 17 G BY MOUTH 2 (TWO) TIMES DAILY. 527 g 5  . simvastatin (ZOCOR) 40 MG tablet TAKE 1 TABLET BY MOUTH EVERY DAY 90 tablet 1  . trimethoprim (TRIMPEX) 100 MG tablet TAKE 1 TABLET (100 MG TOTAL) BY MOUTH DAILY. 90 tablet 3   No facility-administered medications prior to visit.     Allergies  Allergen Reactions  . Macrobid [Nitrofurantoin Macrocrystal] Other (See Comments)    "I had chills & fever"    ROS As per HPI  PE: There were no vitals taken for this visit. ***  LABS:    Chemistry      Component Value Date/Time   NA 134 (L) 09/27/2017 1442   K 4.5 09/27/2017 1442   CL 100 09/27/2017 1442   CO2 28 09/27/2017 1442   BUN 20 09/27/2017 1442   CREATININE 0.97 09/27/2017 1442   CREATININE 0.83 06/29/2017 1425      Component  Value Date/Time   CALCIUM 8.6 09/27/2017 1442   ALKPHOS 54 07/11/2016 1900   AST 17 06/29/2017 1425   ALT 12 06/29/2017 1425   BILITOT 0.4 06/29/2017 1425     Lab Results  Component Value Date   WBC 8.7 07/11/2016   HGB 12.5 07/11/2016   HCT 36.2 07/11/2016   MCV 87.9 07/11/2016   PLT 146 (L) 07/11/2016  Lab Results  Component Value Date   QASUORVI15 379 02/24/2016   Lab Results  Component Value Date   HGBA1C 5.8 (H) 05/30/2013   IMPRESSION AND PLAN:  No problem-specific Assessment & Plan notes found for this encounter.   FOLLOW UP: No follow-ups on file.

## 2017-10-11 NOTE — Progress Notes (Signed)
OFFICE VISIT  10/11/2017   CC:  Chief Complaint  Patient presents with  . Follow-up    BP, Dry Mouth, cognition   HPI:    Patient is a 84 y.o. Caucasian female who presents for 2 wk f/u HTN. Last f/u her bp was low but she was asymptomatic. I stopped her amlodipine and cut her losartan back to 50mg  qd dosing.  Home bp's normal.  No HA's, no dizziness.  Has had dysuria recently--she is unable to really specify how long--2-3 days approx.  Some increased urgency/frequency.  No flank or abd pain.  No nausea.  No fevers. Says she took a dose of an antibiotic she had at home (not trimethaprim) this morning. The dysuria seems to only be with the first urination of the day.  Serum creatinine was elevated from her baseline last check 2 wks ago. She was encouraged to hydrate better.   Past Medical History:  Diagnosis Date  . Abnormal finding on thyroid function test    per old records: T3 low, free T4 elevated, TSH normal.  [place] thyroid made her feel worse and T4 went up so med d/c'd and liothyronine 5 mcg tried but also made pt feel worse.  Endo (Dr. Loanne Drilling felt like Biotin was causing the abnormal TFT's).  Repeat TFTs OFF BIOTIN x 2 wks were NORMAL.  NO FURTHER THYROID TESTING NEEDED.  Marland Kitchen Anginal pain ([place])   . Arthritis    "think it's osteo; got some in my hands"  . Bifascicular block   . Bladder infection, chronic 10/03/11   "have had one for over 1 yr"--has been on trimethaprim 100 mg qd since 2014  . CAD (coronary artery disease)    Cath 2009  Occluded LAD, 40-50% RCA and circ managed medically.  Repeat 06/22/15 no change.  . Chronic constipation    slow transit.  Barium enema to eval for colonic stricture 10/2014 was NORMAL  . Cognitive dysfunction    short term memory loss--takes ginko biloba  . COPD (chronic obstructive pulmonary disease) ([place])    Changes noted on CXR 2017  . GERD (gastroesophageal reflux disease)    (also LPR) Schatzki's ring, s/p dil '97; food impaction  08/2010, on PPI therapy since w/out further sx so no dil performed  . History of herpes zoster 06/2015   right side of chest; presented with chest pain mimicking [place]--cardiac eval showed stable CAD compared to 2009.  Marland Kitchen Hyperkalemia 06/2017   suspected due to increase of losartan from 25mg  bid to 50mg  bid.  Dose lowered back to 25 mg bid K normalized.  After increase again to 50 qAM and 25 qPM potassium 4.8 so I did not increase dose any further (07/20/17).  . Hyperlipidemia   . Hypertension   . Hyponatremia 10/03/2011   Na 131 on labs 02/2015 at [facility].  Baseline Na 132.  . Iron deficiency anemia 11/30/15   Hemoccults neg x 3 12/2015.  ?Mild malabsorption?  . Mixed stress and urge urinary incontinence   . RBBB with left anterior fascicular block    bifascicular block  . Recurrent UTI    Trimethaprim 100 mg qd --prophylaxis  . 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 Thrombocytopenia ([place]) 02/2013   Plts 114K  . Uterine cancer ([place]) 2000  . Xerostomia 2017/2018   Age related glandular atrophy + med effect (oxybutynin and gabapentin).  ANA and sjogren's panel NEG 06/2017.    Past Surgical History:  Procedure Laterality Date  . ABDOMINAL HYSTERECTOMY  2000  . APPENDECTOMY  2000  . CARDIAC CATHETERIZATION N/A 06/22/2015   Stable single vessel CAD, EF normal.  Procedure: Left Heart Cath and Coronary Angiography;  Surgeon: Leonie Man, MD;  Location: [place] CV LAB;  Service: Cardiovascular;  Laterality: N/A;  . CATARACT EXTRACTION W/ INTRAOCULAR LENS  IMPLANT, BILATERAL  1990's  . [place]; 1960  . COLONOSCOPY  04/2001   Neg; pt cancelled screening colon 06/2011  . DILATION AND CURETTAGE OF UTERUS    . HERNIA REPAIR     abdominal "twice"  . LEFT HEART CATHETERIZATION WITH CORONARY ANGIOGRAM N/A 10/04/2011   Procedure: LEFT HEART CATHETERIZATION WITH CORONARY  ANGIOGRAM;  Surgeon: Jacolyn Reedy, MD;  Location: [facility] CATH LAB;  Service: Cardiovascular;  Laterality: N/A;  . REPAIR KNEE LIGAMENT  ~ 2008   left  . TONSILLECTOMY     as a child  . TRANSTHORACIC ECHOCARDIOGRAM  05/2013   EF 60-65%, trivial aortic regurg, no wall motion abnorm    Outpatient Medications Prior to Visit  Medication Sig Dispense Refill  . acetaminophen (TYLENOL) 325 MG tablet Take 325 mg by mouth every 6 (six) hours as needed.    Marland Kitchen aspirin 325 MG tablet Take 1 tablet (325 mg total) by mouth daily.    . cevimeline (EVOXAC) 30 MG capsule TAKE 1 CAPSULE 3 TIMES A DAY FOR TREATMENT OF CHRONIC DRY MOUTH 270 capsule 3  . Cholecalciferol (VITAMIN D3) 1000 UNITS CAPS Take 1,000 Units by mouth daily.     . fluticasone (FLONASE) 50 MCG/ACT nasal spray Place 2 sprays into both nostrils daily. 16 g 6  . gabapentin (NEURONTIN) 100 MG capsule 1 tab po qAM, 1 tab po q afternoon, and 2 tabs po qhs 120 capsule 2  . losartan (COZAAR) 50 MG tablet Take 1 tablet (50 mg total) by mouth daily. 180 tablet 1  . nitroGLYCERIN (NITROSTAT) 0.4 MG SL tablet Place 1 tablet (0.4 mg total) under the tongue every 5 (five) minutes as needed for chest pain. 25 tablet 12  . Omega-3 Fatty Acids (FISH OIL) 1000 MG CAPS Take 1 capsule by mouth daily.    . pantoprazole (PROTONIX) 40 MG tablet Take 1 tablet (40 mg total) by mouth daily. 90 tablet 1  . polyethylene glycol powder (GLYCOLAX/MIRALAX) powder TAKE 17 G BY MOUTH 2 (TWO) TIMES DAILY. 527 g 5  . simvastatin (ZOCOR) 40 MG tablet TAKE 1 TABLET BY MOUTH EVERY DAY 90 tablet 1  . trimethoprim (TRIMPEX) 100 MG tablet TAKE 1 TABLET (100 MG TOTAL) BY MOUTH DAILY. 90 tablet 3  . co-enzyme Q-10 30 MG capsule Take 30 mg by mouth daily. Reported on 11/29/2015     No facility-administered medications prior to visit.     Allergies  Allergen Reactions  . Macrobid [Nitrofurantoin Macrocrystal] Other (See Comments)    "I had chills & fever"    ROS As per  HPI  PE: Blood pressure 122/70, pulse 67, temperature 98.1 F (36.7 C), temperature source Oral, resp. rate 16, height 5\' 4"  (1.626 m), weight 120 lb 4 oz (54.5 kg), SpO2 97 %. Gen: Alert, well appearing.  Patient is oriented to person, place, time, and situation. AFFECT: pleasant, lucid thought and speech. No further exam today.  LABS:   CC UA today: 3+ LEU, trace blood, otherwise normal.    Chemistry  Component Value Date/Time   NA 134 (L) 09/27/2017 1442   K 4.5 09/27/2017 1442   CL 100 09/27/2017 1442   CO2 28 09/27/2017 1442   BUN 20 09/27/2017 1442   CREATININE 0.97 09/27/2017 1442   CREATININE 0.83 06/29/2017 1425      Component Value Date/Time   CALCIUM 8.6 09/27/2017 1442   ALKPHOS 54 07/11/2016 1900   AST 17 06/29/2017 1425   ALT 12 06/29/2017 1425   BILITOT 0.4 06/29/2017 1425       IMPRESSION AND PLAN:  1) HTN: The current medical regimen is effective;  continue present plan and medication (losartan 50mg  once daily is the only med she should take for this).  2) Elevated sCr over pt's baseline: recheck BMET today to see if better hydration helped.  3) Recurrent UTI, sx's of UTI last 3d.   UA today abnl. Urine clx sent. Start cipro 500 mg bid x 5d.  An After Visit Summary was printed and given to the patient.  FOLLOW UP: Return in about 2 months (around 12/11/2017) for routine chronic illness f/u.  Signed:  Crissie Sickles, MD           10/11/2017

## 2017-10-12 LAB — BASIC METABOLIC PANEL
BUN: 20 mg/dL (ref 6–23)
CHLORIDE: 98 meq/L (ref 96–112)
CO2: 30 mEq/L (ref 19–32)
Calcium: 8.8 mg/dL (ref 8.4–10.5)
Creatinine, Ser: 0.75 mg/dL (ref 0.40–1.20)
GFR: 77.49 mL/min (ref >60.00)
Glucose, Bld: 101 mg/dL — ABNORMAL HIGH (ref 70–99)
Potassium: 4.8 mEq/L (ref 3.5–5.1)
SODIUM: 133 meq/L — AB (ref 135–145)

## 2017-10-13 LAB — URINE CULTURE
MICRO NUMBER:: 90567584
SPECIMEN QUALITY:: ADEQUATE

## 2017-10-15 ENCOUNTER — Ambulatory Visit: Payer: Medicare Other

## 2017-10-15 ENCOUNTER — Encounter: Payer: Self-pay | Admitting: Family Medicine

## 2017-10-15 ENCOUNTER — Ambulatory Visit (INDEPENDENT_AMBULATORY_CARE_PROVIDER_SITE_OTHER): Payer: Medicare Other | Admitting: Family Medicine

## 2017-10-15 VITALS — BP 160/76 | HR 62 | Temp 98.0°F | Resp 16 | Ht 64.0 in | Wt 120.0 lb

## 2017-10-15 DIAGNOSIS — I10 Essential (primary) hypertension: Secondary | ICD-10-CM | POA: Diagnosis not present

## 2017-10-15 MED ORDER — AMLODIPINE BESYLATE 5 MG PO TABS: 5.0000 mg | ORAL_TABLET | Freq: Every day | ORAL | 3 refills | Status: DC

## 2017-10-15 NOTE — Progress Notes (Signed)
OFFICE VISIT  10/15/2017   CC:  Chief Complaint  Patient presents with  . Walk-In Triage    elevated BP     HPI:    Patient is a 84 y.o. Caucasian female who presents as a walk-in today asking for bp to be checked. Says she checked it at home this afternoon and it was 160 systolic. She had been sitting in her chair at home and felt hot and this triggered her to take bp.  Felt hot in bed last night but didn't check bp.  Sounds like the symptom lasted 5 min or so.   She is compliant with her meds.  ROS: no HA, palpitations, SOB, CP, palpitations, dizziness, vision changes, focal weakness, or tremors. No excessive anxiety, no pain.  She has recently been on cipro for a UTI but has never had any side effect from this med in the past.   Past Medical History:  Diagnosis Date  . Abnormal finding on thyroid function test    per old records: T3 low, free T4 elevated, TSH normal.  [place] thyroid made her feel worse and T4 went up so med d/c'd and liothyronine 5 mcg tried but also made pt feel worse.  Endo (Dr. Loanne Drilling felt like Biotin was causing the abnormal TFT's).  Repeat TFTs OFF BIOTIN x 2 wks were NORMAL.  NO FURTHER THYROID TESTING NEEDED.  Marland Kitchen Anginal pain ([place])   . Arthritis    "think it's osteo; got some in my hands"  . Bifascicular block   . Bladder infection, chronic 10/03/11   "have had one for over 1 yr"--has been on trimethaprim 100 mg qd since 2014  . CAD (coronary artery disease)    Cath 2009  Occluded LAD, 40-50% RCA and circ managed medically.  Repeat 06/22/15 no change.  . Chronic constipation    slow transit.  Barium enema to eval for colonic stricture 10/2014 was NORMAL  . Cognitive dysfunction    short term memory loss--takes ginko biloba  . COPD (chronic obstructive pulmonary disease) ([place])    Changes noted on CXR 2017  . GERD (gastroesophageal reflux disease)    (also LPR) Schatzki's ring, s/p dil '97; food impaction 08/2010, on PPI therapy since w/out further sx  so no dil performed  . History of herpes zoster 06/2015   right side of chest; presented with chest pain mimicking [place]--cardiac eval showed stable CAD compared to 2009.  Marland Kitchen Hyperkalemia 06/2017   suspected due to increase of losartan from 25mg  bid to 50mg  bid.  Dose lowered back to 25 mg bid K normalized.  After increase again to 50 qAM and 25 qPM potassium 4.8 so I did not increase dose any further (07/20/17).  . Hyperlipidemia   . Hypertension   . Hyponatremia 10/03/2011   Na 131 on labs 02/2015 at [facility].  Baseline Na 132.  . Iron deficiency anemia 11/30/15   Hemoccults neg x 3 12/2015.  ?Mild malabsorption?  . Mixed stress and urge urinary incontinence   . RBBB with left anterior fascicular block    bifascicular block  . Recurrent UTI    Trimethaprim 100 mg qd --prophylaxis  . 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 Thrombocytopenia ([place]) 02/2013   Plts 114K  . Uterine cancer ([place]) 2000  . Xerostomia 2017/2018   Age related glandular atrophy + med effect (oxybutynin and gabapentin).  ANA and sjogren's panel NEG 06/2017.    Past Surgical History:  Procedure Laterality Date  . ABDOMINAL HYSTERECTOMY  2000  . APPENDECTOMY  2000  . CARDIAC CATHETERIZATION N/A 06/22/2015   Stable single vessel CAD, EF normal.  Procedure: Left Heart Cath and Coronary Angiography;  Surgeon: Leonie Man, MD;  Location: [place] CV LAB;  Service: Cardiovascular;  Laterality: N/A;  . CATARACT EXTRACTION W/ INTRAOCULAR LENS  IMPLANT, BILATERAL  1990's  . [place]; 1960  . COLONOSCOPY  04/2001   Neg; pt cancelled screening colon 06/2011  . DILATION AND CURETTAGE OF UTERUS    . HERNIA REPAIR     abdominal "twice"  . LEFT HEART CATHETERIZATION WITH CORONARY ANGIOGRAM N/A 10/04/2011   Procedure: LEFT HEART CATHETERIZATION WITH CORONARY ANGIOGRAM;  Surgeon: Jacolyn Reedy, MD;   Location: [facility] CATH LAB;  Service: Cardiovascular;  Laterality: N/A;  . REPAIR KNEE LIGAMENT  ~ 2008   left  . TONSILLECTOMY     as a child  . TRANSTHORACIC ECHOCARDIOGRAM  05/2013   EF 60-65%, trivial aortic regurg, no wall motion abnorm    Outpatient Medications Prior to Visit  Medication Sig Dispense Refill  . acetaminophen (TYLENOL) 325 MG tablet Take 325 mg by mouth every 6 (six) hours as needed.    Marland Kitchen aspirin 325 MG tablet Take 1 tablet (325 mg total) by mouth daily.    . cevimeline (EVOXAC) 30 MG capsule TAKE 1 CAPSULE 3 TIMES A DAY FOR TREATMENT OF CHRONIC DRY MOUTH 270 capsule 3  . Cholecalciferol (VITAMIN D3) 1000 UNITS CAPS Take 1,000 Units by mouth daily.     . ciprofloxacin (CIPRO) 500 MG tablet Take 1 tablet (500 mg total) by mouth 2 (two) times daily for 5 days. 10 tablet 0  . fluticasone (FLONASE) 50 MCG/ACT nasal spray Place 2 sprays into both nostrils daily. 16 g 6  . gabapentin (NEURONTIN) 100 MG capsule 1 tab po qAM, 1 tab po q afternoon, and 2 tabs po qhs 120 capsule 2  . losartan (COZAAR) 50 MG tablet Take 1 tablet (50 mg total) by mouth daily. 180 tablet 1  . nitroGLYCERIN (NITROSTAT) 0.4 MG SL tablet Place 1 tablet (0.4 mg total) under the tongue every 5 (five) minutes as needed for chest pain. 25 tablet 12  . Omega-3 Fatty Acids (FISH OIL) 1000 MG CAPS Take 1 capsule by mouth daily.    . pantoprazole (PROTONIX) 40 MG tablet Take 1 tablet (40 mg total) by mouth daily. 90 tablet 1  . polyethylene glycol powder (GLYCOLAX/MIRALAX) powder TAKE 17 G BY MOUTH 2 (TWO) TIMES DAILY. 527 g 5  . simvastatin (ZOCOR) 40 MG tablet TAKE 1 TABLET BY MOUTH EVERY DAY 90 tablet 1  . trimethoprim (TRIMPEX) 100 MG tablet TAKE 1 TABLET (100 MG TOTAL) BY MOUTH DAILY. 90 tablet 3   No facility-administered medications prior to visit.     Allergies  Allergen Reactions  . Macrobid [Nitrofurantoin Macrocrystal] Other (See Comments)    "I had chills & fever"    ROS As per HPI  PE: BP  160/72 Blood pressure (!) 160/76, pulse 62, temperature 98 F (36.7 C), temperature source Oral, resp. rate 16, height 5\' 4"  (1.626 m), weight 120 lb (54.4 kg), SpO2 99 %. Gen: Alert, well appearing.  Patient is oriented to person, place, time, and situation. AFFECT: pleasant, lucid thought and speech. CV: RRR, no m/r/g.   LUNGS: CTA bilat, nonlabored resps, good aeration in all lung  fields. EXT: no clubbing, cyanosis, or edema.  Neuro: no focal deficits.  LABS:  None today.  IMPRESSION AND PLAN:  Elevated bp in pt with longstanding HTN. We recently cut back her meds (stopped amlod, decreased losartan dose) due to very good bp's. We'll restart amlodipine 5mg  qd and continue only a 50 mg qd dose of losartan.  An After Visit Summary was printed and given to the patient.  FOLLOW UP: Return in about 10 days (around 10/25/2017) for f/u HTN.  Signed:  Crissie Sickles, MD           10/15/2017

## 2017-10-31 ENCOUNTER — Ambulatory Visit (INDEPENDENT_AMBULATORY_CARE_PROVIDER_SITE_OTHER): Payer: Medicare Other | Admitting: Family Medicine

## 2017-10-31 ENCOUNTER — Encounter: Payer: Self-pay | Admitting: Family Medicine

## 2017-10-31 VITALS — BP 119/57 | HR 84 | Temp 98.3°F | Resp 16 | Ht 64.0 in | Wt 123.1 lb

## 2017-10-31 DIAGNOSIS — N3941 Urge incontinence: Secondary | ICD-10-CM

## 2017-10-31 DIAGNOSIS — R682 Dry mouth, unspecified: Secondary | ICD-10-CM | POA: Diagnosis not present

## 2017-10-31 DIAGNOSIS — K117 Disturbances of salivary secretion: Secondary | ICD-10-CM

## 2017-10-31 DIAGNOSIS — I10 Essential (primary) hypertension: Secondary | ICD-10-CM | POA: Diagnosis not present

## 2017-10-31 MED ORDER — SOLIFENACIN SUCCINATE 5 MG PO TABS: 5.0000 mg | ORAL_TABLET | Freq: Every day | ORAL | 3 refills | Status: DC

## 2017-10-31 NOTE — Patient Instructions (Addendum)
Do not take any more oxybutynin. I sent in a new med for your incontinence called vesicare to start now.  Buy over the counter mouth rinse called Biotene to help with your dry mouth---follow directions on the bottle.

## 2017-10-31 NOTE — Progress Notes (Signed)
OFFICE VISIT  10/31/2017   CC:  Chief Complaint  Patient presents with  . Follow-up    HTN   HPI:    Patient is a 84 y.o. Caucasian female who presents for 2 week f/u uncontrolled HTN. Last visit we restarted amlodipine 5mg  qd and continued only a 50 mg qd dose of losartan.   Not long before this, we had decided to cut back her meds (stopped amlod, decreased losartan dose) due to very good bp's. 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 She has not been checking her bp's any.  She has not had any hot/cold sweats or dizziness.  Also has hx of recurrent UTI--sometimes with very minimal sx's.  On 10/11/17 she had abnormal UA, grew pansensitive e coli that I treated with cephalexin 500 mg tid x 3d.  Chronic urinary c/o: urinary urgency and frequency.  Trials of several anticholinergics have had minimal success and have all caused dry mouth.  We have even tried some twice.  Past Medical History:  Diagnosis Date  . Abnormal finding on thyroid function test    per old records: T3 low, free T4 elevated, TSH normal.  Armour thyroid made her feel worse and T4 went up so med d/c'd and liothyronine 5 mcg tried but also made pt feel worse.  Endo (Dr. Loanne Drilling felt like Biotin was causing the abnormal TFT's).  Repeat TFTs OFF BIOTIN x 2 wks were NORMAL.  NO FURTHER THYROID TESTING NEEDED.  Marland Kitchen Anginal pain ([place])   . Arthritis    "think it's osteo; got some in my hands"  . Bifascicular block   . Bladder infection, chronic 10/03/11   "have had one for over 1 yr"--has been on trimethaprim 100 mg qd since 2014  . CAD (coronary artery disease)    Cath 2009  Occluded LAD, 40-50% RCA and circ managed medically.  Repeat 06/22/15 no change.  . Chronic constipation    slow transit.  Barium enema to eval for colonic stricture 10/2014 was NORMAL  . Cognitive dysfunction    short term memory loss--takes ginko biloba  . COPD (chronic obstructive  pulmonary disease) ([place])    Changes noted on CXR 2017  . GERD (gastroesophageal reflux disease)    (also LPR) Schatzki's ring, s/p dil '97; food impaction 08/2010, on PPI therapy since w/out further sx so no dil performed  . History of herpes zoster 06/2015   right side of chest; presented with chest pain mimicking [place]--cardiac eval showed stable CAD compared to 2009.  Marland Kitchen Hyperkalemia 06/2017   suspected due to increase of losartan from 25mg  bid to 50mg  bid.  Dose lowered back to 25 mg bid K normalized.  After increase again to 50 qAM and 25 qPM potassium 4.8 so I did not increase dose any further (07/20/17).  . Hyperlipidemia   . Hypertension   . Hyponatremia 10/03/2011   Na 131 on labs 02/2015 at [facility].  Baseline Na 132.  . Iron deficiency anemia 11/30/15   Hemoccults neg x 3 12/2015.  ?Mild malabsorption?  . Mixed stress and urge urinary incontinence   . RBBB with left anterior fascicular block    bifascicular block  . Recurrent UTI    Trimethaprim 100 mg qd --prophylaxis  . 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 Thrombocytopenia ([place])  02/2013   Plts 114K  . Uterine cancer ([place]) 2000  . Xerostomia 2017/2018   Age related glandular atrophy + med effect (oxybutynin and gabapentin).  ANA and sjogren's panel NEG 06/2017.    Past Surgical History:  Procedure Laterality Date  . ABDOMINAL HYSTERECTOMY  2000  . APPENDECTOMY  2000  . CARDIAC CATHETERIZATION N/A 06/22/2015   Stable single vessel CAD, EF normal.  Procedure: Left Heart Cath and Coronary Angiography;  Surgeon: Leonie Man, MD;  Location: [place] [facility];  Service: Cardiovascular;  Laterality: N/A;  . CATARACT EXTRACTION W/ INTRAOCULAR LENS  IMPLANT, BILATERAL  1990's  . [place]; 1960  . COLONOSCOPY  04/2001   Neg; pt cancelled screening colon 06/2011  . DILATION AND CURETTAGE OF UTERUS    . HERNIA  REPAIR     abdominal "twice"  . LEFT HEART CATHETERIZATION WITH CORONARY ANGIOGRAM N/A 10/04/2011   Procedure: LEFT HEART CATHETERIZATION WITH CORONARY ANGIOGRAM;  Surgeon: Jacolyn Reedy, MD;  Location: [facility] CATH LAB;  Service: Cardiovascular;  Laterality: N/A;  . REPAIR KNEE LIGAMENT  ~ 2008   left  . TONSILLECTOMY     as a child  . TRANSTHORACIC ECHOCARDIOGRAM  05/2013   EF 60-65%, trivial aortic regurg, no wall motion abnorm    Outpatient Medications Prior to Visit  Medication Sig Dispense Refill  . acetaminophen (TYLENOL) 325 MG tablet Take 325 mg by mouth every 6 (six) hours as needed.    Marland Kitchen amLODipine (NORVASC) 5 MG tablet Take 1 tablet (5 mg total) by mouth daily. 90 tablet 3  . aspirin 325 MG tablet Take 1 tablet (325 mg total) by mouth daily.    . cevimeline (EVOXAC) 30 MG capsule TAKE 1 CAPSULE 3 TIMES A DAY FOR TREATMENT OF CHRONIC DRY MOUTH 270 capsule 3  . Cholecalciferol (VITAMIN D3) 1000 UNITS CAPS Take 1,000 Units by mouth daily.     . fluticasone (FLONASE) 50 MCG/ACT nasal spray Place 2 sprays into both nostrils daily. 16 g 6  . gabapentin (NEURONTIN) 100 MG capsule 1 tab po qAM, 1 tab po q afternoon, and 2 tabs po qhs 120 capsule 2  . losartan (COZAAR) 50 MG tablet Take 1 tablet (50 mg total) by mouth daily. 180 tablet 1  . nitroGLYCERIN (NITROSTAT) 0.4 MG SL tablet Place 1 tablet (0.4 mg total) under the tongue every 5 (five) minutes as needed for chest pain. 25 tablet 12  . Omega-3 Fatty Acids (FISH OIL) 1000 MG CAPS Take 1 capsule by mouth daily.    . pantoprazole (PROTONIX) 40 MG tablet Take 1 tablet (40 mg total) by mouth daily. 90 tablet 1  . polyethylene glycol powder (GLYCOLAX/MIRALAX) powder TAKE 17 G BY MOUTH 2 (TWO) TIMES DAILY. 527 g 5  . simvastatin (ZOCOR) 40 MG tablet TAKE 1 TABLET BY MOUTH EVERY DAY 90 tablet 1  . trimethoprim (TRIMPEX) 100 MG tablet TAKE 1 TABLET (100 MG TOTAL) BY MOUTH DAILY. 90 tablet 3   No facility-administered medications prior to  visit.     Allergies  Allergen Reactions  . Macrobid [Nitrofurantoin Macrocrystal] Other (See Comments)    "I had chills & fever"    ROS As per HPI  PE: Blood pressure (!) 119/57, pulse 84, temperature 98.3 F (36.8 C), temperature source Oral, resp. rate 16, height 5\' 4"  (1.626 m), weight 123 lb 2 oz (55.8 kg), SpO2 96 %. Gen: Alert, well appearing.  Patient is oriented to person, place, time, and situation.  AFFECT: pleasant, lucid thought and speech. No further exam today.  LABS:    Chemistry      Component Value Date/Time   NA 133 (L) 10/11/2017 1515   K 4.8 10/11/2017 1515   CL 98 10/11/2017 1515   CO2 30 10/11/2017 1515   BUN 20 10/11/2017 1515   CREATININE 0.75 10/11/2017 1515   CREATININE 0.83 06/29/2017 1425      Component Value Date/Time   CALCIUM 8.8 10/11/2017 1515   ALKPHOS 54 07/11/2016 1900   AST 17 06/29/2017 1425   ALT 12 06/29/2017 1425   BILITOT 0.4 06/29/2017 1425       IMPRESSION AND PLAN:  1) HTN: well controlled now on amlodipine 5mg  qd and losartan 50mg  qd.  2) Urge incontinence: we have really had trouble treating this, as she seems to contradict herself every follow up in regard to whether a certain med works or not.  This has led to [place] switching back and forth on meds. She has tried ditropan and detrol at different doses and in the remote past tried vesicare.  She doesn't recall trying vesicare and I don't recall if she actually took this med or if she could not affort it.  At any rate, I rx'd it back in 2017. We decided to try vesicare 5mg  qd today.  She stated that if it is expensive then she won't get it. Our last resort is mirbetriq due to cost--but I think this one has the least incidence of xerostomia---which she often discontinues her urge incont med for.  3) Xerostomia: med side effect + natural physiologic atrophy of salivary glands. Continue evoxac and try otc biotene mouth rinse.  An After Visit Summary was printed and given to  the patient.  FOLLOW UP: Return for keep appt already set for 12/14/17.  Signed:  Crissie Sickles, MD           10/31/2017

## 2017-11-08 DIAGNOSIS — N3946 Mixed incontinence: Secondary | ICD-10-CM | POA: Diagnosis not present

## 2017-11-08 DIAGNOSIS — N3020 Other chronic cystitis without hematuria: Secondary | ICD-10-CM | POA: Diagnosis not present

## 2017-11-15 ENCOUNTER — Encounter: Payer: Self-pay | Admitting: Family Medicine

## 2017-11-15 ENCOUNTER — Ambulatory Visit (INDEPENDENT_AMBULATORY_CARE_PROVIDER_SITE_OTHER): Payer: Medicare Other | Admitting: Family Medicine

## 2017-11-15 VITALS — BP 109/56 | HR 60 | Temp 98.4°F | Resp 16 | Ht 64.0 in | Wt 122.5 lb

## 2017-11-15 DIAGNOSIS — T50905D Adverse effect of unspecified drugs, medicaments and biological substances, subsequent encounter: Secondary | ICD-10-CM

## 2017-11-15 DIAGNOSIS — R682 Dry mouth, unspecified: Secondary | ICD-10-CM

## 2017-11-15 DIAGNOSIS — N3941 Urge incontinence: Secondary | ICD-10-CM

## 2017-11-15 DIAGNOSIS — K117 Disturbances of salivary secretion: Secondary | ICD-10-CM

## 2017-11-15 NOTE — Patient Instructions (Signed)
Take your urine medication (oxybutynin 5mg  twice per day) EVERY OTHER DAY.  Take all of your other current medications as you have been taking them. We arranged for a refill of your medicine to help with dry mouth (Evoxac) today.

## 2017-11-15 NOTE — Progress Notes (Signed)
OFFICE VISIT  11/15/2017   CC:  Chief Complaint  Patient presents with  . Sore Throat   HPI:    Patient is a 84 y.o. Caucasian female who presents for sore throat. Says mouth hurts, onset seems to have been last evening. She has been out of her ecoxac for a few days.  Also she went to her urologist last week and he restarted her on oxybutynin 5 mg bid.  Still has good days and bad days regarding urge incontinence while on this med. No allergy or URI sx's lately.  Has been using biotene and finds it helpful short term for dry mouth.  Past Medical History:  Diagnosis Date  . Abnormal finding on thyroid function test    per old records: T3 low, free T4 elevated, TSH normal.  Armour thyroid made her feel worse and T4 went up so med d/c'd and liothyronine 5 mcg tried but also made pt feel worse.  Endo (Dr. Loanne Drilling felt like Biotin was causing the abnormal TFT's).  Repeat TFTs OFF BIOTIN x 2 wks were NORMAL.  NO FURTHER THYROID TESTING NEEDED.  Marland Kitchen Anginal pain ([place])   . Arthritis    "think it's osteo; got some in my hands"  . Bifascicular block   . Bladder infection, chronic 10/03/11   "have had one for over 1 yr"--has been on trimethaprim 100 mg qd since 2014  . CAD (coronary artery disease)    Cath 2009  Occluded LAD, 40-50% RCA and circ managed medically.  Repeat 06/22/15 no change.  . Chronic constipation    slow transit.  Barium enema to eval for colonic stricture 10/2014 was NORMAL  . Cognitive dysfunction    short term memory loss--takes ginko biloba  . COPD (chronic obstructive pulmonary disease) ([place])    Changes noted on CXR 2017  . GERD (gastroesophageal reflux disease)    (also LPR) Schatzki's ring, s/p dil '97; food impaction 08/2010, on PPI therapy since w/out further sx so no dil performed  . History of herpes zoster 06/2015   right side of chest; presented with chest pain mimicking [place]--cardiac eval showed stable CAD compared to 2009.  Marland Kitchen Hyperkalemia 06/2017   suspected due  to increase of losartan from 25mg  bid to 50mg  bid.  Dose lowered back to 25 mg bid K normalized.  After increase again to 50 qAM and 25 qPM potassium 4.8 so I did not increase dose any further (07/20/17).  . Hyperlipidemia   . Hypertension   . Hyponatremia 10/03/2011   Na 131 on labs 02/2015 at [facility].  Baseline Na 132.  . Iron deficiency anemia 11/30/15   Hemoccults neg x 3 12/2015.  ?Mild malabsorption?  . Mixed stress and urge urinary incontinence   . RBBB with left anterior fascicular block    bifascicular block  . Recurrent UTI    Trimethaprim 100 mg qd --prophylaxis  . 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 Thrombocytopenia ([place]) 02/2013   Plts 114K  . Uterine cancer ([place]) 2000  . Xerostomia 2017/2018   Age related glandular atrophy + med effect (oxybutynin and gabapentin).  ANA and sjogren's panel NEG 06/2017.    Past Surgical History:  Procedure Laterality Date  . ABDOMINAL HYSTERECTOMY  2000  . APPENDECTOMY  2000  . CARDIAC CATHETERIZATION N/A 06/22/2015   Stable single vessel CAD, EF normal.  Procedure: Left  Heart Cath and Coronary Angiography;  Surgeon: Leonie Man, MD;  Location: [place] [facility];  Service: Cardiovascular;  Laterality: N/A;  . CATARACT EXTRACTION W/ INTRAOCULAR LENS  IMPLANT, BILATERAL  1990's  . [place]; 1960  . COLONOSCOPY  04/2001   Neg; pt cancelled screening colon 06/2011  . DILATION AND CURETTAGE OF UTERUS    . HERNIA REPAIR     abdominal "twice"  . LEFT HEART CATHETERIZATION WITH CORONARY ANGIOGRAM N/A 10/04/2011   Procedure: LEFT HEART CATHETERIZATION WITH CORONARY ANGIOGRAM;  Surgeon: Jacolyn Reedy, MD;  Location: [facility] CATH LAB;  Service: Cardiovascular;  Laterality: N/A;  . REPAIR KNEE LIGAMENT  ~ 2008   left  . TONSILLECTOMY     as a child  . TRANSTHORACIC ECHOCARDIOGRAM  05/2013   EF 60-65%, trivial aortic  regurg, no wall motion abnorm    Outpatient Medications Prior to Visit  Medication Sig Dispense Refill  . acetaminophen (TYLENOL) 325 MG tablet Take 325 mg by mouth every 6 (six) hours as needed.    Marland Kitchen amLODipine (NORVASC) 5 MG tablet Take 1 tablet (5 mg total) by mouth daily. 90 tablet 3  . aspirin 325 MG tablet Take 1 tablet (325 mg total) by mouth daily.    . cevimeline (EVOXAC) 30 MG capsule TAKE 1 CAPSULE 3 TIMES A DAY FOR TREATMENT OF CHRONIC DRY MOUTH 270 capsule 3  . Cholecalciferol (VITAMIN D3) 1000 UNITS CAPS Take 1,000 Units by mouth daily.     . fluticasone (FLONASE) 50 MCG/ACT nasal spray Place 2 sprays into both nostrils daily. 16 g 6  . losartan (COZAAR) 50 MG tablet Take 1 tablet (50 mg total) by mouth daily. 180 tablet 1  . nitroGLYCERIN (NITROSTAT) 0.4 MG SL tablet Place 1 tablet (0.4 mg total) under the tongue every 5 (five) minutes as needed for chest pain. 25 tablet 12  . Omega-3 Fatty Acids (FISH OIL) 1000 MG CAPS Take 1 capsule by mouth daily.    Marland Kitchen oxybutynin (DITROPAN) 5 MG tablet Take 5 mg by mouth 2 (two) times daily.  0  . pantoprazole (PROTONIX) 40 MG tablet Take 1 tablet (40 mg total) by mouth daily. 90 tablet 1  . polyethylene glycol powder (GLYCOLAX/MIRALAX) powder TAKE 17 G BY MOUTH 2 (TWO) TIMES DAILY. 527 g 5  . simvastatin (ZOCOR) 40 MG tablet TAKE 1 TABLET BY MOUTH EVERY DAY 90 tablet 1  . trimethoprim (TRIMPEX) 100 MG tablet TAKE 1 TABLET (100 MG TOTAL) BY MOUTH DAILY. 90 tablet 3  . gabapentin (NEURONTIN) 100 MG capsule 1 tab po qAM, 1 tab po q afternoon, and 2 tabs po qhs (Patient not taking: Reported on 11/15/2017) 120 capsule 2  . solifenacin (VESICARE) 5 MG tablet Take 1 tablet (5 mg total) by mouth daily. (Patient not taking: Reported on 11/15/2017) 30 tablet 3   No facility-administered medications prior to visit.     Allergies  Allergen Reactions  . Macrobid [Nitrofurantoin Macrocrystal] Other (See Comments)    "I had chills & fever"    ROS As  per HPI  PE: Blood pressure (!) 109/56, pulse 60, temperature 98.4 F (36.9 C), temperature source Oral, resp. rate 16, height 5\' 4"  (1.626 m), weight 122 lb 8 oz (55.6 kg), SpO2 97 %. Gen: Alert, well appearing.  Patient is oriented to person, place, time, and situation. AFFECT: pleasant, lucid thought and speech. Mouth: pretty good moisture.  Small ulceration in buccal mucosa adjacent to rear-most R mandibular molar, no  bleeding.  Otherwise no focal lesions, no swelling, no erythema, no exudate.  LABS:    Chemistry      Component Value Date/Time   NA 133 (L) 10/11/2017 1515   K 4.8 10/11/2017 1515   CL 98 10/11/2017 1515   CO2 30 10/11/2017 1515   BUN 20 10/11/2017 1515   CREATININE 0.75 10/11/2017 1515   CREATININE 0.83 06/29/2017 1425      Component Value Date/Time   CALCIUM 8.8 10/11/2017 1515   ALKPHOS 54 07/11/2016 1900   AST 17 06/29/2017 1425   ALT 12 06/29/2017 1425   BILITOT 0.4 06/29/2017 1425      IMPRESSION AND PLAN:  This is a recurring problem for her, partially complicated by her memory problems:  1) Urge incontinence: mild improvement with various anticholinergics (currently oxybutynin), but significant dry mouth side effect.  I recommended she try taking her oxybutynin EVERY OTHER day. Additionally, dry mouth problem compounded recently after a restart of her ditropan combined with stopping her evoxac.  We have straitened this out with her pharmacy and she'll go to pick this med up now--continue taking this tid.  An After Visit Summary was printed and given to the patient.  FOLLOW UP: Return if symptoms worsen or fail to improve.  Signed:  Crissie Sickles, MD           11/15/2017

## 2017-11-28 ENCOUNTER — Ambulatory Visit (INDEPENDENT_AMBULATORY_CARE_PROVIDER_SITE_OTHER): Payer: Medicare Other | Admitting: Family Medicine

## 2017-11-28 ENCOUNTER — Encounter: Payer: Self-pay | Admitting: Family Medicine

## 2017-11-28 VITALS — BP 156/72 | HR 62 | Temp 98.7°F | Resp 16 | Ht 64.0 in | Wt 122.0 lb

## 2017-11-28 DIAGNOSIS — R300 Dysuria: Secondary | ICD-10-CM | POA: Diagnosis not present

## 2017-11-28 DIAGNOSIS — N390 Urinary tract infection, site not specified: Secondary | ICD-10-CM

## 2017-11-28 DIAGNOSIS — N3001 Acute cystitis with hematuria: Secondary | ICD-10-CM | POA: Diagnosis not present

## 2017-11-28 LAB — POC URINALSYSI DIPSTICK (AUTOMATED)
Bilirubin, UA: NEGATIVE
Glucose, UA: NEGATIVE
Ketones, UA: NEGATIVE
NITRITE UA: NEGATIVE
PROTEIN UA: POSITIVE — AB
Spec Grav, UA: 1.015 (ref 1.010–1.025)
UROBILINOGEN UA: 0.2 U/dL
pH, UA: 6 (ref 5.0–8.0)

## 2017-11-28 MED ORDER — CEFDINIR 125 MG/5ML PO SUSR: ORAL | 6 refills | Status: DC

## 2017-11-28 MED ORDER — CEFDINIR 300 MG PO CAPS: 300.0000 mg | ORAL_CAPSULE | Freq: Two times a day (BID) | ORAL | 0 refills | Status: DC

## 2017-11-28 NOTE — Progress Notes (Signed)
OFFICE VISIT  11/28/2017   CC:  Chief Complaint  Patient presents with  . Urinary Tract Infection    ?   HPI:    Patient is a 84 y.o. Caucasian female who presents for concern of possible UTI. Most recent UTI was 10/11/17--e coli, pan-sensitive. She has been on trimethaprim daily for UTI prophylaxis for quite some time. No nausea, no fever, no flank pain.  Onset of burning with urination x 2d.  Urgency, frequency.  Oxybutinin qod helping.  Past Medical History:  Diagnosis Date  . Abnormal finding on thyroid function test    per old records: T3 low, free T4 elevated, TSH normal.  Armour thyroid made her feel worse and T4 went up so med d/c'd and liothyronine 5 mcg tried but also made pt feel worse.  Endo (Dr. Loanne Drilling felt like Biotin was causing the abnormal TFT's).  Repeat TFTs OFF BIOTIN x 2 wks were NORMAL.  NO FURTHER THYROID TESTING NEEDED.  Marland Kitchen Anginal pain ([place])   . Arthritis    "think it's osteo; got some in my hands"  . Bifascicular block   . Bladder infection, chronic 10/03/11   "have had one for over 1 yr"--has been on trimethaprim 100 mg qd since 2014  . CAD (coronary artery disease)    Cath 2009  Occluded LAD, 40-50% RCA and circ managed medically.  Repeat 06/22/15 no change.  . Chronic constipation    slow transit.  Barium enema to eval for colonic stricture 10/2014 was NORMAL  . Cognitive dysfunction    short term memory loss--takes ginko biloba  . COPD (chronic obstructive pulmonary disease) ([place])    Changes noted on CXR 2017  . GERD (gastroesophageal reflux disease)    (also LPR) Schatzki's ring, s/p dil '97; food impaction 08/2010, on PPI therapy since w/out further sx so no dil performed  . History of herpes zoster 06/2015   right side of chest; presented with chest pain mimicking [place]--cardiac eval showed stable CAD compared to 2009.  Marland Kitchen Hyperkalemia 06/2017   suspected due to increase of losartan from 25mg  bid to 50mg  bid.  Dose lowered back to 25 mg bid K  normalized.  After increase again to 50 qAM and 25 qPM potassium 4.8 so I did not increase dose any further (07/20/17).  . Hyperlipidemia   . Hypertension   . Hyponatremia 10/03/2011   Na 131 on labs 02/2015 at [facility].  Baseline Na 132.  . Iron deficiency anemia 11/30/15   Hemoccults neg x 3 12/2015.  ?Mild malabsorption?  . Mixed stress and urge urinary incontinence   . RBBB with left anterior fascicular block    bifascicular block  . Recurrent UTI    Trimethaprim 100 mg qd --prophylaxis  . 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 Thrombocytopenia ([place]) 02/2013   Plts 114K  . Uterine cancer ([place]) 2000  . Xerostomia 2017/2018   Age related glandular atrophy + med effect (oxybutynin and gabapentin).  ANA and sjogren's panel NEG 06/2017.    Past Surgical History:  Procedure Laterality Date  . ABDOMINAL HYSTERECTOMY  2000  . APPENDECTOMY  2000  . CARDIAC CATHETERIZATION N/A 06/22/2015   Stable single vessel CAD, EF normal.  Procedure: Left Heart Cath and Coronary Angiography;  Surgeon: Leonie Man, MD;  Location: [place] [facility];  Service: Cardiovascular;  Laterality: N/A;  .  CATARACT EXTRACTION W/ INTRAOCULAR LENS  IMPLANT, BILATERAL  1990's  . [place]; 1960  . COLONOSCOPY  04/2001   Neg; pt cancelled screening colon 06/2011  . DILATION AND CURETTAGE OF UTERUS    . HERNIA REPAIR     abdominal "twice"  . LEFT HEART CATHETERIZATION WITH CORONARY ANGIOGRAM N/A 10/04/2011   Procedure: LEFT HEART CATHETERIZATION WITH CORONARY ANGIOGRAM;  Surgeon: Jacolyn Reedy, MD;  Location: [facility] CATH LAB;  Service: Cardiovascular;  Laterality: N/A;  . REPAIR KNEE LIGAMENT  ~ 2008   left  . TONSILLECTOMY     as a child  . TRANSTHORACIC ECHOCARDIOGRAM  05/2013   EF 60-65%, trivial aortic regurg, no wall motion abnorm    Outpatient Medications Prior to Visit  Medication Sig  Dispense Refill  . acetaminophen (TYLENOL) 325 MG tablet Take 325 mg by mouth every 6 (six) hours as needed.    Marland Kitchen amLODipine (NORVASC) 5 MG tablet Take 1 tablet (5 mg total) by mouth daily. 90 tablet 3  . aspirin 325 MG tablet Take 1 tablet (325 mg total) by mouth daily.    . cevimeline (EVOXAC) 30 MG capsule TAKE 1 CAPSULE 3 TIMES A DAY FOR TREATMENT OF CHRONIC DRY MOUTH 270 capsule 3  . Cholecalciferol (VITAMIN D3) 1000 UNITS CAPS Take 1,000 Units by mouth daily.     . fluticasone (FLONASE) 50 MCG/ACT nasal spray Place 2 sprays into both nostrils daily. 16 g 6  . losartan (COZAAR) 50 MG tablet Take 1 tablet (50 mg total) by mouth daily. 180 tablet 1  . nitroGLYCERIN (NITROSTAT) 0.4 MG SL tablet Place 1 tablet (0.4 mg total) under the tongue every 5 (five) minutes as needed for chest pain. 25 tablet 12  . Omega-3 Fatty Acids (FISH OIL) 1000 MG CAPS Take 1 capsule by mouth daily.    Marland Kitchen oxybutynin (DITROPAN) 5 MG tablet Take 5 mg by mouth every other day.  0  . pantoprazole (PROTONIX) 40 MG tablet Take 1 tablet (40 mg total) by mouth daily. 90 tablet 1  . polyethylene glycol powder (GLYCOLAX/MIRALAX) powder TAKE 17 G BY MOUTH 2 (TWO) TIMES DAILY. 527 g 5  . simvastatin (ZOCOR) 40 MG tablet TAKE 1 TABLET BY MOUTH EVERY DAY 90 tablet 1  . trimethoprim (TRIMPEX) 100 MG tablet TAKE 1 TABLET (100 MG TOTAL) BY MOUTH DAILY. 90 tablet 3   No facility-administered medications prior to visit.     Allergies  Allergen Reactions  . Macrobid [Nitrofurantoin Macrocrystal] Other (See Comments)    "I had chills & fever"    ROS As per HPI  PE: Blood pressure (!) 156/72, pulse 62, temperature 98.7 F (37.1 C), temperature source Oral, resp. rate 16, height 5\' 4"  (1.626 m), weight 122 lb (55.3 kg), SpO2 98 %. Gen: Alert, well appearing.  Patient is oriented to person, place, time, and situation. AFFECT: pleasant, lucid thought and speech. No further exam today.  LABS:   CC UA today: 3+ blood, 1+  protein, 3+ LEU, o/w normal.    Chemistry      Component Value Date/Time   NA 133 (L) 10/11/2017 1515   K 4.8 10/11/2017 1515   CL 98 10/11/2017 1515   CO2 30 10/11/2017 1515   BUN 20 10/11/2017 1515   CREATININE 0.75 10/11/2017 1515   CREATININE 0.83 06/29/2017 1425      Component Value Date/Time   CALCIUM 8.8 10/11/2017 1515   ALKPHOS 54 07/11/2016 1900   AST 17 06/29/2017 1425  ALT 12 06/29/2017 1425   BILITOT 0.4 06/29/2017 1425     Lab Results  Component Value Date   VITAMINB12 680 02/24/2016    IMPRESSION AND PLAN:  1) Acute UTI with hematuria: send for c/s. Cefdinir 300 mg bid x 5d.  2) Recurrent UTIs: she has been on trimethaprim for prophylaxis for years. Last culture showed organism with sensitivity to this med but MIC was much higher than most of the antibiotics tested for sensitivity. I will change her over to cefdinir 125mg  qd dose (125/5, 1 tsp qd) and d/c trimethaprim to see if we have any improved success at UTI prevention.  3) Urge incontinence: of note, she states she thinks that taking the ditropan 5mg  every other day instead of daily is helping (good control + less dry mouth)!  An After Visit Summary was printed and given to the patient.  FOLLOW UP: Return if symptoms worsen or fail to improve.  Signed:  Crissie Sickles, MD           11/28/2017

## 2017-11-28 NOTE — Patient Instructions (Signed)
Stop your trimethaprim (you won't take this medication anymore at all).  Take your cefdinir 300 mg two times a day for 5 days.  After finished with cefdinir 300mg  for 5 days, start taking your cefdinir suspension 1 tsp once a day EVERY DAY to prevent urinary tract infections.

## 2017-12-01 LAB — URINE CULTURE
MICRO NUMBER:: 90764253
SPECIMEN QUALITY: ADEQUATE

## 2017-12-14 ENCOUNTER — Ambulatory Visit: Payer: Medicare Other | Admitting: Family Medicine

## 2017-12-14 NOTE — Progress Notes (Deleted)
OFFICE VISIT  12/14/2017   CC: No chief complaint on file.    HPI:    Patient is a 84 y.o. Caucasian female who presents for f/u HTN, HLD, recurrent UTIs. She had an e coli UTI 11/28/17 that I treated with cefdinir. I also changed her daily prophylactic abx from trimethaprim to low dose cefdinir.  Past Medical History:  Diagnosis Date  . Abnormal finding on thyroid function test    per old records: T3 low, free T4 elevated, TSH normal.  [place]our thyroid made her feel worse and T4 went up so med d/c'd and liothyronine 5 mcg tried but also made pt feel worse.  Endo (Dr. Loanne Drilling felt like Biotin was causing the abnormal TFT's).  Repeat TFTs OFF BIOTIN x 2 wks were NORMAL.  NO FURTHER THYROID TESTING NEEDED.  Marland Kitchen Anginal pain ([place])   . Arthritis    "think it's osteo; got some in my hands"  . Bifascicular block   . Bladder infection, chronic 10/03/11   "have had one for over 1 yr"--has been on trimethaprim 100 mg qd since 2014  . CAD (coronary artery disease)    Cath 2009  Occluded LAD, 40-50% RCA and circ managed medically.  Repeat 06/22/15 no change.  . Chronic constipation    slow transit.  Barium enema to eval for colonic stricture 10/2014 was NORMAL  . Cognitive dysfunction    short term memory loss--takes ginko biloba  . COPD (chronic obstructive pulmonary disease) ([place])    Changes noted on CXR 2017  . GERD (gastroesophageal reflux disease)    (also LPR) Schatzki's ring, s/p dil '97; food impaction 08/2010, on PPI therapy since w/out further sx so no dil performed  . History of herpes zoster 06/2015   right side of chest; presented with chest pain mimicking [place]--cardiac eval showed stable CAD compared to 2009.  Marland Kitchen Hyperkalemia 06/2017   suspected due to increase of losartan from 25mg  bid to 50mg  bid.  Dose lowered back to 25 mg bid K normalized.  After increase again to 50 qAM and 25 qPM potassium 4.8 so I did not increase dose any further (07/20/17).  . Hyperlipidemia   . Hypertension    . Hyponatremia 10/03/2011   Na 131 on labs 02/2015 at [facility].  Baseline Na 132.  . Iron deficiency anemia 11/30/15   Hemoccults neg x 3 12/2015.  ?Mild malabsorption?  . Mixed stress and urge urinary incontinence   . RBBB with left anterior fascicular block    bifascicular block  . Recurrent UTI    Trimethaprim 100 mg qd --prophylaxis  . 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 Thrombocytopenia ([place]) 02/2013   Plts 114K  . Uterine cancer ([place]) 2000  . Xerostomia 2017/2018   Age related glandular atrophy + med effect (oxybutynin and gabapentin).  ANA and sjogren's panel NEG 06/2017.    Past Surgical History:  Procedure Laterality Date  . ABDOMINAL HYSTERECTOMY  2000  . APPENDECTOMY  2000  . CARDIAC CATHETERIZATION N/A 06/22/2015   Stable single vessel CAD, EF normal.  Procedure: Left Heart Cath and Coronary Angiography;  Surgeon: Leonie Man, MD;  Location: [place] [facility];  Service: Cardiovascular;  Laterality: N/A;  . CATARACT EXTRACTION W/ INTRAOCULAR LENS  IMPLANT, BILATERAL  1990's  . [place]; 1960  . COLONOSCOPY  04/2001   Neg; pt  cancelled screening colon 06/2011  . DILATION AND CURETTAGE OF UTERUS    . HERNIA REPAIR     abdominal "twice"  . LEFT HEART CATHETERIZATION WITH CORONARY ANGIOGRAM N/A 10/04/2011   Procedure: LEFT HEART CATHETERIZATION WITH CORONARY ANGIOGRAM;  Surgeon: Jacolyn Reedy, MD;  Location: [facility] CATH LAB;  Service: Cardiovascular;  Laterality: N/A;  . REPAIR KNEE LIGAMENT  ~ 2008   left  . TONSILLECTOMY     as a child  . TRANSTHORACIC ECHOCARDIOGRAM  05/2013   EF 60-65%, trivial aortic regurg, no wall motion abnorm    Outpatient Medications Prior to Visit  Medication Sig Dispense Refill  . acetaminophen (TYLENOL) 325 MG tablet Take 325 mg by mouth every 6 (six) hours as needed.    Marland Kitchen amLODipine (NORVASC) 5 MG tablet Take 1  tablet (5 mg total) by mouth daily. 90 tablet 3  . aspirin 325 MG tablet Take 1 tablet (325 mg total) by mouth daily.    . cefdinir (OMNICEF) 125 MG/5ML suspension 1 tsp po qd for prevention of urinary tract infection 150 mL 6  . cefdinir (OMNICEF) 300 MG capsule Take 1 capsule (300 mg total) by mouth 2 (two) times daily. 10 capsule 0  . cevimeline (EVOXAC) 30 MG capsule TAKE 1 CAPSULE 3 TIMES A DAY FOR TREATMENT OF CHRONIC DRY MOUTH 270 capsule 3  . Cholecalciferol (VITAMIN D3) 1000 UNITS CAPS Take 1,000 Units by mouth daily.     . fluticasone (FLONASE) 50 MCG/ACT nasal spray Place 2 sprays into both nostrils daily. 16 g 6  . losartan (COZAAR) 50 MG tablet Take 1 tablet (50 mg total) by mouth daily. 180 tablet 1  . nitroGLYCERIN (NITROSTAT) 0.4 MG SL tablet Place 1 tablet (0.4 mg total) under the tongue every 5 (five) minutes as needed for chest pain. 25 tablet 12  . Omega-3 Fatty Acids (FISH OIL) 1000 MG CAPS Take 1 capsule by mouth daily.    Marland Kitchen oxybutynin (DITROPAN) 5 MG tablet Take 5 mg by mouth every other day.  0  . pantoprazole (PROTONIX) 40 MG tablet Take 1 tablet (40 mg total) by mouth daily. 90 tablet 1  . polyethylene glycol powder (GLYCOLAX/MIRALAX) powder TAKE 17 G BY MOUTH 2 (TWO) TIMES DAILY. 527 g 5  . simvastatin (ZOCOR) 40 MG tablet TAKE 1 TABLET BY MOUTH EVERY DAY 90 tablet 1   No facility-administered medications prior to visit.     Allergies  Allergen Reactions  . Macrobid [Nitrofurantoin Macrocrystal] Other (See Comments)    "I had chills & fever"    ROS As per HPI  PE: There were no vitals taken for this visit. ***  LABS:  Lab Results  Component Value Date   TSH 1.55 11/29/2015   Lab Results  Component Value Date   WBC 8.7 07/11/2016   HGB 12.5 07/11/2016   HCT 36.2 07/11/2016   MCV 87.9 07/11/2016   PLT 146 (L) 07/11/2016   Lab Results  Component Value Date   IRON 86 11/29/2015   IRON 41 (L) 11/29/2015   TIBC 322 11/29/2015   FERRITIN 22.3  12/28/2015    Lab Results  Component Value Date   CREATININE 0.75 10/11/2017   BUN 20 10/11/2017   NA 133 (L) 10/11/2017   K 4.8 10/11/2017   CL 98 10/11/2017   CO2 30 10/11/2017   Lab Results  Component Value Date   ALT 12 06/29/2017   AST 17 06/29/2017   ALKPHOS 54 07/11/2016   BILITOT 0.4  06/29/2017   Lab Results  Component Value Date   CHOL 171 06/21/2015   Lab Results  Component Value Date   HDL 71 06/21/2015   Lab Results  Component Value Date   LDLCALC 95 06/21/2015   Lab Results  Component Value Date   TRIG 26 06/21/2015   Lab Results  Component Value Date   CHOLHDL 2.4 06/21/2015   Lab Results  Component Value Date   HGBA1C 5.8 (H) 05/30/2013    IMPRESSION AND PLAN:  No problem-specific Assessment & Plan notes found for this encounter.  An After Visit Summary was printed and given to the patient.  FOLLOW UP: No follow-ups on file.  Signed:  Crissie Sickles, MD           12/14/2017

## 2018-01-01 ENCOUNTER — Encounter: Payer: Self-pay | Admitting: Family Medicine

## 2018-01-01 ENCOUNTER — Ambulatory Visit (INDEPENDENT_AMBULATORY_CARE_PROVIDER_SITE_OTHER): Payer: Medicare Other | Admitting: Family Medicine

## 2018-01-01 VITALS — BP 101/58 | HR 72 | Temp 98.3°F | Resp 16 | Ht 64.0 in | Wt 121.4 lb

## 2018-01-01 DIAGNOSIS — N390 Urinary tract infection, site not specified: Secondary | ICD-10-CM

## 2018-01-01 DIAGNOSIS — I10 Essential (primary) hypertension: Secondary | ICD-10-CM | POA: Diagnosis not present

## 2018-01-01 DIAGNOSIS — R682 Dry mouth, unspecified: Secondary | ICD-10-CM

## 2018-01-01 DIAGNOSIS — N3941 Urge incontinence: Secondary | ICD-10-CM

## 2018-01-01 DIAGNOSIS — K117 Disturbances of salivary secretion: Secondary | ICD-10-CM

## 2018-01-01 NOTE — Progress Notes (Addendum)
OFFICE VISIT  01/01/2018   CC:  Chief Complaint  Patient presents with  . Follow-up    RCI, pt is not fasting.   HPI:    Patient is a 84 y.o. Caucasian female who presents for 2 mo f/u HTN, Hypercholesterolemia, recurrent UTIs, urge incontinence, and chronic xerostomia.  Recurrent UTI: I changed her prophylactic antibiotic from trimethaprim to cefdinir recently. No dysuria now, no change in baseline chronic frequency/urgency.  Has been feeling good lately, "I'm just getting older and slower". No home bp monitoring lately Taking oxybutynin every OTHER day and this is helping AND she has had less problem with dry mouth. She takes all her meds in the AM.   ROS: no CP, no SOB, no wheezing, no cough, no dizziness, no HAs, no rashes, no melena/hematochezia.   No myalgias or arthralgias.   Past Medical History:  Diagnosis Date  . Abnormal finding on thyroid function test    per old records: T3 low, free T4 elevated, TSH normal.  [place] thyroid made her feel worse and T4 went up so med d/c'd and liothyronine 5 mcg tried but also made pt feel worse.  Endo (Dr. Loanne Drilling felt like Biotin was causing the abnormal TFT's).  Repeat TFTs OFF BIOTIN x 2 wks were NORMAL.  NO FURTHER THYROID TESTING NEEDED.  Marland Kitchen Anginal pain ([place])   . Arthritis    "think it's osteo; got some in my hands"  . Bifascicular block   . Bladder infection, chronic 10/03/2011   "have had one for over 1 yr"--had been on trimethaprim 100 mg qd since 2014.  Changed to cefdinir 125mg  qd summer 2019.  Marland Kitchen CAD (coronary artery disease)    Cath 2009  Occluded LAD, 40-50% RCA and circ managed medically.  Repeat 06/22/15 no change.  . Chronic constipation    slow transit.  Barium enema to eval for colonic stricture 10/2014 was NORMAL  . Cognitive dysfunction    short term memory loss--takes ginko biloba  . COPD (chronic obstructive pulmonary disease) ([place])    Changes noted on CXR 2017  . GERD (gastroesophageal reflux disease)    (also LPR) Schatzki's ring, s/p dil '97; food impaction 08/2010, on PPI therapy since w/out further sx so no dil performed  . History of herpes zoster 06/2015   right side of chest; presented with chest pain mimicking [place]--cardiac eval showed stable CAD compared to 2009.  Marland Kitchen Hyperkalemia 06/2017   suspected due to increase of losartan from 25mg  bid to 50mg  bid.  Dose lowered back to 25 mg bid K normalized.  After increase again to 50 qAM and 25 qPM potassium 4.8 so I did not increase dose any further (07/20/17).  . Hyperlipidemia   . Hypertension   . Hyponatremia 10/03/2011   Na 131 on labs 02/2015 at [facility].  Baseline Na 132.  . Iron deficiency anemia 11/30/15   Hemoccults neg x 3 12/2015.  ?Mild malabsorption?  . Mixed stress and urge urinary incontinence   . RBBB with left anterior fascicular block    bifascicular block  . Recurrent UTI    Trimethaprim 100 mg qd --prophylaxis  . 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 Thrombocytopenia ([place]) 02/2013   Plts 114K  . Uterine cancer ([place]) 2000  . Xerostomia 2017/2018   Age related glandular atrophy + med effect (oxybutynin and gabapentin).  ANA and sjogren's  panel NEG 06/2017.    Past Surgical History:  Procedure Laterality Date  . ABDOMINAL HYSTERECTOMY  2000  . APPENDECTOMY  2000  . CARDIAC CATHETERIZATION N/A 06/22/2015   Stable single vessel CAD, EF normal.  Procedure: Left Heart Cath and Coronary Angiography;  Surgeon: Leonie Man, MD;  Location: [place] [facility];  Service: Cardiovascular;  Laterality: N/A;  . CATARACT EXTRACTION W/ INTRAOCULAR LENS  IMPLANT, BILATERAL  1990's  . [place]; 1960  . COLONOSCOPY  04/2001   Neg; pt cancelled screening colon 06/2011  . DILATION AND CURETTAGE OF UTERUS    . HERNIA REPAIR     abdominal "twice"  . LEFT HEART CATHETERIZATION WITH CORONARY ANGIOGRAM N/A 10/04/2011    Procedure: LEFT HEART CATHETERIZATION WITH CORONARY ANGIOGRAM;  Surgeon: Jacolyn Reedy, MD;  Location: [facility] CATH LAB;  Service: Cardiovascular;  Laterality: N/A;  . REPAIR KNEE LIGAMENT  ~ 2008   left  . TONSILLECTOMY     as a child  . TRANSTHORACIC ECHOCARDIOGRAM  05/2013   EF 60-65%, trivial aortic regurg, no wall motion abnorm    Outpatient Medications Prior to Visit  Medication Sig Dispense Refill  . acetaminophen (TYLENOL) 325 MG tablet Take 325 mg by mouth every 6 (six) hours as needed.    Marland Kitchen amLODipine (NORVASC) 5 MG tablet Take 1 tablet (5 mg total) by mouth daily. 90 tablet 3  . aspirin 325 MG tablet Take 1 tablet (325 mg total) by mouth daily.    . cefdinir (OMNICEF) 125 MG/5ML suspension 1 tsp po qd for prevention of urinary tract infection 150 mL 6  . cevimeline (EVOXAC) 30 MG capsule TAKE 1 CAPSULE 3 TIMES A DAY FOR TREATMENT OF CHRONIC DRY MOUTH 270 capsule 3  . Cholecalciferol (VITAMIN D3) 1000 UNITS CAPS Take 1,000 Units by mouth daily.     . fluticasone (FLONASE) 50 MCG/ACT nasal spray Place 2 sprays into both nostrils daily. 16 g 6  . losartan (COZAAR) 50 MG tablet Take 1 tablet (50 mg total) by mouth daily. 180 tablet 1  . nitroGLYCERIN (NITROSTAT) 0.4 MG SL tablet Place 1 tablet (0.4 mg total) under the tongue every 5 (five) minutes as needed for chest pain. 25 tablet 12  . Omega-3 Fatty Acids (FISH OIL) 1000 MG CAPS Take 1 capsule by mouth daily.    Marland Kitchen oxybutynin (DITROPAN) 5 MG tablet Take 5 mg by mouth every other day.  0  . pantoprazole (PROTONIX) 40 MG tablet Take 1 tablet (40 mg total) by mouth daily. 90 tablet 1  . polyethylene glycol powder (GLYCOLAX/MIRALAX) powder TAKE 17 G BY MOUTH 2 (TWO) TIMES DAILY. 527 g 5  . simvastatin (ZOCOR) 40 MG tablet TAKE 1 TABLET BY MOUTH EVERY DAY 90 tablet 1  . cefdinir (OMNICEF) 300 MG capsule Take 1 capsule (300 mg total) by mouth 2 (two) times daily. (Patient not taking: Reported on 01/01/2018) 10 capsule 0   No  facility-administered medications prior to visit.     Allergies  Allergen Reactions  . Macrobid [Nitrofurantoin Macrocrystal] Other (See Comments)    "I had chills & fever"    ROS As per HPI  PE: Blood pressure (!) 101/58, pulse 72, temperature 98.3 F (36.8 C), temperature source Oral, resp. rate 16, height 5\' 4"  (1.626 m), weight 121 lb 6 oz (55.1 kg), SpO2 98 %. Body mass index is 20.83 kg/m.  Gen: Alert, well appearing.  Patient is oriented to person, place, time, and situation. AFFECT: pleasant,  lucid thought and speech. CV: RRR, just a trace of systolic ejection murmur at cardiac base.  No m/r/g. Chest is clear, no wheezing or rales. Normal symmetric air entry throughout both lung fields. No chest wall deformities or tenderness. EXT: 1+ pitting edema RLL.  No edema in L LL.  LABS:  Lab Results  Component Value Date   TSH 1.55 11/29/2015   Lab Results  Component Value Date   WBC 8.7 07/11/2016   HGB 12.5 07/11/2016   HCT 36.2 07/11/2016   MCV 87.9 07/11/2016   PLT 146 (L) 07/11/2016   Lab Results  Component Value Date   IRON 86 11/29/2015   IRON 41 (L) 11/29/2015   TIBC 322 11/29/2015   FERRITIN 22.3 12/28/2015    Lab Results  Component Value Date   CREATININE 0.75 10/11/2017   BUN 20 10/11/2017   NA 133 (L) 10/11/2017   K 4.8 10/11/2017   CL 98 10/11/2017   CO2 30 10/11/2017   Lab Results  Component Value Date   ALT 12 06/29/2017   AST 17 06/29/2017   ALKPHOS 54 07/11/2016   BILITOT 0.4 06/29/2017   Lab Results  Component Value Date   CHOL 171 06/21/2015   Lab Results  Component Value Date   HDL 71 06/21/2015   Lab Results  Component Value Date   LDLCALC 95 06/21/2015   Lab Results  Component Value Date   TRIG 26 06/21/2015   Lab Results  Component Value Date   CHOLHDL 2.4 06/21/2015   Lab Results  Component Value Date   HGBA1C 5.8 (H) 05/30/2013    IMPRESSION AND PLAN:  1) HTN: bp's excellent and she feels great. No  changes today.  BMET at next f/u in 48mo.  2) Hypercholesterolemia: tolerating statin (has been stable on 40mg  simva + amlodipine long term, so I won't change this). Recheck FLP next f/u--she is not fasting today.  3) Urge incontinence: stable on qod dose of oxybutynin.  4) Chronic xerostomia: improved on evoxac and with taking oxybut only every other day instead of daily. Encouraged her to use her biotene mouth rinse 5-10 min prior to eating in order to help with moistening her food.  5) Recurrent UTI: most recent UTI resolved.  Asymptomatic at this time. Doing well on daily cefdinir 125mg  qd.  An After Visit Summary was printed and given to the patient.  FOLLOW UP: Return in about 3 months (around 04/03/2018) for routine chronic illness f/u--FASTING.  Signed:  Crissie Sickles, MD           01/01/2018

## 2018-01-09 ENCOUNTER — Ambulatory Visit: Payer: Self-pay | Admitting: *Deleted

## 2018-01-09 ENCOUNTER — Other Ambulatory Visit: Payer: Self-pay | Admitting: Family Medicine

## 2018-01-09 NOTE — Telephone Encounter (Signed)
Patient notified and verbalized understanding. 

## 2018-01-09 NOTE — Telephone Encounter (Signed)
Sabrina Alvarez began taking a prescription yesterday, Omnicef 125 MG/5ML 1 tsp prescribed by Dr. Anitra Lauth for the prevention of UTI's. She took the 1st dose yesterday and now has noticed whelps that are itchy. She denies SOB/tongue swelling, throat narrowing/hoarseness/cough. Instructed her to take antihistamine, she will take a benadryl now and again tonight. She denies any changes in lotions, detergents. And do not take any further of the antibiotic. Reviewed symptoms that would require emergency treatment, stated she understood. Scheduled appointment with Dr. Anitra Lauth for tomorrow morning. If she wakes up and hives/itching are gone, call to cancel appointment.  Please advise if you would like to prescribe different antibiotic. She stated she felt her urge incontinence was much improved with the ditropan.  Reason for Disposition . [1] Widespread hives, itching or facial swelling AND [2] onset > 2 hours after exposure to high-risk allergen (e.g., sting, nuts, 1st dose of antibiotic) . Hives or itching  Answer Assessment - Initial Assessment Questions 1. APPEARANCE: "What does the rash look like?"      whelps 2. LOCATION: "Where is the rash located?"      Arms, legs, neck. 3. NUMBER: "How many hives are there?"      One on the back of her neck. 2 hives on the arms and a few on the legs.  4. SIZE: "How big are the hives?" (inches, cm, compare to coins) "Do they all look the same or is there lots of variation in shape and size?"      They all look the same. 5. ONSET: "When did the hives begin?" (Hours or days ago)      Last night. 6. ITCHING: "Does it itch?" If so, ask: "How bad is the itch?"    - MILD: doesn't interfere with normal activities   - MODERATE - SEVERE: interferes with work, school, sleep, or other activities      Yes they all itch 7. RECURRENT PROBLEM: "Have you had hives before?" If so, ask: "When was the last time?" and "What happened that time?"      no 8. TRIGGERS: "Were you  exposed to any new food, plant, cosmetic product or animal just before the hives began?"     No but she started taking omnicef 1 tsp suspension, yesterday. 9. OTHER SYMPTOMS: "Do you have any other symptoms?" (e.g., fever, tongue swelling, difficulty breathing, abdominal pain)     None. No other symptoms. 10. PREGNANCY: "Is there any chance you are pregnant?" "When was your last menstrual period?"       no  Protocols used: HIVES-A-AH, RASH - WIDESPREAD ON DRUGS-A-AH

## 2018-01-09 NOTE — Telephone Encounter (Signed)
OK. No new med to replace this at this time. I want to wait and see how she does OFF of any daily antibiotic.-thx

## 2018-01-10 ENCOUNTER — Encounter: Payer: Self-pay | Admitting: Family Medicine

## 2018-01-10 ENCOUNTER — Ambulatory Visit (INDEPENDENT_AMBULATORY_CARE_PROVIDER_SITE_OTHER): Payer: Medicare Other | Admitting: Family Medicine

## 2018-01-10 VITALS — BP 124/60 | HR 70 | Temp 98.2°F | Resp 16 | Ht 64.0 in | Wt 123.0 lb

## 2018-01-10 DIAGNOSIS — R21 Rash and other nonspecific skin eruption: Secondary | ICD-10-CM | POA: Diagnosis not present

## 2018-01-10 DIAGNOSIS — T7840XA Allergy, unspecified, initial encounter: Secondary | ICD-10-CM | POA: Diagnosis not present

## 2018-01-10 MED ORDER — FLUTICASONE PROPIONATE 0.05 % EX CREA: TOPICAL_CREAM | CUTANEOUS | 0 refills | Status: DC

## 2018-01-10 NOTE — Patient Instructions (Signed)
Don't take anymore benadryl.  Buy over the counter generic allegra 60mg : take one tab twice per day as needed for itching.

## 2018-01-10 NOTE — Progress Notes (Signed)
OFFICE VISIT  01/10/2018   CC:  Chief Complaint  Patient presents with  . Rash     HPI:    Patient is a 84 y.o. Caucasian female who presents for recent rash-->hives after taking 1st dose of cefdinir suspension that I had rx'd for her for daily prophylaxis due to hx of recurrent UTIs.  This happened 2 d/a. She was instructed by nurse to take benadryl yesterday.  Helped itching "maybe".  None taken today. She had no associated swelling, wheezing, or SOB.  No oral ulcers.  No n/v.  No acute urinary complaints.   Says rash came all over her body, very itchy.  No progression of rash but she really is unable to tell if it has gotten better.  No exposure to anyone with similar rash. With lots of discussion with pt today and with verification with pharmacy today, it appears she has actually taken the cefdinir since I originally rx'd it at the end of June (6 wks ago). Pt has a bit more foggy cognition today than her usual, but she gets this way when she is worried and flustered, plus I don't think the benadryl has helped this situation any.     Past Medical History:  Diagnosis Date  . Abnormal finding on thyroid function test    per old records: T3 low, free T4 elevated, TSH normal.  Armour thyroid made her feel worse and T4 went up so med d/c'd and liothyronine 5 mcg tried but also made pt feel worse.  Endo (Dr. Loanne Drilling felt like Biotin was causing the abnormal TFT's).  Repeat TFTs OFF BIOTIN x 2 wks were NORMAL.  NO FURTHER THYROID TESTING NEEDED.  Marland Kitchen Anginal pain ([place])   . Arthritis    "think it's osteo; got some in my hands"  . Bifascicular block   . Bladder infection, chronic 10/03/2011   "have had one for over 1 yr"--had been on trimethaprim 100 mg qd since 2014.  Changed to cefdinir 125mg  qd summer 2019.  Marland Kitchen CAD (coronary artery disease)    Cath 2009  Occluded LAD, 40-50% RCA and circ managed medically.  Repeat 06/22/15 no change.  . Chronic constipation    slow transit.  Barium enema  to eval for colonic stricture 10/2014 was NORMAL  . Cognitive dysfunction    short term memory loss--takes ginko biloba  . COPD (chronic obstructive pulmonary disease) ([place])    Changes noted on CXR 2017  . GERD (gastroesophageal reflux disease)    (also LPR) Schatzki's ring, s/p dil '97; food impaction 08/2010, on PPI therapy since w/out further sx so no dil performed  . History of herpes zoster 06/2015   right side of chest; presented with chest pain mimicking [place]--cardiac eval showed stable CAD compared to 2009.  Marland Kitchen Hyperkalemia 06/2017   suspected due to increase of losartan from 25mg  bid to 50mg  bid.  Dose lowered back to 25 mg bid K normalized.  After increase again to 50 qAM and 25 qPM potassium 4.8 so I did not increase dose any further (07/20/17).  . Hyperlipidemia   . Hypertension   . Hyponatremia 10/03/2011   Na 131 on labs 02/2015 at [facility].  Baseline Na 132.  . Iron deficiency anemia 11/30/15   Hemoccults neg x 3 12/2015.  ?Mild malabsorption?  . Mixed stress and urge urinary incontinence   . RBBB with left anterior fascicular block    bifascicular block  . Recurrent UTI    Trimethaprim 100 mg qd --prophylaxis  .  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 Thrombocytopenia ([place]) 02/2013   Plts 114K  . Uterine cancer ([place]) 2000  . Xerostomia 2017/2018   Age related glandular atrophy + med effect (oxybutynin and gabapentin).  ANA and sjogren's panel NEG 06/2017.    Past Surgical History:  Procedure Laterality Date  . ABDOMINAL HYSTERECTOMY  2000  . APPENDECTOMY  2000  . CARDIAC CATHETERIZATION N/A 06/22/2015   Stable single vessel CAD, EF normal.  Procedure: Left Heart Cath and Coronary Angiography;  Surgeon: Leonie Man, MD;  Location: [facility];  Service: Cardiovascular;  Laterality: N/A;  . CATARACT EXTRACTION W/ INTRAOCULAR LENS  IMPLANT, BILATERAL  1990's   . [place]; 1960  . COLONOSCOPY  04/2001   Neg; pt cancelled screening colon 06/2011  . DILATION AND CURETTAGE OF UTERUS    . HERNIA REPAIR     abdominal "twice"  . LEFT HEART CATHETERIZATION WITH CORONARY ANGIOGRAM N/A 10/04/2011   Procedure: LEFT HEART CATHETERIZATION WITH CORONARY ANGIOGRAM;  Surgeon: Jacolyn Reedy, MD;  Location: [facility] CATH LAB;  Service: Cardiovascular;  Laterality: N/A;  . REPAIR KNEE LIGAMENT  ~ 2008   left  . TONSILLECTOMY     as a child  . TRANSTHORACIC ECHOCARDIOGRAM  05/2013   EF 60-65%, trivial aortic regurg, no wall motion abnorm    Outpatient Medications Prior to Visit  Medication Sig Dispense Refill  . acetaminophen (TYLENOL) 325 MG tablet Take 325 mg by mouth every 6 (six) hours as needed.    Marland Kitchen amLODipine (NORVASC) 5 MG tablet Take 1 tablet (5 mg total) by mouth daily. 90 tablet 3  . aspirin 325 MG tablet Take 1 tablet (325 mg total) by mouth daily.    . cevimeline (EVOXAC) 30 MG capsule TAKE 1 CAPSULE 3 TIMES A DAY FOR TREATMENT OF CHRONIC DRY MOUTH 270 capsule 3  . Cholecalciferol (VITAMIN D3) 1000 UNITS CAPS Take 1,000 Units by mouth daily.     . fluticasone (FLONASE) 50 MCG/ACT nasal spray Place 2 sprays into both nostrils daily. 16 g 6  . losartan (COZAAR) 50 MG tablet Take 1 tablet (50 mg total) by mouth daily. 180 tablet 1  . nitroGLYCERIN (NITROSTAT) 0.4 MG SL tablet Place 1 tablet (0.4 mg total) under the tongue every 5 (five) minutes as needed for chest pain. 25 tablet 12  . Omega-3 Fatty Acids (FISH OIL) 1000 MG CAPS Take 1 capsule by mouth daily.    Marland Kitchen oxybutynin (DITROPAN) 5 MG tablet Take 5 mg by mouth every other day.  0  . pantoprazole (PROTONIX) 40 MG tablet Take 1 tablet (40 mg total) by mouth daily. 90 tablet 1  . polyethylene glycol powder (GLYCOLAX/MIRALAX) powder TAKE 17 G BY MOUTH 2 (TWO) TIMES DAILY. 527 g 5  . simvastatin (ZOCOR) 40 MG tablet TAKE 1 TABLET BY MOUTH EVERY DAY 90 tablet 1   No facility-administered  medications prior to visit.     Allergies  Allergen Reactions  . Cefdinir Hives  . Macrobid [Nitrofurantoin Macrocrystal] Other (See Comments)    "I had chills & fever"    ROS As per HPI  PE: Blood pressure 124/60, pulse 70, temperature 98.2 F (36.8 C), temperature source Oral, resp. rate 16, height 5\' 4"  (1.626 m), weight 123 lb (55.8 kg), SpO2 99 %. Body mass index is 21.11 kg/m. Pt examined  with Helayne Seminole, CMA, as chaperone.  Gen: Alert, well appearing.  Patient is oriented to person, place, time, and situation. AFFECT: pleasant, lucid thought and speech.  Attends, focuses well.   SKIN: she has some 0.5 to 2 cm pinkish macules with central papulovesicular portion--ruptured. The pinkish region is blanchable with pressure on all lesions except for the one on back of R thigh. Areas affected: 3-4 on arms, 1 on back, 1 on each leg.  Otherwise her skin is clear. No joint swelling, erythema, or tenderness.  No LL edema.  LABS:    Chemistry      Component Value Date/Time   NA 133 (L) 10/11/2017 1515   K 4.8 10/11/2017 1515   CL 98 10/11/2017 1515   CO2 30 10/11/2017 1515   BUN 20 10/11/2017 1515   CREATININE 0.75 10/11/2017 1515   CREATININE 0.83 06/29/2017 1425      Component Value Date/Time   CALCIUM 8.8 10/11/2017 1515   ALKPHOS 54 07/11/2016 1900   AST 17 06/29/2017 1425   ALT 12 06/29/2017 1425   BILITOT 0.4 06/29/2017 1425       IMPRESSION AND PLAN:  Skin lesions/rash, itchy. Unusual for this to be a drug hypersensitivity/allergy rash, plus she has actually been taking the cephalexin for over a month.  However, given muddy history, will go ahead and keep her OFF cefdinir and put this on her allergy list. Recommended watchful waiting approach for rash, plus stop benadryl-->I think this is making her MCI worse. Change to allegra 60mg  bid otc prn itching. Also, will rx cutivate 0.05% for her to apply to each spot bid. Signs/symptoms to call or return for  were reviewed and pt expressed understanding.  No replacement abx for prevention of UTI at this time.  Decision to restart will depend on frequency of any future UTI's.  Spent 25 min with pt today, with >50% of this time spent in counseling and care coordination regarding the above problems.  An After Visit Summary was printed and given to the patient.  FOLLOW UP: Return in about 1 week (around 01/17/2018) for f/u rash.  Signed:  Crissie Sickles, MD           01/10/2018

## 2018-01-16 ENCOUNTER — Other Ambulatory Visit: Payer: Self-pay | Admitting: *Deleted

## 2018-01-16 MED ORDER — FLUTICASONE PROPIONATE 50 MCG/ACT NA SUSP: 2.0000 | Freq: Every day | NASAL | 6 refills | Status: DC

## 2018-01-17 ENCOUNTER — Ambulatory Visit (INDEPENDENT_AMBULATORY_CARE_PROVIDER_SITE_OTHER): Payer: Medicare Other | Admitting: Family Medicine

## 2018-01-17 ENCOUNTER — Encounter: Payer: Self-pay | Admitting: Family Medicine

## 2018-01-17 VITALS — BP 109/62 | HR 66 | Temp 98.3°F | Resp 16 | Ht 64.0 in | Wt 122.5 lb

## 2018-01-17 DIAGNOSIS — R21 Rash and other nonspecific skin eruption: Secondary | ICD-10-CM | POA: Diagnosis not present

## 2018-01-17 NOTE — Progress Notes (Signed)
OFFICE VISIT  01/17/2018   CC:  Chief Complaint  Patient presents with  . Follow-up    skin lesions   HPI:    Patient is a 84 y.o. Caucasian female who presents for 1 week f/u rash/skin lesions.  Question of whether rash was related to cefdinir, but not likely. Nevertheless, we have placed this med on her allergy list.  She is better.  No new lesions. The ones she had are clearing up alot.  No f/c/malaise.  No dysuria.  No change in chronic urgency/frequency.  Past Medical History:  Diagnosis Date  . Abnormal finding on thyroid function test    per old records: T3 low, free T4 elevated, TSH normal.  [place]our thyroid made her feel worse and T4 went up so med d/c'd and liothyronine 5 mcg tried but also made pt feel worse.  Endo (Dr. Loanne Drilling felt like Biotin was causing the abnormal TFT's).  Repeat TFTs OFF BIOTIN x 2 wks were NORMAL.  NO FURTHER THYROID TESTING NEEDED.  Marland Kitchen Anginal pain ([place])   . Arthritis    "think it's osteo; got some in my hands"  . Bifascicular block   . Bladder infection, chronic 10/03/2011   "have had one for over 1 yr"--had been on trimethaprim 100 mg qd since 2014.  Changed to cefdinir 125mg  qd summer 2019.  Marland Kitchen CAD (coronary artery disease)    Cath 2009  Occluded LAD, 40-50% RCA and circ managed medically.  Repeat 06/22/15 no change.  . Chronic constipation    slow transit.  Barium enema to eval for colonic stricture 10/2014 was NORMAL  . Cognitive dysfunction    short term memory loss--takes ginko biloba  . COPD (chronic obstructive pulmonary disease) ([place])    Changes noted on CXR 2017  . GERD (gastroesophageal reflux disease)    (also LPR) Schatzki's ring, s/p dil '97; food impaction 08/2010, on PPI therapy since w/out further sx so no dil performed  . History of herpes zoster 06/2015   right side of chest; presented with chest pain mimicking [place]--cardiac eval showed stable CAD compared to 2009.  Marland Kitchen Hyperkalemia 06/2017   suspected due to increase of losartan  from 25mg  bid to 50mg  bid.  Dose lowered back to 25 mg bid K normalized.  After increase again to 50 qAM and 25 qPM potassium 4.8 so I did not increase dose any further (07/20/17).  . Hyperlipidemia   . Hypertension   . Hyponatremia 10/03/2011   Na 131 on labs 02/2015 at [facility].  Baseline Na 132.  . Iron deficiency anemia 11/30/15   Hemoccults neg x 3 12/2015.  ?Mild malabsorption?  . Mixed stress and urge urinary incontinence   . RBBB with left anterior fascicular block    bifascicular block  . Recurrent UTI    Trimethaprim 100 mg qd --prophylaxis  . 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 Thrombocytopenia ([place]) 02/2013   Plts 114K  . Uterine cancer ([place]) 2000  . Xerostomia 2017/2018   Age related glandular atrophy + med effect (oxybutynin and gabapentin).  ANA and sjogren's panel NEG 06/2017.    Past Surgical History:  Procedure Laterality Date  . ABDOMINAL HYSTERECTOMY  2000  . APPENDECTOMY  2000  . CARDIAC CATHETERIZATION N/A 06/22/2015   Stable single vessel CAD, EF normal.  Procedure: Left Heart Cath and Coronary Angiography;  Surgeon: Leonie Green  Ellyn Hack, MD;  Location: [facility];  Service: Cardiovascular;  Laterality: N/A;  . CATARACT EXTRACTION W/ INTRAOCULAR LENS  IMPLANT, BILATERAL  1990's  . [place]; 1960  . COLONOSCOPY  04/2001   Neg; pt cancelled screening colon 06/2011  . DILATION AND CURETTAGE OF UTERUS    . HERNIA REPAIR     abdominal "twice"  . LEFT HEART CATHETERIZATION WITH CORONARY ANGIOGRAM N/A 10/04/2011   Procedure: LEFT HEART CATHETERIZATION WITH CORONARY ANGIOGRAM;  Surgeon: Jacolyn Reedy, MD;  Location: [facility] CATH LAB;  Service: Cardiovascular;  Laterality: N/A;  . REPAIR KNEE LIGAMENT  ~ 2008   left  . TONSILLECTOMY     as a child  . TRANSTHORACIC ECHOCARDIOGRAM  05/2013   EF 60-65%, trivial aortic regurg, no wall motion abnorm     Outpatient Medications Prior to Visit  Medication Sig Dispense Refill  . acetaminophen (TYLENOL) 325 MG tablet Take 325 mg by mouth every 6 (six) hours as needed.    Marland Kitchen amLODipine (NORVASC) 5 MG tablet Take 1 tablet (5 mg total) by mouth daily. 90 tablet 3  . aspirin 325 MG tablet Take 1 tablet (325 mg total) by mouth daily.    . cevimeline (EVOXAC) 30 MG capsule TAKE 1 CAPSULE 3 TIMES A DAY FOR TREATMENT OF CHRONIC DRY MOUTH 270 capsule 3  . Cholecalciferol (VITAMIN D3) 1000 UNITS CAPS Take 1,000 Units by mouth daily.     . fluticasone (CUTIVATE) 0.05 % cream Apply to spots on skin twice per day as needed 30 g 0  . fluticasone (FLONASE) 50 MCG/ACT nasal spray Place 2 sprays into both nostrils daily. 16 g 6  . losartan (COZAAR) 50 MG tablet Take 1 tablet (50 mg total) by mouth daily. 180 tablet 1  . nitroGLYCERIN (NITROSTAT) 0.4 MG SL tablet Place 1 tablet (0.4 mg total) under the tongue every 5 (five) minutes as needed for chest pain. 25 tablet 12  . Omega-3 Fatty Acids (FISH OIL) 1000 MG CAPS Take 1 capsule by mouth daily.    Marland Kitchen oxybutynin (DITROPAN) 5 MG tablet Take 5 mg by mouth every other day.  0  . pantoprazole (PROTONIX) 40 MG tablet Take 1 tablet (40 mg total) by mouth daily. 90 tablet 1  . polyethylene glycol powder (GLYCOLAX/MIRALAX) powder TAKE 17 G BY MOUTH 2 (TWO) TIMES DAILY. 527 g 5  . simvastatin (ZOCOR) 40 MG tablet TAKE 1 TABLET BY MOUTH EVERY DAY 90 tablet 1   No facility-administered medications prior to visit.     Allergies  Allergen Reactions  . Cefdinir Hives  . Macrobid [Nitrofurantoin Macrocrystal] Other (See Comments)    "I had chills & fever"    ROS As per HPI  PE: Blood pressure 109/62, pulse 66, temperature 98.3 F (36.8 C), temperature source Oral, resp. rate 16, height 5\' 4"  (1.626 m), weight 122 lb 8 oz (55.6 kg), SpO2 98 %. Body mass index is 21.03 kg/m.  Gen: Alert, well appearing.  Patient is oriented to person, place, time, and  situation. AFFECT: pleasant, lucid thought and speech. Skin: L arm with small splotch of very mild erythema nondistinct borders, nontender.  A few similar lesions on legs and back.  LABS:    Chemistry      Component Value Date/Time   NA 133 (L) 10/11/2017 1515   K 4.8 10/11/2017 1515   CL 98 10/11/2017 1515   CO2 30 10/11/2017 1515   BUN 20 10/11/2017 1515   CREATININE 0.75  10/11/2017 1515   CREATININE 0.83 06/29/2017 1425      Component Value Date/Time   CALCIUM 8.8 10/11/2017 1515   ALKPHOS 54 07/11/2016 1900   AST 17 06/29/2017 1425   ALT 12 06/29/2017 1425   BILITOT 0.4 06/29/2017 1425       IMPRESSION AND PLAN:  RAsh/skin lesions: resolving.  Question of drug rxn but low suspicion. Cefdinir now on her allergy list to be prudent. May stop cutivate cream.  Continue allegra 60mg  bid until lesions are totally cleared up. At this point, we are holding off on starting any replacement daily abx for prevention of recurrent UTI. No sx's of acute UTI at this time.  An After Visit Summary was printed and given to the patient.  FOLLOW UP: Return for as needed.   Signed:  Crissie Sickles, MD           01/17/2018

## 2018-02-25 ENCOUNTER — Telehealth: Payer: Self-pay | Admitting: *Deleted

## 2018-02-25 NOTE — Telephone Encounter (Signed)
Copied from [place] [id number]. Topic: Quick Communication - Rx Refill/Question >> Feb 25, 2018 12:45 PM Scherrie Gerlach wrote: Medication: oxybutynin (DITROPAN) 5 MG tablet  Son calling for the pt to inquire on how pt is supposed to be taking this med. Pt has 2 full bottles and [facility] keeps refilling. Pt states Dr advised her to try 1 pill EOD. But script states differently.  He said if you will call the pt and let her know how to take.

## 2018-03-11 ENCOUNTER — Telehealth: Payer: Self-pay | Admitting: Family Medicine

## 2018-03-11 NOTE — Telephone Encounter (Signed)
Copied from [place] [id number]. Topic: General - Other >> Mar 11, 2018  2:23 PM Sabrina Alvarez, NT wrote: Reason for CRM: Patient called and thinks she has a UTI and would like to come today or  tomorrow ,please call her at [phone] ,she also would like to know if she can leave a specimen if no appointment is available

## 2018-03-12 ENCOUNTER — Encounter: Payer: Self-pay | Admitting: Family Medicine

## 2018-03-12 ENCOUNTER — Ambulatory Visit (INDEPENDENT_AMBULATORY_CARE_PROVIDER_SITE_OTHER): Payer: Medicare Other | Admitting: Family Medicine

## 2018-03-12 VITALS — BP 164/74 | HR 60 | Temp 98.4°F | Resp 16 | Ht 64.0 in | Wt 124.4 lb

## 2018-03-12 DIAGNOSIS — R300 Dysuria: Secondary | ICD-10-CM

## 2018-03-12 DIAGNOSIS — N3001 Acute cystitis with hematuria: Secondary | ICD-10-CM | POA: Diagnosis not present

## 2018-03-12 LAB — POC URINALSYSI DIPSTICK (AUTOMATED)
Bilirubin, UA: NEGATIVE
Blood, UA: 50
GLUCOSE UA: NEGATIVE
KETONES UA: NEGATIVE
PH UA: 7 (ref 5.0–8.0)
PROTEIN UA: POSITIVE — AB
SPEC GRAV UA: 1.01 (ref 1.010–1.025)
Urobilinogen, UA: 2 E.U./dL — AB

## 2018-03-12 MED ORDER — CIPROFLOXACIN HCL 500 MG PO TABS: 500.0000 mg | ORAL_TABLET | Freq: Two times a day (BID) | ORAL | 0 refills | Status: AC

## 2018-03-12 NOTE — Progress Notes (Signed)
OFFICE VISIT  03/12/2018   CC:  Chief Complaint  Patient presents with  . Urinary Tract Infection    ? Pain with urination    HPI:    Patient is a 84 y.o. Caucasian female with history of recurrent UTI's who presents for pain upon urination. Onset 3 days ago or so.  Drinking cranberry juice has helped sx's some.  No fever, back pain, or n/v/ malaise. Empties bladder well.  No change in chronic urgency.  No blood in urine.  Reviewed med list in detail and to the best of my ability I was able to confirm that she is taking all listed meds as prescribed.  Past Medical History:  Diagnosis Date  . Abnormal finding on thyroid function test    per old records: T3 low, free T4 elevated, TSH normal.  [place] thyroid made her feel worse and T4 went up so med d/c'd and liothyronine 5 mcg tried but also made pt feel worse.  Endo (Dr. Loanne Drilling felt like Biotin was causing the abnormal TFT's).  Repeat TFTs OFF BIOTIN x 2 wks were NORMAL.  NO FURTHER THYROID TESTING NEEDED.  Marland Kitchen Anginal pain ([place])   . Arthritis    "think it's osteo; got some in my hands"  . Bifascicular block   . Bladder infection, chronic 10/03/2011   "have had one for over 1 yr"--had been on trimethaprim 100 mg qd 2014-2018.  Changed to cefdinir 125mg  qd summer 2019--but she thinks she had a rxn to this so we stopped it.  Marland Kitchen CAD (coronary artery disease)    Cath 2009  Occluded LAD, 40-50% RCA and circ managed medically.  Repeat 06/22/15 no change.  . Chronic constipation    slow transit.  Barium enema to eval for colonic stricture 10/2014 was NORMAL  . Cognitive dysfunction    short term memory loss--takes ginko biloba  . COPD (chronic obstructive pulmonary disease) ([place])    Changes noted on CXR 2017  . GERD (gastroesophageal reflux disease)    (also LPR) Schatzki's ring, s/p dil '97; food impaction 08/2010, on PPI therapy since w/out further sx so no dil performed  . History of herpes zoster 06/2015   right side of chest; presented  with chest pain mimicking [place]--cardiac eval showed stable CAD compared to 2009.  Marland Kitchen Hyperkalemia 06/2017   suspected due to increase of losartan from 25mg  bid to 50mg  bid.  Dose lowered back to 25 mg bid K normalized.  After increase again to 50 qAM and 25 qPM potassium 4.8 so I did not increase dose any further (07/20/17).  . Hyperlipidemia   . Hypertension   . Hyponatremia 10/03/2011   Na 131 on labs 02/2015 at [facility].  Baseline Na 132.  . Iron deficiency anemia 11/30/15   Hemoccults neg x 3 12/2015.  ?Mild malabsorption?  . Mixed stress and urge urinary incontinence   . RBBB with left anterior fascicular block    bifascicular block  . Recurrent UTI    Trimethaprim 100 mg qd --prophylaxis  . 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 Thrombocytopenia ([place]) 02/2013   Plts 114K  . Uterine cancer ([place]) 2000  . Xerostomia 2017/2018   Age related glandular atrophy + med effect (oxybutynin and gabapentin).  ANA and sjogren's panel NEG 06/2017.    Past Surgical History:  Procedure Laterality Date  . ABDOMINAL HYSTERECTOMY  2000  . APPENDECTOMY  2000  . CARDIAC CATHETERIZATION N/A 06/22/2015   Stable single vessel CAD, EF normal.  Procedure: Left Heart Cath and Coronary Angiography;  Surgeon: Leonie Man, MD;  Location: [facility];  Service: Cardiovascular;  Laterality: N/A;  . CATARACT EXTRACTION W/ INTRAOCULAR LENS  IMPLANT, BILATERAL  1990's  . [place]; 1960  . COLONOSCOPY  04/2001   Neg; pt cancelled screening colon 06/2011  . DILATION AND CURETTAGE OF UTERUS    . HERNIA REPAIR     abdominal "twice"  . LEFT HEART CATHETERIZATION WITH CORONARY ANGIOGRAM N/A 10/04/2011   Procedure: LEFT HEART CATHETERIZATION WITH CORONARY ANGIOGRAM;  Surgeon: Jacolyn Reedy, MD;  Location: [facility] CATH LAB;  Service: Cardiovascular;  Laterality: N/A;  . REPAIR KNEE LIGAMENT  ~  2008   left  . TONSILLECTOMY     as a child  . TRANSTHORACIC ECHOCARDIOGRAM  05/2013   EF 60-65%, trivial aortic regurg, no wall motion abnorm    Outpatient Medications Prior to Visit  Medication Sig Dispense Refill  . acetaminophen (TYLENOL) 325 MG tablet Take 325 mg by mouth every 6 (six) hours as needed.    Marland Kitchen amLODipine (NORVASC) 5 MG tablet Take 1 tablet (5 mg total) by mouth daily. 90 tablet 3  . aspirin 325 MG tablet Take 1 tablet (325 mg total) by mouth daily.    . cevimeline (EVOXAC) 30 MG capsule TAKE 1 CAPSULE 3 TIMES A DAY FOR TREATMENT OF CHRONIC DRY MOUTH 270 capsule 3  . Cholecalciferol (VITAMIN D3) 1000 UNITS CAPS Take 1,000 Units by mouth daily.     . fluticasone (FLONASE) 50 MCG/ACT nasal spray Place 2 sprays into both nostrils daily. 16 g 6  . losartan (COZAAR) 50 MG tablet Take 1 tablet (50 mg total) by mouth daily. 180 tablet 1  . nitroGLYCERIN (NITROSTAT) 0.4 MG SL tablet Place 1 tablet (0.4 mg total) under the tongue every 5 (five) minutes as needed for chest pain. 25 tablet 12  . Omega-3 Fatty Acids (FISH OIL) 1000 MG CAPS Take 1 capsule by mouth daily.    Marland Kitchen oxybutynin (DITROPAN) 5 MG tablet Take 5 mg by mouth every other day.  0  . pantoprazole (PROTONIX) 40 MG tablet Take 1 tablet (40 mg total) by mouth daily. 90 tablet 1  . polyethylene glycol powder (GLYCOLAX/MIRALAX) powder TAKE 17 G BY MOUTH 2 (TWO) TIMES DAILY. 527 g 5  . simvastatin (ZOCOR) 40 MG tablet TAKE 1 TABLET BY MOUTH EVERY DAY 90 tablet 1  . fluticasone (CUTIVATE) 0.05 % cream Apply to spots on skin twice per day as needed (Patient not taking: Reported on 03/12/2018) 30 g 0   No facility-administered medications prior to visit.     Allergies  Allergen Reactions  . Cefdinir Hives  . Macrobid [Nitrofurantoin Macrocrystal] Other (See Comments)    "I had chills & fever"    ROS As per HPI  PE: Blood pressure (!) 164/74, pulse 60, temperature 98.4 F (36.9 C), temperature source Oral, resp.  rate 16, height 5\' 4"  (1.626 m), weight 124 lb 6 oz (56.4 kg), SpO2 98 %. Body mass index is 21.35 kg/m.  Gen: Alert, well appearing.  Patient is oriented to person, place, time, and situation. AFFECT: pleasant, lucid thought and speech. No further exam today.  LABS:   CC UA today: + large leuks, + nitrite, +large blood, otherwise normal.    Chemistry      Component Value Date/Time  NA 133 (L) 10/11/2017 1515   K 4.8 10/11/2017 1515   CL 98 10/11/2017 1515   CO2 30 10/11/2017 1515   BUN 20 10/11/2017 1515   CREATININE 0.75 10/11/2017 1515   CREATININE 0.83 06/29/2017 1425      Component Value Date/Time   CALCIUM 8.8 10/11/2017 1515   ALKPHOS 54 07/11/2016 1900   AST 17 06/29/2017 1425   ALT 12 06/29/2017 1425   BILITOT 0.4 06/29/2017 1425      IMPRESSION AND PLAN:  1) Acute UTI, hx of recurrent UTI. Cipro 500mg  bid x 3d. Sent urine for c/s. Will continue with NO prophylactic antibiotic at this time (her last UTI prior to this one was over 3 mo ago).  2) HTN: bp elevated today. She is anxious and doesn't feel very well. No changes in bp med regimen made today.  An After Visit Summary was printed and given to the patient.  FOLLOW UP: Return for keep appt already scheduled for later this month.  Signed:  Crissie Sickles, MD           03/12/2018

## 2018-03-13 DIAGNOSIS — H524 Presbyopia: Secondary | ICD-10-CM | POA: Diagnosis not present

## 2018-03-13 DIAGNOSIS — Z961 Presence of intraocular lens: Secondary | ICD-10-CM | POA: Diagnosis not present

## 2018-03-13 DIAGNOSIS — H04123 Dry eye syndrome of bilateral lacrimal glands: Secondary | ICD-10-CM | POA: Diagnosis not present

## 2018-03-14 LAB — URINE CULTURE
MICRO NUMBER: 91208873
SPECIMEN QUALITY:: ADEQUATE

## 2018-03-31 ENCOUNTER — Other Ambulatory Visit: Payer: Self-pay | Admitting: Family Medicine

## 2018-04-04 ENCOUNTER — Ambulatory Visit: Payer: Medicare Other | Admitting: Family Medicine

## 2018-04-04 NOTE — Progress Notes (Deleted)
OFFICE VISIT  04/04/2018   CC: No chief complaint on file.  HPI:    Patient is a 84 y.o. Caucasian female who presents for f/u HTN, urge incontinence, and recurrent UTI.  Most recent UTI was 03/12/18: e coli sensitive to all abx tested except penicillins.  Treated with cipro. This was the first UTI in 3 mo for her (was not on prophylactic abx during this time).  Past Medical History:  Diagnosis Date  . Abnormal finding on thyroid function test    per old records: T3 low, free T4 elevated, TSH normal.  Armour thyroid made her feel worse and T4 went up so med d/c'd and liothyronine 5 mcg tried but also made pt feel worse.  Endo (Dr. Loanne Drilling felt like Biotin was causing the abnormal TFT's).  Repeat TFTs OFF BIOTIN x 2 wks were NORMAL.  NO FURTHER THYROID TESTING NEEDED.  Marland Kitchen Anginal pain ([place])   . Arthritis    "think it's osteo; got some in my hands"  . Bifascicular block   . Bladder infection, chronic 10/03/2011   "have had one for over 1 yr"--had been on trimethaprim 100 mg qd 2014-2018.  Changed to cefdinir 125mg  qd summer 2019--but she thinks she had a rxn to this so we stopped it.  Marland Kitchen CAD (coronary artery disease)    Cath 2009  Occluded LAD, 40-50% RCA and circ managed medically.  Repeat 06/22/15 no change.  . Chronic constipation    slow transit.  Barium enema to eval for colonic stricture 10/2014 was NORMAL  . Cognitive dysfunction    short term memory loss--takes ginko biloba  . COPD (chronic obstructive pulmonary disease) ([place])    Changes noted on CXR 2017  . GERD (gastroesophageal reflux disease)    (also LPR) Schatzki's ring, s/p dil '97; food impaction 08/2010, on PPI therapy since w/out further sx so no dil performed  . History of herpes zoster 06/2015   right side of chest; presented with chest pain mimicking USA--cardiac eval showed stable CAD compared to 2009.  Marland Kitchen Hyperkalemia 06/2017   suspected due to increase of losartan from 25mg  bid to 50mg  bid.  Dose lowered back to 25  mg bid K normalized.  After increase again to 50 qAM and 25 qPM potassium 4.8 so I did not increase dose any further (07/20/17).  . Hyperlipidemia   . Hypertension   . Hyponatremia 10/03/2011   Na 131 on labs 02/2015 at [facility].  Baseline Na 132.  . Iron deficiency anemia 11/30/15   Hemoccults neg x 3 12/2015.  ?Mild malabsorption?  . Mixed stress and urge urinary incontinence   . RBBB with left anterior fascicular block    bifascicular block  . Recurrent UTI    Trimethaprim 100 mg qd --prophylaxis  . 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 Thrombocytopenia ([place]) 02/2013   Plts 114K  . Uterine cancer ([place]) 2000  . Xerostomia 2017/2018   Age related glandular atrophy + med effect (oxybutynin and gabapentin).  ANA and sjogren's panel NEG 06/2017.    Past Surgical History:  Procedure Laterality Date  . ABDOMINAL HYSTERECTOMY  2000  . APPENDECTOMY  2000  . CARDIAC CATHETERIZATION N/A 06/22/2015   Stable single vessel CAD, EF normal.  Procedure: Left Heart Cath and Coronary Angiography;  Surgeon: Leonie Man, MD;  Location: [place] CV LAB;  Service: Cardiovascular;  Laterality: N/A;  . CATARACT EXTRACTION W/ INTRAOCULAR LENS  IMPLANT, BILATERAL  1990's  . [place]; 1960  . COLONOSCOPY  04/2001   Neg; pt cancelled screening colon 06/2011  . DILATION AND CURETTAGE OF UTERUS    . HERNIA REPAIR     abdominal "twice"  . LEFT HEART CATHETERIZATION WITH CORONARY ANGIOGRAM N/A 10/04/2011   Procedure: LEFT HEART CATHETERIZATION WITH CORONARY ANGIOGRAM;  Surgeon: Jacolyn Reedy, MD;  Location: [facility] CATH LAB;  Service: Cardiovascular;  Laterality: N/A;  . REPAIR KNEE LIGAMENT  ~ 2008   left  . TONSILLECTOMY     as a child  . TRANSTHORACIC ECHOCARDIOGRAM  05/2013   EF 60-65%, trivial aortic regurg, no wall motion abnorm    Outpatient Medications Prior to Visit   Medication Sig Dispense Refill  . acetaminophen (TYLENOL) 325 MG tablet Take 325 mg by mouth every 6 (six) hours as needed.    Marland Kitchen amLODipine (NORVASC) 5 MG tablet Take 1 tablet (5 mg total) by mouth daily. 90 tablet 3  . aspirin 325 MG tablet Take 1 tablet (325 mg total) by mouth daily.    . cevimeline (EVOXAC) 30 MG capsule TAKE 1 CAPSULE 3 TIMES A DAY FOR TREATMENT OF CHRONIC DRY MOUTH 270 capsule 3  . Cholecalciferol (VITAMIN D3) 1000 UNITS CAPS Take 1,000 Units by mouth daily.     . fluticasone (FLONASE) 50 MCG/ACT nasal spray Place 2 sprays into both nostrils daily. 16 g 6  . losartan (COZAAR) 50 MG tablet Take 1 tablet (50 mg total) by mouth daily. 180 tablet 1  . nitroGLYCERIN (NITROSTAT) 0.4 MG SL tablet Place 1 tablet (0.4 mg total) under the tongue every 5 (five) minutes as needed for chest pain. 25 tablet 12  . Omega-3 Fatty Acids (FISH OIL) 1000 MG CAPS Take 1 capsule by mouth daily.    Marland Kitchen oxybutynin (DITROPAN) 5 MG tablet Take 5 mg by mouth every other day.  0  . pantoprazole (PROTONIX) 40 MG tablet Take 1 tablet (40 mg total) by mouth daily. 90 tablet 1  . polyethylene glycol powder (GLYCOLAX/MIRALAX) powder TAKE 17 G BY MOUTH 2 (TWO) TIMES DAILY. 527 g 5  . simvastatin (ZOCOR) 40 MG tablet TAKE 1 TABLET BY MOUTH EVERY DAY 90 tablet 1   No facility-administered medications prior to visit.     Allergies  Allergen Reactions  . Cefdinir Hives  . Macrobid [Nitrofurantoin Macrocrystal] Other (See Comments)    "I had chills & fever"    ROS As per HPI  PE: There were no vitals taken for this visit. ***  LABS:  Lab Results  Component Value Date   TSH 1.55 11/29/2015   Lab Results  Component Value Date   WBC 8.7 07/11/2016   HGB 12.5 07/11/2016   HCT 36.2 07/11/2016   MCV 87.9 07/11/2016   PLT 146 (L) 07/11/2016   Lab Results  Component Value Date   CREATININE 0.75 10/11/2017   BUN 20 10/11/2017   NA 133 (L) 10/11/2017   K 4.8 10/11/2017   CL 98 10/11/2017    CO2 30 10/11/2017   Lab Results  Component Value Date   ALT 12 06/29/2017   AST 17 06/29/2017   ALKPHOS 54 07/11/2016   BILITOT 0.4 06/29/2017   Lab Results  Component Value Date   CHOL 171 06/21/2015   Lab Results  Component Value Date   HDL 71 06/21/2015   Lab Results  Component Value Date   LDLCALC  95 06/21/2015   Lab Results  Component Value Date   TRIG 26 06/21/2015   Lab Results  Component Value Date   CHOLHDL 2.4 06/21/2015   Lab Results  Component Value Date   HGBA1C 5.8 (H) 05/30/2013    IMPRESSION AND PLAN:  No problem-specific Assessment & Plan notes found for this encounter.   An After Visit Summary was printed and given to the patient.  FOLLOW UP: No follow-ups on file.  Signed:  Crissie Sickles, MD           04/04/2018

## 2018-04-05 ENCOUNTER — Ambulatory Visit (INDEPENDENT_AMBULATORY_CARE_PROVIDER_SITE_OTHER): Payer: Medicare Other

## 2018-04-05 DIAGNOSIS — Z23 Encounter for immunization: Secondary | ICD-10-CM

## 2018-04-19 ENCOUNTER — Ambulatory Visit: Payer: Self-pay | Admitting: *Deleted

## 2018-04-19 ENCOUNTER — Emergency Department (HOSPITAL_BASED_OUTPATIENT_CLINIC_OR_DEPARTMENT_OTHER): Payer: Medicare Other

## 2018-04-19 ENCOUNTER — Emergency Department (HOSPITAL_BASED_OUTPATIENT_CLINIC_OR_DEPARTMENT_OTHER)
Admission: EM | Admit: 2018-04-19 | Discharge: 2018-04-19 | Disposition: A | Discharge status: Home / Self Care | Payer: Medicare Other | Attending: Emergency Medicine | Admitting: Emergency Medicine

## 2018-04-19 ENCOUNTER — Other Ambulatory Visit: Payer: Self-pay

## 2018-04-19 ENCOUNTER — Encounter (HOSPITAL_BASED_OUTPATIENT_CLINIC_OR_DEPARTMENT_OTHER): Payer: Self-pay | Admitting: *Deleted

## 2018-04-19 DIAGNOSIS — Y998 Other external cause status: Secondary | ICD-10-CM | POA: Insufficient documentation

## 2018-04-19 DIAGNOSIS — Y93E1 Activity, personal bathing and showering: Secondary | ICD-10-CM | POA: Diagnosis not present

## 2018-04-19 DIAGNOSIS — Z79899 Other long term (current) drug therapy: Secondary | ICD-10-CM | POA: Diagnosis not present

## 2018-04-19 DIAGNOSIS — E785 Hyperlipidemia, unspecified: Secondary | ICD-10-CM | POA: Diagnosis not present

## 2018-04-19 DIAGNOSIS — I10 Essential (primary) hypertension: Secondary | ICD-10-CM | POA: Diagnosis not present

## 2018-04-19 DIAGNOSIS — J449 Chronic obstructive pulmonary disease, unspecified: Secondary | ICD-10-CM | POA: Diagnosis not present

## 2018-04-19 DIAGNOSIS — S61501A Unspecified open wound of right wrist, initial encounter: Secondary | ICD-10-CM | POA: Diagnosis not present

## 2018-04-19 DIAGNOSIS — W182XXA Fall in (into) shower or empty bathtub, initial encounter: Secondary | ICD-10-CM | POA: Diagnosis not present

## 2018-04-19 DIAGNOSIS — Z23 Encounter for immunization: Secondary | ICD-10-CM | POA: Insufficient documentation

## 2018-04-19 DIAGNOSIS — R55 Syncope and collapse: Secondary | ICD-10-CM | POA: Diagnosis not present

## 2018-04-19 DIAGNOSIS — Y92002 Bathroom of unspecified non-institutional (private) residence single-family (private) house as the place of occurrence of the external cause: Secondary | ICD-10-CM | POA: Diagnosis not present

## 2018-04-19 DIAGNOSIS — N390 Urinary tract infection, site not specified: Secondary | ICD-10-CM | POA: Diagnosis not present

## 2018-04-19 DIAGNOSIS — I2510 Atherosclerotic heart disease of native coronary artery without angina pectoris: Secondary | ICD-10-CM | POA: Diagnosis not present

## 2018-04-19 LAB — URINALYSIS, ROUTINE W REFLEX MICROSCOPIC
BILIRUBIN URINE: NEGATIVE
GLUCOSE, UA: NEGATIVE mg/dL
HGB URINE DIPSTICK: NEGATIVE
Ketones, ur: NEGATIVE mg/dL
Nitrite: POSITIVE — AB
PROTEIN: NEGATIVE mg/dL
Specific Gravity, Urine: 1.015 (ref 1.005–1.030)
pH: 7 (ref 5.0–8.0)

## 2018-04-19 LAB — BASIC METABOLIC PANEL
Anion gap: 10 (ref 5–15)
BUN: 18 mg/dL (ref 8–23)
CALCIUM: 8.9 mg/dL (ref 8.9–10.3)
CO2: 26 mmol/L (ref 22–32)
CREATININE: 0.85 mg/dL (ref 0.44–1.00)
Chloride: 96 mmol/L — ABNORMAL LOW (ref 98–111)
GFR calc Af Amer: >60 mL/min (ref >60)
GFR calc non Af Amer: 59 mL/min — ABNORMAL LOW (ref >60)
GLUCOSE: 148 mg/dL — AB (ref 70–99)
Potassium: 3.7 mmol/L (ref 3.5–5.1)
Sodium: 132 mmol/L — ABNORMAL LOW (ref 135–145)

## 2018-04-19 LAB — CBC
HCT: 35.9 % — ABNORMAL LOW (ref 36.0–46.0)
Hemoglobin: 11.7 g/dL — ABNORMAL LOW (ref 12.0–15.0)
MCH: 29.5 pg (ref 26.0–34.0)
MCHC: 32.6 g/dL (ref 30.0–36.0)
MCV: 90.4 fL (ref 80.0–100.0)
Platelets: 134 10*3/uL — ABNORMAL LOW (ref 150–400)
RBC: 3.97 MIL/uL (ref 3.87–5.11)
RDW: 13.4 % (ref 11.5–15.5)
WBC: 7.4 10*3/uL (ref 4.0–10.5)
nRBC: 0 % (ref 0.0–0.2)

## 2018-04-19 LAB — URINALYSIS, MICROSCOPIC (REFLEX)

## 2018-04-19 LAB — CBG MONITORING, ED: Glucose-Capillary: 142 mg/dL — ABNORMAL HIGH (ref 70–99)

## 2018-04-19 MED ORDER — TETANUS-DIPHTH-ACELL PERTUSSIS 5-2.5-18.5 LF-MCG/0.5 IM SUSP
0.5000 mL | Freq: Once | INTRAMUSCULAR | Status: AC
  Administered 2018-04-19: 0.5 mL via INTRAMUSCULAR
  Filled 2018-04-19: qty 0.5

## 2018-04-19 MED ORDER — SODIUM CHLORIDE 0.9 % IV BOLUS
1000.0000 mL | Freq: Once | INTRAVENOUS | Status: AC
  Administered 2018-04-19: 1000 mL via INTRAVENOUS

## 2018-04-19 MED ORDER — CIPROFLOXACIN HCL 500 MG PO TABS: 500.0000 mg | ORAL_TABLET | Freq: Two times a day (BID) | ORAL | 0 refills | Status: DC

## 2018-04-19 MED ORDER — CIPROFLOXACIN HCL 500 MG PO TABS
500.0000 mg | ORAL_TABLET | Freq: Once | ORAL | Status: AC
  Administered 2018-04-19: 500 mg via ORAL
  Filled 2018-04-19: qty 1

## 2018-04-19 NOTE — Telephone Encounter (Signed)
Pt's sons called with their mom passing out in the bathroom this morning around 11 am. She called them. She does not know how long she was out but does not think it was that long.  She hit her wrist and has a skin tear there. Measuring about 3 inches long and 2 inches wide. It was bleeding but not now.  Pt is on ASA 325 mg daily.  She is alert and oriented at this time with name, month, location and president of [place]. Pt states she feels fine. Pt stated that she may be a little dehydrated, not drinking much water today. Son stated that she appears weaker to him and that she had been trying to cook and do a lot of things. Feels like she is trying to do too much. Son her b/p and heart rate: 127/65,  148/72,  123/71,  122/65 and HR  53-65. Notified flow at [facility] at [facility]. Per protocol pt's son will be taking her to [place] ED to be assessed.   Reason for Disposition . [1] Age > 50 years  AND [2] now alert and feels fine  Protocols used: [facility]

## 2018-04-19 NOTE — ED Notes (Signed)
  1 pair of diamond earrings taken off in RM 9 and placed in clean empty urine collection cup with lid and placed in patient belonging bag with bandage supplies. Pt aware. Tech witnessed. Hearing aides removed while in CT and placed back on pts ears prior to leaving CT room.

## 2018-04-19 NOTE — Telephone Encounter (Signed)
  Answer Assessment - Initial Assessment Questions 1. ONSET: "How long were you unconscious?" (minutes) "When did it happen?"     Does not know 2. CONTENT: "What happened during period of unconsciousness?" (e.g., seizure activity)      n/a 3. MENTAL STATUS: "Alert and oriented now?" (oriented x 3 = name, month, location)      yes 4. TRIGGER: "What do you think caused the fainting?" "What were you doing just before you fainted?"  (e.g., exercise, sudden standing up, prolonged standing)     Not sure 5. RECURRENT SYMPTOM: "Have you ever passed out before?" If so, ask: "When was the last time?" and "What happened that time?"      no 6. INJURY: "Did you sustain any injury during the fall?"      Has skin tear on right wrist, 3 inches long and a couple inches wide 7. CARDIAC SYMPTOMS: "Have you had any of the following symptoms: chest pain, difficulty breathing, palpitations?"     no 8. NEUROLOGIC SYMPTOMS: "Have you had any of the following symptoms: headache, numbness, vertigo, weakness?"      no 9. GI SYMPTOMS: "Have you had any of the following symptoms: abdominal pain, vomiting, diarrhea, blood in stools?"     no 10. OTHER SYMPTOMS: "Do you have any other symptoms?"       no  Protocols used: [facility]

## 2018-04-19 NOTE — ED Provider Notes (Signed)
[place] EMERGENCY DEPARTMENT Provider Note   CSN: [id number] Arrival date & time: 04/19/18  1410     History   Chief Complaint Chief Complaint  Patient presents with  . Loss of Consciousness    HPI Sabrina Alvarez is a 84 y.o. female.  She presents to the emergency department after a syncopal event today.  Said she just gotten out of the shower and then she lost consciousness and fell to the floor.  She said she was out only briefly and she remembers falling.  She struck her arm and has a skin tear on the right.  She denies hitting her head.  She was able to get herself up and is been awake and alert since then.  She did talk to her sons and they wanted her to come here and get checked out.  No prior history of syncopal events.  She denies any recent illness.  No headache no numbness no weakness no chest pain no shortness of breath no abdominal pain no nausea no vomiting no diarrhea no urinary symptoms other than frequency.  The history is provided by the patient.  Loss of Consciousness   This is a new problem. The current episode started 3 to 5 hours ago. The problem has been resolved. She lost consciousness for a period of less than one minute. The problem is associated with normal activity. Pertinent negatives include abdominal pain, back pain, bladder incontinence, bowel incontinence, chest pain, confusion, diaphoresis, dizziness, fever, headaches, nausea, slurred speech, vertigo, visual change and vomiting. She has tried nothing for the symptoms. The treatment provided significant relief. Her past medical history is significant for CAD.    Past Medical History:  Diagnosis Date  . Abnormal finding on thyroid function test    per old records: T3 low, free T4 elevated, TSH normal.  Armour thyroid made her feel worse and T4 went up so med d/c'd and liothyronine 5 mcg tried but also made pt feel worse.  Endo (Dr. Loanne Drilling felt like Biotin was causing the abnormal TFT's).   Repeat TFTs OFF BIOTIN x 2 wks were NORMAL.  NO FURTHER THYROID TESTING NEEDED.  Marland Kitchen Anginal pain ([place])   . Arthritis    "think it's osteo; got some in my hands"  . Bifascicular block   . Bladder infection, chronic 10/03/2011   "have had one for over 1 yr"--had been on trimethaprim 100 mg qd 2014-2018.  Changed to cefdinir 125mg  qd summer 2019--but she thinks she had a rxn to this so we stopped it.  Marland Kitchen CAD (coronary artery disease)    Cath 2009  Occluded LAD, 40-50% RCA and circ managed medically.  Repeat 06/22/15 no change.  . Chronic constipation    slow transit.  Barium enema to eval for colonic stricture 10/2014 was NORMAL  . Cognitive dysfunction    short term memory loss--takes ginko biloba  . COPD (chronic obstructive pulmonary disease) ([place])    Changes noted on CXR 2017  . GERD (gastroesophageal reflux disease)    (also LPR) Schatzki's ring, s/p dil '97; food impaction 08/2010, on PPI therapy since w/out further sx so no dil performed  . History of herpes zoster 06/2015   right side of chest; presented with chest pain mimicking USA--cardiac eval showed stable CAD compared to 2009.  Marland Kitchen Hyperkalemia 06/2017   suspected due to increase of losartan from 25mg  bid to 50mg  bid.  Dose lowered back to 25 mg bid K normalized.  After increase again to 50  qAM and 25 qPM potassium 4.8 so I did not increase dose any further (07/20/17).  . Hyperlipidemia   . Hypertension   . Hyponatremia 10/03/2011   Na 131 on labs 02/2015 at [facility].  Baseline Na 132.  . Iron deficiency anemia 11/30/15   Hemoccults neg x 3 12/2015.  ?Mild malabsorption?  . Mixed stress and urge urinary incontinence   . RBBB with left anterior fascicular block    bifascicular block  . Recurrent UTI    Trimethaprim 100 mg qd --prophylaxis  . 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 Thrombocytopenia ([place]) 02/2013   Plts 114K  .  Uterine cancer ([place]) 2000  . Xerostomia 2017/2018   Age related glandular atrophy + med effect (oxybutynin and gabapentin).  ANA and sjogren's panel NEG 06/2017.    Patient Active Problem List   Diagnosis Date Noted  . Hyperkalemia 06/05/2017  . Chronic constipation 02/25/2016  . Iron deficiency anemia 12/03/2015  . Postherpetic neuralgia 09/01/2015  . Cerumen impaction 09/01/2015  . Thyroid disorder 05/26/2015  . Third nerve palsy 05/29/2013  . Essential hypertension   . Hyperlipidemia   . GERD (gastroesophageal reflux disease)   . History of malignant neoplasm of vagina   . Coronary artery disease, occlusive;  100 % CTO of LAD  06/22/2007    Past Surgical History:  Procedure Laterality Date  . ABDOMINAL HYSTERECTOMY  2000  . APPENDECTOMY  2000  . CARDIAC CATHETERIZATION N/A 06/22/2015   Stable single vessel CAD, EF normal.  Procedure: Left Heart Cath and Coronary Angiography;  Surgeon: Leonie Man, MD;  Location: [place] CV LAB;  Service: Cardiovascular;  Laterality: N/A;  . CATARACT EXTRACTION W/ INTRAOCULAR LENS  IMPLANT, BILATERAL  1990's  . [place]; 1960  . COLONOSCOPY  04/2001   Neg; pt cancelled screening colon 06/2011  . DILATION AND CURETTAGE OF UTERUS    . HERNIA REPAIR     abdominal "twice"  . LEFT HEART CATHETERIZATION WITH CORONARY ANGIOGRAM N/A 10/04/2011   Procedure: LEFT HEART CATHETERIZATION WITH CORONARY ANGIOGRAM;  Surgeon: Jacolyn Reedy, MD;  Location: [facility] CATH LAB;  Service: Cardiovascular;  Laterality: N/A;  . REPAIR KNEE LIGAMENT  ~ 2008   left  . TONSILLECTOMY     as a child  . TRANSTHORACIC ECHOCARDIOGRAM  05/2013   EF 60-65%, trivial aortic regurg, no wall motion abnorm     OB History   None      Home Medications    Prior to Admission medications   Medication Sig Start Date End Date Taking? Authorizing Provider  acetaminophen (TYLENOL) 325 MG tablet Take 325 mg by mouth every 6 (six) hours as needed.    [provider]  amLODipine (NORVASC) 5 MG tablet Take 1 tablet (5 mg total) by mouth daily. 10/15/17   McGowen, Adrian Blackwater, MD  aspirin 325 MG tablet Take 1 tablet (325 mg total) by mouth daily. 05/31/13   Geradine Girt, DO  cevimeline (EVOXAC) 30 MG capsule TAKE 1 CAPSULE 3 TIMES A DAY FOR TREATMENT OF CHRONIC DRY MOUTH 07/31/17   McGowen, Adrian Blackwater, MD  Cholecalciferol (VITAMIN D3) 1000 UNITS CAPS Take 1,000 Units by mouth daily.     [provider]  fluticasone (FLONASE) 50 MCG/ACT nasal spray Place 2 sprays into both nostrils daily. 01/16/18   McGowen, Adrian Blackwater, MD  losartan (COZAAR) 50 MG tablet Take 1 tablet (50 mg total) by mouth daily. 09/27/17   McGowen, Adrian Blackwater, MD  nitroGLYCERIN (NITROSTAT) 0.4 MG SL tablet Place 1 tablet (0.4 mg total) under the tongue every 5 (five) minutes as needed for chest pain. 06/23/15   Jacolyn Reedy, MD  Omega-3 Fatty Acids (FISH OIL) 1000 MG CAPS Take 1 capsule by mouth daily.    [provider]  oxybutynin (DITROPAN) 5 MG tablet Take 5 mg by mouth every other day. 11/08/17   [provider]  pantoprazole (PROTONIX) 40 MG tablet Take 1 tablet (40 mg total) by mouth daily. 06/26/17   McGowen, Adrian Blackwater, MD  polyethylene glycol powder (GLYCOLAX/MIRALAX) powder TAKE 17 G BY MOUTH 2 (TWO) TIMES DAILY. 01/01/17   McGowen, Adrian Blackwater, MD  simvastatin (ZOCOR) 40 MG tablet TAKE 1 TABLET BY MOUTH EVERY DAY 04/01/18   McGowen, Adrian Blackwater, MD    Family History Family History  Problem Relation Age of Onset  . Arthritis Mother   . AAA (abdominal aortic aneurysm) Mother   . Diabetes Sister   . CVA Father   . Rheum arthritis Sister   . Cancer Neg Hx   . Heart disease Neg Hx   . Thyroid disease Neg Hx     Social History Social History   Tobacco Use  . Smoking status: Never Smoker  . Smokeless tobacco: Never Used  Substance Use Topics  . Alcohol use: No  . Drug use: No     Allergies   Cefdinir and Macrobid [nitrofurantoin  macrocrystal]   Review of Systems Review of Systems  Constitutional: Negative for diaphoresis and fever.  HENT: Negative for sore throat.   Eyes: Negative for visual disturbance.  Respiratory: Negative for shortness of breath.   Cardiovascular: Positive for syncope. Negative for chest pain.  Gastrointestinal: Negative for abdominal pain, bowel incontinence, nausea and vomiting.  Genitourinary: Negative for bladder incontinence and dysuria.  Musculoskeletal: Negative for back pain.  Skin: Positive for wound. Negative for rash.  Neurological: Negative for dizziness, vertigo and headaches.  Psychiatric/Behavioral: Negative for confusion.     Physical Exam Updated Vital Signs BP (!) 143/72 (BP Location: Right Arm)   Pulse (!) 59   Temp 97.9 F (36.6 C) (Oral)   Resp 17   Ht 5\' 5"  (1.651 m)   Wt 56.4 kg   SpO2 98%   BMI 20.69 kg/m   Physical Exam  Constitutional: She appears well-developed and well-nourished. No distress.  HENT:  Head: Normocephalic and atraumatic.  Eyes: Conjunctivae are normal.  Neck: Neck supple.  Cardiovascular: Normal rate, regular rhythm and normal heart sounds.  No murmur heard. Pulmonary/Chest: Effort normal and breath sounds normal. No respiratory distress.  Abdominal: Soft. There is no tenderness.  Musculoskeletal: She exhibits no edema, tenderness or deformity.  Skin tear dorsum right wrist, FROM nontender, distal n/v intact. FROM all ext without limitation or pain.   Neurological: She is alert. She has normal strength. No sensory deficit. Gait normal. GCS eye subscore is 4. GCS verbal subscore is 5. GCS motor subscore is 6.  Skin: Skin is warm and dry.  Psychiatric: She has a normal mood and affect.  Nursing note and vitals reviewed.    ED Treatments / Results  Labs (all labs ordered are listed, but only abnormal results are displayed) Labs Reviewed  BASIC METABOLIC PANEL - Abnormal; Notable for the following components:      Result  Value   Sodium 132 (*)  Chloride 96 (*)    Glucose, Bld 148 (*)    GFR calc non Af Amer 59 (*)    All other components within normal limits  CBC - Abnormal; Notable for the following components:   Hemoglobin 11.7 (*)    HCT 35.9 (*)    Platelets 134 (*)    All other components within normal limits  URINALYSIS, ROUTINE W REFLEX MICROSCOPIC - Abnormal; Notable for the following components:   Nitrite POSITIVE (*)    Leukocytes, UA SMALL (*)    All other components within normal limits  URINALYSIS, MICROSCOPIC (REFLEX) - Abnormal; Notable for the following components:   Bacteria, UA MANY (*)    All other components within normal limits  CBG MONITORING, ED - Abnormal; Notable for the following components:   Glucose-Capillary 142 (*)    All other components within normal limits  URINE CULTURE    EKG EKG Interpretation  Date/Time:  Friday April 19 2018 14:28:12 EST Ventricular Rate:  61 PR Interval:    QRS Duration: 158 QT Interval:  476 QTC Calculation: 480 R Axis:   63 Text Interpretation:  Sinus or ectopic atrial rhythm Prolonged PR interval Right bundle branch block similar to prior 2/18 Confirmed by Aletta Edouard [phone]) on 04/19/2018 2:55:45 PM   Radiology Ct Head Wo Contrast  Result Date: 04/19/2018 CLINICAL DATA:  Recent loss of consciousness EXAM: CT HEAD WITHOUT CONTRAST TECHNIQUE: Contiguous axial images were obtained from the base of the skull through the vertex without intravenous contrast. COMPARISON:  07/11/2016 FINDINGS: Brain: Mild atrophic changes and chronic white matter ischemic changes are noted commensurate with the patient's given age. A lacunar infarct is noted within the basal ganglia on the right stable in appearance from the prior exam. No findings to suggest acute hemorrhage, acute infarction or space-occupying mass lesion are noted. Vascular: No hyperdense vessel or unexpected calcification. Skull: Normal. Negative for fracture or focal lesion.  Sinuses/Orbits: No acute finding. Other: None. IMPRESSION: Chronic atrophic and ischemic changes without acute abnormality. Electronically Signed   By: Inez Catalina M.D.   On: 04/19/2018 16:09    Procedures Procedures (including critical care time)  Medications Ordered in ED Medications  sodium chloride 0.9 % bolus 1,000 mL (0 mLs Intravenous Stopped 04/19/18 1636)  Tdap (BOOSTRIX) injection 0.5 mL (0.5 mLs Intramuscular Given 04/19/18 1543)  ciprofloxacin (CIPRO) tablet 500 mg (500 mg Oral Given 04/19/18 1732)     Initial Impression / Assessment and Plan / ED Course  I have reviewed the triage vital signs and the nursing notes.  Pertinent labs & imaging results that were available during my care of the patient were reviewed by me and considered in my medical decision making (see chart for details).  Clinical Course as of Apr 20 1501  Fri Apr 19, 2018  1700 Patient's lab work significant for possible UTI.  She is received about half of her fluids and she says she feels back to baseline would like to be discharged.  Asked if she would stick around to get the rest of the fluids and I will start her on some oral antibiotics.  Her son is here to will take her home and keep an eye on her.   [MB]    Clinical Course User Index [MB] Hayden Rasmussen, MD     Final Clinical Impressions(s) / ED Diagnoses   Final diagnoses:  Syncope and collapse  Urinary tract infection in female    ED Discharge Orders  Ordered    ciprofloxacin (CIPRO) 500 MG tablet  2 times daily     04/19/18 1703           Hayden Rasmussen, MD 04/20/18 [phone]

## 2018-04-19 NOTE — ED Triage Notes (Signed)
She passed out this am and fell. Injury to her right forearm. Skin tear. She is ambulatory, alert oriented.

## 2018-04-19 NOTE — Discharge Instructions (Signed)
You were seen in the emergency department for a fainting spell after getting out of the shower.  You had a CAT scan and blood work.  Your urine test showed a possible urine infection.  We gave you some IV fluids and antibiotic and we will send you home with a prescription for more antibiotic to take for the next week.  Please follow-up with your doctor and return if any worsening symptoms.

## 2018-04-19 NOTE — Telephone Encounter (Signed)
Thank you :)

## 2018-04-22 LAB — URINE CULTURE: Culture: >=100000 — AB

## 2018-04-23 ENCOUNTER — Telehealth: Payer: Self-pay | Admitting: Emergency Medicine

## 2018-04-23 NOTE — Telephone Encounter (Signed)
Post ED Visit - Positive Culture Follow-up  Culture report reviewed by antimicrobial stewardship pharmacist:  []  Elenor Quinones, Pharm.D. []  Heide Guile, Pharm.D., BCPS AQ-ID []  Parks Neptune, Pharm.D., BCPS []  Alycia Rossetti, Pharm.D., BCPS []  [place], Pharm.D., BCPS, AAHIVP []  Legrand Como, Pharm.D., BCPS, AAHIVP [x]  Salome Arnt, PharmD, BCPS []  Johnnette Gourd, PharmD, BCPS []  Hughes Better, PharmD, BCPS []  Leeroy Cha, PharmD  Positive urine culture Treated with ciprofloxacin, organism sensitive to the same and no further patient follow-up is required at this time.  Hazle Nordmann 04/23/2018, 11:07 AM

## 2018-04-24 ENCOUNTER — Ambulatory Visit: Payer: Medicare Other | Admitting: Family Medicine

## 2018-04-26 ENCOUNTER — Encounter: Payer: Self-pay | Admitting: Family Medicine

## 2018-04-26 ENCOUNTER — Ambulatory Visit (INDEPENDENT_AMBULATORY_CARE_PROVIDER_SITE_OTHER): Payer: Medicare Other | Admitting: Family Medicine

## 2018-04-26 VITALS — BP 118/65 | HR 68 | Temp 98.9°F | Resp 16 | Ht 64.0 in | Wt 125.5 lb

## 2018-04-26 DIAGNOSIS — N390 Urinary tract infection, site not specified: Secondary | ICD-10-CM

## 2018-04-26 DIAGNOSIS — J029 Acute pharyngitis, unspecified: Secondary | ICD-10-CM

## 2018-04-26 DIAGNOSIS — N3000 Acute cystitis without hematuria: Secondary | ICD-10-CM | POA: Diagnosis not present

## 2018-04-26 DIAGNOSIS — I10 Essential (primary) hypertension: Secondary | ICD-10-CM

## 2018-04-26 DIAGNOSIS — J069 Acute upper respiratory infection, unspecified: Secondary | ICD-10-CM | POA: Diagnosis not present

## 2018-04-26 LAB — POCT RAPID STREP A (OFFICE): Rapid Strep A Screen: NEGATIVE

## 2018-04-26 MED ORDER — CIPROFLOXACIN HCL 250 MG PO TABS: ORAL_TABLET | ORAL | 6 refills | Status: DC

## 2018-04-26 NOTE — Progress Notes (Signed)
OFFICE VISIT  04/26/2018   CC:  Chief Complaint  Patient presents with  . Sore Throat    HPI:    Patient is a 84 y.o. Caucasian female who presents for sore throat. Onset about 24h ago, pain in entire mouth and throat is sore.  No fever.  Some runny nose and sneezing.  No cough or HA. No heartburn or indigestion.  Feels some matter in back of throat that she has to clear out. Eating and drinking fine.    Pt was seen in ED 04/19/18 for syncopal episode upon getting out of her shower. CT head neg acute.  UTI dx'd in ED and pt was put on cipro 500 mg bid. Urine clx showed e coli sensitive to all abx tested except penicillins. She denies any current UTI sx's.  HTN: she is not taking amlodipine, says she thought she was told not to take it anymore. Occ home bp check is <130/80.  Past Medical History:  Diagnosis Date  . Abnormal finding on thyroid function test    per old records: T3 low, free T4 elevated, TSH normal.  Armour thyroid made her feel worse and T4 went up so med d/c'd and liothyronine 5 mcg tried but also made pt feel worse.  Endo (Dr. Loanne Drilling felt like Biotin was causing the abnormal TFT's).  Repeat TFTs OFF BIOTIN x 2 wks were NORMAL.  NO FURTHER THYROID TESTING NEEDED.  Marland Kitchen Anginal pain ([place])   . Arthritis    "think it's osteo; got some in my hands"  . Bifascicular block   . Bladder infection, chronic 10/03/2011   "have had one for over 1 yr"--had been on trimethaprim 100 mg qd 2014-2018.  Changed to cefdinir 125mg  qd summer 2019--but she thinks she had a rxn to this so we stopped it.  Marland Kitchen CAD (coronary artery disease)    Cath 2009  Occluded LAD, 40-50% RCA and circ managed medically.  Repeat 06/22/15 no change.  . Chronic constipation    slow transit.  Barium enema to eval for colonic stricture 10/2014 was NORMAL  . Cognitive dysfunction    short term memory loss--takes ginko biloba  . COPD (chronic obstructive pulmonary disease) ([place])    Changes noted on CXR 2017  .  GERD (gastroesophageal reflux disease)    (also LPR) Schatzki's ring, s/p dil '97; food impaction 08/2010, on PPI therapy since w/out further sx so no dil performed  . History of herpes zoster 06/2015   right side of chest; presented with chest pain mimicking USA--cardiac eval showed stable CAD compared to 2009.  Marland Kitchen Hyperkalemia 06/2017   suspected due to increase of losartan from 25mg  bid to 50mg  bid.  Dose lowered back to 25 mg bid K normalized.  After increase again to 50 qAM and 25 qPM potassium 4.8 so I did not increase dose any further (07/20/17).  . Hyperlipidemia   . Hypertension   . Hyponatremia 10/03/2011   Na 131 on labs 02/2015 at [facility].  Baseline Na 132.  . Iron deficiency anemia 11/30/15   Hemoccults neg x 3 12/2015.  ?Mild malabsorption?  . Mixed stress and urge urinary incontinence   . RBBB with left anterior fascicular block    bifascicular block  . Recurrent UTI    Trimethaprim 100 mg qd --prophylaxis  . 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 Thrombocytopenia ([place])  02/2013   Plts 114K  . Uterine cancer ([place]) 2000  . Xerostomia 2017/2018   Age related glandular atrophy + med effect (oxybutynin and gabapentin).  ANA and sjogren's panel NEG 06/2017.    Past Surgical History:  Procedure Laterality Date  . ABDOMINAL HYSTERECTOMY  2000  . APPENDECTOMY  2000  . CARDIAC CATHETERIZATION N/A 06/22/2015   Stable single vessel CAD, EF normal.  Procedure: Left Heart Cath and Coronary Angiography;  Surgeon: Leonie Man, MD;  Location: [place] [facility];  Service: Cardiovascular;  Laterality: N/A;  . CATARACT EXTRACTION W/ INTRAOCULAR LENS  IMPLANT, BILATERAL  1990's  . [place]; 1960  . COLONOSCOPY  04/2001   Neg; pt cancelled screening colon 06/2011  . DILATION AND CURETTAGE OF UTERUS    . HERNIA REPAIR     abdominal "twice"  . LEFT HEART  CATHETERIZATION WITH CORONARY ANGIOGRAM N/A 10/04/2011   Procedure: LEFT HEART CATHETERIZATION WITH CORONARY ANGIOGRAM;  Surgeon: Jacolyn Reedy, MD;  Location: [facility] CATH LAB;  Service: Cardiovascular;  Laterality: N/A;  . REPAIR KNEE LIGAMENT  ~ 2008   left  . TONSILLECTOMY     as a child  . TRANSTHORACIC ECHOCARDIOGRAM  05/2013   EF 60-65%, trivial aortic regurg, no wall motion abnorm    Outpatient Medications Prior to Visit  Medication Sig Dispense Refill  . acetaminophen (TYLENOL) 325 MG tablet Take 325 mg by mouth every 6 (six) hours as needed.    Marland Kitchen aspirin 325 MG tablet Take 1 tablet (325 mg total) by mouth daily.    . cevimeline (EVOXAC) 30 MG capsule TAKE 1 CAPSULE 3 TIMES A DAY FOR TREATMENT OF CHRONIC DRY MOUTH 270 capsule 3  . Cholecalciferol (VITAMIN D3) 1000 UNITS CAPS Take 1,000 Units by mouth daily.     . fluticasone (FLONASE) 50 MCG/ACT nasal spray Place 2 sprays into both nostrils daily. 16 g 6  . losartan (COZAAR) 50 MG tablet Take 1 tablet (50 mg total) by mouth daily. 180 tablet 1  . nitroGLYCERIN (NITROSTAT) 0.4 MG SL tablet Place 1 tablet (0.4 mg total) under the tongue every 5 (five) minutes as needed for chest pain. 25 tablet 12  . Omega-3 Fatty Acids (FISH OIL) 1000 MG CAPS Take 1 capsule by mouth daily.    Marland Kitchen oxybutynin (DITROPAN) 5 MG tablet Take 5 mg by mouth every other day.  0  . pantoprazole (PROTONIX) 40 MG tablet Take 1 tablet (40 mg total) by mouth daily. 90 tablet 1  . polyethylene glycol powder (GLYCOLAX/MIRALAX) powder TAKE 17 G BY MOUTH 2 (TWO) TIMES DAILY. 527 g 5  . simvastatin (ZOCOR) 40 MG tablet TAKE 1 TABLET BY MOUTH EVERY DAY 90 tablet 1  . ciprofloxacin (CIPRO) 500 MG tablet Take 1 tablet (500 mg total) by mouth 2 (two) times daily. 14 tablet 0  . amLODipine (NORVASC) 5 MG tablet Take 1 tablet (5 mg total) by mouth daily. (Patient not taking: Reported on 04/26/2018) 90 tablet 3   No facility-administered medications prior to visit.      Allergies  Allergen Reactions  . Cefdinir Hives  . Macrobid [Nitrofurantoin Macrocrystal] Other (See Comments)    "I had chills & fever"    ROS As per HPI  PE: Blood pressure 118/65, pulse 68, temperature 98.9 F (37.2 C), temperature source Oral, resp. rate 16, height 5\' 4"  (1.626 m), weight 125 lb 8 oz (56.9 kg), SpO2 99 %. VS: noted--normal. Gen: alert, NAD, NONTOXIC APPEARING. HEENT: eyes  without injection, drainage, or swelling.  Ears: EACs clear, TMs with normal light reflex and landmarks.  Nose: Clear rhinorrhea, with some dried, crusty exudate adherent to mildly injected mucosa.  No purulent d/c.  No paranasal sinus TTP.  No facial swelling.  Throat and mouth without focal lesion.  No pharyngial swelling, erythema, or exudate.   Neck: supple, no LAD.   LUNGS: CTA bilat, nonlabored resps.   CV: RRR, no m/r/g. EXT: no c/c/e SKIN: no rash  LABS:    Chemistry      Component Value Date/Time   NA 132 (L) 04/19/2018 1444   K 3.7 04/19/2018 1444   CL 96 (L) 04/19/2018 1444   CO2 26 04/19/2018 1444   BUN 18 04/19/2018 1444   CREATININE 0.85 04/19/2018 1444   CREATININE 0.83 06/29/2017 1425      Component Value Date/Time   CALCIUM 8.9 04/19/2018 1444   ALKPHOS 54 07/11/2016 1900   AST 17 06/29/2017 1425   ALT 12 06/29/2017 1425   BILITOT 0.4 06/29/2017 1425     Lab Results  Component Value Date   WBC 7.4 04/19/2018   HGB 11.7 (L) 04/19/2018   HCT 35.9 (L) 04/19/2018   MCV 90.4 04/19/2018   PLT 134 (L) 04/19/2018   Rapid strep NEG  IMPRESSION AND PLAN:  1) Viral URI with ST. Reassured. Symptomatic care with tylenol prn, salt water gargle, saline nasal spray.  2) Recurrent UTI, recent acute UTI---last dose of cipro this evening. She is now asymptomatic.  Start daily cipro 250mg  for prophylaxis at this time.  3) Syncope: vasovagal suspected, possible contribution from UTI as well.  4) HTN: seems to be fine OFF of amlodipine, so we'll take this off her  med list.  An After Visit Summary was printed and given to the patient.  FOLLOW UP: Return in about 2 months (around 06/26/2018).  Signed:  Crissie Sickles, MD           04/26/2018

## 2018-04-26 NOTE — Patient Instructions (Signed)
Use otc generic saline nasal spray 2-3 times per day to irrigate/moisturize your nasal passages.  Tomorrow you will start taking the cipro 250mg  tab once a day EVERY DAY.

## 2018-04-29 ENCOUNTER — Telehealth: Payer: Self-pay | Admitting: Family Medicine

## 2018-04-29 NOTE — Telephone Encounter (Signed)
Copied from [place] [phone]. Topic: Quick Communication - See Telephone Encounter >> Apr 29, 2018  2:47 PM Ivar Drape wrote: CRM for notification. See Telephone encounter for: 04/29/18. Patient was in to see the provider last week but her throat is still sore.  Please advise.  Please call [phone].

## 2018-04-29 NOTE — Telephone Encounter (Signed)
I agree with all your suggestions/recommendations.

## 2018-04-29 NOTE — Telephone Encounter (Signed)
FYI.    SW pt, I asked if she had any new symptoms (fever, chills, sweats, body aches) or if her sore throat has gotten worse. Pt stated that she has no new symptoms and her throat has not gotten worse. I asked pt if she has been doing the salt water gargling and nasal saline spray, she stated that she has. I advised her that the rapid strep in the office was negative and since she has no new symptoms and her sore throat has not gotten worse she should continue doing the salt water gargling, nasal saline spray, take the tylenol prn and can also try using a cool mist humidifier at nigh while she is sleeping. Pt stated that she will continue with these recommendations and call back if no improvement in the next few days.

## 2018-04-30 ENCOUNTER — Ambulatory Visit: Payer: Medicare Other | Admitting: Family Medicine

## 2018-05-20 DIAGNOSIS — B351 Tinea unguium: Secondary | ICD-10-CM | POA: Diagnosis not present

## 2018-06-26 DIAGNOSIS — H04123 Dry eye syndrome of bilateral lacrimal glands: Secondary | ICD-10-CM | POA: Diagnosis not present

## 2018-06-26 DIAGNOSIS — H524 Presbyopia: Secondary | ICD-10-CM | POA: Diagnosis not present

## 2018-07-22 ENCOUNTER — Other Ambulatory Visit: Payer: Self-pay | Admitting: *Deleted

## 2018-07-22 NOTE — Telephone Encounter (Addendum)
Message from Berneta Levins sent at 07/22/2018 4:45 PM EST   Summary: medication ?   Pt wants to know if she is supposed to be taking ciprofloxacin (CIPRO) 250 MG tablet.         Called patient back and explained to her that she has refills at the pharmacy. She needs to call them when she is almost out. She did not fully understand. I will notify the pharmacy that she needs a refill on the Cipro and have them call her when it is ready   Pharmacy notified and will get the prescription ready and notify the patient.Marland Kitchen

## 2018-07-23 ENCOUNTER — Telehealth: Payer: Self-pay | Admitting: Family Medicine

## 2018-07-23 NOTE — Telephone Encounter (Signed)
Duplicate message. Please see other phone note dated 07/23/18.

## 2018-07-23 NOTE — Telephone Encounter (Signed)
Reviewed pts chart. Pt is taking cipro 250mg  daily for UTI prevention. She needs to continue taking this medication. Pt reported back in November that she was no longer taking amlodipine, her BP at that time was fine so we decided to keep her off the amlodipine. Pt does not need to take amlodipine.    Left message for son Merry Proud to call back, okay per DPR.

## 2018-07-23 NOTE — Telephone Encounter (Signed)
Patient's son is here today with his mother. He is helping her manage her medications. He is asking about Cipro Rx. She only has 2 left. He wants to know if she should refill it. He also is asking about AmLODiPine. He found it on an old med list. She doesn't have any. Should he refill it or is she supposed to be taking something else?

## 2018-07-24 NOTE — Telephone Encounter (Signed)
Reviewed pts chart.   She should be taking losartan 50mg  daily.  Pts son Merry Proud advised and voiced understanding.

## 2018-07-24 NOTE — Telephone Encounter (Signed)
Pt son advised of message below and will call pharmacy for a refill on Cipro. He also needs to know if his mom is to take 50 MG or 100 MG of Losartan daily? Please advise.

## 2018-08-02 ENCOUNTER — Ambulatory Visit: Payer: Medicare Other | Admitting: Family Medicine

## 2018-08-07 ENCOUNTER — Encounter: Payer: Self-pay | Admitting: Family Medicine

## 2018-08-07 ENCOUNTER — Ambulatory Visit (INDEPENDENT_AMBULATORY_CARE_PROVIDER_SITE_OTHER): Payer: Medicare Other | Admitting: Family Medicine

## 2018-08-07 VITALS — BP 110/65 | HR 67 | Temp 98.1°F | Resp 16 | Ht 64.0 in | Wt 127.8 lb

## 2018-08-07 DIAGNOSIS — E7800 Pure hypercholesterolemia, unspecified: Secondary | ICD-10-CM | POA: Diagnosis not present

## 2018-08-07 DIAGNOSIS — K117 Disturbances of salivary secretion: Secondary | ICD-10-CM

## 2018-08-07 DIAGNOSIS — I10 Essential (primary) hypertension: Secondary | ICD-10-CM

## 2018-08-07 DIAGNOSIS — G3184 Mild cognitive impairment, so stated: Secondary | ICD-10-CM

## 2018-08-07 DIAGNOSIS — N3941 Urge incontinence: Secondary | ICD-10-CM

## 2018-08-07 DIAGNOSIS — R682 Dry mouth, unspecified: Secondary | ICD-10-CM

## 2018-08-07 DIAGNOSIS — Z87440 Personal history of urinary (tract) infections: Secondary | ICD-10-CM

## 2018-08-07 NOTE — Patient Instructions (Signed)
Stop taking your simvastatin for a month. We'll see if your memory improves any.

## 2018-08-07 NOTE — Progress Notes (Signed)
OFFICE VISIT  08/07/2018   CC:  Chief Complaint  Patient presents with  . Follow-up    RCI, pt is not fasting.     HPI:    Patient is a 85 y.o. Caucasian female with cognitive impairment with memory loss, HTN, HLD, urge incontinence, and recurrent UTIs who presents for polypharmacy/discussion about meds.  Says memory impairment is her main concern, she says it is hard to say if consistently worse. Has no need for assistance with ADLs.  She drives but fairly limited.  Takes care of finances. She has trouble with keeping meds straight, her son sets up daily meds so she takes them all correctly. Works outside in yard when weather warm.  HTN: compliant with med. No home bp monitoring lately.  No dysuria or urinary urgency.  Chronic frequency.   Dry mouth is not as bad lately as in the past.  HLD: taking statin daily w/out problem..  ROS: no CP, no SOB, no wheezing, no cough, no dizziness, no HAs, no rashes, no melena/hematochezia.  No polyuria or polydipsia.  No myalgias or arthralgias.   Past Medical History:  Diagnosis Date  . Abnormal finding on thyroid function test    per old records: T3 low, free T4 elevated, TSH normal.  [place] thyroid made her feel worse and T4 went up so med d/c'd and liothyronine 5 mcg tried but also made pt feel worse.  Endo (Dr. Loanne Drilling felt like Biotin was causing the abnormal TFT's).  Repeat TFTs OFF BIOTIN x 2 wks were NORMAL.  NO FURTHER THYROID TESTING NEEDED.  Marland Kitchen Arthritis    "think it's osteo; got some in my hands"  . Bifascicular block   . CAD (coronary artery disease)    Cath 2009  Occluded LAD, 40-50% RCA and circ managed medically.  Repeat 06/22/15 no change.  . Chronic constipation    slow transit.  Barium enema to eval for colonic stricture 10/2014 was NORMAL  . Cognitive dysfunction    short term memory loss--takes ginko biloba  . COPD (chronic obstructive pulmonary disease) ([place])    Changes noted on CXR 2017  . GERD (gastroesophageal  reflux disease)    (also LPR) Schatzki's ring, s/p dil '97; food impaction 08/2010, on PPI therapy since w/out further sx so no dil performed  . History of herpes zoster 06/2015   right side of chest; presented with chest pain mimicking USA--cardiac eval showed stable CAD compared to 2009.  Marland Kitchen Hyperkalemia 06/2017   suspected due to increase of losartan from 25mg  bid to 50mg  bid.  Dose lowered back to 25 mg bid K normalized.  After increase again to 50 qAM and 25 qPM potassium 4.8 so I did not increase dose any further (07/20/17).  . Hyperlipidemia   . Hypertension   . Hyponatremia 10/03/2011   Na 131 on labs 02/2015 at [facility].  Baseline Na 132.  . Iron deficiency anemia 11/30/15   Hemoccults neg x 3 12/2015.  ?Mild malabsorption?  . Mixed stress and urge urinary incontinence   . RBBB with left anterior fascicular block    bifascicular block  . Recurrent UTI    Bactrim prophyl, then changed to cefdinir (?med rxn?).  Cipro qd as of 08/2018.  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 Thrombocytopenia ([place]) 02/2013   Plts 114K  . Uterine cancer ([place]) 2000  .  Xerostomia 2017/2018   Age related glandular atrophy + med effect (oxybutynin and gabapentin).  ANA and sjogren's panel NEG 06/2017.    Past Surgical History:  Procedure Laterality Date  . ABDOMINAL HYSTERECTOMY  2000  . APPENDECTOMY  2000  . CARDIAC CATHETERIZATION N/A 06/22/2015   Stable single vessel CAD, EF normal.  Procedure: Left Heart Cath and Coronary Angiography;  Surgeon: Leonie Man, MD;  Location: [place] [facility];  Service: Cardiovascular;  Laterality: N/A;  . CATARACT EXTRACTION W/ INTRAOCULAR LENS  IMPLANT, BILATERAL  1990's  . [place]; 1960  . COLONOSCOPY  04/2001   Neg; pt cancelled screening colon 06/2011  . DILATION AND CURETTAGE OF UTERUS    . HERNIA REPAIR     abdominal "twice"  . LEFT  HEART CATHETERIZATION WITH CORONARY ANGIOGRAM N/A 10/04/2011   Procedure: LEFT HEART CATHETERIZATION WITH CORONARY ANGIOGRAM;  Surgeon: Jacolyn Reedy, MD;  Location: [facility] CATH LAB;  Service: Cardiovascular;  Laterality: N/A;  . REPAIR KNEE LIGAMENT  ~ 2008   left  . TONSILLECTOMY     as a child  . TRANSTHORACIC ECHOCARDIOGRAM  05/2013   EF 60-65%, trivial aortic regurg, no wall motion abnorm    Outpatient Medications Prior to Visit  Medication Sig Dispense Refill  . acetaminophen (TYLENOL) 325 MG tablet Take 325 mg by mouth every 6 (six) hours as needed.    Marland Kitchen aspirin 325 MG tablet Take 1 tablet (325 mg total) by mouth daily.    . cevimeline (EVOXAC) 30 MG capsule TAKE 1 CAPSULE 3 TIMES A DAY FOR TREATMENT OF CHRONIC DRY MOUTH 270 capsule 3  . Cholecalciferol (VITAMIN D3) 1000 UNITS CAPS Take 1,000 Units by mouth daily.     . ciprofloxacin (CIPRO) 250 MG tablet 1 tab po qd for bladder infection prophylaxis 30 tablet 6  . fluticasone (FLONASE) 50 MCG/ACT nasal spray Place 2 sprays into both nostrils daily. 16 g 6  . losartan (COZAAR) 50 MG tablet Take 1 tablet (50 mg total) by mouth daily. 180 tablet 1  . nitroGLYCERIN (NITROSTAT) 0.4 MG SL tablet Place 1 tablet (0.4 mg total) under the tongue every 5 (five) minutes as needed for chest pain. 25 tablet 12  . Omega-3 Fatty Acids (FISH OIL) 1000 MG CAPS Take 1 capsule by mouth daily.    Marland Kitchen oxybutynin (DITROPAN) 5 MG tablet Take 5 mg by mouth every other day.  0  . pantoprazole (PROTONIX) 40 MG tablet Take 1 tablet (40 mg total) by mouth daily. 90 tablet 1  . polyethylene glycol powder (GLYCOLAX/MIRALAX) powder TAKE 17 G BY MOUTH 2 (TWO) TIMES DAILY. 527 g 5  . simvastatin (ZOCOR) 40 MG tablet TAKE 1 TABLET BY MOUTH EVERY DAY 90 tablet 1   No facility-administered medications prior to visit.     Allergies  Allergen Reactions  . Cefdinir Hives  . Macrobid [Nitrofurantoin Macrocrystal] Other (See Comments)    "I had chills & fever"     ROS As per HPI  PE: Blood pressure 110/65, pulse 67, temperature 98.1 F (36.7 C), temperature source Temporal, resp. rate 16, height 5\' 4"  (1.626 m), weight 127 lb 12.8 oz (58 kg), SpO2 97 %. Gen: Alert, well appearing.  Patient is oriented to person, place, time, and situation. AFFECT: pleasant, lucid thought and speech. No further exam today.  LABS:    Chemistry      Component Value Date/Time   NA 132 (L) 04/19/2018 1444   K 3.7 04/19/2018 1444  CL 96 (L) 04/19/2018 1444   CO2 26 04/19/2018 1444   BUN 18 04/19/2018 1444   CREATININE 0.85 04/19/2018 1444   CREATININE 0.83 06/29/2017 1425      Component Value Date/Time   CALCIUM 8.9 04/19/2018 1444   ALKPHOS 54 07/11/2016 1900   AST 17 06/29/2017 1425   ALT 12 06/29/2017 1425   BILITOT 0.4 06/29/2017 1425     Lab Results  Component Value Date   WBC 7.4 04/19/2018   HGB 11.7 (L) 04/19/2018   HCT 35.9 (L) 04/19/2018   MCV 90.4 04/19/2018   PLT 134 (L) 04/19/2018   Lab Results  Component Value Date   TSH 1.55 11/29/2015   Lab Results  Component Value Date   HGBA1C 5.8 (H) 05/30/2013   Lab Results  Component Value Date   CHOL 171 06/21/2015   HDL 71 06/21/2015   LDLCALC 95 06/21/2015   TRIG 26 06/21/2015   CHOLHDL 2.4 06/21/2015    IMPRESSION AND PLAN:  1) Mild cognitive impairment with memory problem:  Not quite to the point where I would call her impaired/dementia. I don't think this has progressed too much over the last few years I 've been seeing her. Doubt meds are contributing any to her cognitive/memory issues, but we agree to do a trial off of simvastatin x 1 mo and see how memory goes.  2) HTN: The current medical regimen is effective;  continue present plan and medications.  3) HLD: tolerating statin from a "usual side effect" standpoint, but see #1 above.  4) Recurrent UTIs: no problems lately.  Continue daily cipro.  5) Urge incontinence: stable on oxybutynin 5mg  qod. Her xerostomia  is improved lately/not bothering her near as much since getting her consistently on evoxac AND switching her ditropan from qd to qod.  An After Visit Summary was printed and given to the patient.  FOLLOW UP: Return in about 4 weeks (around 09/04/2018) for f/u memory impairment.  Signed:  Crissie Sickles, MD           08/07/2018

## 2018-08-19 ENCOUNTER — Other Ambulatory Visit: Payer: Self-pay | Admitting: Family Medicine

## 2018-08-19 NOTE — Telephone Encounter (Signed)
Copied from [place] [phone]. Topic: Quick Communication - Rx Refill/Question >> Aug 19, 2018  1:22 PM Blase Mess A wrote: Medication: ciprofloxacin (CIPRO) 250 MG tablet [[id number]]   Has the patient contacted their pharmacy? Yes  (Agent: If no, request that the patient contact the pharmacy for the refill.) (Agent: If yes, when and what did the pharmacy advise?)  Preferred Pharmacy (with phone number or street name): [id number] [place], [place] [phone] (Phone) [phone] (Fax)    Agent: Please be advised that RX refills may take up to 3 business days. We ask that you follow-up with your pharmacy.

## 2018-08-19 NOTE — Telephone Encounter (Signed)
[facility] and he stated that pt has 3 RF's on file.   Pt advised and voiced understanding.

## 2018-09-06 ENCOUNTER — Ambulatory Visit (INDEPENDENT_AMBULATORY_CARE_PROVIDER_SITE_OTHER): Payer: Medicare Other | Admitting: Family Medicine

## 2018-09-06 ENCOUNTER — Other Ambulatory Visit: Payer: Self-pay

## 2018-09-06 ENCOUNTER — Encounter: Payer: Self-pay | Admitting: Family Medicine

## 2018-09-06 ENCOUNTER — Ambulatory Visit: Payer: Medicare Other | Admitting: Family Medicine

## 2018-09-06 DIAGNOSIS — G3184 Mild cognitive impairment, so stated: Secondary | ICD-10-CM | POA: Diagnosis not present

## 2018-09-06 MED ORDER — FLUTICASONE PROPIONATE 50 MCG/ACT NA SUSP: 2.0000 | Freq: Every day | NASAL | 6 refills | Status: DC

## 2018-09-06 MED ORDER — CEVIMELINE HCL 30 MG PO CAPS: ORAL_CAPSULE | ORAL | 3 refills | Status: DC

## 2018-09-06 NOTE — Progress Notes (Signed)
OFFICE VISIT  09/06/2018   CC:  Chief Complaint  Patient presents with  . Follow-up    memory impairment   HPI:    Patient is a 85 y.o. Caucasian female With whom I am doing a telephone visit today (due to COVID-19 pandemic restrictions). Prior to proceeding with our visit today, the telephone visit process was explained  and pt gave consent to proceed and understood that this process would be used to assess and treat the patient.  This is a 1 mo f/u of her MCI with memory loss. Last visit we stopped her statin to see if this med was causing any cognitive/memory side effects for her.  Interim hx:  Feeling good. She recalls that we talked about her memory last visit. Recalls that we stopped her simvastatin. Says she thinks she may be a little better but then says she isn't sure. She is definitely easily confused on a chronic basis.    Past Medical History:  Diagnosis Date  . Abnormal finding on thyroid function test    per old records: T3 low, free T4 elevated, TSH normal.  Armour thyroid made her feel worse and T4 went up so med d/c'd and liothyronine 5 mcg tried but also made pt feel worse.  Endo (Dr. Loanne Drilling felt like Biotin was causing the abnormal TFT's).  Repeat TFTs OFF BIOTIN x 2 wks were NORMAL.  NO FURTHER THYROID TESTING NEEDED.  Marland Kitchen Arthritis    "think it's osteo; got some in my hands"  . Bifascicular block   . CAD (coronary artery disease)    Cath 2009  Occluded LAD, 40-50% RCA and circ managed medically.  Repeat 06/22/15 no change.  . Chronic constipation    slow transit.  Barium enema to eval for colonic stricture 10/2014 was NORMAL  . Cognitive dysfunction    short term memory loss--takes ginko biloba  . COPD (chronic obstructive pulmonary disease) ([place])    Changes noted on CXR 2017  . GERD (gastroesophageal reflux disease)    (also LPR) Schatzki's ring, s/p dil '97; food impaction 08/2010, on PPI therapy since w/out further sx so no dil performed  . History of  herpes zoster 06/2015   right side of chest; presented with chest pain mimicking [place]--cardiac eval showed stable CAD compared to 2009.  Marland Kitchen Hyperkalemia 06/2017   suspected due to increase of losartan from 25mg  bid to 50mg  bid.  Dose lowered back to 25 mg bid K normalized.  After increase again to 50 qAM and 25 qPM potassium 4.8 so I did not increase dose any further (07/20/17).  . Hyperlipidemia   . Hypertension   . Hyponatremia 10/03/2011   Na 131 on labs 02/2015 at [facility].  Baseline Na 132.  . Iron deficiency anemia 11/30/15   Hemoccults neg x 3 12/2015.  ?Mild malabsorption?  . Mixed stress and urge urinary incontinence   . RBBB with left anterior fascicular block    bifascicular block  . Recurrent UTI    Bactrim prophyl, then changed to cefdinir (?med rxn?).  Cipro qd as of 08/2018.  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 Thrombocytopenia ([place]) 02/2013   Plts 114K  . Uterine cancer ([place]) 2000  . Xerostomia 2017/2018   Age related glandular atrophy + med effect (oxybutynin and gabapentin).  ANA and sjogren's panel NEG 06/2017.    Past Surgical  History:  Procedure Laterality Date  . ABDOMINAL HYSTERECTOMY  2000  . APPENDECTOMY  2000  . CARDIAC CATHETERIZATION N/A 06/22/2015   Stable single vessel CAD, EF normal.  Procedure: Left Heart Cath and Coronary Angiography;  Surgeon: Leonie Man, MD;  Location: [place] CV LAB;  Service: Cardiovascular;  Laterality: N/A;  . CATARACT EXTRACTION W/ INTRAOCULAR LENS  IMPLANT, BILATERAL  1990's  . [place]; 1960  . COLONOSCOPY  04/2001   Neg; pt cancelled screening colon 06/2011  . DILATION AND CURETTAGE OF UTERUS    . HERNIA REPAIR     abdominal "twice"  . LEFT HEART CATHETERIZATION WITH CORONARY ANGIOGRAM N/A 10/04/2011   Procedure: LEFT HEART CATHETERIZATION WITH CORONARY ANGIOGRAM;  Surgeon: Jacolyn Reedy, MD;   Location: [facility] CATH LAB;  Service: Cardiovascular;  Laterality: N/A;  . REPAIR KNEE LIGAMENT  ~ 2008   left  . TONSILLECTOMY     as a child  . TRANSTHORACIC ECHOCARDIOGRAM  05/2013   EF 60-65%, trivial aortic regurg, no wall motion abnorm    Outpatient Medications Prior to Visit  Medication Sig Dispense Refill  . acetaminophen (TYLENOL) 325 MG tablet Take 325 mg by mouth every 6 (six) hours as needed.    Marland Kitchen aspirin 325 MG tablet Take 1 tablet (325 mg total) by mouth daily.    . Cholecalciferol (VITAMIN D3) 1000 UNITS CAPS Take 1,000 Units by mouth daily.     . ciprofloxacin (CIPRO) 250 MG tablet 1 tab po qd for bladder infection prophylaxis 30 tablet 6  . losartan (COZAAR) 50 MG tablet Take 1 tablet (50 mg total) by mouth daily. 180 tablet 1  . Omega-3 Fatty Acids (FISH OIL) 1000 MG CAPS Take 1 capsule by mouth daily.    Marland Kitchen oxybutynin (DITROPAN) 5 MG tablet Take 5 mg by mouth every other day.  0  . pantoprazole (PROTONIX) 40 MG tablet Take 1 tablet (40 mg total) by mouth daily. 90 tablet 1  . polyethylene glycol powder (GLYCOLAX/MIRALAX) powder TAKE 17 G BY MOUTH 2 (TWO) TIMES DAILY. 527 g 5  . cevimeline (EVOXAC) 30 MG capsule TAKE 1 CAPSULE 3 TIMES A DAY FOR TREATMENT OF CHRONIC DRY MOUTH 270 capsule 3  . fluticasone (FLONASE) 50 MCG/ACT nasal spray Place 2 sprays into both nostrils daily. 16 g 6  . simvastatin (ZOCOR) 40 MG tablet TAKE 1 TABLET BY MOUTH EVERY DAY 90 tablet 1  . nitroGLYCERIN (NITROSTAT) 0.4 MG SL tablet Place 1 tablet (0.4 mg total) under the tongue every 5 (five) minutes as needed for chest pain. (Patient not taking: Reported on 09/06/2018) 25 tablet 12   No facility-administered medications prior to visit.     Allergies  Allergen Reactions  . Cefdinir Hives  . Macrobid [Nitrofurantoin Macrocrystal] Other (See Comments)    "I had chills & fever"    ROS As per HPI  PE: There were no vitals taken for this visit.  Gen: Alert, NAD by voice/interaction on phone.   Patient is oriented to person, place, time, and situation. Lucid thought and speech. MMSE today = 27/30 (recall 2/3 and could not grasp the task of counting backwards from 100 by 7s. She spelled world forwards and backwards w/out problem.  LABS:    Chemistry      Component Value Date/Time   NA 132 (L) 04/19/2018 1444   K 3.7 04/19/2018 1444   CL 96 (L) 04/19/2018 1444   CO2 26 04/19/2018 1444   BUN 18 04/19/2018  1444   CREATININE 0.85 04/19/2018 1444   CREATININE 0.83 06/29/2017 1425      Component Value Date/Time   CALCIUM 8.9 04/19/2018 1444   ALKPHOS 54 07/11/2016 1900   AST 17 06/29/2017 1425   ALT 12 06/29/2017 1425   BILITOT 0.4 06/29/2017 1425      IMPRESSION AND PLAN:  1) MCI with memory loss: Hard to tell if any signif improvement OFF of her simvastatin. We'll give it 1 more month w/out simvastatin to see if any cognitive/memory improvement becomes more evident. Tried to discuss the possibility of starting a med like aricept, namenda, or exelon with her today but she kept perseverating about her medication list, trying to make sure she understood it and make sure about RF needs, etc. We reviewed list in detail TWICE today and basically everything will stay the same-->take all current meds on list EXCEPT she is to STAY off simvastatin still. Her ditropan qod seems to help some but not enough but I don't want to get her back on this qd or increase dose b/c of fear of return of intolerable dry mouth and/or make her cognitive impairment worse. Overall, she basically is just easily confused with more complex things like lists (esp med list) and with trying to multitask. This does not seem to have progressed significantly at all in the last few years that I've been seeing her. I do think her hearing impairment is making this problem worse for her.  Will bring up the topic of starting aricept or similar med at next f/u in 1 mo, and will repeat MMSE at that time.  Spent 30 min  with pt today, with >50% of this time spent in counseling and care coordination regarding the above problems.  FOLLOW UP: Return in about 4 weeks (around 10/04/2018) for f/u MCI w/memory imp.  Signed:  Crissie Sickles, MD           09/06/2018

## 2018-09-23 ENCOUNTER — Other Ambulatory Visit: Payer: Self-pay | Admitting: Cardiology

## 2018-09-23 NOTE — Telephone Encounter (Signed)
°*  STAT* If patient is at the pharmacy, call can be transferred to refill team.   1. Which medications need to be refilled? (please list name of each medication and dose if known) Losartan potassium 100mg  tablet, 1/2 tablet twice a day  2. Which pharmacy/location (including street and city if local pharmacy) [phone]  3. Do they need a 30 day or 90 day supply? [place]

## 2018-09-24 ENCOUNTER — Other Ambulatory Visit: Payer: Self-pay | Admitting: Family Medicine

## 2018-09-24 NOTE — Telephone Encounter (Signed)
Spoke with pharmacy and asked them to send this refill request to Dr. Ricardo Jericho. He filled it before and had changed med from Losartan 50 mg qd to 100 mg 1/2 twice daily. This is a Sabrina Alvarez pt who has never seen [place]. Pharmacy said they would send request to Dr. Anitra Lauth.

## 2018-09-27 DIAGNOSIS — Z791 Long term (current) use of non-steroidal anti-inflammatories (NSAID): Secondary | ICD-10-CM | POA: Diagnosis not present

## 2018-09-27 DIAGNOSIS — R9431 Abnormal electrocardiogram [ECG] [EKG]: Secondary | ICD-10-CM | POA: Diagnosis not present

## 2018-09-27 DIAGNOSIS — Z79899 Other long term (current) drug therapy: Secondary | ICD-10-CM | POA: Diagnosis not present

## 2018-09-27 DIAGNOSIS — I440 Atrioventricular block, first degree: Secondary | ICD-10-CM | POA: Diagnosis not present

## 2018-09-27 DIAGNOSIS — R531 Weakness: Secondary | ICD-10-CM | POA: Diagnosis not present

## 2018-09-27 DIAGNOSIS — R001 Bradycardia, unspecified: Secondary | ICD-10-CM | POA: Diagnosis not present

## 2018-09-27 DIAGNOSIS — E860 Dehydration: Secondary | ICD-10-CM | POA: Diagnosis not present

## 2018-09-27 DIAGNOSIS — Z7951 Long term (current) use of inhaled steroids: Secondary | ICD-10-CM | POA: Diagnosis not present

## 2018-09-27 DIAGNOSIS — R52 Pain, unspecified: Secondary | ICD-10-CM | POA: Diagnosis not present

## 2018-09-27 DIAGNOSIS — Z881 Allergy status to other antibiotic agents status: Secondary | ICD-10-CM | POA: Diagnosis not present

## 2018-09-27 DIAGNOSIS — Z7982 Long term (current) use of aspirin: Secondary | ICD-10-CM | POA: Diagnosis not present

## 2018-09-27 DIAGNOSIS — Z888 Allergy status to other drugs, medicaments and biological substances status: Secondary | ICD-10-CM | POA: Diagnosis not present

## 2018-09-27 DIAGNOSIS — M5489 Other dorsalgia: Secondary | ICD-10-CM | POA: Diagnosis not present

## 2018-09-27 DIAGNOSIS — R1011 Right upper quadrant pain: Secondary | ICD-10-CM | POA: Diagnosis not present

## 2018-09-27 DIAGNOSIS — R42 Dizziness and giddiness: Secondary | ICD-10-CM | POA: Diagnosis not present

## 2018-09-27 DIAGNOSIS — I4510 Unspecified right bundle-branch block: Secondary | ICD-10-CM | POA: Diagnosis not present

## 2018-09-27 DIAGNOSIS — I4430 Unspecified atrioventricular block: Secondary | ICD-10-CM | POA: Diagnosis not present

## 2018-10-04 ENCOUNTER — Ambulatory Visit (INDEPENDENT_AMBULATORY_CARE_PROVIDER_SITE_OTHER): Payer: Medicare Other | Admitting: Family Medicine

## 2018-10-04 ENCOUNTER — Telehealth: Payer: Self-pay | Admitting: Family Medicine

## 2018-10-04 ENCOUNTER — Encounter: Payer: Self-pay | Admitting: Family Medicine

## 2018-10-04 VITALS — BP 148/49

## 2018-10-04 DIAGNOSIS — I10 Essential (primary) hypertension: Secondary | ICD-10-CM

## 2018-10-04 DIAGNOSIS — G3184 Mild cognitive impairment, so stated: Secondary | ICD-10-CM | POA: Diagnosis not present

## 2018-10-04 NOTE — Progress Notes (Signed)
Virtual Visit via Video Note  I connected with Sabrina Alvarez on 10/04/18 at  2:00 PM EDT by telephone (Sabrina Alvarez did not have the technology necessary for video telehealth visit) and verified that I am speaking with the correct person using two identifiers.  Location patient: home Location provider:work or home office Persons participating in the virtual visit: patient, provider  I discussed the limitations of evaluation and management by telemedicine and the availability of in person appointments. The patient expressed understanding and agreed to proceed.  Telemedicine visit is a necessity given the COVID-19 restrictions in place at the current time.  HPI: 85 y/o WF being seen for f/u mild cognitive impairment w/memory loss. Last visit she was not noticeably improved after being off her statin for 1 mo. MMSE was 27/30 at that time. We decided to go 1 more month off statin to see if we could get any more idea of whether or not this med was causing any cognitive impairment.  Also planned on discussing possible trial of aricept today.  Interim hx: Sabrina Alvarez was seen in [facility] ED 09/27/18 for presyncopal episode. I reviewed all data from this visit today. Sabrina Alvarez with presyncope, o/w asymptomatic.  BP mildly elevated in ED, HR mild brady.  Orthostatics normal. EKG sinus brady _+ 1st deg HB and some PACs, o/w normal.  CXR nl, labs nl. Was given gentle IVF hydration.  Reports feeling well lately. Some mild soreness diffusely from a fall sustained at her son's home when she missed the final step on his stairs--a few days ago.  No head injury.  She had no acute pain/injury at all, but has felt some diffuse soreness since. Home bps have been in 188C-166A systolic, 63K and 16W systolic.  Denies dizziness, CP, SOB, palpitations, or HAs.  Says he memory is no worse, and possibly even a little improved over the last month. She says she just got an otc dietary supplement that is supposed to help with memory, just started  it. Discussed potential trial of rx med for memory in near future, decided to hold off for now.   ROS: See pertinent positives and negatives per HPI.  Past Medical History:  Diagnosis Date  . Abnormal finding on thyroid function test    per old records: T3 low, free T4 elevated, TSH normal.  Armour thyroid made her feel worse and T4 went up so med d/c'd and liothyronine 5 mcg tried but also made Sabrina Alvarez feel worse.  Endo (Dr. Loanne Drilling felt like Biotin was causing the abnormal TFT's).  Repeat TFTs OFF BIOTIN x 2 wks were NORMAL.  NO FURTHER THYROID TESTING NEEDED.  Marland Kitchen Arthritis    "think it's osteo; got some in my hands"  . Bifascicular block   . CAD (coronary artery disease)    Cath 2009  Occluded LAD, 40-50% RCA and circ managed medically.  Repeat 06/22/15 no change.  . Chronic constipation    slow transit.  Barium enema to eval for colonic stricture 10/2014 was NORMAL  . Cognitive dysfunction    short term memory loss--takes ginko biloba  . COPD (chronic obstructive pulmonary disease) ([place])    Changes noted on CXR 2017  . GERD (gastroesophageal reflux disease)    (also LPR) Schatzki's ring, s/p dil '97; food impaction 08/2010, on PPI therapy since w/out further sx so no dil performed  . History of herpes zoster 06/2015   right side of chest; presented with chest pain mimicking [place]--cardiac eval showed stable CAD compared to 2009.  Marland Kitchen Hyperkalemia  06/2017   suspected due to increase of losartan from 25mg  bid to 50mg  bid.  Dose lowered back to 25 mg bid K normalized.  After increase again to 50 qAM and 25 qPM potassium 4.8 so I did not increase dose any further (07/20/17).  . Hyperlipidemia   . Hypertension   . Hyponatremia 10/03/2011   Na 131 on labs 02/2015 at [facility].  Baseline Na 132.  . Iron deficiency anemia 11/30/15   Hemoccults neg x 3 12/2015.  ?Mild malabsorption?  . Mixed stress and urge urinary incontinence   . RBBB with left anterior fascicular block    bifascicular block  .  Recurrent UTI    Bactrim prophyl, then changed to cefdinir (?med rxn?).  Cipro qd as of 08/2018.  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 Thrombocytopenia ([place]) 02/2013   Plts 114K  . Uterine cancer ([place]) 2000  . Xerostomia 2017/2018   Age related glandular atrophy + med effect (oxybutynin and gabapentin).  ANA and sjogren's panel NEG 06/2017.    Past Surgical History:  Procedure Laterality Date  . ABDOMINAL HYSTERECTOMY  2000  . APPENDECTOMY  2000  . CARDIAC CATHETERIZATION N/A 06/22/2015   Stable single vessel CAD, EF normal.  Procedure: Left Heart Cath and Coronary Angiography;  Surgeon: Leonie Man, MD;  Location: [place] CV LAB;  Service: Cardiovascular;  Laterality: N/A;  . CATARACT EXTRACTION W/ INTRAOCULAR LENS  IMPLANT, BILATERAL  1990's  . [place]; 1960  . COLONOSCOPY  04/2001   Neg; Sabrina Alvarez cancelled screening colon 06/2011  . DILATION AND CURETTAGE OF UTERUS    . HERNIA REPAIR     abdominal "twice"  . LEFT HEART CATHETERIZATION WITH CORONARY ANGIOGRAM N/A 10/04/2011   Procedure: LEFT HEART CATHETERIZATION WITH CORONARY ANGIOGRAM;  Surgeon: Jacolyn Reedy, MD;  Location: [facility] CATH LAB;  Service: Cardiovascular;  Laterality: N/A;  . REPAIR KNEE LIGAMENT  ~ 2008   left  . TONSILLECTOMY     as a child  . TRANSTHORACIC ECHOCARDIOGRAM  05/2013   EF 60-65%, trivial aortic regurg, no wall motion abnorm    Family History  Problem Relation Age of Onset  . Arthritis Mother   . AAA (abdominal aortic aneurysm) Mother   . Diabetes Sister   . CVA Father   . Rheum arthritis Sister   . Cancer Neg Hx   . Heart disease Neg Hx   . Thyroid disease Neg Hx      Current Outpatient Medications:  .  acetaminophen (TYLENOL) 325 MG tablet, Take 325 mg by mouth every 6 (six) hours as needed., Disp: , Rfl:  .  aspirin 325 MG tablet, Take 1 tablet (325 mg total) by  mouth daily., Disp: , Rfl:  .  cevimeline (EVOXAC) 30 MG capsule, TAKE 1 CAPSULE 3 TIMES A DAY FOR TREATMENT OF CHRONIC DRY MOUTH, Disp: 270 capsule, Rfl: 3 .  Cholecalciferol (VITAMIN D3) 1000 UNITS CAPS, Take 1,000 Units by mouth daily. , Disp: , Rfl:  .  ciprofloxacin (CIPRO) 250 MG tablet, 1 tab po qd for bladder infection prophylaxis, Disp: 30 tablet, Rfl: 6 .  fluticasone (FLONASE) 50 MCG/ACT nasal spray, Place 2 sprays into both nostrils daily., Disp: 16 g, Rfl: 6 .  losartan (COZAAR) 50 MG tablet, TAKE 0.5 TABLETS (25 MG TOTAL) BY MOUTH 2 (TWO) TIMES DAILY., Disp: 90  tablet, Rfl: 1 .  nitroGLYCERIN (NITROSTAT) 0.4 MG SL tablet, Place 1 tablet (0.4 mg total) under the tongue every 5 (five) minutes as needed for chest pain. (Patient not taking: Reported on 09/06/2018), Disp: 25 tablet, Rfl: 12 .  Omega-3 Fatty Acids (FISH OIL) 1000 MG CAPS, Take 1 capsule by mouth daily., Disp: , Rfl:  .  oxybutynin (DITROPAN) 5 MG tablet, Take 5 mg by mouth every other day., Disp: , Rfl: 0 .  pantoprazole (PROTONIX) 40 MG tablet, Take 1 tablet (40 mg total) by mouth daily., Disp: 90 tablet, Rfl: 1 .  polyethylene glycol powder (GLYCOLAX/MIRALAX) powder, TAKE 17 G BY MOUTH 2 (TWO) TIMES DAILY., Disp: 527 g, Rfl: 5  EXAM:  VITALS per patient if applicable:BP (!) 388/82 (BP Location: Left Arm, Patient Position: Sitting, Cuff Size: Normal)  Gen: alert, oriented x 4, lucid thought and speech. She does occasionally have to have something repeated, particularly with things like med clarifications. No further exam b/c this is telephone visit.  LABS: none today    Chemistry      Component Value Date/Time   NA 132 (L) 04/19/2018 1444   K 3.7 04/19/2018 1444   CL 96 (L) 04/19/2018 1444   CO2 26 04/19/2018 1444   BUN 18 04/19/2018 1444   CREATININE 0.85 04/19/2018 1444   CREATININE 0.83 06/29/2017 1425      Component Value Date/Time   CALCIUM 8.9 04/19/2018 1444   ALKPHOS 54 07/11/2016 1900   AST 17  06/29/2017 1425   ALT 12 06/29/2017 1425   BILITOT 0.4 06/29/2017 1425      ASSESSMENT AND PLAN:  Discussed the following assessment and plan:  1) MCI, still w/out significant impairment in overall daily functioning. Decided to give her longer off of statin and ON her new OTC dietary supplement (?name?) to see if she improves, then likely try aricept if not improved at that time (at f/u in 3 mo).  2) HTN: some mild systolic elevations.  It is hard to tell if she is recalling correct bp numbers when she reports these.  I am leaning on the side of avoiding overtreatment at this time, so will continue her on current treatment of 1/2 of 50mg  losartan tab bid.  3) Urge incontinence/OAB: she responds to oxybutynin 12 hr but when taking it daily she cannot tolerate the excessive dry mouth  We've got her at a happy medium right now with taking it qod, but she keeps asking about taking it daily again.  I think she is going to hold off for now but if she does start back taking daily and is tolerating it she will likely need RF of this med with new sig.  Spent 15 min with Sabrina Alvarez today, with >50% of this time spent in counseling and care coordination regarding the above problems.  I discussed the assessment and treatment plan with the patient. The patient was provided an opportunity to ask questions and all were answered. The patient agreed with the plan and demonstrated an understanding of the instructions.   The patient was advised to call back or seek an in-person evaluation if the symptoms worsen or if the condition fails to improve as anticipated.  F/u: 3 mo f/u MCI and HTN.  BMET at that time.  Signed:  Crissie Sickles, MD           10/04/2018

## 2018-10-04 NOTE — Telephone Encounter (Signed)
FYI for appt today

## 2018-10-04 NOTE — Telephone Encounter (Signed)
Copied from [place] [id number]. Topic: General - Other >> Oct 04, 2018  2:26 PM Wynetta Emery, [place] C wrote: Reason for CRM: pt called back in to provide PCP with her BP reading. Showing pt had a visit today.   BP: 172/63

## 2018-11-24 ENCOUNTER — Other Ambulatory Visit: Payer: Self-pay | Admitting: Family Medicine

## 2019-02-03 ENCOUNTER — Other Ambulatory Visit: Payer: Self-pay | Admitting: Cardiology

## 2019-02-03 NOTE — Telephone Encounter (Signed)
°*  STAT* If patient is at the pharmacy, call can be transferred to refill team.   1. Which medications need to be refilled? (please list name of each medication and dose if known) Losartan potassium 100mg  tablet  2. Which pharmacy/location (including street and city if local pharmacy) [phone]  3. Do they need a 30 day or 90 day supply? [place]

## 2019-02-04 NOTE — Telephone Encounter (Signed)
Please advise if ok refill losartan 100 mg table qd #90 day refill request. Former Wynonia Lawman pt has upcoming appointment with Terie Purser 02/11/19 3:15.

## 2019-02-04 NOTE — Telephone Encounter (Signed)
Left message for patient to return call.  Losartan dose is not what Dr Thurman Coyer chart reflects.  Will verify dose with patient and send in refill.

## 2019-02-06 ENCOUNTER — Ambulatory Visit: Payer: Medicare Other | Admitting: Family

## 2019-02-06 ENCOUNTER — Other Ambulatory Visit: Payer: Self-pay

## 2019-02-06 ENCOUNTER — Telehealth: Payer: Self-pay | Admitting: Family Medicine

## 2019-02-06 MED ORDER — LOSARTAN POTASSIUM 50 MG PO TABS: ORAL_TABLET | ORAL | 0 refills | Status: DC

## 2019-02-06 NOTE — Telephone Encounter (Signed)
Pt was advised she should be taking 1/2 tab twice daily of 50mg . Spoke with Claiborne Billings at the pharmacy and confirmed Losartan is no longer on back order. Refill sent.

## 2019-02-06 NOTE — Telephone Encounter (Signed)
Patient requesting refill on  losartan (COZAAR) 50 MG tablet [id number]  [id number] [place]    Patient confused on what MG and how many to take and when. She got a message that 50 mg was on back order.  Please advise. Please call patient at [phone].

## 2019-02-11 ENCOUNTER — Ambulatory Visit: Payer: Medicare Other | Admitting: Family

## 2019-02-11 NOTE — Telephone Encounter (Signed)
Patient states that she does not wish to be seen by cardiologist at this time.  She states that she has losartan filled by her PCP.  Patient aware that we are here to help out with her cardiology care if she ever changes her mind.  Patient agreed to plan and verbalized understanding. No further questions.

## 2019-02-11 NOTE — Telephone Encounter (Signed)
Left message for patient to return call.  Will continue efforts.  

## 2019-03-06 DIAGNOSIS — Z23 Encounter for immunization: Secondary | ICD-10-CM | POA: Diagnosis not present

## 2019-03-10 ENCOUNTER — Other Ambulatory Visit: Payer: Self-pay | Admitting: Family Medicine

## 2019-03-19 DIAGNOSIS — H04123 Dry eye syndrome of bilateral lacrimal glands: Secondary | ICD-10-CM | POA: Diagnosis not present

## 2019-03-19 DIAGNOSIS — H524 Presbyopia: Secondary | ICD-10-CM | POA: Diagnosis not present

## 2019-04-22 DIAGNOSIS — N3281 Overactive bladder: Secondary | ICD-10-CM | POA: Insufficient documentation

## 2019-04-22 DIAGNOSIS — M8580 Other specified disorders of bone density and structure, unspecified site: Secondary | ICD-10-CM | POA: Insufficient documentation

## 2019-04-23 ENCOUNTER — Encounter: Payer: Self-pay | Admitting: Family Medicine

## 2019-04-23 ENCOUNTER — Ambulatory Visit (INDEPENDENT_AMBULATORY_CARE_PROVIDER_SITE_OTHER): Payer: Medicare Other | Admitting: Family Medicine

## 2019-04-23 ENCOUNTER — Other Ambulatory Visit: Payer: Self-pay

## 2019-04-23 VITALS — BP 163/81 | HR 60 | Temp 98.1°F | Resp 16 | Ht 64.0 in | Wt 128.0 lb

## 2019-04-23 DIAGNOSIS — M7051 Other bursitis of knee, right knee: Secondary | ICD-10-CM | POA: Diagnosis not present

## 2019-04-23 DIAGNOSIS — I10 Essential (primary) hypertension: Secondary | ICD-10-CM | POA: Diagnosis not present

## 2019-04-23 NOTE — Progress Notes (Signed)
OFFICE VISIT  04/23/2019   CC:  Chief Complaint  Patient presents with  . Knee Pain    right, couple of days    HPI:    Patient is a 85 y.o. Caucasian female with MCI, HTN, and recurrent UTIs who presents for right knee pain.  Tough historian due to mild/mod MCI. Off and on pain in medial aspect of R knee, worse last several days.  Hurts only when walking on it.  Intensity mild and she can walk through it fine.  No locking up or buckling. No trauma or twist injury. Tylenol helps it some.  Notes some swelling in the area of pain.  Past Medical History:  Diagnosis Date  . Abnormal finding on thyroid function test    per old records: T3 low, free T4 elevated, TSH normal.  [place] thyroid made her feel worse and T4 went up so med d/c'd and liothyronine 5 mcg tried but also made pt feel worse.  Endo (Dr. Loanne Drilling felt like Biotin was causing the abnormal TFT's).  Repeat TFTs OFF BIOTIN x 2 wks were NORMAL.  NO FURTHER THYROID TESTING NEEDED.  Marland Kitchen Arthritis    "think it's osteo; got some in my hands"  . Bifascicular block   . CAD (coronary artery disease)    Cath 2009  Occluded LAD, 40-50% RCA and circ managed medically.  Repeat 06/22/15 no change.  . Chronic constipation    slow transit.  Barium enema to eval for colonic stricture 10/2014 was NORMAL  . Cognitive dysfunction    short term memory loss--takes ginko biloba  . COPD (chronic obstructive pulmonary disease) ([place])    Changes noted on CXR 2017  . GERD (gastroesophageal reflux disease)    (also LPR) Schatzki's ring, s/p dil '97; food impaction 08/2010, on PPI therapy since w/out further sx so no dil performed  . History of herpes zoster 06/2015   right side of chest; presented with chest pain mimicking [place]--cardiac eval showed stable CAD compared to 2009.  Marland Kitchen Hyperkalemia 06/2017   suspected due to increase of losartan from 25mg  bid to 50mg  bid.  Dose lowered back to 25 mg bid K normalized.  After increase again to 50 qAM and 25 qPM  potassium 4.8 so I did not increase dose any further (07/20/17).  . Hyperlipidemia   . Hypertension   . Hyponatremia 10/03/2011   Na 131 on labs 02/2015 at [facility].  Baseline Na 132.  . Iron deficiency anemia 11/30/15   Hemoccults neg x 3 12/2015.  ?Mild malabsorption?  . Mixed stress and urge urinary incontinence   . RBBB with left anterior fascicular block    bifascicular block  . Recurrent UTI    Bactrim prophyl, then changed to cefdinir (?med rxn?).  Cipro qd as of 08/2018.  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 Thrombocytopenia ([place]) 02/2013   Plts 114K  . Uterine cancer ([place]) 2000  . Xerostomia 2017/2018   Age related glandular atrophy + med effect (oxybutynin and gabapentin).  ANA and sjogren's panel NEG 06/2017.    Past Surgical History:  Procedure Laterality Date  . ABDOMINAL HYSTERECTOMY  2000  . APPENDECTOMY  2000  . CARDIAC CATHETERIZATION N/A 06/22/2015   Stable single vessel CAD, EF normal.  Procedure: Left Heart Cath and Coronary Angiography;  Surgeon: Leonie Man, MD;  Location: [facility];  Service: Cardiovascular;  Laterality: N/A;  . CATARACT EXTRACTION W/ INTRAOCULAR LENS  IMPLANT, BILATERAL  1990's  . [place]; 1960  . COLONOSCOPY  04/2001   Neg; pt cancelled screening colon 06/2011  . DILATION AND CURETTAGE OF UTERUS    . HERNIA REPAIR     abdominal "twice"  . LEFT HEART CATHETERIZATION WITH CORONARY ANGIOGRAM N/A 10/04/2011   Procedure: LEFT HEART CATHETERIZATION WITH CORONARY ANGIOGRAM;  Surgeon: Jacolyn Reedy, MD;  Location: [facility] CATH LAB;  Service: Cardiovascular;  Laterality: N/A;  . REPAIR KNEE LIGAMENT  ~ 2008   left  . TONSILLECTOMY     as a child  . TRANSTHORACIC ECHOCARDIOGRAM  05/2013   EF 60-65%, trivial aortic regurg, no wall motion abnorm    Outpatient Medications Prior to Visit  Medication Sig Dispense  Refill  . acetaminophen (TYLENOL) 325 MG tablet Take 325 mg by mouth every 6 (six) hours as needed.    Marland Kitchen aspirin 325 MG tablet Take 1 tablet (325 mg total) by mouth daily.    . cevimeline (EVOXAC) 30 MG capsule TAKE 1 CAPSULE 3 TIMES A DAY FOR TREATMENT OF CHRONIC DRY MOUTH 270 capsule 3  . Cholecalciferol (VITAMIN D3) 1000 UNITS CAPS Take 1,000 Units by mouth daily.     . ciprofloxacin (CIPRO) 250 MG tablet TAKE 1 TABLET BY MOUTH EVERY DAY FOR BLADDER INFECTION PROPHYLAXIS 30 tablet 6  . fluticasone (FLONASE) 50 MCG/ACT nasal spray SPRAY 2 SPRAYS INTO EACH NOSTRIL EVERY DAY 48 mL 0  . losartan (COZAAR) 50 MG tablet TAKE 0.5 TABLETS (25 MG TOTAL) BY MOUTH 2 (TWO) TIMES DAILY. 90 tablet 0  . Omega-3 Fatty Acids (FISH OIL) 1000 MG CAPS Take 1 capsule by mouth daily.    Marland Kitchen oxybutynin (DITROPAN) 5 MG tablet Take 5 mg by mouth every other day.  0  . pantoprazole (PROTONIX) 40 MG tablet Take 1 tablet (40 mg total) by mouth daily. 90 tablet 1  . polyethylene glycol powder (GLYCOLAX/MIRALAX) powder TAKE 17 G BY MOUTH 2 (TWO) TIMES DAILY. 527 g 5  . nitroGLYCERIN (NITROSTAT) 0.4 MG SL tablet Place 1 tablet (0.4 mg total) under the tongue every 5 (five) minutes as needed for chest pain. (Patient not taking: Reported on 04/23/2019) 25 tablet 12   No facility-administered medications prior to visit.     Allergies  Allergen Reactions  . Cefdinir Hives  . Macrobid [Nitrofurantoin Macrocrystal] Other (See Comments)    "I had chills & fever"    ROS As per HPI  PE: BP recheck 132/78 Blood pressure (!) 163/81, pulse 60, temperature 98.1 F (36.7 C), temperature source Temporal, resp. rate 16, height 5\' 4"  (1.626 m), weight 128 lb (58.1 kg), SpO2 100 %. Gen: Alert, well appearing.  Patient is oriented to person, place, time, and situation. AFFECT: pleasant, lucid thought and speech. R knee with no signif STS, no effusion.  Full ROM without any signif pain.  McMurray's neg. No instability.  Mild TTP over  distal SGT complex and at it's insertion/pes anserine bursa. No erythema or excessive warmth.  Otherwise no tenderness anywhere.  Patellar grind minimally pos. No crepitus.  LABS:    Chemistry      Component Value Date/Time   NA 132 (L) 04/19/2018 1444   K 3.7 04/19/2018 1444   CL 96 (L) 04/19/2018 1444   CO2 26 04/19/2018 1444   BUN 18 04/19/2018 1444   CREATININE 0.85 04/19/2018 1444   CREATININE 0.83 06/29/2017 1425  Component Value Date/Time   CALCIUM 8.9 04/19/2018 1444   ALKPHOS 54 07/11/2016 1900   AST 17 06/29/2017 1425   ALT 12 06/29/2017 1425   BILITOT 0.4 06/29/2017 1425     Lab Results  Component Value Date   WBC 7.4 04/19/2018   HGB 11.7 (L) 04/19/2018   HCT 35.9 (L) 04/19/2018   MCV 90.4 04/19/2018   PLT 134 (L) 04/19/2018   Lab Results  Component Value Date   IRON 86 11/29/2015   IRON 41 (L) 11/29/2015   TIBC 322 11/29/2015   FERRITIN 22.3 12/28/2015    IMPRESSION AND PLAN:  1) R knee pain, suspect pes anserine bursitis. Sx's mild at this time. Ice 20 min bid, relative rest, tylenol q6h prn.  2) HTN: stable.  An After Visit Summary was printed and given to the patient.  FOLLOW UP: Return if symptoms worsen or fail to improve.  Signed:  Crissie Sickles, MD           04/23/2019

## 2019-04-23 NOTE — Patient Instructions (Signed)
Pes Anserine Bursitis  The pes anserine is an area on the inside of your knee, just below the joint, that is cushioned by a fluid-filled sac (bursa). Pes anserine bursitis is a condition that happens when the bursa gets swollen and irritated. The condition causes knee pain. What are the causes? This condition may be caused by:  Making the same movement over and over.  A direct hit (trauma) to the inside of the leg. What increases the risk? You are more likely to develop this condition if you:  Are a runner.  Play sports that involve a lot of running and quick side-to-side movements (cutting).  Are an athlete who plays contact sports.  Swim using an inward angle of the knee, such as with the breaststroke.  Have tight hamstring muscles.  Are a woman.  Are overweight.  Have flat feet.  Have diabetes or osteoarthritis. What are the signs or symptoms? Symptoms of this condition include:  Knee pain that gets better with rest and worse with activities like climbing stairs, walking, running, or getting in and out of a chair.  Swelling.  Warmth.  Tenderness when pressing at the inside of the lower leg, just below the knee. How is this diagnosed? This condition may be diagnosed based on:  Your symptoms.  Your medical history.  A physical exam. ? During your physical exam, your health care provider will press on the tendon attachment to see if you feel pain. ? Your health care provider may also check your hip and knee motion and strength.  Tests to check for swelling and fluid buildup in the bursa and to look at muscles, bones, and tendons. These tests might include: ? X-rays. ? MRI. ? Ultrasound. How is this treated? This condition may be treated by:  Resting your knee. You may be told to raise (elevate) your knee while resting.  Avoiding activities that cause pain.  Icing the inside of your knee.  Sleeping with a pillow between your knees. This will cushion your  injured knee.  Taking medicine by mouth (orally) to reduce pain and swelling or having medicine injected into your knee.  Doing strengthening and stretching exercises (physical therapy). If these treatments do not work or if the condition keeps coming back, you may need to have surgery to remove the bursa. Follow these instructions at home: Managing pain, stiffness, and swelling   If directed, put ice on the injured area. ? Put ice in a plastic bag. ? Place a towel between your skin and the bag. ? Leave the ice on for 20 minutes, 2-3 times a day.  Elevate the injured area above the level of your heart while you are sitting or lying down. Activity  Return to your normal activities as told by your health care provider. Ask your health care provider what activities are safe for you.  Do exercises as told by your health care provider. General instructions  Take over-the-counter and prescription medicines only as told by your health care provider.  Sleep with a pillow between your knees.  Do not use any products that contain nicotine or tobacco, such as cigarettes, e-cigarettes, and chewing tobacco. These can delay healing. If you need help quitting, ask your health care provider.  If you are overweight, work with your health care provider and a dietitian to set a weight-loss goal that is healthy and reasonable for you.  Keep all follow-up visits as told by your health care provider. This is important. How is this prevented?    When exercising, make sure that you: ? Warm up and stretch before being active. ? Cool down and stretch after being active. ? Give your body time to rest between periods of activity. ? Use equipment that fits you. ? Are safe and responsible while being active to avoid falls. ? Do at least 150 minutes of moderate-intensity exercise each week, such as brisk walking or water aerobics. ? Maintain physical fitness, including:  Strength.  Flexibility.   Cardiovascular fitness.  Endurance. ? Maintain a healthy weight. Contact a health care provider if:  Your symptoms do not improve.  Your symptoms get worse. Summary  Pes anserine bursitis is a condition that happens when the fluid-filled sac (bursa) at the inside of your knee gets swollen and irritated. The condition causes knee pain.  Treatment for pes anserine bursitis may include resting your knee, icing the inside of your knee, sleeping with a pillow between your knees, taking medicine by mouth or by injection, and doing strengthening and stretching exercises (physical therapy).  Follow instructions for managing pain, stiffness, and swelling.  Take over-the-counter and prescription medicines only as told by your health care provider. This information is not intended to replace advice given to you by your health care provider. Make sure you discuss any questions you have with your health care provider. Document Released: 05/22/2005 Document Revised: 09/12/2018 Document Reviewed: 10/31/2017 Elsevier Patient Education  2020 [employer].

## 2019-05-02 ENCOUNTER — Other Ambulatory Visit: Payer: Self-pay | Admitting: Family Medicine

## 2019-05-23 ENCOUNTER — Other Ambulatory Visit: Payer: Self-pay | Admitting: Family Medicine

## 2019-06-09 ENCOUNTER — Other Ambulatory Visit: Payer: Self-pay | Admitting: Family Medicine

## 2019-06-28 ENCOUNTER — Other Ambulatory Visit: Payer: Self-pay | Admitting: Family Medicine

## 2019-06-30 NOTE — Telephone Encounter (Signed)
RF request for ciprofloxacin LOV:04/23/19 Next ov:pt due for f/u RCi, last 10/04/18  Last written:11/25/18 (30,6)

## 2019-07-30 ENCOUNTER — Other Ambulatory Visit: Payer: Self-pay | Admitting: Family Medicine

## 2019-08-13 DIAGNOSIS — H16223 Keratoconjunctivitis sicca, not specified as Sjogren's, bilateral: Secondary | ICD-10-CM | POA: Diagnosis not present

## 2019-08-13 DIAGNOSIS — H52223 Regular astigmatism, bilateral: Secondary | ICD-10-CM | POA: Diagnosis not present

## 2019-08-13 DIAGNOSIS — Z961 Presence of intraocular lens: Secondary | ICD-10-CM | POA: Diagnosis not present

## 2019-08-13 DIAGNOSIS — H524 Presbyopia: Secondary | ICD-10-CM | POA: Diagnosis not present

## 2019-08-15 ENCOUNTER — Other Ambulatory Visit: Payer: Self-pay | Admitting: Family Medicine

## 2019-08-15 NOTE — Telephone Encounter (Signed)
RF request for oxybutynin LOV:04/23/19 Next ov: f/u as needed Last written: 11/08/17   Please advise if refill appropriate, thanks. Medication pending Med not originally prescribed by you.

## 2019-08-30 DIAGNOSIS — I672 Cerebral atherosclerosis: Secondary | ICD-10-CM | POA: Diagnosis not present

## 2019-08-30 DIAGNOSIS — Z7982 Long term (current) use of aspirin: Secondary | ICD-10-CM | POA: Diagnosis not present

## 2019-08-30 DIAGNOSIS — R55 Syncope and collapse: Secondary | ICD-10-CM | POA: Diagnosis not present

## 2019-08-30 DIAGNOSIS — Z888 Allergy status to other drugs, medicaments and biological substances status: Secondary | ICD-10-CM | POA: Diagnosis not present

## 2019-08-30 DIAGNOSIS — Z881 Allergy status to other antibiotic agents status: Secondary | ICD-10-CM | POA: Diagnosis not present

## 2019-08-30 DIAGNOSIS — K219 Gastro-esophageal reflux disease without esophagitis: Secondary | ICD-10-CM | POA: Diagnosis not present

## 2019-08-30 DIAGNOSIS — E860 Dehydration: Secondary | ICD-10-CM | POA: Diagnosis not present

## 2019-08-30 DIAGNOSIS — I10 Essential (primary) hypertension: Secondary | ICD-10-CM | POA: Diagnosis not present

## 2019-08-30 DIAGNOSIS — R001 Bradycardia, unspecified: Secondary | ICD-10-CM | POA: Diagnosis not present

## 2019-08-30 DIAGNOSIS — R4020 Unspecified coma: Secondary | ICD-10-CM | POA: Diagnosis not present

## 2019-08-30 DIAGNOSIS — I4510 Unspecified right bundle-branch block: Secondary | ICD-10-CM | POA: Diagnosis not present

## 2019-08-30 DIAGNOSIS — Z8551 Personal history of malignant neoplasm of bladder: Secondary | ICD-10-CM | POA: Diagnosis not present

## 2019-08-30 DIAGNOSIS — Z79899 Other long term (current) drug therapy: Secondary | ICD-10-CM | POA: Diagnosis not present

## 2019-08-30 DIAGNOSIS — R32 Unspecified urinary incontinence: Secondary | ICD-10-CM | POA: Diagnosis not present

## 2019-08-30 DIAGNOSIS — R0902 Hypoxemia: Secondary | ICD-10-CM | POA: Diagnosis not present

## 2019-08-30 DIAGNOSIS — R69 Illness, unspecified: Secondary | ICD-10-CM | POA: Diagnosis not present

## 2019-08-30 DIAGNOSIS — R531 Weakness: Secondary | ICD-10-CM | POA: Diagnosis not present

## 2019-09-03 ENCOUNTER — Telehealth: Payer: Self-pay

## 2019-09-03 ENCOUNTER — Other Ambulatory Visit: Payer: Self-pay

## 2019-09-03 NOTE — Telephone Encounter (Signed)
Patient has taken miralax powder and been able to go to the bathroom. Nothing further needed

## 2019-09-03 NOTE — Telephone Encounter (Signed)
Patient is very constipated. Patient's son would like to know what she can do.

## 2019-09-03 NOTE — Telephone Encounter (Signed)
Contacted pt's son, Dellis Filbert and he is on his way to her home right now. Will call back in 10 minutes and see what she has tried already.

## 2019-09-08 ENCOUNTER — Encounter: Payer: Self-pay | Admitting: Family Medicine

## 2019-09-08 ENCOUNTER — Other Ambulatory Visit: Payer: Self-pay

## 2019-09-08 ENCOUNTER — Ambulatory Visit (INDEPENDENT_AMBULATORY_CARE_PROVIDER_SITE_OTHER): Payer: Medicare HMO | Admitting: Family Medicine

## 2019-09-08 VITALS — BP 152/78 | HR 60 | Temp 98.2°F | Resp 16 | Ht 64.0 in | Wt 130.6 lb

## 2019-09-08 DIAGNOSIS — I10 Essential (primary) hypertension: Secondary | ICD-10-CM | POA: Diagnosis not present

## 2019-09-08 DIAGNOSIS — R710 Precipitous drop in hematocrit: Secondary | ICD-10-CM | POA: Diagnosis not present

## 2019-09-08 DIAGNOSIS — I951 Orthostatic hypotension: Secondary | ICD-10-CM

## 2019-09-08 DIAGNOSIS — G3184 Mild cognitive impairment, so stated: Secondary | ICD-10-CM

## 2019-09-08 DIAGNOSIS — D649 Anemia, unspecified: Secondary | ICD-10-CM | POA: Diagnosis not present

## 2019-09-08 DIAGNOSIS — K117 Disturbances of salivary secretion: Secondary | ICD-10-CM

## 2019-09-08 DIAGNOSIS — E860 Dehydration: Secondary | ICD-10-CM | POA: Diagnosis not present

## 2019-09-08 DIAGNOSIS — Z862 Personal history of diseases of the blood and blood-forming organs and certain disorders involving the immune mechanism: Secondary | ICD-10-CM

## 2019-09-08 DIAGNOSIS — N3281 Overactive bladder: Secondary | ICD-10-CM

## 2019-09-08 NOTE — Patient Instructions (Addendum)
Make sure your aspirin dose is 81 mg.  Take this once daily. Do not take the 325mg  aspirin.   Use tylenol (generic is fine) for any pain (try to avoid ibuprofen and aleve).  Buy biotene oral rinse for use to help your dry mouth.

## 2019-09-08 NOTE — Progress Notes (Signed)
OFFICE VISIT  09/08/2019   CC:  Chief Complaint  Patient presents with  . Follow-up    MCI, HTN    HPI:    Patient is a 86 y.o. Caucasian female who presents accompanied by her son Dellis Filbert for f/u mild cognitive impairment with memory loss as well as for HTN. Reviewed chart since last visit with me 04/2019: she went to [facility] on 08/30/19 for vasovagal syncope that occurred while she was pulling weeds, dx'd with mild dehydration at that time as well. Entire record from that visit reviewed today.  The only significant lab abnormality was Hb 8.8.  EKG neg acute, CXR nl, noncontrast head CT neg, CT head/neck angio showed mild/mod vert art stenosis, o/w good. D/c'd home after IVF.  Last visit with me was telemed f/u 10/2018: A/P at that time: 1) MCI, still w/out significant impairment in overall daily functioning. Decided to give her longer off of statin and ON her new OTC dietary supplement (?name?) to see if she improves, then likely try aricept if not improved at that time (at f/u in 3 mo).  2) HTN: some mild systolic elevations.  It is hard to tell if she is recalling correct bp numbers when she reports these.  I am leaning on the side of avoiding overtreatment at this time, so will continue her on current treatment of 1/2 of 50mg  losartan tab bid.  3) Urge incontinence/OAB: she responds to oxybutynin 12 hr but when taking it daily she cannot tolerate the excessive dry mouth  We've got her at a happy medium right now with taking it qod, but she keeps asking about taking it daily again.  I think she is going to hold off for now but if she does start back taking daily and is tolerating it she will likely need RF of this med with new sig."  Currently :  Feeling better.  Denies any further problem with orthostatic dizziness.  Says this has not been a common symptom for her lately, either. Hydrating better since ED visit.  Up to 60 oz water in a day.   I told her about her low Hb detected at  recent ED visit.   No melena or hematochezia.  Not taking iron supp.  Occ stool is black colored but is formed.   She doesn't take iron supplement. Still doing fairly well on oxybutynin qod for her OAB, and evoxac qd (sometimes bid or tid depending on how it's helping or whether she remembers the dose or not).  Not using biotene rinse any; food sometimes sticks to her teeth. No home bp monitoring being done but she is compliant with med.  She says here memory is essentially unchanged. Son is here with her today: he has noted some decline gradually.  Nothing acute or profound. She tends to ask same question a couple times, sometimes will ask for clarification of something that is said to her 1 minute before.  Some hearing issues contribute to her overall cognitive function. Recently got hearing aids adjusted. She remains active at home/in yard.  Is independent in all ADLs.  ROS: no fevers, no CP, no SOB, no wheezing, no cough, no HAs, no rashes, no melena/hematochezia.  No polyuria or polydipsia.  No myalgias or arthralgias.  No focal weakness, paresthesias, or tremors.  No acute vision or hearing abnormalities. No n/v/d or abd pain.  No palpitations.    Past Medical History:  Diagnosis Date  . Abnormal finding on thyroid function test  per old records: T3 low, free T4 elevated, TSH normal.  Armour thyroid made her feel worse and T4 went up so med d/c'd and liothyronine 5 mcg tried but also made pt feel worse.  Endo (Dr. Loanne Drilling felt like Biotin was causing the abnormal TFT's).  Repeat TFTs OFF BIOTIN x 2 wks were NORMAL.  NO FURTHER THYROID TESTING NEEDED.  Marland Kitchen Arthritis    "think it's osteo; got some in my hands"  . Bifascicular block   . CAD (coronary artery disease)    Cath 2009  Occluded LAD, 40-50% RCA and circ managed medically.  Repeat 06/22/15 no change.  . Chronic constipation    slow transit.  Barium enema to eval for colonic stricture 10/2014 was NORMAL  . Cognitive dysfunction     short term memory loss--takes ginko biloba  . COPD (chronic obstructive pulmonary disease) ([place])    Changes noted on CXR 2017  . GERD (gastroesophageal reflux disease)    (also LPR) Schatzki's ring, s/p dil '97; food impaction 08/2010, on PPI therapy since w/out further sx so no dil performed  . History of herpes zoster 06/2015   right side of chest; presented with chest pain mimicking USA--cardiac eval showed stable CAD compared to 2009.  Marland Kitchen Hyperkalemia 06/2017   suspected due to increase of losartan from 25mg  bid to 50mg  bid.  Dose lowered back to 25 mg bid K normalized.  After increase again to 50 qAM and 25 qPM potassium 4.8 so I did not increase dose any further (07/20/17).  . Hyperlipidemia   . Hypertension   . Hyponatremia 10/03/2011   Na 131 on labs 02/2015 at [facility].  Baseline Na 132.  . Iron deficiency anemia 11/30/15   Hemoccults neg x 3 12/2015.  ?Mild malabsorption?  . Mixed stress and urge urinary incontinence   . RBBB with left anterior fascicular block    bifascicular block  . Recurrent UTI    Bactrim prophyl, then changed to cefdinir (?med rxn?).  Cipro qd as of 08/2018.  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 Thrombocytopenia ([place]) 02/2013   Plts 114K  . Uterine cancer ([place]) 2000  . Xerostomia 2017/2018   Age related glandular atrophy + med effect (oxybutynin and gabapentin).  ANA and sjogren's panel NEG 06/2017.    Past Surgical History:  Procedure Laterality Date  . ABDOMINAL HYSTERECTOMY  2000  . APPENDECTOMY  2000  . CARDIAC CATHETERIZATION N/A 06/22/2015   Stable single vessel CAD, EF normal.  Procedure: Left Heart Cath and Coronary Angiography;  Surgeon: Leonie Man, MD;  Location: [facility];  Service: Cardiovascular;  Laterality: N/A;  . CATARACT EXTRACTION W/ INTRAOCULAR LENS  IMPLANT, BILATERAL  1990's  . [place]; 1960  .  COLONOSCOPY  04/2001   Neg; pt cancelled screening colon 06/2011  . DILATION AND CURETTAGE OF UTERUS    . HERNIA REPAIR     abdominal "twice"  . LEFT HEART CATHETERIZATION WITH CORONARY ANGIOGRAM N/A 10/04/2011   Procedure: LEFT HEART CATHETERIZATION WITH CORONARY ANGIOGRAM;  Surgeon: Jacolyn Reedy, MD;  Location: [facility] CATH LAB;  Service: Cardiovascular;  Laterality: N/A;  . REPAIR KNEE LIGAMENT  ~ 2008   left  . TONSILLECTOMY     as a child  . TRANSTHORACIC ECHOCARDIOGRAM  05/2013   EF 60-65%, trivial aortic regurg, no wall motion  abnorm    Outpatient Medications Prior to Visit  Medication Sig Dispense Refill  . cevimeline (EVOXAC) 30 MG capsule TAKE 1 CAPSULE 3 TIMES A DAY FOR TREATMENT OF CHRONIC DRY MOUTH 270 capsule 3  . Cholecalciferol (VITAMIN D3) 1000 UNITS CAPS Take 1,000 Units by mouth daily.     . ciprofloxacin (CIPRO) 250 MG tablet TAKE 1 TABLET BY MOUTH EVERY DAY FOR BLADDER INFECTION PROPHYLAXIS 90 tablet 1  . fluticasone (FLONASE) 50 MCG/ACT nasal spray SPRAY 2 SPRAYS INTO EACH NOSTRIL EVERY DAY 48 mL 0  . losartan (COZAAR) 50 MG tablet TAKE 1/2 TABLET (25 MG TOTAL) BY MOUTH 2 (TWO) TIMES DAILY. 90 tablet 0  . Omega-3 Fatty Acids (FISH OIL) 1000 MG CAPS Take 1 capsule by mouth daily.    Marland Kitchen oxybutynin (DITROPAN) 5 MG tablet 1 tab po every other day 45 tablet 3  . pantoprazole (PROTONIX) 40 MG tablet Take 1 tablet (40 mg total) by mouth daily. 90 tablet 1  . polyethylene glycol powder (GLYCOLAX/MIRALAX) powder TAKE 17 G BY MOUTH 2 (TWO) TIMES DAILY. 527 g 5  . acetaminophen (TYLENOL) 325 MG tablet Take 325 mg by mouth every 6 (six) hours as needed.    Marland Kitchen aspirin 325 MG tablet Take 1 tablet (325 mg total) by mouth daily. (Patient not taking: Reported on 09/08/2019)    . nitroGLYCERIN (NITROSTAT) 0.4 MG SL tablet Place 1 tablet (0.4 mg total) under the tongue every 5 (five) minutes as needed for chest pain. (Patient not taking: Reported on 04/23/2019) 25 tablet 12  . Biotin 2.5 MG  CAPS biotin 2,500 mcg capsule  Take by oral route.    . tretinoin (RETIN-A) 0.05 % cream tretinoin 0.05 % topical cream     No facility-administered medications prior to visit.    Allergies  Allergen Reactions  . Cefdinir Hives  . Macrobid [Nitrofurantoin Macrocrystal] Other (See Comments)    "I had chills & fever"    ROS As per HPI  PE: Blood pressure (!) 152/78, pulse 60, temperature 98.2 F (36.8 C), temperature source Temporal, resp. rate 16, height 5\' 4"  (1.626 m), weight 130 lb 9.6 oz (59.2 kg), SpO2 99 %. Body mass index is 22.42 kg/m.  Gen: Alert, well appearing.  Patient is oriented to person, place, time, and situation. AFFECT: pleasant, lucid thought and speech. CV: RRR, 2/6 systolic murmur heard best at LUSB, split S2 during inspiration.  No diastolic murmur.  No r/g.   LUNGS: CTA bilat, nonlabored resps, good aeration in all lung fields. EXT: no clubbing or cyanosis.  2+ R LL pitting edema, 1+ L LL pitting.   LABS:   Lab Results  Component Value Date   FERRITIN 22.3 12/28/2015   Lab Results  Component Value Date   VITAMINB12 680 02/24/2016      Chemistry      Component Value Date/Time   NA 132 (L) 04/19/2018 1444   K 3.7 04/19/2018 1444   CL 96 (L) 04/19/2018 1444   CO2 26 04/19/2018 1444   BUN 18 04/19/2018 1444   CREATININE 0.85 04/19/2018 1444   CREATININE 0.83 06/29/2017 1425      Component Value Date/Time   CALCIUM 8.9 04/19/2018 1444   ALKPHOS 54 07/11/2016 1900   AST 17 06/29/2017 1425   ALT 12 06/29/2017 1425   BILITOT 0.4 06/29/2017 1425     Lab Results  Component Value Date   WBC 7.4 04/19/2018   HGB 11.7 (L) 04/19/2018   HCT 35.9 (L)  04/19/2018   MCV 90.4 04/19/2018   PLT 134 (L) 04/19/2018   Lab Results  Component Value Date   HGBA1C 5.8 (H) 05/30/2013   IMPRESSION AND PLAN:  1) Anemia, normocytic (Hb 8.8 at ED visit 08/30/19).  Hx of iron def anemia, hemoccults neg--> a few years ago. Her Hb never dropped below 11 at  that time, though.   Doesn't seem like she ever supplemented iron. Will check CBC, iron panel, and do home hemoccults x 3. Will await lab results before starting iron supplement.  2) Mild cognitive impairment with memory loss: fairly stable.  Monitor/observe-->she continues to function fine but her family keeps a close eye on her.  3) OAB: stable on qod oxybutynin 5mg . Continue evoxac for dry mouth this med causes her, also add biotene rinse prior to eating.  4) HTN: our goal for her is 0000000 systolic, 0000000 diastolic. She gets hypotension/orthostatic dizziness too easily when we go for tighter bp control. Recent lytes/cr in ED visit were normal.  5) Orthostatic hypotension->syncope.  Acute mild dehydration (+ low Hb). Hydrating better now.  Working up low Hb as per #1 above.  Spent 45 min with pt today discussing the recent ED eval and her above sx's, diagnoses, and plans with pt and her son.  An After Visit Summary was printed and given to the patient.  FOLLOW UP: Return in about 3 months (around 12/08/2019) for routine chronic illness f/u.  Signed:  Crissie Sickles, MD           09/08/2019

## 2019-09-09 LAB — IRON,TIBC AND FERRITIN PANEL
%SAT: 6 % (calc) — ABNORMAL LOW (ref 16–45)
Ferritin: 10 ng/mL — ABNORMAL LOW (ref 16–288)
Iron: 20 ug/dL — ABNORMAL LOW (ref 45–160)
TIBC: 313 mcg/dL (calc) (ref 250–450)

## 2019-09-09 LAB — CBC
HCT: 28.4 % — ABNORMAL LOW (ref 35.0–45.0)
Hemoglobin: 8.8 g/dL — ABNORMAL LOW (ref 11.7–15.5)
MCH: 25.1 pg — ABNORMAL LOW (ref 27.0–33.0)
MCHC: 31 g/dL — ABNORMAL LOW (ref 32.0–36.0)
MCV: 80.9 fL (ref 80.0–100.0)
MPV: 13.2 fL — ABNORMAL HIGH (ref 7.5–12.5)
Platelets: 166 10*3/uL (ref 140–400)
RBC: 3.51 10*6/uL — ABNORMAL LOW (ref 3.80–5.10)
RDW: 14.5 % (ref 11.0–15.0)
WBC: 3.9 10*3/uL (ref 3.8–10.8)

## 2019-09-16 ENCOUNTER — Other Ambulatory Visit: Payer: Medicare HMO

## 2019-09-16 ENCOUNTER — Encounter: Payer: Self-pay | Admitting: Family Medicine

## 2019-09-16 ENCOUNTER — Other Ambulatory Visit: Payer: Self-pay

## 2019-09-16 DIAGNOSIS — R710 Precipitous drop in hematocrit: Secondary | ICD-10-CM

## 2019-09-16 DIAGNOSIS — Z862 Personal history of diseases of the blood and blood-forming organs and certain disorders involving the immune mechanism: Secondary | ICD-10-CM

## 2019-09-16 DIAGNOSIS — D500 Iron deficiency anemia secondary to blood loss (chronic): Secondary | ICD-10-CM

## 2019-09-16 LAB — HEMOCCULT SLIDES (X 3 CARDS)
Fecal Occult Blood: NEGATIVE
OCCULT 1: POSITIVE — AB
OCCULT 2: POSITIVE — AB
OCCULT 3: POSITIVE — AB
OCCULT 4: NEGATIVE
OCCULT 5: NEGATIVE

## 2019-09-16 MED ORDER — PANTOPRAZOLE SODIUM 40 MG PO TBEC: 40.0000 mg | DELAYED_RELEASE_TABLET | Freq: Every day | ORAL | 1 refills | Status: DC

## 2019-09-24 ENCOUNTER — Other Ambulatory Visit: Payer: Self-pay

## 2019-09-24 ENCOUNTER — Telehealth: Payer: Self-pay

## 2019-09-24 MED ORDER — CIPROFLOXACIN HCL 250 MG PO TABS: ORAL_TABLET | ORAL | 1 refills | Status: DC

## 2019-09-24 MED ORDER — CEVIMELINE HCL 30 MG PO CAPS: ORAL_CAPSULE | ORAL | 3 refills | Status: DC

## 2019-09-24 NOTE — Telephone Encounter (Signed)
Patient called in stating that she was not able to fill her medication    cevimeline (EVOXAC) 30 MG capsule  ciprofloxacin (CIPRO) 250 MG tablet   [id number] - [place], [place] - [address] AT [address]   Please advise

## 2019-09-24 NOTE — Telephone Encounter (Signed)
Refills sent in. Pt notified.

## 2019-10-03 ENCOUNTER — Telehealth: Payer: Self-pay

## 2019-10-03 NOTE — Telephone Encounter (Signed)
Son Zoann Aguallo called about his Mom (DPR verified). He is confused about some calls that his mom is getting about a iron levels and gastro referrals

## 2019-10-06 ENCOUNTER — Other Ambulatory Visit: Payer: Self-pay

## 2019-10-06 ENCOUNTER — Ambulatory Visit (HOSPITAL_BASED_OUTPATIENT_CLINIC_OR_DEPARTMENT_OTHER)
Admission: RE | Admit: 2019-10-06 | Discharge: 2019-10-06 | Disposition: A | Discharge status: Home / Self Care | Payer: Medicare HMO | Source: Ambulatory Visit | Attending: Family Medicine | Admitting: Family Medicine

## 2019-10-06 ENCOUNTER — Encounter: Payer: Self-pay | Admitting: Family Medicine

## 2019-10-06 ENCOUNTER — Telehealth: Payer: Self-pay

## 2019-10-06 ENCOUNTER — Ambulatory Visit (INDEPENDENT_AMBULATORY_CARE_PROVIDER_SITE_OTHER): Payer: Medicare HMO | Admitting: Family Medicine

## 2019-10-06 VITALS — BP 140/78 | HR 61 | Temp 98.0°F | Resp 16 | Ht 64.0 in | Wt 128.0 lb

## 2019-10-06 DIAGNOSIS — D500 Iron deficiency anemia secondary to blood loss (chronic): Secondary | ICD-10-CM

## 2019-10-06 DIAGNOSIS — M7989 Other specified soft tissue disorders: Secondary | ICD-10-CM | POA: Insufficient documentation

## 2019-10-06 DIAGNOSIS — L539 Erythematous condition, unspecified: Secondary | ICD-10-CM

## 2019-10-06 DIAGNOSIS — M7121 Synovial cyst of popliteal space [Baker], right knee: Secondary | ICD-10-CM | POA: Diagnosis not present

## 2019-10-06 MED ORDER — AMOXICILLIN-POT CLAVULANATE 875-125 MG PO TABS
1.0000 | ORAL_TABLET | Freq: Two times a day (BID) | ORAL | 0 refills | Status: DC
Start: 2019-10-06 — End: 2019-12-15

## 2019-10-06 NOTE — Telephone Encounter (Signed)
LM for pt's son, Heron Sabins to return call.

## 2019-10-06 NOTE — Telephone Encounter (Signed)
At the end of Last week patient started getting Right leg Swelling, redness, no pain, and red line that started at bottom of foot/ankle and now is halfway to calf but runs more in a horizontal direction. No fever. Feels warm to touch.  No injury. Pts son was advised that patient should go to ED for possible blood clot so imaging could be completed. Pts son did not want to do that and wanted message sent to Dr Anitra Lauth and wanted to send picture via My chart. Pts son was advised we cannot diagnose from a picture but I would send message to Dr Anitra Lauth.  Please call Clovia Cuff back  [phone]

## 2019-10-06 NOTE — Telephone Encounter (Signed)
Per Dr Anitra Lauth pt can come in at 3:30. Pts son was called and scheduled. He is aware this a in office visit

## 2019-10-06 NOTE — Telephone Encounter (Signed)
Agree 

## 2019-10-06 NOTE — Telephone Encounter (Signed)
[facility] - Client Nonclinical Telephone Record  [facility] Client [facility] - Client Client Site [place] - Day Physician Crissie Sickles - MD Contact Type Call Who Is Calling Patient / Member / Family / Caregiver Caller Name Fareeha Okula Phone Number [phone] Patient Name Sabrina Alvarez Patient DOB 1927-07-22 Call Type Message Only Information Provided Reason for Call Request to Schedule Office Appointment  Initial Comment Caller states he is needing to schedule an appt for his mother who is having swelling from her right knee down to her ankle.  Disp. Time Disposition Final User 10/05/2019 2:17:14 PM General Information Provided Yes Britt Boozer Call Closed By: Britt Boozer Transaction Date/Time: 10/05/2019 2:14:19 PM (ET)

## 2019-10-06 NOTE — Telephone Encounter (Signed)
Spoke with pt's son, Dellis Filbert regarding GI referral to [facility]. Pt has not been contacted for appt yet, he is going to call their office back. Apparently 2 attempts have been made to pt for scheduling while son was not there with pt.

## 2019-10-06 NOTE — Progress Notes (Signed)
OFFICE VISIT  10/06/2019   CC:  Chief Complaint  Patient presents with  . Right leg swelling    redness on leg, warm to touch since last Friday 4/30   HPI:    Patient is a 86 y.o. Caucasian female who presents accompanied by her son for right leg swelling. Onset approx 4-5 d/a, RLL swelling, redness anteriorly. Redness is going up leg further now.  No pain.  No fevers. No recent compression stocking on either leg. Her legs swelling in ankles some bilat chronically.  She does not feel like this is much worse lately.    Past Medical History:  Diagnosis Date  . Abnormal finding on thyroid function test    per old records: T3 low, free T4 elevated, TSH normal.  Armour thyroid made her feel worse and T4 went up so med d/c'd and liothyronine 5 mcg tried but also made pt feel worse.  Endo (Dr. Loanne Drilling felt like Biotin was causing the abnormal TFT's).  Repeat TFTs OFF BIOTIN x 2 wks were NORMAL.  NO FURTHER THYROID TESTING NEEDED.  Marland Kitchen Arthritis    "think it's osteo; got some in my hands"  . Bifascicular block   . CAD (coronary artery disease)    Cath 2009  Occluded LAD, 40-50% RCA and circ managed medically.  Repeat 06/22/15 no change.  . Chronic constipation    slow transit.  Barium enema to eval for colonic stricture 10/2014 was NORMAL  . Cognitive dysfunction    short term memory loss--takes ginko biloba  . COPD (chronic obstructive pulmonary disease) ([place])    Changes noted on CXR 2017  . GERD (gastroesophageal reflux disease)    (also LPR) Schatzki's ring, s/p dil '97; food impaction 08/2010, on PPI therapy since w/out further sx so no dil performed  . History of herpes zoster 06/2015   right side of chest; presented with chest pain mimicking USA--cardiac eval showed stable CAD compared to 2009.  Marland Kitchen Hyperkalemia 06/2017   suspected due to increase of losartan from 25mg  bid to 50mg  bid.  Dose lowered back to 25 mg bid K normalized.  After increase again to 50 qAM and 25 qPM potassium 4.8  so I did not increase dose any further (07/20/17).  . Hyperlipidemia   . Hypertension   . Hyponatremia 10/03/2011   Na 131 on labs 02/2015 at [facility].  Baseline Na 132.  . Iron deficiency anemia 11/30/15; 09/2019   Hemoccults neg x 3 12/2015.  ?Mild malabsorption?  09/2019 Hb drop to 8.5, iron low, hemoccult +. Started iron, PPI, and referred to GI (Dr. Cristina Gong).  . Mixed stress and urge urinary incontinence   . RBBB with left anterior fascicular block    bifascicular block  . Recurrent UTI    Bactrim prophyl, then changed to cefdinir (?med rxn?).  Cipro qd as of 08/2018.  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 Thrombocytopenia ([place]) 02/2013   Plts 114K  . Uterine cancer ([place]) 2000  . Xerostomia 2017/2018   Age related glandular atrophy + med effect (oxybutynin and gabapentin).  ANA and sjogren's panel NEG 06/2017.    Past Surgical History:  Procedure Laterality Date  . ABDOMINAL HYSTERECTOMY  2000  . APPENDECTOMY  2000  . CARDIAC CATHETERIZATION N/A 06/22/2015   Stable single vessel CAD, EF normal.  Procedure: Left Heart Cath and Coronary Angiography;  Surgeon: Leonie Man, MD;  Location: [facility];  Service: Cardiovascular;  Laterality: N/A;  . CATARACT EXTRACTION W/ INTRAOCULAR LENS  IMPLANT, BILATERAL  1990's  . [place]; 1960  . COLONOSCOPY  04/2001   Neg; pt cancelled screening colon 06/2011  . DILATION AND CURETTAGE OF UTERUS    . HERNIA REPAIR     abdominal "twice"  . LEFT HEART CATHETERIZATION WITH CORONARY ANGIOGRAM N/A 10/04/2011   Procedure: LEFT HEART CATHETERIZATION WITH CORONARY ANGIOGRAM;  Surgeon: Jacolyn Reedy, MD;  Location: [facility] CATH LAB;  Service: Cardiovascular;  Laterality: N/A;  . REPAIR KNEE LIGAMENT  ~ 2008   left  . TONSILLECTOMY     as a child  . TRANSTHORACIC ECHOCARDIOGRAM  05/2013   EF 60-65%, trivial aortic regurg, no  wall motion abnorm    Outpatient Medications Prior to Visit  Medication Sig Dispense Refill  . acetaminophen (TYLENOL) 325 MG tablet Take 325 mg by mouth every 6 (six) hours as needed.    . cevimeline (EVOXAC) 30 MG capsule TAKE 1 CAPSULE 3 TIMES A DAY FOR TREATMENT OF CHRONIC DRY MOUTH 270 capsule 3  . Cholecalciferol (VITAMIN D3) 1000 UNITS CAPS Take 1,000 Units by mouth daily.     . ciprofloxacin (CIPRO) 250 MG tablet TAKE 1 TABLET BY MOUTH EVERY DAY FOR BLADDER INFECTION PROPHYLAXIS. 90 tablet 1  . fluticasone (FLONASE) 50 MCG/ACT nasal spray SPRAY 2 SPRAYS INTO EACH NOSTRIL EVERY DAY 48 mL 0  . losartan (COZAAR) 50 MG tablet TAKE 1/2 TABLET (25 MG TOTAL) BY MOUTH 2 (TWO) TIMES DAILY. 90 tablet 0  . Omega-3 Fatty Acids (FISH OIL) 1000 MG CAPS Take 1 capsule by mouth daily.    Marland Kitchen oxybutynin (DITROPAN) 5 MG tablet 1 tab po every other day 45 tablet 3  . pantoprazole (PROTONIX) 40 MG tablet Take 1 tablet (40 mg total) by mouth daily. 30 tablet 1  . polyethylene glycol powder (GLYCOLAX/MIRALAX) powder TAKE 17 G BY MOUTH 2 (TWO) TIMES DAILY. 527 g 5  . nitroGLYCERIN (NITROSTAT) 0.4 MG SL tablet Place 1 tablet (0.4 mg total) under the tongue every 5 (five) minutes as needed for chest pain. (Patient not taking: Reported on 04/23/2019) 25 tablet 12  . aspirin 325 MG tablet Take 1 tablet (325 mg total) by mouth daily. (Patient not taking: Reported on 09/08/2019)     No facility-administered medications prior to visit.    Allergies  Allergen Reactions  . Cefdinir Hives  . Macrobid [Nitrofurantoin Macrocrystal] Other (See Comments)    "I had chills & fever"    ROS As per HPI  PE: Blood pressure 140/78, pulse 61, temperature 98 F (36.7 C), temperature source Temporal, resp. rate 16, height 5\' 4"  (1.626 m), weight 128 lb (58.1 kg), SpO2 95 %. Gen: Alert, well appearing.  Patient is oriented to person, place, time, and situation. AFFECT: pleasant, lucid thought and speech. R lower leg with 2+  pitting edema from mid tibia down into foot, similar amount of pitting in same region on L. Clearly demarcated rectangle shaped area of erythema that only minimally blanches is located on anterior tibial region of R lower leg, approx 12-14 cm top to bottom and about 8-10 cm side to side. No tenderness to palpation.  No excessive warmth over the rash/skin discoloration.  No calf pain/tenderness, no cord palpable.  No streaking.  LABS:    Chemistry      Component Value Date/Time   NA 132 (L) 04/19/2018  1444   K 3.7 04/19/2018 1444   CL 96 (L) 04/19/2018 1444   CO2 26 04/19/2018 1444   BUN 18 04/19/2018 1444   CREATININE 0.85 04/19/2018 1444   CREATININE 0.83 06/29/2017 1425      Component Value Date/Time   CALCIUM 8.9 04/19/2018 1444   ALKPHOS 54 07/11/2016 1900   AST 17 06/29/2017 1425   ALT 12 06/29/2017 1425   BILITOT 0.4 06/29/2017 1425     Lab Results  Component Value Date   WBC 3.9 09/08/2019   HGB 8.8 (L) 09/08/2019   HCT 28.4 (L) 09/08/2019   MCV 80.9 09/08/2019   PLT 166 09/08/2019   IMPRESSION AND PLAN:  1) Acute R LL erythema with mild increase over her baseline pitting edema on R. No pain, plus pt feeling well. Does not fit clearly with superficial thrombophlebitis, DVT, cellulitis, or venous stasis dermatitis. Also in differential although less likely are pretibial myxedema and petechial rash (tick-borne illness). The odd shape of her erythematous patch of skin (with clear/linear demarcation at superior and inferior borders) is perplexing. Plan is to check venous doppler u/s to r/o DVT. Also, BMET and d-dimer (stat). Recommended she also start augmentin 875mg  bid x 10d for possible cellulitis. NO NSAIDs b/c has IDA that is from chronic GI bleeding until proven otherwise. Elevate, monitor for extension past the pen lines I drew today around the current borders of her skin erythema.  Additional labs: CBC and retic count-->hx of recent IDA, hemoccult pos, now on  iron for about the last month. Sounds like she is still having black stools (formed) but unclear how much this has been prior to getting on iron vs only after getting on iron.  An After Visit Summary was printed and given to the patient.  FOLLOW UP: Return for f/u in person 3-4 d to recheck R leg.  Signed:  Crissie Sickles, MD           10/06/2019

## 2019-10-07 LAB — BASIC METABOLIC PANEL

## 2019-10-07 LAB — CBC
HCT: 33.6 % — ABNORMAL LOW (ref 35.0–45.0)
Hemoglobin: 10.4 g/dL — ABNORMAL LOW (ref 11.7–15.5)
MCH: 26 pg — ABNORMAL LOW (ref 27.0–33.0)
MCHC: 31 g/dL — ABNORMAL LOW (ref 32.0–36.0)
MCV: 84 fL (ref 80.0–100.0)
MPV: 12.4 fL (ref 7.5–12.5)
Platelets: 184 10*3/uL (ref 140–400)
RBC: 4 10*6/uL (ref 3.80–5.10)
RDW: 17.1 % — ABNORMAL HIGH (ref 11.0–15.0)
WBC: 4.6 10*3/uL (ref 3.8–10.8)

## 2019-10-07 LAB — D-DIMER, QUANTITATIVE: D-Dimer, Quant: 2.89 mcg/mL FEU — ABNORMAL HIGH (ref <0.50)

## 2019-10-07 LAB — RETICULOCYTES
ABS Retic: 40000 cells/uL (ref 20000–8000)
Retic Ct Pct: 1 %

## 2019-10-08 ENCOUNTER — Ambulatory Visit: Payer: Medicare HMO | Admitting: Family Medicine

## 2019-10-08 ENCOUNTER — Encounter: Payer: Self-pay | Admitting: Family Medicine

## 2019-10-08 DIAGNOSIS — D500 Iron deficiency anemia secondary to blood loss (chronic): Secondary | ICD-10-CM | POA: Diagnosis not present

## 2019-10-08 DIAGNOSIS — R000 Tachycardia, unspecified: Secondary | ICD-10-CM | POA: Diagnosis not present

## 2019-10-08 DIAGNOSIS — R195 Other fecal abnormalities: Secondary | ICD-10-CM | POA: Diagnosis not present

## 2019-10-09 ENCOUNTER — Encounter: Payer: Self-pay | Admitting: Family Medicine

## 2019-10-09 ENCOUNTER — Other Ambulatory Visit: Payer: Self-pay

## 2019-10-09 ENCOUNTER — Ambulatory Visit (INDEPENDENT_AMBULATORY_CARE_PROVIDER_SITE_OTHER): Payer: Medicare HMO | Admitting: Family Medicine

## 2019-10-09 VITALS — BP 129/66 | HR 63 | Temp 97.8°F | Resp 16 | Ht 64.0 in | Wt 127.0 lb

## 2019-10-09 DIAGNOSIS — L03115 Cellulitis of right lower limb: Secondary | ICD-10-CM | POA: Diagnosis not present

## 2019-10-09 DIAGNOSIS — D500 Iron deficiency anemia secondary to blood loss (chronic): Secondary | ICD-10-CM | POA: Diagnosis not present

## 2019-10-09 NOTE — Progress Notes (Signed)
OFFICE VISIT  10/10/2019   CC:  Chief Complaint  Patient presents with  . Leg Swelling    Here for follow up   HPI:    Patient is a 86 y.o. Caucasian female who presents accompanied by her son for 3 day f/u right lower leg swelling and erythema. A/P as of last visit: "Acute R LL erythema with mild increase over her baseline pitting edema on R. No pain, plus pt feeling well. Does not fit clearly with superficial thrombophlebitis, DVT, cellulitis, or venous stasis dermatitis. Also in differential although less likely are pretibial myxedema and petechial rash (tick-borne illness). The odd shape of her erythematous patch of skin (with clear/linear demarcation at superior and inferior borders) is perplexing. Plan is to check venous doppler u/s to r/o DVT. Also, BMET and d-dimer (stat). Recommended she also start augmentin 875mg  bid x 10d for possible cellulitis. NO NSAIDs b/c has IDA that is from chronic GI bleeding until proven otherwise. Elevate, monitor for extension past the pen lines I drew today around the current borders of her skin erythema.  Additional labs: CBC and retic count-->hx of recent IDA, hemoccult pos, now on iron for about the last month. Sounds like she is still having black stools (formed) but unclear how much this has been prior to getting on iron vs only after getting on iron."  INTERIM HX: Her RLL venous doppler was NEG for DVT. She is improved.  No side effects from augmentin. Redness on RLL has diminished significantly.  Still covering exact same patch of skin as before. No pain or itching.  Small patch same color on L anterior tibial surface (3 cm or so--borders no straight and distinct like R side).   She saw Dr. Cristina Gong yesterday.  Dr. Cristina Gong called me to let me know she had a regularly irreg rhythm with rate 130 on exam, asymptomatic.  His GI office does not have an EKG.   He also said that since her Hb has come up on oral iron and she was hemoccult NEG  on his exam yesterday-->obs is likely the most prudent approach to her recent IDA (with home hemoccult pos at my initial w/u). If w/u was to be pursued then he said he would start with barium enema.   No palpitations, dizziness, feeling of heart racing, CP, SOB, DOE, or LE swelling.  Past Medical History:  Diagnosis Date  . Abnormal finding on thyroid function test    per old records: T3 low, free T4 elevated, TSH normal.  [place]our thyroid made her feel worse and T4 went up so med d/c'd and liothyronine 5 mcg tried but also made pt feel worse.  Endo (Dr. Loanne Drilling felt like Biotin was causing the abnormal TFT's).  Repeat TFTs OFF BIOTIN x 2 wks were NORMAL.  NO FURTHER THYROID TESTING NEEDED.  Marland Kitchen Arthritis    "think it's osteo; got some in my hands"  . Bifascicular block   . CAD (coronary artery disease)    Cath 2009  Occluded LAD, 40-50% RCA and circ managed medically.  Repeat 06/22/15 no change.  . Chronic constipation    slow transit.  Barium enema to eval for colonic stricture 10/2014 was NORMAL  . Cognitive dysfunction    short term memory loss--takes ginko biloba  . COPD (chronic obstructive pulmonary disease) ([place])    Changes noted on CXR 2017  . GERD (gastroesophageal reflux disease)    (also LPR) Schatzki's ring, s/p dil '97; food impaction 08/2010, on PPI therapy since  w/out further sx so no dil performed  . History of herpes zoster 06/2015   right side of chest; presented with chest pain mimicking USA--cardiac eval showed stable CAD compared to 2009.  Marland Kitchen Hyperkalemia 06/2017   suspected due to increase of losartan from 25mg  bid to 50mg  bid.  Dose lowered back to 25 mg bid K normalized.  After increase again to 50 qAM and 25 qPM potassium 4.8 so I did not increase dose any further (07/20/17).  . Hyperlipidemia   . Hypertension   . Hyponatremia 10/03/2011   Na 131 on labs 02/2015 at [facility].  Baseline Na 132.  . Iron deficiency anemia 11/30/15; 09/2019   Hemoccults neg x 3 12/2015.  ?Mild  malabsorption?  09/2019 Hb drop to 8.5, iron low, hemoccult +. Started iron, PPI, and referred to GI (Dr. Cristina Gong).  . Mixed stress and urge urinary incontinence   . RBBB with left anterior fascicular block    bifascicular block  . Recurrent UTI    Bactrim prophyl, then changed to cefdinir (?med rxn?).  Cipro qd as of 08/2018.  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 Thrombocytopenia ([place]) 02/2013   Plts 114K  . Uterine cancer ([place]) 2000  . Xerostomia 2017/2018   Age related glandular atrophy + med effect (oxybutynin and gabapentin).  ANA and sjogren's panel NEG 06/2017.    Past Surgical History:  Procedure Laterality Date  . ABDOMINAL HYSTERECTOMY  2000  . APPENDECTOMY  2000  . CARDIAC CATHETERIZATION N/A 06/22/2015   Stable single vessel CAD, EF normal.  Procedure: Left Heart Cath and Coronary Angiography;  Surgeon: Leonie Man, MD;  Location: [place] CV LAB;  Service: Cardiovascular;  Laterality: N/A;  . CATARACT EXTRACTION W/ INTRAOCULAR LENS  IMPLANT, BILATERAL  1990's  . [place]; 1960  . COLONOSCOPY  04/2001   Neg; pt cancelled screening colon 06/2011  . DILATION AND CURETTAGE OF UTERUS    . HERNIA REPAIR     abdominal "twice"  . LEFT HEART CATHETERIZATION WITH CORONARY ANGIOGRAM N/A 10/04/2011   Procedure: LEFT HEART CATHETERIZATION WITH CORONARY ANGIOGRAM;  Surgeon: Jacolyn Reedy, MD;  Location: [facility] CATH LAB;  Service: Cardiovascular;  Laterality: N/A;  . REPAIR KNEE LIGAMENT  ~ 2008   left  . TONSILLECTOMY     as a child  . TRANSTHORACIC ECHOCARDIOGRAM  05/2013   EF 60-65%, trivial aortic regurg, no wall motion abnorm    Outpatient Medications Prior to Visit  Medication Sig Dispense Refill  . acetaminophen (TYLENOL) 325 MG tablet Take 325 mg by mouth every 6 (six) hours as needed.    Marland Kitchen amoxicillin-clavulanate (AUGMENTIN) 875-125 MG tablet  Take 1 tablet by mouth 2 (two) times daily. 20 tablet 0  . cevimeline (EVOXAC) 30 MG capsule TAKE 1 CAPSULE 3 TIMES A DAY FOR TREATMENT OF CHRONIC DRY MOUTH 270 capsule 3  . Cholecalciferol (VITAMIN D3) 1000 UNITS CAPS Take 1,000 Units by mouth daily.     . ciprofloxacin (CIPRO) 250 MG tablet TAKE 1 TABLET BY MOUTH EVERY DAY FOR BLADDER INFECTION PROPHYLAXIS. 90 tablet 1  . fluticasone (FLONASE) 50 MCG/ACT nasal spray SPRAY 2 SPRAYS INTO EACH NOSTRIL EVERY DAY 48 mL 0  . losartan (COZAAR) 50 MG tablet TAKE 1/2 TABLET (25 MG TOTAL) BY MOUTH 2 (TWO) TIMES DAILY. 90 tablet 0  . nitroGLYCERIN (NITROSTAT)  0.4 MG SL tablet Place 1 tablet (0.4 mg total) under the tongue every 5 (five) minutes as needed for chest pain. 25 tablet 12  . Omega-3 Fatty Acids (FISH OIL) 1000 MG CAPS Take 1 capsule by mouth daily.    Marland Kitchen oxybutynin (DITROPAN) 5 MG tablet 1 tab po every other day 45 tablet 3  . pantoprazole (PROTONIX) 40 MG tablet Take 1 tablet (40 mg total) by mouth daily. 30 tablet 1  . polyethylene glycol powder (GLYCOLAX/MIRALAX) powder TAKE 17 G BY MOUTH 2 (TWO) TIMES DAILY. 527 g 5   No facility-administered medications prior to visit.    Allergies  Allergen Reactions  . Cefdinir Hives  . Macrobid [Nitrofurantoin Macrocrystal] Other (See Comments)    "I had chills & fever"    ROS As per HPI  PE: Vitals with BMI 10/09/2019 10/06/2019 09/08/2019  Height 5\' 4"  5\' 4"  5\' 4"   Weight 127 lbs 128 lbs 130 lbs 10 oz  BMI 21.79 [id number]  Systolic Q000111Q [id number]  Diastolic 66 78 78  Pulse 63 61 60    Gen: Alert, well appearing.  Patient is oriented to person, place, time, and situation. AFFECT: pleasant, lucid thought and speech. Gen: Alert, well appearing.  Patient is oriented to person, place, time, and situation. AFFECT: pleasant, lucid thought and speech. CV: RRR, 1/6 syst murmur, no diastolic murmur, no rub/gallop. No ectopic beats or pauses. EXT: no edema.  R LL with light pinkish hue to the  documented area of rash/erythema. I do think some of this does not blanch with pressure. Similar appearing 3 cm oblong patch on L pretibial region w/out distinct borders. No leg tenderness or streaking or asymmetry.  LABS:  Lab Results  Component Value Date   WBC 4.6 10/06/2019   HGB 10.4 (L) 10/06/2019   HCT 33.6 (L) 10/06/2019   MCV 84.0 10/06/2019   PLT 184 10/06/2019   Lab Results  Component Value Date   IRON 20 (L) 09/08/2019   TIBC 313 09/08/2019   FERRITIN 10 (L) 09/08/2019     Chemistry      Component Value Date/Time   NA 132 (L) 04/19/2018 1444   K 3.7 04/19/2018 1444   CL 96 (L) 04/19/2018 1444   CO2 26 04/19/2018 1444   BUN 18 04/19/2018 1444   CREATININE 0.85 04/19/2018 1444   CREATININE 0.83 06/29/2017 1425      Component Value Date/Time   CALCIUM 8.9 04/19/2018 1444   ALKPHOS 54 07/11/2016 1900   AST 17 06/29/2017 1425   ALT 12 06/29/2017 1425   BILITOT 0.4 06/29/2017 1425     Lab Results  Component Value Date   TSH 1.55 11/29/2015   IMPRESSION AND PLAN:  1) R LL erythema: unclear etiology but seems to be responding to antibiotics. Finish augmentin.  We discussed the fact that this may actually be some atypical appearance of venous stasis dermatitis. She does'nt have edema but has many non-inflamed varicosities bilat.  2) Iron def anemia, hem + stool: She has had a reassuring increase in hb after being on oral iron for 1 mo. Hemoccult NEG in GI office yesterday. The plan per GI is to observe for any signs of ongoing bleeding, follow Hb on oral iron, no w/u for now. If w/u pursued, this will likely be barium enema to further eval for colonic neoplasm (per Dr. Cristina Gong).  An After Visit Summary was printed and given to the patient.  FOLLOW UP: Return for 10-14d f/u R  leg redness.  Signed:  Crissie Sickles, MD           10/10/2019

## 2019-10-17 ENCOUNTER — Telehealth: Payer: Self-pay

## 2019-10-17 NOTE — Telephone Encounter (Signed)
Patient given 10 day course of Augmentin on 5/3, advised by PCP during this time not to take cipro. Pt's son calling to make sure patient okay to restart cipro since other medication has been finished, advised this should be okay since taken for daily UTI prevention.

## 2019-10-17 NOTE — Telephone Encounter (Signed)
Regarding Cipro and Amoxicillin, when to stop taking it, he has questions, should be in her chart   [phone] please call her son

## 2019-11-29 ENCOUNTER — Encounter: Payer: Self-pay | Admitting: Family Medicine

## 2019-12-15 ENCOUNTER — Encounter: Payer: Self-pay | Admitting: Family Medicine

## 2019-12-15 ENCOUNTER — Ambulatory Visit (INDEPENDENT_AMBULATORY_CARE_PROVIDER_SITE_OTHER): Payer: Medicare HMO | Admitting: Family Medicine

## 2019-12-15 ENCOUNTER — Other Ambulatory Visit: Payer: Self-pay

## 2019-12-15 VITALS — BP 118/63 | HR 63 | Temp 98.2°F | Resp 16 | Ht 64.0 in | Wt 127.8 lb

## 2019-12-15 DIAGNOSIS — N3946 Mixed incontinence: Secondary | ICD-10-CM | POA: Diagnosis not present

## 2019-12-15 DIAGNOSIS — I10 Essential (primary) hypertension: Secondary | ICD-10-CM

## 2019-12-15 DIAGNOSIS — D500 Iron deficiency anemia secondary to blood loss (chronic): Secondary | ICD-10-CM

## 2019-12-15 DIAGNOSIS — K5909 Other constipation: Secondary | ICD-10-CM

## 2019-12-15 DIAGNOSIS — R350 Frequency of micturition: Secondary | ICD-10-CM | POA: Diagnosis not present

## 2019-12-15 LAB — POC URINALSYSI DIPSTICK (AUTOMATED)
Bilirubin, UA: NEGATIVE
Blood, UA: NEGATIVE
Glucose, UA: NEGATIVE
Ketones, UA: NEGATIVE
Leukocytes, UA: NEGATIVE
Nitrite, UA: NEGATIVE
Protein, UA: POSITIVE — AB
Spec Grav, UA: 1.025 (ref 1.010–1.025)
Urobilinogen, UA: 0.2 E.U./dL
pH, UA: 6 (ref 5.0–8.0)

## 2019-12-15 NOTE — Patient Instructions (Addendum)
Drink at least 4 bottles of water per day, preferably FIVE bottles per day.  You may take miralax 1 capful 1 or 2 times every day to help with constipation.

## 2019-12-15 NOTE — Progress Notes (Signed)
OFFICE VISIT  12/15/2019   CC:  Chief Complaint  Patient presents with  . Frequent urination   HPI:    Patient is a 86 y.o. Caucasian female who presents for frequent urination. Chronic urinary urgency/frequency.  She doesn't really know if worse than usual lately. Has nocturia x 2 minimum per night.   Drinks only approx 15-20 oz water per day.  One cup coffee qAM.  No soda.  Constipation lately as well, better with taking a dose of miralax, has 1 fair stool per day. Tries to eat high fiber diet.   Stool is very dark still. Has been taking iron tab once daily x the last 3 mo. Hx of IDA, hemoccult + 09/2019 (hemoccult neg in GI office).  ROS: no fevers, no CP, no SOB, no wheezing, no cough, no dizziness, no HAs, no rashes, no hematochezia.  No polyuria or polydipsia.  No myalgias or arthralgias.  No focal weakness, paresthesias, or tremors.  No acute vision or hearing abnormalities. No n/v/d or abd pain.  No palpitations.     Past Medical History:  Diagnosis Date  . Abnormal finding on thyroid function test    per old records: T3 low, free T4 elevated, TSH normal.  [place]our thyroid made her feel worse and T4 went up so med d/c'd and liothyronine 5 mcg tried but also made pt feel worse.  Endo (Dr. Loanne Drilling felt like Biotin was causing the abnormal TFT's).  Repeat TFTs OFF BIOTIN x 2 wks were NORMAL.  NO FURTHER THYROID TESTING NEEDED.  Marland Kitchen Arthritis    "think it's osteo; got some in my hands"  . Bifascicular block   . CAD (coronary artery disease)    Cath 2009  Occluded LAD, 40-50% RCA and circ managed medically.  Repeat 06/22/15 no change.  . Chronic constipation    slow transit.  Barium enema to eval for colonic stricture 10/2014 was NORMAL  . Cognitive dysfunction    short term memory loss--takes ginko biloba  . COPD (chronic obstructive pulmonary disease) ([place])    Changes noted on CXR 2017  . GERD (gastroesophageal reflux disease)    (also LPR) Schatzki's ring, s/p dil '97; food  impaction 08/2010, on PPI therapy since w/out further sx so no dil performed  . History of herpes zoster 06/2015   right side of chest; presented with chest pain mimicking [place]--cardiac eval showed stable CAD compared to 2009.  Marland Kitchen Hyperkalemia 06/2017   suspected due to increase of losartan from 25mg  bid to 50mg  bid.  Dose lowered back to 25 mg bid K normalized.  After increase again to 50 qAM and 25 qPM potassium 4.8 so I did not increase dose any further (07/20/17).  . Hyperlipidemia   . Hypertension   . Hyponatremia 10/03/2011   Na 131 on labs 02/2015 at [facility].  Baseline Na 132.  . Iron deficiency anemia 11/30/15; 09/2019   Hemoccults neg x 3 12/2015.  ?Mild malabsorption?  09/2019 Hb drop to 8.5, iron low, hemoccult +.  Oral iron started->Dr. Buccini eval->heme neg in his office->obs/follow Hb on oral Fe; order BE if Hb not responding or having ongoing heme+ stool.  . Mixed stress and urge urinary incontinence   . RBBB with left anterior fascicular block    bifascicular block  . Recurrent UTI    Bactrim prophyl, then changed to cefdinir (?med rxn?).  Cipro qd as of 08/2018.  Marland Kitchen Third nerve palsy 05/2013   presented as diplopia; felt by neuro to be  microvascular insult so no carotid dopplers needed (CT and MRI neg for CVA)  . Thrombocytopenia ([place]) 02/2013   Plts 114K  . Uterine cancer ([place]) 2000  . Xerostomia 2017/2018   Age related glandular atrophy + med effect (oxybutynin and gabapentin).  ANA and sjogren's panel NEG 06/2017.    Past Surgical History:  Procedure Laterality Date  . ABDOMINAL HYSTERECTOMY  2000  . APPENDECTOMY  2000  . CARDIAC CATHETERIZATION N/A 06/22/2015   Stable single vessel CAD, EF normal.  Procedure: Left Heart Cath and Coronary Angiography;  Surgeon: Leonie Man, MD;  Location: [place] CV LAB;  Service: Cardiovascular;  Laterality: N/A;  . CATARACT EXTRACTION W/ INTRAOCULAR LENS  IMPLANT, BILATERAL  1990's  . [place]; 1960  . COLONOSCOPY   04/2001   BE neg 10/2014  . DILATION AND CURETTAGE OF UTERUS    . ESOPHAGOGASTRODUODENOSCOPY  1997   for dysphagia->esoph dilatation done  . HERNIA REPAIR     abdominal "twice"  . LEFT HEART CATHETERIZATION WITH CORONARY ANGIOGRAM N/A 10/04/2011   Procedure: LEFT HEART CATHETERIZATION WITH CORONARY ANGIOGRAM;  Surgeon: Jacolyn Reedy, MD;  Location: [facility] CATH LAB;  Service: Cardiovascular;  Laterality: N/A;  . REPAIR KNEE LIGAMENT  ~ 2008   left  . TONSILLECTOMY     as a child  . TRANSTHORACIC ECHOCARDIOGRAM  05/2013   EF 60-65%, trivial aortic regurg, no wall motion abnorm    Outpatient Medications Prior to Visit  Medication Sig Dispense Refill  . acetaminophen (TYLENOL) 325 MG tablet Take 325 mg by mouth every 6 (six) hours as needed.    . cevimeline (EVOXAC) 30 MG capsule TAKE 1 CAPSULE 3 TIMES A DAY FOR TREATMENT OF CHRONIC DRY MOUTH 270 capsule 3  . Cholecalciferol (VITAMIN D3) 1000 UNITS CAPS Take 1,000 Units by mouth daily.     . ciprofloxacin (CIPRO) 250 MG tablet TAKE 1 TABLET BY MOUTH EVERY DAY FOR BLADDER INFECTION PROPHYLAXIS. 90 tablet 1  . FERROUS SULFATE PO Take by mouth daily.    Marland Kitchen losartan (COZAAR) 50 MG tablet TAKE 1/2 TABLET (25 MG TOTAL) BY MOUTH 2 (TWO) TIMES DAILY. 90 tablet 0  . Omega-3 Fatty Acids (FISH OIL) 1000 MG CAPS Take 1 capsule by mouth daily.    Marland Kitchen oxybutynin (DITROPAN) 5 MG tablet 1 tab po every other day 45 tablet 3  . pantoprazole (PROTONIX) 40 MG tablet Take 1 tablet (40 mg total) by mouth daily. 30 tablet 1  . polyethylene glycol powder (GLYCOLAX/MIRALAX) powder TAKE 17 G BY MOUTH 2 (TWO) TIMES DAILY. 527 g 5  . fluticasone (FLONASE) 50 MCG/ACT nasal spray SPRAY 2 SPRAYS INTO EACH NOSTRIL EVERY DAY (Patient not taking: Reported on 12/15/2019) 48 mL 0  . nitroGLYCERIN (NITROSTAT) 0.4 MG SL tablet Place 1 tablet (0.4 mg total) under the tongue every 5 (five) minutes as needed for chest pain. (Patient not taking: Reported on 12/15/2019) 25 tablet 12  .  amoxicillin-clavulanate (AUGMENTIN) 875-125 MG tablet Take 1 tablet by mouth 2 (two) times daily. (Patient not taking: Reported on 12/15/2019) 20 tablet 0   No facility-administered medications prior to visit.    Allergies  Allergen Reactions  . Cefdinir Hives  . Macrobid [Nitrofurantoin Macrocrystal] Other (See Comments)    "I had chills & fever"    ROS As per HPI  PE: Blood pressure 118/63, pulse 63, temperature 98.2 F (36.8 C), temperature source Oral, resp. rate 16, height 5\' 4"  (1.626 m), weight 127 lb 12.8  oz (58 kg), SpO2 94 %. Gen: Alert, well appearing.  Patient is oriented to person, place, time, and situation. AFFECT: pleasant, lucid thought and speech. CV: RRR with occ ectopy, soft systolic murmur, no diastolic murmur.  No r/g.   LUNGS: CTA bilat, nonlabored resps, good aeration in all lung fields. EXT: no clubbing or cyanosis.  no edema.     LABS:  Lab Results  Component Value Date   TSH 1.55 11/29/2015   Lab Results  Component Value Date   WBC 4.6 10/06/2019   HGB 10.4 (L) 10/06/2019   HCT 33.6 (L) 10/06/2019   MCV 84.0 10/06/2019   PLT 184 10/06/2019   Lab Results  Component Value Date   IRON 20 (L) 09/08/2019   TIBC 313 09/08/2019   FERRITIN 10 (L) 09/08/2019    Lab Results  Component Value Date   CREATININE 0.85 04/19/2018   BUN 18 04/19/2018   NA 132 (L) 04/19/2018   K 3.7 04/19/2018   CL 96 (L) 04/19/2018   CO2 26 04/19/2018   Lab Results  Component Value Date   ALT 12 06/29/2017   AST 17 06/29/2017   ALKPHOS 54 07/11/2016   BILITOT 0.4 06/29/2017   Lab Results  Component Value Date   CHOL 171 06/21/2015   Lab Results  Component Value Date   HDL 71 06/21/2015   Lab Results  Component Value Date   LDLCALC 95 06/21/2015   Lab Results  Component Value Date   TRIG 26 06/21/2015   Lab Results  Component Value Date   CHOLHDL 2.4 06/21/2015   Lab Results  Component Value Date   HGBA1C 5.8 (H) 05/30/2013   POC dipstick  UA today was NORMAL.  IMPRESSION AND PLAN:  1) Mixed urge>stress urinary incontinence: stable. Unfortunately, she is not drinking enough fluids as a result of this. We've repeatedly tried increasing her oxybutynin but then her dry mouth becomes intolerable. We'll leave things as they are.  2) Hx of recurrent UTI: taking cipro 250mg  qd as prophylaxis. She does not have a UTI today.  No changes.  3) Constipation: seems to be controlled with daily miralax. Iron tab daily may have made this more of an issue for her the last few months. She may take 1 capful 1-2 times per day. Increase fiber in diet if able.  4) IDA, question of chronic GI blood loss (hemoccult pos here, neg at GI MD office). Hb responded well to iron at the time of lab check 2 months ago. Recheck CBC and iron panel today.  5) HTN: stable. Hx of hyperkalemia if losartan dose pushed too high. Continue losartan 1/2 of 50mg  tab bid. Check lytes/cr today.  An After Visit Summary was printed and given to the patient.  FOLLOW UP: Return in about 3 months (around 03/16/2020) for routine chronic illness f/u.  Signed:  Crissie Sickles, MD           12/15/2019

## 2019-12-16 LAB — CBC
HCT: 34.3 % — ABNORMAL LOW (ref 36.0–46.0)
Hemoglobin: 11.4 g/dL — ABNORMAL LOW (ref 12.0–15.0)
MCHC: 33.3 g/dL (ref 30.0–36.0)
MCV: 85.5 fl (ref 78.0–100.0)
Platelets: 120 10*3/uL — ABNORMAL LOW (ref 150.0–400.0)
RBC: 4.01 Mil/uL (ref 3.87–5.11)
RDW: 16.1 % — ABNORMAL HIGH (ref 11.5–15.5)
WBC: 4.7 10*3/uL (ref 4.0–10.5)

## 2019-12-16 LAB — BASIC METABOLIC PANEL
BUN: 32 mg/dL — ABNORMAL HIGH (ref 6–23)
CO2: 30 mEq/L (ref 19–32)
Calcium: 8.6 mg/dL (ref 8.4–10.5)
Chloride: 99 mEq/L (ref 96–112)
Creatinine, Ser: 0.79 mg/dL (ref 0.40–1.20)
GFR: 68.33 mL/min (ref >60.00)
Glucose, Bld: 90 mg/dL (ref 70–99)
Potassium: 4.9 mEq/L (ref 3.5–5.1)
Sodium: 133 mEq/L — ABNORMAL LOW (ref 135–145)

## 2019-12-16 LAB — IRON,TIBC AND FERRITIN PANEL
%SAT: 24 % (calc) (ref 16–45)
Ferritin: 33 ng/mL (ref 16–288)
Iron: 64 ug/dL (ref 45–160)
TIBC: 266 mcg/dL (calc) (ref 250–450)

## 2020-01-29 ENCOUNTER — Telehealth: Payer: Self-pay

## 2020-01-29 ENCOUNTER — Other Ambulatory Visit: Payer: Self-pay | Admitting: Family Medicine

## 2020-01-29 NOTE — Telephone Encounter (Signed)
Ok'd by PCP for in office appt tomorrow.

## 2020-01-29 NOTE — Telephone Encounter (Signed)
Son Merry Proud [facility]) states that his mom thinks she has something stuck in her ear, a piece of her hearing aide may have fallen in there.  He wants to bring her in to have a nurse look at it .  He was going to "drop in" without an appt but decided to call first. They are "out in about" right now, doing some errands... so they may still "drop in".  Please call Dellis Filbert ([place]) [phone]

## 2020-01-29 NOTE — Telephone Encounter (Signed)
Spoke with patient's son, Dellis Filbert and advised I would relay the information to PCP and reach back out.

## 2020-01-30 ENCOUNTER — Ambulatory Visit (INDEPENDENT_AMBULATORY_CARE_PROVIDER_SITE_OTHER): Payer: Medicare HMO | Admitting: Family Medicine

## 2020-01-30 ENCOUNTER — Other Ambulatory Visit: Payer: Self-pay

## 2020-01-30 ENCOUNTER — Encounter: Payer: Self-pay | Admitting: Family Medicine

## 2020-01-30 VITALS — BP 148/81 | HR 66 | Temp 97.9°F | Resp 16 | Ht 64.0 in | Wt 127.4 lb

## 2020-01-30 DIAGNOSIS — H6123 Impacted cerumen, bilateral: Secondary | ICD-10-CM | POA: Diagnosis not present

## 2020-01-30 NOTE — Progress Notes (Signed)
OFFICE VISIT  01/30/2020   CC:  Chief Complaint  Patient presents with  . Hearing loss    lost part of her left hearing aide, unable to hear for 6-7 days    HPI:    Patient is a 86 y.o. Caucasian female who presents to have me look in left ear. Her son says he sees something in it and thinks it may be part of her hearing aid (which she says she lost about a week ago).  Past Medical History:  Diagnosis Date  . Abnormal finding on thyroid function test    per old records: T3 low, free T4 elevated, TSH normal.  Armour thyroid made her feel worse and T4 went up so med d/c'd and liothyronine 5 mcg tried but also made pt feel worse.  Endo (Dr. Loanne Drilling felt like Biotin was causing the abnormal TFT's).  Repeat TFTs OFF BIOTIN x 2 wks were NORMAL.  NO FURTHER THYROID TESTING NEEDED.  Marland Kitchen Arthritis    "think it's osteo; got some in my hands"  . Bifascicular block   . CAD (coronary artery disease)    Cath 2009  Occluded LAD, 40-50% RCA and circ managed medically.  Repeat 06/22/15 no change.  . Chronic constipation    slow transit.  Barium enema to eval for colonic stricture 10/2014 was NORMAL  . Cognitive dysfunction    short term memory loss--takes ginko biloba  . COPD (chronic obstructive pulmonary disease) ([place])    Changes noted on CXR 2017  . GERD (gastroesophageal reflux disease)    (also LPR) Schatzki's ring, s/p dil '97; food impaction 08/2010, on PPI therapy since w/out further sx so no dil performed  . History of herpes zoster 06/2015   right side of chest; presented with chest pain mimicking USA--cardiac eval showed stable CAD compared to 2009.  Marland Kitchen Hyperkalemia 06/2017   suspected due to increase of losartan from 25mg  bid to 50mg  bid.  Dose lowered back to 25 mg bid K normalized.  After increase again to 50 qAM and 25 qPM potassium 4.8 so I did not increase dose any further (07/20/17).  . Hyperlipidemia   . Hypertension   . Hyponatremia 10/03/2011   Na 131 on labs 02/2015 at [facility].   Baseline Na 132.  . Iron deficiency anemia 11/30/15; 09/2019   Hemoccults neg x 3 12/2015.  ?Mild malabsorption?  09/2019 Hb drop to 8.5, iron low, hemoccult +.  Oral iron started->Dr. Buccini eval->heme neg in his office->obs/follow Hb on oral Fe; order BE if Hb not responding or having ongoing heme+ stool.  . Mixed stress and urge urinary incontinence   . RBBB with left anterior fascicular block    bifascicular block  . Recurrent UTI    Bactrim prophyl, then changed to cefdinir (?med rxn?).  Cipro qd as of 08/2018.  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 Thrombocytopenia ([place]) 02/2013   Plts 114K  . Uterine cancer ([place]) 2000  . Xerostomia 2017/2018   Age related glandular atrophy + med effect (oxybutynin and gabapentin).  ANA and sjogren's panel NEG 06/2017.    Past Surgical History:  Procedure Laterality Date  . ABDOMINAL HYSTERECTOMY  2000  . APPENDECTOMY  2000  . CARDIAC CATHETERIZATION N/A 06/22/2015   Stable single vessel CAD, EF normal.  Procedure: Left Heart Cath and Coronary Angiography;  Surgeon: Leonie Man,  MD;  Location: [facility] LAB;  Service: Cardiovascular;  Laterality: N/A;  . CATARACT EXTRACTION W/ INTRAOCULAR LENS  IMPLANT, BILATERAL  1990's  . [place]; 1960  . COLONOSCOPY  04/2001   BE neg 10/2014  . DILATION AND CURETTAGE OF UTERUS    . ESOPHAGOGASTRODUODENOSCOPY  1997   for dysphagia->esoph dilatation done  . HERNIA REPAIR     abdominal "twice"  . LEFT HEART CATHETERIZATION WITH CORONARY ANGIOGRAM N/A 10/04/2011   Procedure: LEFT HEART CATHETERIZATION WITH CORONARY ANGIOGRAM;  Surgeon: Jacolyn Reedy, MD;  Location: [facility] CATH LAB;  Service: Cardiovascular;  Laterality: N/A;  . REPAIR KNEE LIGAMENT  ~ 2008   left  . TONSILLECTOMY     as a child  . TRANSTHORACIC ECHOCARDIOGRAM  05/2013   EF 60-65%, trivial aortic regurg, no wall  motion abnorm    Outpatient Medications Prior to Visit  Medication Sig Dispense Refill  . acetaminophen (TYLENOL) 325 MG tablet Take 325 mg by mouth every 6 (six) hours as needed.    . cevimeline (EVOXAC) 30 MG capsule TAKE 1 CAPSULE 3 TIMES A DAY FOR TREATMENT OF CHRONIC DRY MOUTH 270 capsule 3  . Cholecalciferol (VITAMIN D3) 1000 UNITS CAPS Take 1,000 Units by mouth daily.     . ciprofloxacin (CIPRO) 250 MG tablet TAKE 1 TABLET BY MOUTH EVERY DAY FOR BLADDER INFECTION PROPHYLAXIS. 90 tablet 1  . FERROUS SULFATE PO Take by mouth daily.    . fluticasone (FLONASE) 50 MCG/ACT nasal spray SPRAY 2 SPRAYS INTO EACH NOSTRIL EVERY DAY 48 mL 0  . losartan (COZAAR) 50 MG tablet TAKE 1/2 TABLET (25 MG TOTAL) BY MOUTH 2 (TWO) TIMES DAILY. 90 tablet 0  . Omega-3 Fatty Acids (FISH OIL) 1000 MG CAPS Take 1 capsule by mouth daily.    Marland Kitchen oxybutynin (DITROPAN) 5 MG tablet 1 tab po every other day 45 tablet 3  . pantoprazole (PROTONIX) 40 MG tablet Take 1 tablet (40 mg total) by mouth daily. 30 tablet 1  . polyethylene glycol powder (GLYCOLAX/MIRALAX) powder TAKE 17 G BY MOUTH 2 (TWO) TIMES DAILY. 527 g 5  . nitroGLYCERIN (NITROSTAT) 0.4 MG SL tablet Place 1 tablet (0.4 mg total) under the tongue every 5 (five) minutes as needed for chest pain. (Patient not taking: Reported on 12/15/2019) 25 tablet 12   No facility-administered medications prior to visit.    Allergies  Allergen Reactions  . Cefdinir Hives  . Macrobid [Nitrofurantoin Macrocrystal] Other (See Comments)    "I had chills & fever"    ROS As per HPI  PE: Blood pressure (!) 148/81, pulse 66, temperature 97.9 F (36.6 C), temperature source Oral, resp. rate 16, height 5\' 4"  (1.626 m), weight 127 lb 6.4 oz (57.8 kg), SpO2 97 %. Gen: Alert, well appearing.  Patient is oriented to person, place, time, and situation. AFFECT: pleasant, lucid thought and speech. L EAC with 50% occlusion by cerumen--extracted with curette today and now EAC normal  and TM normal.  No FB. R EAC with 75% cerumen impaction --irrigated and extracted with curette, TM normal.  No FB.  LABS:    Chemistry      Component Value Date/Time   NA 133 (L) 12/15/2019 1552   K 4.9 12/15/2019 1552   CL 99 12/15/2019 1552   CO2 30 12/15/2019 1552   BUN 32 (H) 12/15/2019 1552   CREATININE 0.79 12/15/2019 1552   CREATININE 0.83 06/29/2017 1425      Component Value Date/Time  CALCIUM 8.6 12/15/2019 1552   ALKPHOS 54 07/11/2016 1900   AST 17 06/29/2017 1425   ALT 12 06/29/2017 1425   BILITOT 0.4 06/29/2017 1425     IMPRESSION AND PLAN:  Cerumen impaction bilat. Consent obtained, procedure explained to pt. L EAC cleared with curette and now normal. R side irrigated and cleared with curette and now normal. Pt tolerated procedure well.  No immediate complications.  An After Visit Summary was printed and given to the patient.  FOLLOW UP: Return if symptoms worsen or fail to improve.  Signed:  Crissie Sickles, MD           01/30/2020

## 2020-02-10 DIAGNOSIS — R29810 Facial weakness: Secondary | ICD-10-CM | POA: Diagnosis not present

## 2020-02-10 DIAGNOSIS — R55 Syncope and collapse: Secondary | ICD-10-CM | POA: Diagnosis not present

## 2020-02-10 DIAGNOSIS — R791 Abnormal coagulation profile: Secondary | ICD-10-CM | POA: Diagnosis not present

## 2020-02-10 DIAGNOSIS — J984 Other disorders of lung: Secondary | ICD-10-CM | POA: Diagnosis not present

## 2020-02-10 DIAGNOSIS — K219 Gastro-esophageal reflux disease without esophagitis: Secondary | ICD-10-CM | POA: Diagnosis not present

## 2020-02-10 DIAGNOSIS — E876 Hypokalemia: Secondary | ICD-10-CM | POA: Diagnosis not present

## 2020-02-10 DIAGNOSIS — R531 Weakness: Secondary | ICD-10-CM | POA: Diagnosis not present

## 2020-02-10 DIAGNOSIS — J449 Chronic obstructive pulmonary disease, unspecified: Secondary | ICD-10-CM | POA: Diagnosis not present

## 2020-02-10 DIAGNOSIS — I10 Essential (primary) hypertension: Secondary | ICD-10-CM | POA: Diagnosis not present

## 2020-02-10 DIAGNOSIS — R0989 Other specified symptoms and signs involving the circulatory and respiratory systems: Secondary | ICD-10-CM | POA: Diagnosis not present

## 2020-02-10 DIAGNOSIS — G3184 Mild cognitive impairment, so stated: Secondary | ICD-10-CM | POA: Diagnosis not present

## 2020-02-10 DIAGNOSIS — I4891 Unspecified atrial fibrillation: Secondary | ICD-10-CM

## 2020-02-10 DIAGNOSIS — I480 Paroxysmal atrial fibrillation: Secondary | ICD-10-CM | POA: Diagnosis not present

## 2020-02-10 DIAGNOSIS — E785 Hyperlipidemia, unspecified: Secondary | ICD-10-CM | POA: Diagnosis not present

## 2020-02-10 DIAGNOSIS — I119 Hypertensive heart disease without heart failure: Secondary | ICD-10-CM | POA: Diagnosis not present

## 2020-02-10 DIAGNOSIS — R000 Tachycardia, unspecified: Secondary | ICD-10-CM | POA: Diagnosis not present

## 2020-02-10 DIAGNOSIS — I2510 Atherosclerotic heart disease of native coronary artery without angina pectoris: Secondary | ICD-10-CM | POA: Diagnosis not present

## 2020-02-10 DIAGNOSIS — I517 Cardiomegaly: Secondary | ICD-10-CM | POA: Diagnosis not present

## 2020-02-10 DIAGNOSIS — J90 Pleural effusion, not elsewhere classified: Secondary | ICD-10-CM | POA: Diagnosis not present

## 2020-02-10 HISTORY — DX: Unspecified atrial fibrillation: I48.91

## 2020-02-11 DIAGNOSIS — J438 Other emphysema: Secondary | ICD-10-CM | POA: Diagnosis not present

## 2020-02-11 DIAGNOSIS — R0989 Other specified symptoms and signs involving the circulatory and respiratory systems: Secondary | ICD-10-CM | POA: Diagnosis not present

## 2020-02-11 DIAGNOSIS — R531 Weakness: Secondary | ICD-10-CM | POA: Diagnosis not present

## 2020-02-11 DIAGNOSIS — I2510 Atherosclerotic heart disease of native coronary artery without angina pectoris: Secondary | ICD-10-CM | POA: Diagnosis not present

## 2020-02-11 DIAGNOSIS — E876 Hypokalemia: Secondary | ICD-10-CM | POA: Diagnosis not present

## 2020-02-11 DIAGNOSIS — I10 Essential (primary) hypertension: Secondary | ICD-10-CM | POA: Diagnosis not present

## 2020-02-11 DIAGNOSIS — I083 Combined rheumatic disorders of mitral, aortic and tricuspid valves: Secondary | ICD-10-CM | POA: Diagnosis not present

## 2020-02-11 DIAGNOSIS — I480 Paroxysmal atrial fibrillation: Secondary | ICD-10-CM | POA: Diagnosis not present

## 2020-02-11 DIAGNOSIS — K219 Gastro-esophageal reflux disease without esophagitis: Secondary | ICD-10-CM | POA: Diagnosis not present

## 2020-02-11 DIAGNOSIS — G3184 Mild cognitive impairment, so stated: Secondary | ICD-10-CM | POA: Insufficient documentation

## 2020-02-11 DIAGNOSIS — I517 Cardiomegaly: Secondary | ICD-10-CM | POA: Diagnosis not present

## 2020-02-11 DIAGNOSIS — R55 Syncope and collapse: Secondary | ICD-10-CM | POA: Diagnosis not present

## 2020-02-11 DIAGNOSIS — J984 Other disorders of lung: Secondary | ICD-10-CM | POA: Diagnosis not present

## 2020-02-11 DIAGNOSIS — J90 Pleural effusion, not elsewhere classified: Secondary | ICD-10-CM | POA: Diagnosis not present

## 2020-02-13 DIAGNOSIS — I480 Paroxysmal atrial fibrillation: Secondary | ICD-10-CM | POA: Diagnosis not present

## 2020-02-18 ENCOUNTER — Telehealth: Payer: Self-pay

## 2020-02-18 NOTE — Telephone Encounter (Signed)
FYI  Please see below

## 2020-02-18 NOTE — Telephone Encounter (Signed)
Noted  

## 2020-02-18 NOTE — Telephone Encounter (Signed)
Patient son Sabrina Alvarez [facility]) called to say that patient was found by EMS outside in the garden, under a fence, face down, and suspected to be outside for about 3 hours. Sabrina Alvarez states that patient is okay. Since then she has seen a cardiologist he took her off Losartan and put her on Atenolol and baby aspirin.  Please call Sabrina Alvarez [phone].

## 2020-03-05 DIAGNOSIS — I639 Cerebral infarction, unspecified: Secondary | ICD-10-CM

## 2020-03-05 HISTORY — DX: Cerebral infarction, unspecified: I63.9

## 2020-03-10 DIAGNOSIS — I480 Paroxysmal atrial fibrillation: Secondary | ICD-10-CM | POA: Diagnosis not present

## 2020-03-17 ENCOUNTER — Telehealth: Payer: Self-pay | Admitting: Family Medicine

## 2020-03-17 DIAGNOSIS — I480 Paroxysmal atrial fibrillation: Secondary | ICD-10-CM | POA: Diagnosis not present

## 2020-03-17 NOTE — Telephone Encounter (Signed)
Paper on providers desk.

## 2020-03-17 NOTE — Telephone Encounter (Signed)
Son dropping off a summary of recent events with his mom. Memory is declining and he wants you to know about some recent concerns. She is still living alone but btwn her two sons they are spending a lot of time with her. Says you can keep/shred notes he dropped off after review, he has copies. Notes placed in bin for pick up.

## 2020-03-18 ENCOUNTER — Encounter: Payer: Self-pay | Admitting: Family Medicine

## 2020-03-23 ENCOUNTER — Encounter (HOSPITAL_COMMUNITY): Payer: Self-pay

## 2020-03-23 ENCOUNTER — Emergency Department (HOSPITAL_COMMUNITY): Payer: Medicare HMO

## 2020-03-23 ENCOUNTER — Inpatient Hospital Stay (HOSPITAL_COMMUNITY): Payer: Medicare HMO

## 2020-03-23 ENCOUNTER — Inpatient Hospital Stay (HOSPITAL_COMMUNITY)
Admission: EM | Admit: 2020-03-23 | Discharge: 2020-03-27 | DRG: 065 | Disposition: A | Discharge status: Home Health | Payer: Medicare HMO | Attending: Internal Medicine | Admitting: Internal Medicine

## 2020-03-23 DIAGNOSIS — Z823 Family history of stroke: Secondary | ICD-10-CM

## 2020-03-23 DIAGNOSIS — I63411 Cerebral infarction due to embolism of right middle cerebral artery: Secondary | ICD-10-CM

## 2020-03-23 DIAGNOSIS — Z20822 Contact with and (suspected) exposure to covid-19: Secondary | ICD-10-CM | POA: Diagnosis not present

## 2020-03-23 DIAGNOSIS — E871 Hypo-osmolality and hyponatremia: Secondary | ICD-10-CM | POA: Diagnosis not present

## 2020-03-23 DIAGNOSIS — N390 Urinary tract infection, site not specified: Secondary | ICD-10-CM

## 2020-03-23 DIAGNOSIS — D500 Iron deficiency anemia secondary to blood loss (chronic): Secondary | ICD-10-CM | POA: Diagnosis not present

## 2020-03-23 DIAGNOSIS — M19041 Primary osteoarthritis, right hand: Secondary | ICD-10-CM | POA: Diagnosis present

## 2020-03-23 DIAGNOSIS — K219 Gastro-esophageal reflux disease without esophagitis: Secondary | ICD-10-CM | POA: Diagnosis present

## 2020-03-23 DIAGNOSIS — Z66 Do not resuscitate: Secondary | ICD-10-CM | POA: Diagnosis present

## 2020-03-23 DIAGNOSIS — I4892 Unspecified atrial flutter: Secondary | ICD-10-CM | POA: Diagnosis not present

## 2020-03-23 DIAGNOSIS — Z87440 Personal history of urinary (tract) infections: Secondary | ICD-10-CM

## 2020-03-23 DIAGNOSIS — R9082 White matter disease, unspecified: Secondary | ICD-10-CM | POA: Diagnosis not present

## 2020-03-23 DIAGNOSIS — R509 Fever, unspecified: Secondary | ICD-10-CM | POA: Diagnosis not present

## 2020-03-23 DIAGNOSIS — Z881 Allergy status to other antibiotic agents status: Secondary | ICD-10-CM

## 2020-03-23 DIAGNOSIS — I639 Cerebral infarction, unspecified: Secondary | ICD-10-CM | POA: Diagnosis not present

## 2020-03-23 DIAGNOSIS — N3946 Mixed incontinence: Secondary | ICD-10-CM | POA: Diagnosis present

## 2020-03-23 DIAGNOSIS — I10 Essential (primary) hypertension: Secondary | ICD-10-CM | POA: Diagnosis not present

## 2020-03-23 DIAGNOSIS — I672 Cerebral atherosclerosis: Secondary | ICD-10-CM | POA: Diagnosis not present

## 2020-03-23 DIAGNOSIS — Z8542 Personal history of malignant neoplasm of other parts of uterus: Secondary | ICD-10-CM

## 2020-03-23 DIAGNOSIS — J449 Chronic obstructive pulmonary disease, unspecified: Secondary | ICD-10-CM | POA: Diagnosis present

## 2020-03-23 DIAGNOSIS — I6523 Occlusion and stenosis of bilateral carotid arteries: Secondary | ICD-10-CM | POA: Diagnosis not present

## 2020-03-23 DIAGNOSIS — M19042 Primary osteoarthritis, left hand: Secondary | ICD-10-CM | POA: Diagnosis present

## 2020-03-23 DIAGNOSIS — J439 Emphysema, unspecified: Secondary | ICD-10-CM | POA: Diagnosis not present

## 2020-03-23 DIAGNOSIS — R29707 NIHSS score 7: Secondary | ICD-10-CM | POA: Diagnosis present

## 2020-03-23 DIAGNOSIS — Z23 Encounter for immunization: Secondary | ICD-10-CM | POA: Diagnosis not present

## 2020-03-23 DIAGNOSIS — I517 Cardiomegaly: Secondary | ICD-10-CM | POA: Diagnosis not present

## 2020-03-23 DIAGNOSIS — I6389 Other cerebral infarction: Secondary | ICD-10-CM | POA: Diagnosis not present

## 2020-03-23 DIAGNOSIS — G8194 Hemiplegia, unspecified affecting left nondominant side: Secondary | ICD-10-CM | POA: Diagnosis not present

## 2020-03-23 DIAGNOSIS — I452 Bifascicular block: Secondary | ICD-10-CM | POA: Diagnosis not present

## 2020-03-23 DIAGNOSIS — I480 Paroxysmal atrial fibrillation: Secondary | ICD-10-CM | POA: Diagnosis not present

## 2020-03-23 DIAGNOSIS — R195 Other fecal abnormalities: Secondary | ICD-10-CM | POA: Diagnosis present

## 2020-03-23 DIAGNOSIS — I361 Nonrheumatic tricuspid (valve) insufficiency: Secondary | ICD-10-CM | POA: Diagnosis not present

## 2020-03-23 DIAGNOSIS — I951 Orthostatic hypotension: Secondary | ICD-10-CM | POA: Diagnosis not present

## 2020-03-23 DIAGNOSIS — I2510 Atherosclerotic heart disease of native coronary artery without angina pectoris: Secondary | ICD-10-CM | POA: Diagnosis not present

## 2020-03-23 DIAGNOSIS — Z79899 Other long term (current) drug therapy: Secondary | ICD-10-CM

## 2020-03-23 DIAGNOSIS — J90 Pleural effusion, not elsewhere classified: Secondary | ICD-10-CM | POA: Diagnosis not present

## 2020-03-23 DIAGNOSIS — I739 Peripheral vascular disease, unspecified: Secondary | ICD-10-CM | POA: Diagnosis not present

## 2020-03-23 DIAGNOSIS — I63413 Cerebral infarction due to embolism of bilateral middle cerebral arteries: Secondary | ICD-10-CM | POA: Diagnosis not present

## 2020-03-23 DIAGNOSIS — R29818 Other symptoms and signs involving the nervous system: Secondary | ICD-10-CM | POA: Diagnosis not present

## 2020-03-23 DIAGNOSIS — R410 Disorientation, unspecified: Secondary | ICD-10-CM | POA: Diagnosis not present

## 2020-03-23 DIAGNOSIS — R29810 Facial weakness: Secondary | ICD-10-CM | POA: Diagnosis present

## 2020-03-23 DIAGNOSIS — Z7901 Long term (current) use of anticoagulants: Secondary | ICD-10-CM

## 2020-03-23 DIAGNOSIS — I080 Rheumatic disorders of both mitral and aortic valves: Secondary | ICD-10-CM | POA: Diagnosis present

## 2020-03-23 DIAGNOSIS — D638 Anemia in other chronic diseases classified elsewhere: Secondary | ICD-10-CM | POA: Diagnosis present

## 2020-03-23 DIAGNOSIS — Z833 Family history of diabetes mellitus: Secondary | ICD-10-CM

## 2020-03-23 DIAGNOSIS — R000 Tachycardia, unspecified: Secondary | ICD-10-CM | POA: Diagnosis not present

## 2020-03-23 DIAGNOSIS — H4902 Third [oculomotor] nerve palsy, left eye: Secondary | ICD-10-CM | POA: Diagnosis present

## 2020-03-23 DIAGNOSIS — R531 Weakness: Secondary | ICD-10-CM | POA: Diagnosis not present

## 2020-03-23 DIAGNOSIS — I6503 Occlusion and stenosis of bilateral vertebral arteries: Secondary | ICD-10-CM | POA: Diagnosis not present

## 2020-03-23 DIAGNOSIS — E785 Hyperlipidemia, unspecified: Secondary | ICD-10-CM | POA: Diagnosis not present

## 2020-03-23 DIAGNOSIS — I351 Nonrheumatic aortic (valve) insufficiency: Secondary | ICD-10-CM | POA: Diagnosis not present

## 2020-03-23 DIAGNOSIS — Z7982 Long term (current) use of aspirin: Secondary | ICD-10-CM

## 2020-03-23 DIAGNOSIS — Z8673 Personal history of transient ischemic attack (TIA), and cerebral infarction without residual deficits: Secondary | ICD-10-CM

## 2020-03-23 DIAGNOSIS — D509 Iron deficiency anemia, unspecified: Secondary | ICD-10-CM | POA: Diagnosis present

## 2020-03-23 DIAGNOSIS — Z8261 Family history of arthritis: Secondary | ICD-10-CM

## 2020-03-23 LAB — COMPREHENSIVE METABOLIC PANEL
ALT: 13 U/L (ref 0–44)
AST: 21 U/L (ref 15–41)
Albumin: 3.4 g/dL — ABNORMAL LOW (ref 3.5–5.0)
Alkaline Phosphatase: 47 U/L (ref 38–126)
Anion gap: 8 (ref 5–15)
BUN: 23 mg/dL (ref 8–23)
CO2: 26 mmol/L (ref 22–32)
Calcium: 8.6 mg/dL — ABNORMAL LOW (ref 8.9–10.3)
Chloride: 98 mmol/L (ref 98–111)
Creatinine, Ser: 0.81 mg/dL (ref 0.44–1.00)
GFR, Estimated: >60 mL/min (ref >60)
Glucose, Bld: 102 mg/dL — ABNORMAL HIGH (ref 70–99)
Potassium: 3.9 mmol/L (ref 3.5–5.1)
Sodium: 132 mmol/L — ABNORMAL LOW (ref 135–145)
Total Bilirubin: 0.9 mg/dL (ref 0.3–1.2)
Total Protein: 6.4 g/dL — ABNORMAL LOW (ref 6.5–8.1)

## 2020-03-23 LAB — CBC
HCT: 36.6 % (ref 36.0–46.0)
Hemoglobin: 11.8 g/dL — ABNORMAL LOW (ref 12.0–15.0)
MCH: 28.2 pg (ref 26.0–34.0)
MCHC: 32.2 g/dL (ref 30.0–36.0)
MCV: 87.6 fL (ref 80.0–100.0)
Platelets: 158 10*3/uL (ref 150–400)
RBC: 4.18 MIL/uL (ref 3.87–5.11)
RDW: 14 % (ref 11.5–15.5)
WBC: 7.1 10*3/uL (ref 4.0–10.5)
nRBC: 0 % (ref 0.0–0.2)

## 2020-03-23 LAB — I-STAT CHEM 8, ED
BUN: 26 mg/dL — ABNORMAL HIGH (ref 8–23)
Calcium, Ion: 1.05 mmol/L — ABNORMAL LOW (ref 1.15–1.40)
Chloride: 97 mmol/L — ABNORMAL LOW (ref 98–111)
Creatinine, Ser: 0.7 mg/dL (ref 0.44–1.00)
Glucose, Bld: 101 mg/dL — ABNORMAL HIGH (ref 70–99)
HCT: 37 % (ref 36.0–46.0)
Hemoglobin: 12.6 g/dL (ref 12.0–15.0)
Potassium: 3.9 mmol/L (ref 3.5–5.1)
Sodium: 133 mmol/L — ABNORMAL LOW (ref 135–145)
TCO2: 24 mmol/L (ref 22–32)

## 2020-03-23 LAB — URINALYSIS, COMPLETE (UACMP) WITH MICROSCOPIC
Bilirubin Urine: NEGATIVE
Glucose, UA: NEGATIVE mg/dL
Hgb urine dipstick: NEGATIVE
Ketones, ur: 20 mg/dL — AB
Nitrite: POSITIVE — AB
Protein, ur: NEGATIVE mg/dL
Specific Gravity, Urine: 1.023 (ref 1.005–1.030)
pH: 6 (ref 5.0–8.0)

## 2020-03-23 LAB — RESPIRATORY PANEL BY RT PCR (FLU A&B, COVID)
Influenza A by PCR: NEGATIVE
Influenza B by PCR: NEGATIVE
SARS Coronavirus 2 by RT PCR: NEGATIVE

## 2020-03-23 LAB — DIFFERENTIAL
Abs Immature Granulocytes: 0.03 10*3/uL (ref 0.00–0.07)
Basophils Absolute: 0 10*3/uL (ref 0.0–0.1)
Basophils Relative: 0 %
Eosinophils Absolute: 0 10*3/uL (ref 0.0–0.5)
Eosinophils Relative: 0 %
Immature Granulocytes: 0 %
Lymphocytes Relative: 19 %
Lymphs Abs: 1.4 10*3/uL (ref 0.7–4.0)
Monocytes Absolute: 0.6 10*3/uL (ref 0.1–1.0)
Monocytes Relative: 9 %
Neutro Abs: 5 10*3/uL (ref 1.7–7.7)
Neutrophils Relative %: 72 %

## 2020-03-23 LAB — CK: Total CK: 70 U/L (ref 38–234)

## 2020-03-23 LAB — CBG MONITORING, ED: Glucose-Capillary: 93 mg/dL (ref 70–99)

## 2020-03-23 LAB — APTT: aPTT: 36 seconds (ref 24–36)

## 2020-03-23 LAB — PROTIME-INR
INR: 1.2 (ref 0.8–1.2)
Prothrombin Time: 14.3 seconds (ref 11.4–15.2)

## 2020-03-23 LAB — ETHANOL: Alcohol, Ethyl (B): <10 mg/dL (ref <10)

## 2020-03-23 LAB — LACTIC ACID, PLASMA: Lactic Acid, Venous: 1.2 mmol/L (ref 0.5–1.9)

## 2020-03-23 MED ORDER — PANTOPRAZOLE SODIUM 40 MG PO TBEC
40.0000 mg | DELAYED_RELEASE_TABLET | Freq: Every day | ORAL | Status: DC
  Administered 2020-03-24 – 2020-03-27 (×4): 40 mg via ORAL
  Filled 2020-03-23 (×4): qty 1

## 2020-03-23 MED ORDER — SODIUM CHLORIDE 0.9 % IV SOLN: INTRAVENOUS | Status: AC

## 2020-03-23 MED ORDER — LACTATED RINGERS IV BOLUS
500.0000 mL | Freq: Once | INTRAVENOUS | Status: AC
  Administered 2020-03-23: 500 mL via INTRAVENOUS

## 2020-03-23 MED ORDER — FLUTICASONE PROPIONATE 50 MCG/ACT NA SUSP
1.0000 | Freq: Every day | NASAL | Status: DC | PRN
  Filled 2020-03-23: qty 16

## 2020-03-23 MED ORDER — STROKE: EARLY STAGES OF RECOVERY BOOK
Freq: Once | Status: DC
  Filled 2020-03-23: qty 1

## 2020-03-23 MED ORDER — ACETAMINOPHEN 325 MG PO TABS: 650.0000 mg | ORAL_TABLET | ORAL | Status: DC | PRN

## 2020-03-23 MED ORDER — METOPROLOL TARTRATE 25 MG PO TABS
25.0000 mg | ORAL_TABLET | Freq: Two times a day (BID) | ORAL | Status: DC
  Administered 2020-03-23 – 2020-03-27 (×8): 25 mg via ORAL
  Filled 2020-03-23 (×8): qty 1

## 2020-03-23 MED ORDER — ASPIRIN EC 325 MG PO TBEC
325.0000 mg | DELAYED_RELEASE_TABLET | Freq: Every day | ORAL | Status: DC
  Administered 2020-03-23 – 2020-03-24 (×2): 325 mg via ORAL
  Filled 2020-03-23 (×2): qty 1

## 2020-03-23 MED ORDER — OXYBUTYNIN CHLORIDE 5 MG PO TABS
5.0000 mg | ORAL_TABLET | ORAL | Status: DC
  Administered 2020-03-24 – 2020-03-26 (×2): 5 mg via ORAL
  Filled 2020-03-23 (×3): qty 1

## 2020-03-23 MED ORDER — VITAMIN D 25 MCG (1000 UNIT) PO TABS
1000.0000 [IU] | ORAL_TABLET | Freq: Every day | ORAL | Status: DC
  Administered 2020-03-24 – 2020-03-27 (×4): 1000 [IU] via ORAL
  Filled 2020-03-23 (×4): qty 1

## 2020-03-23 MED ORDER — INFLUENZA VAC A&B SA ADJ QUAD 0.5 ML IM PRSY
0.5000 mL | PREFILLED_SYRINGE | INTRAMUSCULAR | Status: AC
  Administered 2020-03-26: 0.5 mL via INTRAMUSCULAR
  Filled 2020-03-23: qty 0.5

## 2020-03-23 MED ORDER — OMEGA-3-ACID ETHYL ESTERS 1 G PO CAPS
1.0000 g | ORAL_CAPSULE | Freq: Every day | ORAL | Status: DC
  Administered 2020-03-24 – 2020-03-27 (×4): 1 g via ORAL
  Filled 2020-03-23 (×4): qty 1

## 2020-03-23 MED ORDER — ASPIRIN 300 MG RE SUPP: 300.0000 mg | Freq: Every day | RECTAL | Status: DC

## 2020-03-23 MED ORDER — ASPIRIN 81 MG PO TBEC: 81.0000 mg | DELAYED_RELEASE_TABLET | Freq: Every day | ORAL | Status: DC

## 2020-03-23 MED ORDER — CIPROFLOXACIN HCL 500 MG PO TABS
250.0000 mg | ORAL_TABLET | Freq: Every day | ORAL | Status: DC
  Administered 2020-03-24 – 2020-03-27 (×4): 250 mg via ORAL
  Filled 2020-03-23 (×4): qty 1

## 2020-03-23 MED ORDER — ACETAMINOPHEN 650 MG RE SUPP: 650.0000 mg | RECTAL | Status: DC | PRN

## 2020-03-23 MED ORDER — ENOXAPARIN SODIUM 40 MG/0.4ML ~~LOC~~ SOLN
40.0000 mg | SUBCUTANEOUS | Status: DC
  Administered 2020-03-23: 40 mg via SUBCUTANEOUS
  Filled 2020-03-23: qty 0.4

## 2020-03-23 MED ORDER — STROKE: EARLY STAGES OF RECOVERY BOOK
Freq: Once | Status: DC
  Filled 2020-03-23 (×2): qty 1

## 2020-03-23 MED ORDER — IOHEXOL 350 MG/ML SOLN
50.0000 mL | Freq: Once | INTRAVENOUS | Status: AC | PRN
  Administered 2020-03-23: 50 mL via INTRAVENOUS

## 2020-03-23 MED ORDER — CEVIMELINE HCL 30 MG PO CAPS: 30.0000 mg | ORAL_CAPSULE | Freq: Three times a day (TID) | ORAL | Status: DC

## 2020-03-23 MED ORDER — FERROUS SULFATE 325 (65 FE) MG PO TABS
325.0000 mg | ORAL_TABLET | Freq: Every day | ORAL | Status: DC
  Administered 2020-03-24 – 2020-03-27 (×4): 325 mg via ORAL
  Filled 2020-03-23 (×4): qty 1

## 2020-03-23 MED ORDER — SENNOSIDES-DOCUSATE SODIUM 8.6-50 MG PO TABS: 1.0000 | ORAL_TABLET | Freq: Every evening | ORAL | Status: DC | PRN

## 2020-03-23 MED ORDER — ACETAMINOPHEN 160 MG/5ML PO SOLN: 650.0000 mg | ORAL | Status: DC | PRN

## 2020-03-23 NOTE — ED Notes (Signed)
Obtained CBG, 104

## 2020-03-23 NOTE — H&P (Addendum)
History and Physical    Sabrina Alvarez ENI:[id number] DOB: Apr 02, 1928 DOA: 03/23/2020  PCP: Tammi Sou, MD (Confirm with patient/family/NH records and if not entered, this has to be entered at [facility] point of entry) Patient coming from: Home  I have personally briefly reviewed patient's old medical records in [place]  Chief Complaint: Left sided weakness  HPI: Sabrina Alvarez is a 86 y.o. female with medical history significant of recently diagnosed paroxysmal A. fib, chronic iron deficiency anemia on iron supplement, recurrent UTI on chronic antibiotic suppression, GERD, 3rd cranial nerve palsy, presented with new onset of left-sided weakness.  Patient was seen last normal yesterday evening, this morning patient's son found the patient had a new onset left arm weakness, left hand weakness unable to pick up things on the floor and left facial droop.  Last month patient developed generalized weakness, and had a syncope episode and was sent to [facility] for evaluation.  PE was ruled out, cardiology was consulted, echo showed normal LVEF 60 to 65% and mild regurgitation of aortic valve and mitral valve.  Outpatient Holter monitoring showed patient has paroxysmal A. fib.  According to cardiology Dr. Zenia Resides at [facility] note, who proposed systemic anticoagulation but patient refused.  Patient instead was started on aspirin. ED Course: Patient continued to demonstrate left-sided weakness.  Low-grade fever 100.1 on ED record but no leukocytosis.  UA pending.  CT head showed no acute abnormalities.  MRI pending.  Review of Systems: As per HPI otherwise 14 point review of systems negative.    Past Medical History:  Diagnosis Date  . Abnormal finding on thyroid function test    per old records: T3 low, free T4 elevated, TSH normal.  Armour thyroid made her feel worse and T4 went up so med d/c'd and liothyronine 5 mcg tried but also made pt feel worse.  Endo (Dr. Loanne Drilling felt like  Biotin was causing the abnormal TFT's).  Repeat TFTs OFF BIOTIN x 2 wks were NORMAL.  NO FURTHER THYROID TESTING NEEDED.  Marland Kitchen Arthritis    "think it's osteo; got some in my hands"  . Atrial fibrillation ([place]) 02/10/2020   Dr. Roland Rack, [employer] cards-[place],ASA and BB (stopped her ARB)-in sinus-monitor planned--?done  . Bifascicular block   . CAD (coronary artery disease)    Cath 2009  Occluded LAD, 40-50% RCA and circ managed medically.  Repeat 06/22/15 no change.  . Chronic constipation    slow transit.  Barium enema to eval for colonic stricture 10/2014 was NORMAL  . Cognitive dysfunction    short term memory loss--takes ginko biloba  . COPD (chronic obstructive pulmonary disease) ([place])    Changes noted on CXR 2017  . GERD (gastroesophageal reflux disease)    (also LPR) Schatzki's ring, s/p dil '97; food impaction 08/2010, on PPI therapy since w/out further sx so no dil performed  . History of herpes zoster 06/2015   right side of chest; presented with chest pain mimicking USA--cardiac eval showed stable CAD compared to 2009.  Marland Kitchen Hyperkalemia 06/2017   suspected due to increase of losartan from 25mg  bid to 50mg  bid.  Dose lowered back to 25 mg bid K normalized.  After increase again to 50 qAM and 25 qPM potassium 4.8 so I did not increase dose any further (07/20/17).  . Hyperlipidemia   . Hypertension   . Hyponatremia 10/03/2011   Na 131 on labs 02/2015 at [facility].  Baseline Na 132.  . Iron deficiency anemia 11/30/15; 09/2019  Hemoccults neg x 3 12/2015.  ?Mild malabsorption?  09/2019 Hb drop to 8.5, iron low, hemoccult +.  Oral iron started->Dr. Buccini eval->heme neg in his office->obs/follow Hb on oral Fe; order BE if Hb not responding or having ongoing heme+ stool.  . Mixed stress and urge urinary incontinence   . RBBB with left anterior fascicular block    bifascicular block  . Recurrent UTI    Bactrim prophyl, then changed to cefdinir (?med rxn?).  Cipro qd as of 08/2018.  Marland Kitchen Third nerve  palsy 05/2013   presented as diplopia; felt by neuro to be microvascular insult so no carotid dopplers needed (CT and MRI neg for CVA)  . Thrombocytopenia ([place]) 02/2013   Plts 114K  . Uterine cancer ([place]) 2000  . Xerostomia 2017/2018   Age related glandular atrophy + med effect (oxybutynin and gabapentin).  ANA and sjogren's panel NEG 06/2017.    Past Surgical History:  Procedure Laterality Date  . ABDOMINAL HYSTERECTOMY  2000  . APPENDECTOMY  2000  . CARDIAC CATHETERIZATION N/A 06/22/2015   Stable single vessel CAD, EF normal.  Procedure: Left Heart Cath and Coronary Angiography;  Surgeon: Leonie Man, MD;  Location: [facility] CV LAB;  Service: Cardiovascular;  Laterality: N/A;  . CATARACT EXTRACTION W/ INTRAOCULAR LENS  IMPLANT, BILATERAL  1990's  . [place]; 1960  . COLONOSCOPY  04/2001   BE neg 10/2014  . DILATION AND CURETTAGE OF UTERUS    . ESOPHAGOGASTRODUODENOSCOPY  1997   for dysphagia->esoph dilatation done  . HERNIA REPAIR     abdominal "twice"  . LEFT HEART CATHETERIZATION WITH CORONARY ANGIOGRAM N/A 10/04/2011   Procedure: LEFT HEART CATHETERIZATION WITH CORONARY ANGIOGRAM;  Surgeon: Jacolyn Reedy, MD;  Location: [facility] CATH LAB;  Service: Cardiovascular;  Laterality: N/A;  . REPAIR KNEE LIGAMENT  ~ 2008   left  . TONSILLECTOMY     as a child  . TRANSTHORACIC ECHOCARDIOGRAM  05/2013   EF 60-65%, trivial aortic regurg, no wall motion abnorm     reports that she has never smoked. She has never used smokeless tobacco. She reports that she does not drink alcohol and does not use drugs.  Allergies  Allergen Reactions  . Cefdinir Hives  . Macrobid [Nitrofurantoin Macrocrystal] Other (See Comments)    "I had chills & fever"    Family History  Problem Relation Age of Onset  . Arthritis Mother   . AAA (abdominal aortic aneurysm) Mother   . Diabetes Sister   . CVA Father   . Rheum arthritis Sister   . Cancer Neg Hx   . Heart disease Neg Hx   .  Thyroid disease Neg Hx     Prior to Admission medications   Medication Sig Start Date End Date Taking? Authorizing Provider  ASPIRIN LOW DOSE 81 MG EC tablet Take 81 mg by mouth daily. 02/13/20  Yes [provider]  atenolol (TENORMIN) 25 MG tablet Take 25 mg by mouth daily. 03/11/20  Yes [provider]  cevimeline (EVOXAC) 30 MG capsule TAKE 1 CAPSULE 3 TIMES A DAY FOR TREATMENT OF CHRONIC DRY MOUTH Patient taking differently: Take 30 mg by mouth in the morning and at bedtime. TAKE 1 CAPSULE 3 TIMES A DAY FOR TREATMENT OF CHRONIC DRY MOUTH 09/24/19  Yes McGowen, Adrian Blackwater, MD  Cholecalciferol (VITAMIN D3) 1000 UNITS CAPS Take 1,000 Units by mouth daily.    Yes [provider]  ciprofloxacin (CIPRO) 250 MG tablet TAKE 1  TABLET BY MOUTH EVERY DAY FOR BLADDER INFECTION PROPHYLAXIS. 09/24/19  Yes McGowen, Adrian Blackwater, MD  FERROUS SULFATE PO Take 1 tablet by mouth daily.    Yes [provider]  fluticasone (FLONASE) 50 MCG/ACT nasal spray SPRAY 2 SPRAYS INTO EACH NOSTRIL EVERY DAY Patient taking differently: Place 1 spray into both nostrils daily as needed for allergies.  06/09/19  Yes McGowen, Adrian Blackwater, MD  nitroGLYCERIN (NITROSTAT) 0.4 MG SL tablet Place 1 tablet (0.4 mg total) under the tongue every 5 (five) minutes as needed for chest pain. 06/23/15  Yes Jacolyn Reedy, MD  Omega-3 Fatty Acids (FISH OIL) 1000 MG CAPS Take 1 capsule by mouth daily.   Yes [provider]  oxybutynin (DITROPAN) 5 MG tablet 1 tab po every other day Patient taking differently: Take 5 mg by mouth every other day. 1 tab po every other day 08/18/19  Yes McGowen, Adrian Blackwater, MD  pantoprazole (PROTONIX) 40 MG tablet Take 1 tablet (40 mg total) by mouth daily. 09/16/19  Yes McGowen, Adrian Blackwater, MD  losartan (COZAAR) 50 MG tablet TAKE 1/2 TABLET (25 MG TOTAL) BY MOUTH 2 (TWO) TIMES DAILY. Patient not taking: Reported on 03/23/2020 01/29/20   Tammi Sou, MD  polyethylene glycol powder  (GLYCOLAX/MIRALAX) powder TAKE 17 G BY MOUTH 2 (TWO) TIMES DAILY. Patient not taking: Reported on 03/23/2020 01/01/17   Tammi Sou, MD    Physical Exam: Vitals:   03/23/20 1645 03/23/20 1700 03/23/20 1715 03/23/20 1730  BP: (!) 145/75 (!) 161/97 135/84 (!) 159/92  Pulse: 83 87 87   Resp: 17 (!) 21 19 20   Temp:      TempSrc:      SpO2: 96% 98% 96%   Weight:        Constitutional: NAD, calm, comfortable Vitals:   03/23/20 1645 03/23/20 1700 03/23/20 1715 03/23/20 1730  BP: (!) 145/75 (!) 161/97 135/84 (!) 159/92  Pulse: 83 87 87   Resp: 17 (!) 21 19 20   Temp:      TempSrc:      SpO2: 96% 98% 96%   Weight:       Eyes: PERRL, lids and conjunctivae normal ENMT: Mucous membranes are moist. Posterior pharynx clear of any exudate or lesions.Normal dentition.  Neck: normal, supple, no masses, no thyromegaly Respiratory: clear to auscultation bilaterally, no wheezing, no crackles. Normal respiratory effort. No accessory muscle use.  Cardiovascular: Regular rate and rhythm, no murmurs / rubs / gallops. No extremity edema. 2+ pedal pulses. No carotid bruits.  Abdomen: no tenderness, no masses palpated. No hepatosplenomegaly. Bowel sounds positive.  Musculoskeletal: no clubbing / cyanosis. No joint deformity upper and lower extremities. Good ROM, no contractures. Normal muscle tone.  Skin: no rashes, lesions, ulcers. No induration Neurologic: Slight left facial droop, muscle strength left arm and hand 4/5 compared to 5/5 on the right side, coordination sluggish of left arm compared to the right.  Light touch sensation grossly intact. Psychiatric: Normal judgment and insight. Alert and oriented x 3. Normal mood.    Labs on Admission: I have personally reviewed following labs and imaging studies  CBC: Recent Labs  Lab 03/23/20 1600 03/23/20 1615  WBC 7.1  --   NEUTROABS 5.0  --   HGB 11.8* 12.6  HCT 36.6 37.0  MCV 87.6  --   PLT 158  --    Basic Metabolic Panel: Recent  Labs  Lab 03/23/20 1600 03/23/20 1615  NA 132* 133*  K 3.9 3.9  CL 98 97*  CO2 26  --   GLUCOSE 102* 101*  BUN 23 26*  CREATININE 0.81 0.70  CALCIUM 8.6*  --    GFR: Estimated Creatinine Clearance: 40.4 mL/min (by C-G formula based on SCr of 0.7 mg/dL). Liver Function Tests: Recent Labs  Lab 03/23/20 1600  AST 21  ALT 13  ALKPHOS 47  BILITOT 0.9  PROT 6.4*  ALBUMIN 3.4*   No results for input(s): LIPASE, AMYLASE in the last 168 hours. No results for input(s): AMMONIA in the last 168 hours. Coagulation Profile: Recent Labs  Lab 03/23/20 1600  INR 1.2   Cardiac Enzymes: Recent Labs  Lab 03/23/20 1600  CKTOTAL 70   BNP (last 3 results) No results for input(s): PROBNP in the last 8760 hours. HbA1C: No results for input(s): HGBA1C in the last 72 hours. CBG: Recent Labs  Lab 03/23/20 1617  GLUCAP 93   Lipid Profile: No results for input(s): CHOL, HDL, LDLCALC, TRIG, CHOLHDL, LDLDIRECT in the last 72 hours. Thyroid Function Tests: No results for input(s): TSH, T4TOTAL, FREET4, T3FREE, THYROIDAB in the last 72 hours. Anemia Panel: No results for input(s): VITAMINB12, FOLATE, FERRITIN, TIBC, IRON, RETICCTPCT in the last 72 hours. Urine analysis:    Component Value Date/Time   COLORURINE YELLOW 04/19/2018 1622   APPEARANCEUR CLEAR 04/19/2018 1622   LABSPEC 1.015 04/19/2018 1622   PHURINE 7.0 04/19/2018 1622   GLUCOSEU NEGATIVE 04/19/2018 1622   GLUCOSEU NEGATIVE 08/23/2016 1613   HGBUR NEGATIVE 04/19/2018 1622   BILIRUBINUR negative 12/15/2019 1522   KETONESUR NEGATIVE 04/19/2018 1622   PROTEINUR Positive (A) 12/15/2019 1522   PROTEINUR NEGATIVE 04/19/2018 1622   UROBILINOGEN 0.2 12/15/2019 1522   UROBILINOGEN 0.2 08/23/2016 1613   NITRITE negative 12/15/2019 1522   NITRITE POSITIVE (A) 04/19/2018 1622   LEUKOCYTESUR Negative 12/15/2019 1522    Radiological Exams on Admission: DG Chest Portable 1 View  Result Date: 03/23/2020 CLINICAL DATA:   Fever and confusion EXAM: PORTABLE CHEST 1 VIEW COMPARISON:  06/21/2015 FINDINGS: Cardiomegaly. Aortic atherosclerosis and tortuosity. Poor inspiration. Allowing for that, there are probably chronic abnormal interstitial markings without any focal consolidation, collapse or effusion. IMPRESSION: Poor inspiration. Cardiomegaly. Aortic atherosclerosis. Chronic interstitial lung markings. No active process evident. Electronically Signed   By: Nelson Chimes M.D.   On: 03/23/2020 16:07   CT HEAD CODE STROKE WO CONTRAST  Result Date: 03/23/2020 CLINICAL DATA:  Code stroke. EXAM: CT HEAD WITHOUT CONTRAST TECHNIQUE: Contiguous axial images were obtained from the base of the skull through the vertex without intravenous contrast. COMPARISON:  2019 FINDINGS: Brain: No acute intracranial hemorrhage. Possible small area of cortical loss of gray-white differentiation along the right precentral gyrus near the hand motor region. May reflect volume averaging with adjacent sulcus. Chronic small vessel infarct of the right lentiform nucleus. Additional patchy and confluent areas of hypoattenuation in the supratentorial white matter nonspecific but probably reflect moderate chronic microvascular ischemic changes. Prominence of the ventricles and sulci reflects generalized parenchymal volume loss. No extra-axial collection. Vascular: No hyperdense vessel. There is intracranial atherosclerotic calcification at the skull base. Skull: Unremarkable. Sinuses/Orbits: No significant opacification. No acute orbital finding. Other: Mastoid air cells are clear. ASPECTS [facility] Stroke Program Early CT Score) - Ganglionic level infarction (caudate, lentiform nuclei, internal capsule, insula, M1-M3 cortex): 7 - Supraganglionic infarction (M4-M6 cortex): 2 Total score (0-10 with 10 being normal): 9 IMPRESSION: No acute intracranial hemorrhage. Possible small acute cortical infarct involving the right precentral gyrus. Chronic/nonemergent  findings detailed above.  These results were called by telephone at the time of interpretation on 03/23/2020 at 4:31 pm to provider Dr. Erlinda Hong, Who verbally acknowledged these results. Electronically Signed   By: Macy Mis M.D.   On: 03/23/2020 16:45    EKG: Independently reviewed.  Chronic right bundle branch block  Assessment/Plan Active Problems:   CVA (cerebral vascular accident) ([place])  (please populate well all problems here in Problem List. (For example, if patient is on BP meds at home and you resume or decide to hold them, it is a problem that needs to be her. Same for CAD, COPD, HLD and so on)  CVA -According to [facility] cardiologist record patient has newly diagnosed paroxysmal A. fib last month.  Patient only on aspirin.  Ideally patient should be on systemic anticoagulation, however patient has underlying iron deficiency anemia and on iron supplement.  Discussed with son Dellis Filbert on the phone, patient was found to have iron deficiency 5 to 6 months ago and started on iron supplement, but no GI workup done so far.  No history of GI bleed. -MRI pending to find out about size of stroke, and how likely hemorrhage risk.  Will likely need consult GI inpatient versus outpatient to stratify GI bleed risk, if need to commit the patient to long-term systemic anticoagulation otherwise Watchman procedure might be an option. -Asa and Statin for now  Paroxysmal A. Fib -In sinus rhythm, had a brief episode of tachycardia on arrival, will change atenolol to short acting metoprolol for now.  Chronic iron deficiency anemia -Send FOBT -Consult [facility] (Inpt vs Outpt GI workup)  Low-grade fever -Continue suppression Cipro for now, UA pending. -Checks x-ray showed no acute infiltrates and patient does not have a cough.  Denies any diarrhea.  Recurrent UTI history of -As above  DVT prophylaxis: Lovenox code Status: DNR Family Communication: Son at bedside Disposition Plan: Elderly patient came  with CVA symptoms, expect more than 2 midnight hospital stay for stroke work-up. Consults called: Neurology, [facility] (Exchanged text with Dr. Lyndel Safe who confirmed he will send morning team to see the patient) Admission status: Tele admit   Lequita Halt MD [facility] Pager [phone]  03/23/2020, 6:21 PM

## 2020-03-23 NOTE — Consult Note (Signed)
Stroke Neurology Consultation Note  Consult Requested by: Dr. Ron Parker  Reason for Consult: code stroke  Consult Date: 03/23/20   The history was obtained from the son.  During history and examination, all items were able to obtain unless otherwise noted.  History of Present Illness:  Sabrina Alvarez is a 86 y.o. Caucasian female with PMH of hypertension, hyperlipidemia, CAD, left 3rd nerve palsy in 2014, recent fall leading to diagnosis of A. fib/a flutter presented to ED for left facial droop and left arm weakness.  Per son, he saw patient last night 7:30 PM at her baseline.  Today around noontime he came to see patient again found her to have left facial droop and left arm weakness, dropping telephone, dropping the peas off the floor.  Patient cannot tell when the weakness started and was not even aware of the weakness.  She was sent to ER for evaluation.  In ER, patient left facial droop and left arm weakness has improved some.  Glucose 93.  BP 139/93.  CT concerning for right parietal small infarct.  Per son, she had a fall last month at backyard, not able to get up by herself.  She was found 3 hours later, EMS called, she was sent to [facility] for evaluation.  Found to have A. fib at that time.  She followed with Dr. Roland Rack at the [place], had external heart monitoring and was told recently to have a flutter on the monitoring.  She was referred to cardiology EP in [facility].  Currently on aspirin, not on anticoagulation.  LSN: 7:30 PM yesterday tPA Given: No: outside window  Past Medical History:  Diagnosis Date  . Abnormal finding on thyroid function test    per old records: T3 low, free T4 elevated, TSH normal.  [place] thyroid made her feel worse and T4 went up so med d/c'd and liothyronine 5 mcg tried but also made pt feel worse.  Endo (Dr. Loanne Drilling felt like Biotin was causing the abnormal TFT's).  Repeat TFTs OFF BIOTIN x 2 wks were NORMAL.  NO FURTHER THYROID TESTING  NEEDED.  Marland Kitchen Arthritis    "think it's osteo; got some in my hands"  . Atrial fibrillation ([place]) 02/10/2020   Dr. Roland Rack, [facility] cards-[place],ASA and BB (stopped her ARB)-in sinus-monitor planned--?done  . Bifascicular block   . CAD (coronary artery disease)    Cath 2009  Occluded LAD, 40-50% RCA and circ managed medically.  Repeat 06/22/15 no change.  . Chronic constipation    slow transit.  Barium enema to eval for colonic stricture 10/2014 was NORMAL  . Cognitive dysfunction    short term memory loss--takes ginko biloba  . COPD (chronic obstructive pulmonary disease) ([place])    Changes noted on CXR 2017  . GERD (gastroesophageal reflux disease)    (also LPR) Schatzki's ring, s/p dil '97; food impaction 08/2010, on PPI therapy since w/out further sx so no dil performed  . History of herpes zoster 06/2015   right side of chest; presented with chest pain mimicking USA--cardiac eval showed stable CAD compared to 2009.  Marland Kitchen Hyperkalemia 06/2017   suspected due to increase of losartan from 25mg  bid to 50mg  bid.  Dose lowered back to 25 mg bid K normalized.  After increase again to 50 qAM and 25 qPM potassium 4.8 so I did not increase dose any further (07/20/17).  . Hyperlipidemia   . Hypertension   . Hyponatremia 10/03/2011   Na 131 on labs 02/2015 at [facility].  Baseline Na 132.  . Iron deficiency anemia 11/30/15; 09/2019   Hemoccults neg x 3 12/2015.  ?Mild malabsorption?  09/2019 Hb drop to 8.5, iron low, hemoccult +.  Oral iron started->Dr. Buccini eval->heme neg in his office->obs/follow Hb on oral Fe; order BE if Hb not responding or having ongoing heme+ stool.  . Mixed stress and urge urinary incontinence   . RBBB with left anterior fascicular block    bifascicular block  . Recurrent UTI    Bactrim prophyl, then changed to cefdinir (?med rxn?).  Cipro qd as of 08/2018.  Marland Kitchen Third nerve palsy 05/2013   presented as diplopia; felt by neuro to be microvascular insult so no carotid dopplers needed (CT  and MRI neg for CVA)  . Thrombocytopenia ([place]) 02/2013   Plts 114K  . Uterine cancer ([place]) 2000  . Xerostomia 2017/2018   Age related glandular atrophy + med effect (oxybutynin and gabapentin).  ANA and sjogren's panel NEG 06/2017.    Past Surgical History:  Procedure Laterality Date  . ABDOMINAL HYSTERECTOMY  2000  . APPENDECTOMY  2000  . CARDIAC CATHETERIZATION N/A 06/22/2015   Stable single vessel CAD, EF normal.  Procedure: Left Heart Cath and Coronary Angiography;  Surgeon: Leonie Man, MD;  Location: [facility];  Service: Cardiovascular;  Laterality: N/A;  . CATARACT EXTRACTION W/ INTRAOCULAR LENS  IMPLANT, BILATERAL  1990's  . [place]; 1960  . COLONOSCOPY  04/2001   BE neg 10/2014  . DILATION AND CURETTAGE OF UTERUS    . ESOPHAGOGASTRODUODENOSCOPY  1997   for dysphagia->esoph dilatation done  . HERNIA REPAIR     abdominal "twice"  . LEFT HEART CATHETERIZATION WITH CORONARY ANGIOGRAM N/A 10/04/2011   Procedure: LEFT HEART CATHETERIZATION WITH CORONARY ANGIOGRAM;  Surgeon: Jacolyn Reedy, MD;  Location: [facility] CATH LAB;  Service: Cardiovascular;  Laterality: N/A;  . REPAIR KNEE LIGAMENT  ~ 2008   left  . TONSILLECTOMY     as a child  . TRANSTHORACIC ECHOCARDIOGRAM  05/2013   EF 60-65%, trivial aortic regurg, no wall motion abnorm    Family History  Problem Relation Age of Onset  . Arthritis Mother   . AAA (abdominal aortic aneurysm) Mother   . Diabetes Sister   . CVA Father   . Rheum arthritis Sister   . Cancer Neg Hx   . Heart disease Neg Hx   . Thyroid disease Neg Hx     Social History:  reports that she has never smoked. She has never used smokeless tobacco. She reports that she does not drink alcohol and does not use drugs.  Allergies:  Allergies  Allergen Reactions  . Cefdinir Hives  . Macrobid [Nitrofurantoin Macrocrystal] Other (See Comments)    "I had chills & fever"    No current facility-administered medications on file prior to  encounter.   Current Outpatient Medications on File Prior to Encounter  Medication Sig Dispense Refill  . acetaminophen (TYLENOL) 325 MG tablet Take 325 mg by mouth every 6 (six) hours as needed.    . cevimeline (EVOXAC) 30 MG capsule TAKE 1 CAPSULE 3 TIMES A DAY FOR TREATMENT OF CHRONIC DRY MOUTH 270 capsule 3  . Cholecalciferol (VITAMIN D3) 1000 UNITS CAPS Take 1,000 Units by mouth daily.     . ciprofloxacin (CIPRO) 250 MG tablet TAKE 1 TABLET BY MOUTH EVERY DAY FOR BLADDER INFECTION PROPHYLAXIS. 90 tablet 1  . FERROUS SULFATE PO Take by mouth daily.    Marland Kitchen  fluticasone (FLONASE) 50 MCG/ACT nasal spray SPRAY 2 SPRAYS INTO EACH NOSTRIL EVERY DAY 48 mL 0  . losartan (COZAAR) 50 MG tablet TAKE 1/2 TABLET (25 MG TOTAL) BY MOUTH 2 (TWO) TIMES DAILY. 90 tablet 0  . nitroGLYCERIN (NITROSTAT) 0.4 MG SL tablet Place 1 tablet (0.4 mg total) under the tongue every 5 (five) minutes as needed for chest pain. (Patient not taking: Reported on 12/15/2019) 25 tablet 12  . Omega-3 Fatty Acids (FISH OIL) 1000 MG CAPS Take 1 capsule by mouth daily.    Marland Kitchen oxybutynin (DITROPAN) 5 MG tablet 1 tab po every other day 45 tablet 3  . pantoprazole (PROTONIX) 40 MG tablet Take 1 tablet (40 mg total) by mouth daily. 30 tablet 1  . polyethylene glycol powder (GLYCOLAX/MIRALAX) powder TAKE 17 G BY MOUTH 2 (TWO) TIMES DAILY. 527 g 5    Review of Systems: A full ROS was attempted today and was able to be performed.  Systems assessed include - Constitutional, Eyes, HENT, Respiratory, Cardiovascular, Gastrointestinal, Genitourinary, Integument/breast, Hematologic/lymphatic, Musculoskeletal, Neurological, Behavioral/Psych, Endocrine, Allergic/Immunologic - with pertinent responses as per HPI.  Physical Examination: Temp:  [100.1 F (37.8 C)-100.2 F (37.9 C)] 100.2 F (37.9 C) (10/19 1639) Pulse Rate:  [83-129] 83 (10/19 1645) Resp:  [17-21] 17 (10/19 1645) BP: (138-145)/(75-94) 145/75 (10/19 1645) SpO2:  [94 %-96 %] 96 %  (10/19 1645) Weight:  [57.8 kg] 57.8 kg (10/19 1600)  General - well nourished, well developed, in no apparent distress.    Ophthalmologic - fundi not visualized due to noncooperation.    Cardiovascular - irregularly irregular heart rate and rhythm, in the knowledge of A. fib, a flutter, PVCs, short run of V-tach and sinus rhythm.  Mental Status -  Level of arousal and orientation to place, and person were intact, not orientated to age and time. Language including expression, naming, repetition, comprehension, reading, and writing was assessed and found intact.  Cranial Nerves II - XII - II - Vision intact OU. III, IV, VI - Extraocular movements intact. V - Facial sensation intact bilaterally. VII - mild left nasolabial fold flattening. VIII - Hearing & vestibular intact bilaterally. X - Palate elevates symmetrically. XI - Chin turning & shoulder shrug intact bilaterally. XII - Tongue protrusion intact.  Motor Strength - The patient's strength was symmetrical except left upper extremity pronator drift was present.   Motor Tone & Bulk - Muscle tone was assessed at the neck and appendages and was normal.  Bulk was normal and fasciculations were absent.   Reflexes - The patient's reflexes were normal in all extremities and she had no pathological reflexes.  Sensory - Light touch, temperature/pinprick were assessed and were normal.    Coordination - The patient had normal movements in the right hand with no ataxia or dysmetria.  However, left finger-to-nose ataxic out of proportion to the weakness.  Tremor was absent.  Gait and Station - deferred  NIH Stroke Scale  Level Of Consciousness 0=Alert; keenly responsive 1=Arouse to minor stimulation 2=Requires repeated stimulation to arouse or movements to pain 3=postures or unresponsive 0  LOC Questions to Month and Age 21=Answers both questions correctly 1=Answers one question correctly or dysarthria/intubated/trauma/language  barrier 2=Answers neither question correctly or aphasia 2  LOC Commands      -Open/Close eyes     -Open/close grip     -Pantomime commands if communication barrier 0=Performs both tasks correctly 1=Performs one task correctly 2=Performs neighter task correctly 0  Best Gaze     -Only  assess horizontal gaze 0=Normal 1=Partial gaze palsy 2=Forced deviation, or total gaze paresis 0  Visual 0=No visual loss 1=Partial hemianopia 2=Complete hemianopia 3=Bilateral hemianopia (blind including cortical blindness) 0  Facial Palsy     -Use grimace if obtunded 0=Normal symmetrical movement 1=Minor paralysis (asymmetry) 2=Partial paralysis (lower face) 3=Complete paralysis (upper and lower face) 1  Motor  0=No drift for 10/5 seconds 1=Drift, but does not hit bed 2=Some antigravity effort, hits  bed 3=No effort against gravity, limb falls 4=No movement 0=Amputation/joint fusion Right Arm 0     Leg 1    Left Arm 1     Leg 1  Limb Ataxia     - FNT/HTS 0=Absent or does not understand or paralyzed or amputation/joint fusion 1=Present in one limb 2=Present in two limbs 1  Sensory 0=Normal 1=Mild to moderate sensory loss 2=Severe to total sensory loss or coma/unresponsive 0  Best Language 0=No aphasia, normal 1=Mild to moderate aphasia 2=Severe aphasia 3=Mute, global aphasia, or coma/unresponsive 0  Dysarthria 0=Normal 1=Mild to moderate 2=Severe, unintelligible or mute/anarthric 0=intubated/unable to test 0  Extinction/Neglect 0=No abnormality 1=visual/tactile/auditory/spatia/personal inattention/Extinction to bilateral simultaneous stimulation 2=Profound neglect/extinction more than 1 modality  0  Total   7     Data Reviewed: DG Chest Portable 1 View  Result Date: 03/23/2020 CLINICAL DATA:  Fever and confusion EXAM: PORTABLE CHEST 1 VIEW COMPARISON:  06/21/2015 FINDINGS: Cardiomegaly. Aortic atherosclerosis and tortuosity. Poor inspiration. Allowing for that, there are probably  chronic abnormal interstitial markings without any focal consolidation, collapse or effusion. IMPRESSION: Poor inspiration. Cardiomegaly. Aortic atherosclerosis. Chronic interstitial lung markings. No active process evident. Electronically Signed   By: Nelson Chimes M.D.   On: 03/23/2020 16:07   CT HEAD CODE STROKE WO CONTRAST  Result Date: 03/23/2020 CLINICAL DATA:  Code stroke. EXAM: CT HEAD WITHOUT CONTRAST TECHNIQUE: Contiguous axial images were obtained from the base of the skull through the vertex without intravenous contrast. COMPARISON:  2019 FINDINGS: Brain: No acute intracranial hemorrhage. Possible small area of cortical loss of gray-white differentiation along the right precentral gyrus near the hand motor region. May reflect volume averaging with adjacent sulcus. Chronic small vessel infarct of the right lentiform nucleus. Additional patchy and confluent areas of hypoattenuation in the supratentorial white matter nonspecific but probably reflect moderate chronic microvascular ischemic changes. Prominence of the ventricles and sulci reflects generalized parenchymal volume loss. No extra-axial collection. Vascular: No hyperdense vessel. There is intracranial atherosclerotic calcification at the skull base. Skull: Unremarkable. Sinuses/Orbits: No significant opacification. No acute orbital finding. Other: Mastoid air cells are clear. ASPECTS [facility] Stroke Program Early CT Score) - Ganglionic level infarction (caudate, lentiform nuclei, internal capsule, insula, M1-M3 cortex): 7 - Supraganglionic infarction (M4-M6 cortex): 2 Total score (0-10 with 10 being normal): 9 IMPRESSION: No acute intracranial hemorrhage. Possible small acute cortical infarct involving the right precentral gyrus. Chronic/nonemergent findings detailed above. These results were called by telephone at the time of interpretation on 03/23/2020 at 4:31 pm to provider Dr. Erlinda Hong, Who verbally acknowledged these results. Electronically  Signed   By: Macy Mis M.D.   On: 03/23/2020 16:45    Assessment: 86 y.o. female with PMH of hypertension, hyperlipidemia, CAD, left 3rd nerve palsy in 2014, recent fall leading to diagnosis of A. fib/a flutter presented to ED for left facial droop and left arm weakness.  Last seen well at 7:30 PM yesterday.  NIH score 7 due to left facial droop, left arm mild weakness and mild ataxia.  Patient not a TPA  candidate given outside window.  Not a IR candidate given mild symptoms.  In ER, patient still has irregular heart rhythm.  Etiology for patient symptoms most likely due to stroke in the setting of PAF/a flutter not on anticoagulation.  Recommend MRI brain without contrast, CT head and neck, routine stroke work-up.  Recommend admission by hospitalist and neurology will follow.  Stroke Risk Factors - atrial fibrillation, hyperlipidemia and hypertension  Plan: - HgbA1c, fasting lipid panel - MRI of the brain without contrast - PT consult, OT consult, speech consult - Echocardiogram - Routine CTA head and neck - Prophylactic therapy-Antiplatelet med: Aspirin - dose 325 - Risk factor modification - Telemetry monitoring - Frequent neuro checks  Thank you for this consultation and allowing [place] to participate in the care of this patient. Will follow.  Rosalin Hawking, MD PhD Stroke Neurology 03/23/2020 5:02 PM

## 2020-03-23 NOTE — Code Documentation (Addendum)
Stroke Response Nurse Documentation Code Documentation  Sabrina Alvarez is a 86 y.o. female arriving to [place]. [facility] ED via EMS on 10/19 with past medical hx of atrial flutter per son. Code stroke was activated by ED. Patient from home where she was LKW at [place] and now complaining of left sided weakness and facial droop.   Son was with patient yesterday when she was blowing leaves. He helped her get into the house and put everything away at [place]. Today, at lunch, he came in to patient cleaning up peas off the floor. He helped her up and noted that she had left hand weakness. Called EMS and they transported patient to the ED.   ED evaluated patient and activated a Code Stroke when he noted left arm weakness and left facial droop. Pt does not specifically recall when left sided weakness started. Report getting out of bed slower this morning, but she was able to get out and get herself dressed.   Stroke team at the bedside after Code Stroke called. Labs drawn and patient cleared for CT by Dr. Ron Parker. Patient to CT with team. NIHSS 7, see documentation for details and code stroke times. Patient with disoriented, left facial droop, left arm weakness, bilateral leg weakness and left limb ataxia on exam. The following imaging was completed: CT. Patient is not a candidate for tPA due to being outside window.  Care/Plan - q2 hour mNIHSS and VS. Bedside handoff with ED RN Daija.    Kathrin Greathouse  Stroke Response RN

## 2020-03-23 NOTE — ED Notes (Signed)
Pt transported to CT ?

## 2020-03-23 NOTE — ED Triage Notes (Signed)
Per ems they were called for a stroke. When ems got there son stated he found patient on the floor about an hour ago. She didn't fall but was trying to pick up a can of peas and couldn't grip with her left hand.  Per ems pt is in uncontrolled a flutter. Pt is not on any blood thinners per family/pt. Pt son last seen her yesterday at 36. Pt coordination with her left side was "really off" with ems. Vitals were 126/60 bp, 68 hr, 24 RR, cbg 126 an d96% o2.

## 2020-03-23 NOTE — ED Provider Notes (Signed)
[facility] EMERGENCY DEPARTMENT Provider Note   CSN: [id number] Arrival date & time: 03/23/20  1539     History Chief Complaint  Patient presents with  . stroke like symptoms    Sabrina Alvarez is a 86 y.o. female history of A. fib, not anticoagulated, CAD, short-term memory loss, COPD, GERD, hypertension, hyperlipidemia, right bundle branch block.  Patient arrives via EMS today for concern of left upper extremity weakness.  History obtained from nursing staff who spoke with EMS, patient's son found her trying to pick up some pieces off the floor but was having difficulty using her left arm, he was concerned and called EMS for evaluation. - History obtained by patient: Patient reports that she is feeling well at this time she denies any left upper extremity difficulties, she is not sure why she is here reports no concerns. - Spoke with patient's son Dellis Filbert over the phone, he reports that he last visited his mother around 7:30 PM last night she was in her normal state of health.  The fire alarm was going off at the home today and he came over to visit he found the patient on her hands and knees trying to pick up some pieces off of the ground but she was not able to use her left hand to actually pick them up.  He then called EMS evaluation.  He reports otherwise patient has been in her normal state of health, no falls in the last few weeks, no fever or chills vomiting or sick contacts that they are aware of.  HPI     Past Medical History:  Diagnosis Date  . Abnormal finding on thyroid function test    per old records: T3 low, free T4 elevated, TSH normal.  [place] thyroid made her feel worse and T4 went up so med d/c'd and liothyronine 5 mcg tried but also made pt feel worse.  Endo (Dr. Loanne Drilling felt like Biotin was causing the abnormal TFT's).  Repeat TFTs OFF BIOTIN x 2 wks were NORMAL.  NO FURTHER THYROID TESTING NEEDED.  Marland Kitchen Arthritis    "think it's osteo; got some  in my hands"  . Atrial fibrillation ([place]) 02/10/2020   Dr. Roland Rack, [facility] cards-[place],ASA and BB (stopped her ARB)-in sinus-monitor planned--?done  . Bifascicular block   . CAD (coronary artery disease)    Cath 2009  Occluded LAD, 40-50% RCA and circ managed medically.  Repeat 06/22/15 no change.  . Chronic constipation    slow transit.  Barium enema to eval for colonic stricture 10/2014 was NORMAL  . Cognitive dysfunction    short term memory loss--takes ginko biloba  . COPD (chronic obstructive pulmonary disease) ([place])    Changes noted on CXR 2017  . GERD (gastroesophageal reflux disease)    (also LPR) Schatzki's ring, s/p dil '97; food impaction 08/2010, on PPI therapy since w/out further sx so no dil performed  . History of herpes zoster 06/2015   right side of chest; presented with chest pain mimicking USA--cardiac eval showed stable CAD compared to 2009.  Marland Kitchen Hyperkalemia 06/2017   suspected due to increase of losartan from 25mg  bid to 50mg  bid.  Dose lowered back to 25 mg bid K normalized.  After increase again to 50 qAM and 25 qPM potassium 4.8 so I did not increase dose any further (07/20/17).  . Hyperlipidemia   . Hypertension   . Hyponatremia 10/03/2011   Na 131 on labs 02/2015 at [facility].  Baseline Na 132.  Marland Kitchen  Iron deficiency anemia 11/30/15; 09/2019   Hemoccults neg x 3 12/2015.  ?Mild malabsorption?  09/2019 Hb drop to 8.5, iron low, hemoccult +.  Oral iron started->Dr. Buccini eval->heme neg in his office->obs/follow Hb on oral Fe; order BE if Hb not responding or having ongoing heme+ stool.  . Mixed stress and urge urinary incontinence   . RBBB with left anterior fascicular block    bifascicular block  . Recurrent UTI    Bactrim prophyl, then changed to cefdinir (?med rxn?).  Cipro qd as of 08/2018.  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 Thrombocytopenia ([place]) 02/2013     Plts 114K  . Uterine cancer ([place]) 2000  . Xerostomia 2017/2018   Age related glandular atrophy + med effect (oxybutynin and gabapentin).  ANA and sjogren's panel NEG 06/2017.    Patient Active Problem List   Diagnosis Date Noted  . CVA (cerebral vascular accident) ([place]) 03/23/2020  . Osteopenia 04/22/2019  . Overactive bladder 04/22/2019  . Hyperkalemia 06/05/2017  . Chronic constipation 02/25/2016  . Iron deficiency anemia 12/03/2015  . Postherpetic neuralgia 09/01/2015  . Cerumen impaction 09/01/2015  . Thyroid disorder 05/26/2015  . Third nerve palsy 05/29/2013  . Essential hypertension   . Hyperlipidemia   . GERD (gastroesophageal reflux disease)   . History of malignant neoplasm of vagina   . Coronary artery disease, occlusive;  100 % CTO of LAD  06/22/2007    Past Surgical History:  Procedure Laterality Date  . ABDOMINAL HYSTERECTOMY  2000  . APPENDECTOMY  2000  . CARDIAC CATHETERIZATION N/A 06/22/2015   Stable single vessel CAD, EF normal.  Procedure: Left Heart Cath and Coronary Angiography;  Surgeon: Leonie Man, MD;  Location: [facility] CV LAB;  Service: Cardiovascular;  Laterality: N/A;  . CATARACT EXTRACTION W/ INTRAOCULAR LENS  IMPLANT, BILATERAL  1990's  . [place]; 1960  . COLONOSCOPY  04/2001   BE neg 10/2014  . DILATION AND CURETTAGE OF UTERUS    . ESOPHAGOGASTRODUODENOSCOPY  1997   for dysphagia->esoph dilatation done  . HERNIA REPAIR     abdominal "twice"  . LEFT HEART CATHETERIZATION WITH CORONARY ANGIOGRAM N/A 10/04/2011   Procedure: LEFT HEART CATHETERIZATION WITH CORONARY ANGIOGRAM;  Surgeon: Jacolyn Reedy, MD;  Location: [facility] CATH LAB;  Service: Cardiovascular;  Laterality: N/A;  . REPAIR KNEE LIGAMENT  ~ 2008   left  . TONSILLECTOMY     as a child  . TRANSTHORACIC ECHOCARDIOGRAM  05/2013   EF 60-65%, trivial aortic regurg, no wall motion abnorm     OB History   No obstetric history on file.     Family History  Problem  Relation Age of Onset  . Arthritis Mother   . AAA (abdominal aortic aneurysm) Mother   . Diabetes Sister   . CVA Father   . Rheum arthritis Sister   . Cancer Neg Hx   . Heart disease Neg Hx   . Thyroid disease Neg Hx     Social History   Tobacco Use  . Smoking status: Never Smoker  . Smokeless tobacco: Never Used  Vaping Use  . Vaping Use: Never used  Substance Use Topics  . Alcohol use: No  . Drug use: No    Home Medications Prior to Admission medications   Medication Sig Start Date End Date Taking? Authorizing Provider  ASPIRIN LOW DOSE 81 MG EC tablet Take 81 mg by mouth daily. 02/13/20   [provider]  atenolol (TENORMIN) 25 MG tablet Take 25 mg by mouth daily. 03/11/20   [provider]  cevimeline (EVOXAC) 30 MG capsule TAKE 1 CAPSULE 3 TIMES A DAY FOR TREATMENT OF CHRONIC DRY MOUTH 09/24/19   McGowen, Adrian Blackwater, MD  Cholecalciferol (VITAMIN D3) 1000 UNITS CAPS Take 1,000 Units by mouth daily.     [provider]  ciprofloxacin (CIPRO) 250 MG tablet TAKE 1 TABLET BY MOUTH EVERY DAY FOR BLADDER INFECTION PROPHYLAXIS. 09/24/19   McGowen, Adrian Blackwater, MD  FERROUS SULFATE PO Take by mouth daily.    [provider]  fluticasone (FLONASE) 50 MCG/ACT nasal spray SPRAY 2 SPRAYS INTO EACH NOSTRIL EVERY DAY 06/09/19   McGowen, Adrian Blackwater, MD  losartan (COZAAR) 50 MG tablet TAKE 1/2 TABLET (25 MG TOTAL) BY MOUTH 2 (TWO) TIMES DAILY. 01/29/20   McGowen, Adrian Blackwater, MD  nitroGLYCERIN (NITROSTAT) 0.4 MG SL tablet Place 1 tablet (0.4 mg total) under the tongue every 5 (five) minutes as needed for chest pain. Patient not taking: Reported on 12/15/2019 06/23/15   Jacolyn Reedy, MD  Omega-3 Fatty Acids (FISH OIL) 1000 MG CAPS Take 1 capsule by mouth daily.    [provider]  oxybutynin (DITROPAN) 5 MG tablet 1 tab po every other day 08/18/19   McGowen, Adrian Blackwater, MD  pantoprazole (PROTONIX) 40 MG tablet Take 1 tablet (40 mg total) by mouth daily. 09/16/19    McGowen, Adrian Blackwater, MD  polyethylene glycol powder (GLYCOLAX/MIRALAX) powder TAKE 17 G BY MOUTH 2 (TWO) TIMES DAILY. 01/01/17   McGowen, Adrian Blackwater, MD    Allergies    Cefdinir and Macrobid [nitrofurantoin macrocrystal]  Review of Systems   Review of Systems Ten systems are reviewed and are negative for acute change except as noted in the HPI  Physical Exam Updated Vital Signs BP (!) 161/97   Pulse 87   Temp 100.2 F (37.9 C) (Rectal)   Resp (!) 21   Wt 57.8 kg   SpO2 98%   BMI 21.87 kg/m   Physical Exam Constitutional:      General: She is not in acute distress.    Appearance: Normal appearance. She is well-developed. She is not ill-appearing or diaphoretic.  HENT:     Head: Normocephalic and atraumatic.  Eyes:     General: Vision grossly intact. Gaze aligned appropriately.     Pupils: Pupils are equal, round, and reactive to light.  Neck:     Trachea: Trachea and phonation normal.  Cardiovascular:     Rate and Rhythm: Tachycardia present.  Pulmonary:     Effort: Pulmonary effort is normal. No respiratory distress.  Abdominal:     General: There is no distension.     Palpations: Abdomen is soft.     Tenderness: There is no abdominal tenderness. There is no guarding or rebound.  Musculoskeletal:        General: Normal range of motion.     Cervical back: Normal range of motion.  Skin:    General: Skin is warm and dry.  Neurological:     Mental Status: She is alert.     GCS: GCS eye subscore is 4. GCS verbal subscore is 5. GCS motor subscore is 6.     Comments: Mental Status: Alert, oriented, thought content appropriate, able to give a coherent history. Speech fluent without evidence of aphasia. Able to follow  2 step commands without difficulty. Cranial Nerves: II: Peripheral visual fields grossly normal, pupils equal, round, reactive to light III,IV, VI: ptosis not present, extra-ocular motions intact bilaterally V,VII: Questionable left lower facial droop,  eyebrows raise symmetric, facial light touch sensation equal VIII: hearing grossly normal to voice X: uvula elevates symmetrically XI: bilateral shoulder shrug symmetric and strong XII: midline tongue extension without fassiculations Motor: Normal tone.  3/5 strength left upper extremity compared to right, equal strength bilateral lower extremities. Sensory: Sensation intact to light touch in all extremities. Cerebellar: normal finger-to-nose with bilateral upper extremities. Normal heel-to -shin balance bilaterally of the lower extremity.  Left arm pronator drift CV: distal pulses palpable throughout  Psychiatric:        Behavior: Behavior normal.    ED Results / Procedures / Treatments   Labs (all labs ordered are listed, but only abnormal results are displayed) Labs Reviewed  CBC - Abnormal; Notable for the following components:      Result Value   Hemoglobin 11.8 (*)    All other components within normal limits  COMPREHENSIVE METABOLIC PANEL - Abnormal; Notable for the following components:   Sodium 132 (*)    Glucose, Bld 102 (*)    Calcium 8.6 (*)    Total Protein 6.4 (*)    Albumin 3.4 (*)    All other components within normal limits  I-STAT CHEM 8, ED - Abnormal; Notable for the following components:   Sodium 133 (*)    Chloride 97 (*)    BUN 26 (*)    Glucose, Bld 101 (*)    Calcium, Ion 1.05 (*)    All other components within normal limits  RESPIRATORY PANEL BY RT PCR (FLU A&B, COVID)  CULTURE, BLOOD (ROUTINE X 2)  CULTURE, BLOOD (ROUTINE X 2)  ETHANOL  PROTIME-INR  APTT  DIFFERENTIAL  CK  LACTIC ACID, PLASMA  HEMOGLOBIN A1C  LIPID PANEL  URINALYSIS, COMPLETE (UACMP) WITH MICROSCOPIC  CBG MONITORING, ED    EKG EKG Interpretation  Date/Time:  Tuesday March 23 2020 15:42:53 EDT Ventricular Rate:  126 PR Interval:    QRS Duration: 151 QT Interval:  363 QTC Calculation: 526 R Axis:   -98 Text Interpretation: Sinus tachycardia Right bundle  branch block Inferolateral infarct, old Confirmed by Dewaine Conger [phone]) on 03/23/2020 3:50:50 PM   Radiology DG Chest Portable 1 View  Result Date: 03/23/2020 CLINICAL DATA:  Fever and confusion EXAM: PORTABLE CHEST 1 VIEW COMPARISON:  06/21/2015 FINDINGS: Cardiomegaly. Aortic atherosclerosis and tortuosity. Poor inspiration. Allowing for that, there are probably chronic abnormal interstitial markings without any focal consolidation, collapse or effusion. IMPRESSION: Poor inspiration. Cardiomegaly. Aortic atherosclerosis. Chronic interstitial lung markings. No active process evident. Electronically Signed   By: Nelson Chimes M.D.   On: 03/23/2020 16:07   CT HEAD CODE STROKE WO CONTRAST  Result Date: 03/23/2020 CLINICAL DATA:  Code stroke. EXAM: CT HEAD WITHOUT CONTRAST TECHNIQUE: Contiguous axial images were obtained from the base of the skull through the vertex without intravenous contrast. COMPARISON:  2019 FINDINGS: Brain: No acute intracranial hemorrhage. Possible small area of cortical loss of gray-white differentiation along the right precentral gyrus near the hand motor region. May reflect volume averaging with adjacent sulcus. Chronic small vessel infarct of the right lentiform nucleus. Additional patchy and confluent areas of hypoattenuation in the supratentorial white matter nonspecific but probably reflect moderate chronic microvascular ischemic changes. Prominence of the ventricles and sulci reflects generalized parenchymal volume loss. No extra-axial collection. Vascular: No hyperdense  vessel. There is intracranial atherosclerotic calcification at the skull base. Skull: Unremarkable. Sinuses/Orbits: No significant opacification. No acute orbital finding. Other: Mastoid air cells are clear. ASPECTS [facility] Stroke Program Early CT Score) - Ganglionic level infarction (caudate, lentiform nuclei, internal capsule, insula, M1-M3 cortex): 7 - Supraganglionic infarction (M4-M6 cortex): 2 Total  score (0-10 with 10 being normal): 9 IMPRESSION: No acute intracranial hemorrhage. Possible small acute cortical infarct involving the right precentral gyrus. Chronic/nonemergent findings detailed above. These results were called by telephone at the time of interpretation on 03/23/2020 at 4:31 pm to provider Dr. Erlinda Hong, Who verbally acknowledged these results. Electronically Signed   By: Macy Mis M.D.   On: 03/23/2020 16:45    Procedures .Critical Care Performed by: Deliah Boston, PA-C Authorized by: Deliah Boston, PA-C   Critical care provider statement:    Critical care time (minutes):  40   Critical care was necessary to treat or prevent imminent or life-threatening deterioration of the following conditions:  CNS failure or compromise   Critical care was time spent personally by me on the following activities:  Discussions with consultants, evaluation of patient's response to treatment, examination of patient, ordering and performing treatments and interventions, ordering and review of laboratory studies, ordering and review of radiographic studies, pulse oximetry, re-evaluation of patient's condition, obtaining history from patient or surrogate, review of old charts and development of treatment plan with patient or surrogate   (including critical care time)  Medications Ordered in ED Medications  aspirin EC tablet 325 mg (has no administration in time range)    Or  aspirin suppository 300 mg (has no administration in time range)   stroke: mapping our early stages of recovery book (has no administration in time range)  lactated ringers bolus 500 mL (500 mLs Intravenous New Bag/Given 03/23/20 1630)    ED Course  I have reviewed the triage vital signs and the nursing notes.  Pertinent labs & imaging results that were available during my care of the patient were reviewed by me and considered in my medical decision making (see chart for details).  Clinical Course as of Mar 23 1732  Tue Mar 23, 2020  1549 Jacqulynn Cadet   [BM]  3532 Dr. Roosevelt Locks   [BM]    Clinical Course User Index [BM] Gari Crown   MDM Rules/Calculators/A&P                         Additional history obtained from: 1. Nursing notes from this visit. 2. Family. 3. Electronic medical record. --------------- 86 year old female arrived from home for left upper extremity weakness.  Last known well was around 7:30 PM last night.  On exam patient has questionable left facial droop.  Otherwise cranial nerves intact.  No evidence of injury, no meningeal signs.  Patient is tachycardic, lung sounds unremarkable.  Abdomen soft nontender without peritoneal signs.  She has 3/5 strength of her left upper extremity compared to right and a left-sided pronator drift.  On my exam patient denies any difficulty using her left arm, she has no visual deficits or aphasia.  Question that patient may have some neglect as she does not recognize the weakness of her left upper extremity which is present on exam, discussed case with Dr. Ron Parker, code stroke initiated.  Additionally patient is tachycardic here and has oral temperature of 100.1 F.  Patient does not have any infectious type symptoms or recent illness, will add lactic, blood cultures  and obtain a rectal temp as well.  500 mL fluid bolus ordered in case possible infectious process, currently does not meet sepsis/SIRS criteria.. - I ordered, reviewed and interpreted labs which include: Lactic acid within normal limits PT/INR APTT within normal limits CBC without leukocytosis to suggest infection, mild baseline anemia of 11.8 Ethanol within normal limits, no evidence of intoxication or withdrawal CMP without emergent electrolyte derangement, AKI, LFT elevations or gap CK within normal limits CBG within normal limits Covid/influenza panel pending Urinalysis pending  CXR:  IMPRESSION:  Poor inspiration. Cardiomegaly. Aortic atherosclerosis. Chronic    interstitial lung markings. No active process evident.   EKG: Sinus tachycardia Right bundle branch block Inferolateral infarct, old Confirmed by Dewaine Conger [phone]) on 03/23/2020 3:50:50 PM  CT Head:  IMPRESSION:  No acute intracranial hemorrhage. Possible small acute cortical  infarct involving the right precentral gyrus.    Chronic/nonemergent findings detailed above.    These results were called by telephone at the time of interpretation  on 03/23/2020 at 4:31 pm to provider Dr. Erlinda Hong, Who verbally  acknowledged these results.  ----------------------- Patient seen and evaluated by neurologist Dr. Erlinda Hong, requests hospitalist admission for assistance regarding tachycardia and possible infectious process.  Patient not a TPA candidate. - Patient reevaluated resting comfortably no acute distress, they are agreeable for admission.  Tachycardia improved, no source of infection, does not appear toxic or septic no indication for antibiotics at this time.  Urinalysis and Covid test pending. - 5:21 PM: Discussed case with Dr. Roosevelt Locks, patient accepted to hospitalist service.  Patient was seen and evaluated by Dr. Ron Parker during this visit.  Note: Portions of this report may have been transcribed using voice recognition software. Every effort was made to ensure accuracy; however, inadvertent computerized transcription errors may still be present. Final Clinical Impression(s) / ED Diagnoses Final diagnoses:  Cerebrovascular accident (CVA), unspecified mechanism [facility])    Rx / DC Orders ED Discharge Orders    None       Gari Crown 03/23/20 1733    Breck Coons, MD 03/23/20 Einar Crow

## 2020-03-24 ENCOUNTER — Inpatient Hospital Stay (HOSPITAL_COMMUNITY): Payer: Medicare HMO

## 2020-03-24 DIAGNOSIS — I6389 Other cerebral infarction: Secondary | ICD-10-CM

## 2020-03-24 DIAGNOSIS — I361 Nonrheumatic tricuspid (valve) insufficiency: Secondary | ICD-10-CM

## 2020-03-24 DIAGNOSIS — I351 Nonrheumatic aortic (valve) insufficiency: Secondary | ICD-10-CM | POA: Diagnosis not present

## 2020-03-24 LAB — LIPID PANEL
Cholesterol: 152 mg/dL (ref 0–200)
HDL: 33 mg/dL — ABNORMAL LOW (ref >40)
LDL Cholesterol: 111 mg/dL — ABNORMAL HIGH (ref 0–99)
Total CHOL/HDL Ratio: 4.6 RATIO
Triglycerides: 40 mg/dL (ref <150)
VLDL: 8 mg/dL (ref 0–40)

## 2020-03-24 LAB — ECHOCARDIOGRAM COMPLETE
Height: 64 in
P 1/2 time: 711 msec
S' Lateral: 2.6 cm
Weight: 1887.14 oz

## 2020-03-24 LAB — OCCULT BLOOD X 1 CARD TO LAB, STOOL: Fecal Occult Bld: POSITIVE — AB

## 2020-03-24 LAB — GLUCOSE, CAPILLARY: Glucose-Capillary: 104 mg/dL — ABNORMAL HIGH (ref 70–99)

## 2020-03-24 MED ORDER — HALOPERIDOL LACTATE 5 MG/ML IJ SOLN
2.0000 mg | Freq: Once | INTRAMUSCULAR | Status: AC
  Administered 2020-03-24: 2 mg via INTRAVENOUS
  Filled 2020-03-24: qty 1

## 2020-03-24 MED ORDER — APIXABAN 2.5 MG PO TABS
2.5000 mg | ORAL_TABLET | Freq: Two times a day (BID) | ORAL | Status: DC
  Administered 2020-03-24 – 2020-03-27 (×6): 2.5 mg via ORAL
  Filled 2020-03-24 (×6): qty 1

## 2020-03-24 MED ORDER — ATORVASTATIN CALCIUM 40 MG PO TABS
40.0000 mg | ORAL_TABLET | Freq: Every day | ORAL | Status: DC
  Administered 2020-03-24 – 2020-03-27 (×4): 40 mg via ORAL
  Filled 2020-03-24 (×4): qty 1

## 2020-03-24 NOTE — Consult Note (Signed)
Referring Provider: Dr. Sloan Leiter Primary Care Physician:  Tammi Sou, MD Primary Gastroenterologist:  Dr. Cristina Gong   Reason for Consultation:  Anemia  HPI: Sabrina Alvarez is a 86 y.o. female with history of iron deficiency anemia, A. fib, recent CVA, and CAD presenting for consultation of anemia.  Patient presents for consultation of anemia due to consideration of anticoagulation initiation in the setting of recent embolic CVA.  Patient was seen in the office in 10/2019 for IDA.  At that time, decision was made to defer EGD and colonoscopy.  Patient was on 325 mg aspirin at the time, as well as 40 mg Protonix daily, and it was felt that she was experiencing NSAID related gastritis which led to the bleeding.  Patient's Protonix was increased to 40 mg twice daily dosing.  Hemoglobin has improved from 8.8 on 09/08/2019 to 11.6 as of 03/23/2020.  Hemoccult positive on 3 out of 6 cards.  Patient denies any gastrointestinal complaints.  Denies abdominal pain, nausea, vomiting, hematemesis, dysphagia, GERD, changes in appetite, early satiety, unexplained weight loss.    States her stools have been chronically black since starting iron.  Stools are formed and she occasionally experiences constipation.  Denies any diarrhea or hematochezia.  Patient had a colonoscopy in 2002 which was reportedly normal.  She had a barium enema in 10/2014 which was negative for polyps or tumors.   Past Medical History:  Diagnosis Date  . Abnormal finding on thyroid function test    per old records: T3 low, free T4 elevated, TSH normal.  [place] thyroid made her feel worse and T4 went up so med d/c'd and liothyronine 5 mcg tried but also made pt feel worse.  Endo (Dr. Loanne Drilling felt like Biotin was causing the abnormal TFT's).  Repeat TFTs OFF BIOTIN x 2 wks were NORMAL.  NO FURTHER THYROID TESTING NEEDED.  Marland Kitchen Arthritis    "think it's osteo; got some in my hands"  . Atrial fibrillation ([place]) 02/10/2020   Dr. Roland Rack,  novant cards-[place],ASA and BB (stopped her ARB)-in sinus-monitor planned--?done  . Bifascicular block   . CAD (coronary artery disease)    Cath 2009  Occluded LAD, 40-50% RCA and circ managed medically.  Repeat 06/22/15 no change.  . Chronic constipation    slow transit.  Barium enema to eval for colonic stricture 10/2014 was NORMAL  . Cognitive dysfunction    short term memory loss--takes ginko biloba  . COPD (chronic obstructive pulmonary disease) ([place])    Changes noted on CXR 2017  . GERD (gastroesophageal reflux disease)    (also LPR) Schatzki's ring, s/p dil '97; food impaction 08/2010, on PPI therapy since w/out further sx so no dil performed  . History of herpes zoster 06/2015   right side of chest; presented with chest pain mimicking [place]--cardiac eval showed stable CAD compared to 2009.  Marland Kitchen Hyperkalemia 06/2017   suspected due to increase of losartan from 25mg  bid to 50mg  bid.  Dose lowered back to 25 mg bid K normalized.  After increase again to 50 qAM and 25 qPM potassium 4.8 so I did not increase dose any further (07/20/17).  . Hyperlipidemia   . Hypertension   . Hyponatremia 10/03/2011   Na 131 on labs 02/2015 at [facility].  Baseline Na 132.  . Iron deficiency anemia 11/30/15; 09/2019   Hemoccults neg x 3 12/2015.  ?Mild malabsorption?  09/2019 Hb drop to 8.5, iron low, hemoccult +.  Oral iron started->Dr. Buccini eval->heme neg in his office->obs/follow  Hb on oral Fe; order BE if Hb not responding or having ongoing heme+ stool.  . Mixed stress and urge urinary incontinence   . RBBB with left anterior fascicular block    bifascicular block  . Recurrent UTI    Bactrim prophyl, then changed to cefdinir (?med rxn?).  Cipro qd as of 08/2018.  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 Thrombocytopenia ([place]) 02/2013   Plts 114K  . Uterine cancer ([place]) 2000  . Xerostomia 2017/2018    Age related glandular atrophy + med effect (oxybutynin and gabapentin).  ANA and sjogren's panel NEG 06/2017.    Past Surgical History:  Procedure Laterality Date  . ABDOMINAL HYSTERECTOMY  2000  . APPENDECTOMY  2000  . CARDIAC CATHETERIZATION N/A 06/22/2015   Stable single vessel CAD, EF normal.  Procedure: Left Heart Cath and Coronary Angiography;  Surgeon: Leonie Man, MD;  Location: [place] [facility];  Service: Cardiovascular;  Laterality: N/A;  . CATARACT EXTRACTION W/ INTRAOCULAR LENS  IMPLANT, BILATERAL  1990's  . [place]; 1960  . COLONOSCOPY  04/2001   BE neg 10/2014  . DILATION AND CURETTAGE OF UTERUS    . ESOPHAGOGASTRODUODENOSCOPY  1997   for dysphagia->esoph dilatation done  . HERNIA REPAIR     abdominal "twice"  . LEFT HEART CATHETERIZATION WITH CORONARY ANGIOGRAM N/A 10/04/2011   Procedure: LEFT HEART CATHETERIZATION WITH CORONARY ANGIOGRAM;  Surgeon: Jacolyn Reedy, MD;  Location: [facility] CATH LAB;  Service: Cardiovascular;  Laterality: N/A;  . REPAIR KNEE LIGAMENT  ~ 2008   left  . TONSILLECTOMY     as a child  . TRANSTHORACIC ECHOCARDIOGRAM  05/2013   EF 60-65%, trivial aortic regurg, no wall motion abnorm    Prior to Admission medications   Medication Sig Start Date End Date Taking? Authorizing Provider  ASPIRIN LOW DOSE 81 MG EC tablet Take 81 mg by mouth daily. 02/13/20  Yes [provider]  atenolol (TENORMIN) 25 MG tablet Take 25 mg by mouth daily. 03/11/20  Yes [provider]  cevimeline (EVOXAC) 30 MG capsule TAKE 1 CAPSULE 3 TIMES A DAY FOR TREATMENT OF CHRONIC DRY MOUTH Patient taking differently: Take 30 mg by mouth in the morning and at bedtime. TAKE 1 CAPSULE 3 TIMES A DAY FOR TREATMENT OF CHRONIC DRY MOUTH 09/24/19  Yes McGowen, Adrian Blackwater, MD  Cholecalciferol (VITAMIN D3) 1000 UNITS CAPS Take 1,000 Units by mouth daily.    Yes [provider]  ciprofloxacin (CIPRO) 250 MG tablet TAKE 1 TABLET BY MOUTH EVERY DAY FOR  BLADDER INFECTION PROPHYLAXIS. 09/24/19  Yes McGowen, Adrian Blackwater, MD  FERROUS SULFATE PO Take 1 tablet by mouth daily.    Yes [provider]  fluticasone (FLONASE) 50 MCG/ACT nasal spray SPRAY 2 SPRAYS INTO EACH NOSTRIL EVERY DAY Patient taking differently: Place 1 spray into both nostrils daily as needed for allergies.  06/09/19  Yes McGowen, Adrian Blackwater, MD  nitroGLYCERIN (NITROSTAT) 0.4 MG SL tablet Place 1 tablet (0.4 mg total) under the tongue every 5 (five) minutes as needed for chest pain. 06/23/15  Yes Jacolyn Reedy, MD  Omega-3 Fatty Acids (FISH OIL) 1000 MG CAPS Take 1 capsule by mouth daily.   Yes [provider]  oxybutynin (DITROPAN) 5 MG tablet 1 tab po every other day Patient taking differently: Take 5 mg by  mouth every other day. 1 tab po every other day 08/18/19  Yes McGowen, Adrian Blackwater, MD  pantoprazole (PROTONIX) 40 MG tablet Take 1 tablet (40 mg total) by mouth daily. 09/16/19  Yes McGowen, Adrian Blackwater, MD  losartan (COZAAR) 50 MG tablet TAKE 1/2 TABLET (25 MG TOTAL) BY MOUTH 2 (TWO) TIMES DAILY. Patient not taking: Reported on 03/23/2020 01/29/20   Tammi Sou, MD  polyethylene glycol powder (GLYCOLAX/MIRALAX) powder TAKE 17 G BY MOUTH 2 (TWO) TIMES DAILY. Patient not taking: Reported on 03/23/2020 01/01/17   Tammi Sou, MD    Scheduled Meds: .  stroke: mapping our early stages of recovery book   Does not apply Once  . apixaban  2.5 mg Oral BID  . atorvastatin  40 mg Oral Daily  . cevimeline  30 mg Oral TID  . cholecalciferol  1,000 Units Oral Daily  . ciprofloxacin  250 mg Oral Q breakfast  . ferrous sulfate  325 mg Oral Q breakfast  . influenza vaccine adjuvanted  0.5 mL Intramuscular Tomorrow-1000  . metoprolol tartrate  25 mg Oral BID  . omega-3 acid ethyl esters  1 g Oral Daily  . oxybutynin  5 mg Oral QODAY  . pantoprazole  40 mg Oral Daily   Continuous Infusions: . sodium chloride 10 mL/hr at 03/24/20 1536   PRN Meds:.[DISCONTINUED]  acetaminophen **OR** acetaminophen (TYLENOL) oral liquid 160 mg/5 mL **OR** acetaminophen, fluticasone, senna-docusate  Allergies as of 03/23/2020 - Review Complete 03/23/2020  Allergen Reaction Noted  . Cefdinir Hives 01/09/2018  . Macrobid [nitrofurantoin macrocrystal] Other (See Comments) 10/03/2011    Family History  Problem Relation Age of Onset  . Arthritis Mother   . AAA (abdominal aortic aneurysm) Mother   . Diabetes Sister   . CVA Father   . Rheum arthritis Sister   . Cancer Neg Hx   . Heart disease Neg Hx   . Thyroid disease Neg Hx     Social History   Socioeconomic History  . Marital status: Widowed    Spouse name: Not on file  . Number of children: Not on file  . Years of education: Not on file  . Highest education level: Not on file  Occupational History  . Not on file  Tobacco Use  . Smoking status: Never Smoker  . Smokeless tobacco: Never Used  Vaping Use  . Vaping Use: Never used  Substance and Sexual Activity  . Alcohol use: No  . Drug use: No  . Sexual activity: Not on file  Other Topics Concern  . Not on file  Social History Narrative   Widow, lives alone.  Three sons, one deceased.   Orig from [facility].     Educ: HS.   Occup: retired.  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   No T/A/Ds.   Social Determinants of Health   Financial Resource Strain:   . Difficulty of Paying Living Expenses: Not on file  Food Insecurity:   . Worried About Charity fundraiser in the Last Year: Not on file  . Ran Out of Food in the Last Year: Not on file  Transportation Needs:   . Lack of Transportation (Medical): Not on file  . Lack of Transportation (Non-Medical): Not on file  Physical Activity:   . Days of Exercise per Week: Not on file  . Minutes of Exercise per Session: Not on file  Stress:   . Feeling of Stress : Not on file  Social  Connections:   . Frequency of Communication with Friends and Family: Not on file   . Frequency of Social Gatherings with Friends and Family: Not on file  . Attends Religious Services: Not on file  . Active Member of Clubs or Organizations: Not on file  . Attends Archivist Meetings: Not on file  . Marital Status: Not on file  Intimate Partner Violence:   . Fear of Current or Ex-Partner: Not on file  . Emotionally Abused: Not on file  . Physically Abused: Not on file  . Sexually Abused: Not on file    Review of Systems: Review of Systems  Constitutional: Negative for chills, fever, malaise/fatigue and weight loss.  HENT: Negative for hearing loss and tinnitus.   Eyes: Negative for pain and redness.  Respiratory: Negative for cough and shortness of breath.   Cardiovascular: Negative for chest pain and palpitations.  Gastrointestinal: Negative for abdominal pain, blood in stool, constipation, diarrhea, heartburn, melena, nausea and vomiting.  Genitourinary: Negative for flank pain and hematuria.  Musculoskeletal: Negative for falls and joint pain.  Skin: Negative for itching and rash.  Neurological: Negative for seizures and loss of consciousness.  Endo/Heme/Allergies: Negative for polydipsia. Does not bruise/bleed easily.  Psychiatric/Behavioral: Negative for substance abuse. The patient is not nervous/anxious.     Physical Exam: Vital signs: Vitals:   03/24/20 1135 03/24/20 1550  BP: (!) 177/85 (!) 153/89  Pulse:  63  Resp: 20 19  Temp: 98.1 F (36.7 C) 98.2 F (36.8 C)  SpO2:  100%     Physical Exam Vitals reviewed. Exam conducted with a chaperone present.  Constitutional:      General: She is not in acute distress.    Appearance: Normal appearance.  HENT:     Head: Normocephalic and atraumatic.     Nose: Nose normal.     Mouth/Throat:     Mouth: Mucous membranes are moist.     Pharynx: Oropharynx is clear.  Eyes:     General: No scleral icterus.    Extraocular Movements: Extraocular movements intact.     Conjunctiva/sclera:  Conjunctivae normal.  Cardiovascular:     Rate and Rhythm: Normal rate. Rhythm irregular.     Pulses: Normal pulses.     Heart sounds: Normal heart sounds.  Pulmonary:     Effort: Pulmonary effort is normal. No respiratory distress.     Breath sounds: Normal breath sounds.  Abdominal:     General: Bowel sounds are normal. There is no distension.     Palpations: Abdomen is soft. There is no mass.     Tenderness: There is no abdominal tenderness. There is no guarding or rebound.     Hernia: No hernia is present.  Genitourinary:    Rectum: External hemorrhoid (x1) present. No mass or tenderness. Abnormal anal tone.     Comments: Brown stool in rectal vault; chaperone (NT) present Musculoskeletal:        General: No swelling or tenderness.     Cervical back: Normal range of motion and neck supple.  Skin:    General: Skin is warm and dry.  Neurological:     General: No focal deficit present.     Mental Status: She is alert and oriented to person, place, and time.  Psychiatric:        Mood and Affect: Mood normal.        Behavior: Behavior normal. Behavior is cooperative.     GI:  Lab Results: Recent Labs  03/23/20 1600 03/23/20 1615  WBC 7.1  --   HGB 11.8* 12.6  HCT 36.6 37.0  PLT 158  --    BMET Recent Labs    03/23/20 1600 03/23/20 1615  NA 132* 133*  K 3.9 3.9  CL 98 97*  CO2 26  --   GLUCOSE 102* 101*  BUN 23 26*  CREATININE 0.81 0.70  CALCIUM 8.6*  --    LFT Recent Labs    03/23/20 1600  PROT 6.4*  ALBUMIN 3.4*  AST 21  ALT 13  ALKPHOS 47  BILITOT 0.9   PT/INR Recent Labs    03/23/20 1600  LABPROT 14.3  INR 1.2     Studies/Results: CT ANGIO HEAD W OR WO CONTRAST  Result Date: 03/23/2020 CLINICAL DATA:  Code stroke presentation. Specific defect not listed. EXAM: CT ANGIOGRAPHY HEAD AND NECK TECHNIQUE: Multidetector CT imaging of the head and neck was performed using the standard protocol during bolus administration of intravenous  contrast. Multiplanar CT image reconstructions and MIPs were obtained to evaluate the vascular anatomy. Carotid stenosis measurements (when applicable) are obtained utilizing NASCET criteria, using the distal internal carotid diameter as the denominator. CONTRAST:  14mL OMNIPAQUE IOHEXOL 350 MG/ML SOLN COMPARISON:  Head CT earlier same day. Previous CT angiography 05/29/2013. FINDINGS: CTA NECK FINDINGS Aortic arch: Aortic atherosclerosis. No aneurysm or dissection. Branching pattern is normal without origin stenosis. Right carotid system: Common carotid artery widely patent to the bifurcation. Mild atherosclerotic plaque at the carotid bifurcation but no stenosis. Cervical ICA widely patent. Left carotid system: Common carotid artery widely patent to the bifurcation. Calcified plaque at the carotid bifurcation without stenosis. Cervical ICA widely patent. Vertebral arteries: Both vertebral artery origins are widely patent. Both vertebral arteries widely patent through the cervical region to the foramen magnum. Skeleton: Ordinary cervical spondylosis. Other neck: No mass or lymphadenopathy. Upper chest: Scarring and interstitial prominence at the lung apices. Small effusions layering dependently. Review of the MIP images confirms the above findings CTA HEAD FINDINGS Anterior circulation: Both internal carotid arteries are widely patent through the skull base and carotid siphon regions. Ordinary siphon atherosclerotic calcification but no stenosis greater than 30%. The anterior and middle cerebral vessels are patent. No large or medium vessel occlusion is visible. No correctable proximal stenosis. Posterior circulation: Both vertebral arteries widely patent through the foramen magnum. Mild calcified plaque at both vertebral artery V4 segments but without stenosis greater than 30%. Both vertebral arteries reach the basilar. No basilar stenosis. Superior cerebellar and posterior cerebral arteries are patent. Venous  sinuses: Patent and normal. Anatomic variants: None significant. Review of the MIP images confirms the above findings IMPRESSION: 1. No large or medium vessel occlusion. 2. Mild atherosclerotic disease at both carotid bifurcations but without stenosis. 3. These results were communicated to Xu at [place] 10/19/2021by text page via the [facility] messaging system. Aortic Atherosclerosis (ICD10-I70.0). Electronically Signed   By: Nelson Chimes M.D.   On: 03/23/2020 18:55   CT ANGIO NECK W OR WO CONTRAST  Result Date: 03/23/2020 CLINICAL DATA:  Code stroke presentation. Specific defect not listed. EXAM: CT ANGIOGRAPHY HEAD AND NECK TECHNIQUE: Multidetector CT imaging of the head and neck was performed using the standard protocol during bolus administration of intravenous contrast. Multiplanar CT image reconstructions and MIPs were obtained to evaluate the vascular anatomy. Carotid stenosis measurements (when applicable) are obtained utilizing NASCET criteria, using the distal internal carotid diameter as the denominator. CONTRAST:  22mL OMNIPAQUE IOHEXOL 350 MG/ML SOLN COMPARISON:  Head CT earlier same day. Previous CT angiography 05/29/2013. FINDINGS: CTA NECK FINDINGS Aortic arch: Aortic atherosclerosis. No aneurysm or dissection. Branching pattern is normal without origin stenosis. Right carotid system: Common carotid artery widely patent to the bifurcation. Mild atherosclerotic plaque at the carotid bifurcation but no stenosis. Cervical ICA widely patent. Left carotid system: Common carotid artery widely patent to the bifurcation. Calcified plaque at the carotid bifurcation without stenosis. Cervical ICA widely patent. Vertebral arteries: Both vertebral artery origins are widely patent. Both vertebral arteries widely patent through the cervical region to the foramen magnum. Skeleton: Ordinary cervical spondylosis. Other neck: No mass or lymphadenopathy. Upper chest: Scarring and interstitial prominence at the lung  apices. Small effusions layering dependently. Review of the MIP images confirms the above findings CTA HEAD FINDINGS Anterior circulation: Both internal carotid arteries are widely patent through the skull base and carotid siphon regions. Ordinary siphon atherosclerotic calcification but no stenosis greater than 30%. The anterior and middle cerebral vessels are patent. No large or medium vessel occlusion is visible. No correctable proximal stenosis. Posterior circulation: Both vertebral arteries widely patent through the foramen magnum. Mild calcified plaque at both vertebral artery V4 segments but without stenosis greater than 30%. Both vertebral arteries reach the basilar. No basilar stenosis. Superior cerebellar and posterior cerebral arteries are patent. Venous sinuses: Patent and normal. Anatomic variants: None significant. Review of the MIP images confirms the above findings IMPRESSION: 1. No large or medium vessel occlusion. 2. Mild atherosclerotic disease at both carotid bifurcations but without stenosis. 3. These results were communicated to Xu at [place] 10/19/2021by text page via the [facility] messaging system. Aortic Atherosclerosis (ICD10-I70.0). Electronically Signed   By: Nelson Chimes M.D.   On: 03/23/2020 18:55   MR BRAIN WO CONTRAST  Result Date: 03/23/2020 CLINICAL DATA:  86 year old female code stroke presentation today. Left hand weakness. EXAM: MRI HEAD WITHOUT CONTRAST TECHNIQUE: Multiplanar, multiecho pulse sequences of the brain and surrounding structures were obtained without intravenous contrast. COMPARISON:  CT head, CTA head and neck earlier today. Brain MRI 05/30/2016, 05/30/2013. FINDINGS: The examination had to be discontinued prior to completion due to patient agitation, inability to continue. Axial and coronal DWI plus motion limited axial FLAIR imaging only was obtained. Diffusion imaging demonstrates a confluent 15 mm area of restricted diffusion at the right superior motor  strip corresponding to left hand representation area (series 3, image 40). Mild if any associated FLAIR hyperintensity. No mass effect. Punctate additional focus of restricted diffusion in the right occipital lobe cortex near the occipital pole (series 3, image 20). And questionable additional punctate area of cortical restricted diffusion in the left high superior frontal gyrus (image 43). No intracranial mass effect. No ventriculomegaly. Chronic patchy and confluent bilateral cerebral white matter FLAIR hyperintensity is again evident. Mild FLAIR hyperintensity in the pons. IMPRESSION: 1. Acute 15 mm Infarct at the right motor strip, left hand representation area. 2. One and possibly two additional punctate foci of acute ischemia in the right PCA, left MCA areas suggests embolic infarcts. Query atrial fibrillation. Alternatively, synchronous small vessel disease is possible. 3. Truncated exam. No evidence of acute hemorrhage. No intracranial mass effect. Electronically Signed   By: Genevie Ann M.D.   On: 03/23/2020 19:11   DG Chest Portable 1 View  Result Date: 03/23/2020 CLINICAL DATA:  Fever and confusion EXAM: PORTABLE CHEST 1 VIEW COMPARISON:  06/21/2015 FINDINGS: Cardiomegaly. Aortic atherosclerosis and tortuosity. Poor inspiration. Allowing for that, there are probably chronic abnormal interstitial markings without  any focal consolidation, collapse or effusion. IMPRESSION: Poor inspiration. Cardiomegaly. Aortic atherosclerosis. Chronic interstitial lung markings. No active process evident. Electronically Signed   By: Nelson Chimes M.D.   On: 03/23/2020 16:07   CT HEAD CODE STROKE WO CONTRAST  Result Date: 03/23/2020 CLINICAL DATA:  Code stroke. EXAM: CT HEAD WITHOUT CONTRAST TECHNIQUE: Contiguous axial images were obtained from the base of the skull through the vertex without intravenous contrast. COMPARISON:  2019 FINDINGS: Brain: No acute intracranial hemorrhage. Possible small area of cortical loss  of gray-white differentiation along the right precentral gyrus near the hand motor region. May reflect volume averaging with adjacent sulcus. Chronic small vessel infarct of the right lentiform nucleus. Additional patchy and confluent areas of hypoattenuation in the supratentorial white matter nonspecific but probably reflect moderate chronic microvascular ischemic changes. Prominence of the ventricles and sulci reflects generalized parenchymal volume loss. No extra-axial collection. Vascular: No hyperdense vessel. There is intracranial atherosclerotic calcification at the skull base. Skull: Unremarkable. Sinuses/Orbits: No significant opacification. No acute orbital finding. Other: Mastoid air cells are clear. ASPECTS [facility] Stroke Program Early CT Score) - Ganglionic level infarction (caudate, lentiform nuclei, internal capsule, insula, M1-M3 cortex): 7 - Supraganglionic infarction (M4-M6 cortex): 2 Total score (0-10 with 10 being normal): 9 IMPRESSION: No acute intracranial hemorrhage. Possible small acute cortical infarct involving the right precentral gyrus. Chronic/nonemergent findings detailed above. These results were called by telephone at the time of interpretation on 03/23/2020 at 4:31 pm to provider Dr. Erlinda Hong, Who verbally acknowledged these results. Electronically Signed   By: Macy Mis M.D.   On: 03/23/2020 16:45    Impression: Iron deficiency anemia: Improved on Protonix 40 mg twice daily and iron supplementation -Hemoglobin 11.8 as of 03/23/2020 as compared to 8.8 as of 09/2019 -Normal iron panel and ferritin as of 12/2019, whereas patient had low ferritin, iron, and iron saturation in 09/2019 -Hemoccult positive today 03/24/2020  A. Fib  Recent embolic CVA  Plan: Discussed case with Dr. Cristina Gong.  Due to patient's age and comorbidities (including recent CVA), patient is higher risk for endoscopic/colonoscopic evaluation.  Due to improvement in iron deficiency anemia on Protonix and  iron, as well as lack of frank melena or hematochezia, OK from a GI standpoint to proceed with anticoagulation as the benefits of anticoagulation in the setting of A. Fib with recent CVA outweigh the risks.  Continue to monitor H&H once anticoagulation is initiated.  Continue Protonix 40 mg PO twice daily.   LOS: 1 day   Salley Slaughter  PA-C 03/24/2020, 5:37 PM  Contact #  [phone]
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
## 2020-03-24 NOTE — Progress Notes (Signed)
Inpatient Rehab Admissions Coordinator Note:   Per therapy recommendations, pt was screened for CIR candidacy by Shann Medal, PT, DPT.  At this time we are recommending a CIR consult and I will place an order per our protocol.  Please contact me with questions.   Shann Medal, PT, DPT [phone] 03/24/20 2:54 PM

## 2020-03-24 NOTE — Progress Notes (Signed)
PROGRESS NOTE        PATIENT DETAILS Name: Sabrina Alvarez Age: 86 y.o. Sex: female Date of Birth: 05/01/1928 Admit Date: 03/23/2020 Admitting Physician Lequita Halt, MD GUR:[id number], Adrian Blackwater, MD  Brief Narrative: Patient is a 86 y.o. female who was recently diagnosed with atrial fibrillation presented with left-sided weakness-found to have acute CVA.  See below for further details.  Significant events: 10/19>> admit to [facility] for left-sided weakness-MRI brain positive for CVA.  Significant studies: 10/19>> MRI brain: Acute infarct in Rt motor strip, 2 additional punctate small infarcts in the Rt PCA, Lt MCA areas. 10/19>> CTA head and neck: No large or medium vessel occlusion 10/20>> Echo pending 10/20>> A1c pending 10/20>> LDL: 111  Antimicrobial therapy: None  Microbiology data: 10/19>> blood culture: No growth  Procedures : None  Consults: Neurology  DVT Prophylaxis : apixaban (ELIQUIS) tablet 2.5 mg Start: 03/24/20 2200 apixaban (ELIQUIS) tablet 2.5 mg    Subjective: Significant improvement in left arm weakness compared to yesterday.  No other complaints.  Assessment/Plan: Acute CVA: Embolic etiology-in the setting of A. fib.  Left arm weakness is remarkably improved.  Eliquis started-see discussion below.  Await further work-up including echo/A1c-await rehab services evaluation as well.  Chronic iron deficiency anemia: Discussed with Dr. Belinda Block MD-Dr. Buccini has seen the patient in the past in the outpatient setting-known history of iron deficiency anemia-FOBT in April 2021 --prior FOBT has been positive.  Hemoglobin was much lower a few months ago but with iron supplementation hemoglobin levels have improved.  No overt GI bleeding evident.  Patient in the past has not been keen on pursuing endoscopic evaluation.  Dr. Janeece Riggers recommends starting anticoagulation-and watchful observation-does not recommend pursuing endoscopic evaluation  at this point.  Atrial fibrillation: Continues to have rate controlled atrial fibrillation-on beta-blocker-echo pending-starting Eliquis-see discussion above.  HLD: LDL above goal-started on Lipitor.  History of frequent UTI: Appears to be on ciprofloxacin suppressive treatment.  Diet: Diet Order            Diet Heart Room service appropriate? Yes; Fluid consistency: Thin  Diet effective now                  Code Status:  DNR  Family Communication: Son at bedside  Disposition Plan: Status is: Inpatient  Remains inpatient appropriate because:Inpatient level of care appropriate due to severity of illness   Dispo: The patient is from: Home              Anticipated d/c is to: CIR              Anticipated d/c date is: 2 days              Patient currently is not medically stable to d/c.   Barriers to Discharge: Acute CVA-work-up in progress  Antimicrobial agents: Anti-infectives (From admission, onward)   Start     Dose/Rate Route Frequency Ordered Stop   03/24/20 0800  ciprofloxacin (CIPRO) tablet 250 mg        250 mg Oral Daily with breakfast 03/23/20 1821         Time spent: 25- minutes-Greater than 50% of this time was spent in counseling, explanation of diagnosis, planning of further management, and coordination of care.  MEDICATIONS: Scheduled Meds: .  stroke: mapping our early stages of recovery book   Does  not apply Once  . apixaban  2.5 mg Oral BID  . atorvastatin  40 mg Oral Daily  . cevimeline  30 mg Oral TID  . cholecalciferol  1,000 Units Oral Daily  . ciprofloxacin  250 mg Oral Q breakfast  . ferrous sulfate  325 mg Oral Q breakfast  . influenza vaccine adjuvanted  0.5 mL Intramuscular Tomorrow-1000  . metoprolol tartrate  25 mg Oral BID  . omega-3 acid ethyl esters  1 g Oral Daily  . oxybutynin  5 mg Oral QODAY  . pantoprazole  40 mg Oral Daily   Continuous Infusions: . sodium chloride 100 mL/hr at 03/24/20 1048   PRN Meds:.[DISCONTINUED]  acetaminophen **OR** acetaminophen (TYLENOL) oral liquid 160 mg/5 mL **OR** acetaminophen, fluticasone, senna-docusate   PHYSICAL EXAM: Vital signs: Vitals:   03/24/20 0402 03/24/20 0615 03/24/20 0804 03/24/20 1135  BP: (!) 145/75 (!) 143/87 (!) 159/72 (!) 177/85  Pulse: 67 62 60   Resp: 14 16 19 20   Temp: 98.5 F (36.9 C) 98.7 F (37.1 C) 98.5 F (36.9 C) 98.1 F (36.7 C)  TempSrc: Oral Oral Oral Oral  SpO2: 94% 93% 95%   Weight:      Height:       Filed Weights   03/23/20 1600 03/23/20 2237  Weight: 57.8 kg 53.5 kg   Body mass index is 20.25 kg/m.   Gen Exam:Alert awake-not in any distress HEENT:atraumatic, normocephalic Chest: B/L clear to auscultation anteriorly CVS:S1S2 regular Abdomen:soft non tender, non distended Extremities:no edema Neurology: Very minimal left upper extremity weakness. Skin: no rash  I have personally reviewed following labs and imaging studies  LABORATORY DATA: CBC: Recent Labs  Lab 03/23/20 1600 03/23/20 1615  WBC 7.1  --   NEUTROABS 5.0  --   HGB 11.8* 12.6  HCT 36.6 37.0  MCV 87.6  --   PLT 158  --     Basic Metabolic Panel: Recent Labs  Lab 03/23/20 1600 03/23/20 1615  NA 132* 133*  K 3.9 3.9  CL 98 97*  CO2 26  --   GLUCOSE 102* 101*  BUN 23 26*  CREATININE 0.81 0.70  CALCIUM 8.6*  --     GFR: Estimated Creatinine Clearance: 39.5 mL/min (by C-G formula based on SCr of 0.7 mg/dL).  Liver Function Tests: Recent Labs  Lab 03/23/20 1600  AST 21  ALT 13  ALKPHOS 47  BILITOT 0.9  PROT 6.4*  ALBUMIN 3.4*   No results for input(s): LIPASE, AMYLASE in the last 168 hours. No results for input(s): AMMONIA in the last 168 hours.  Coagulation Profile: Recent Labs  Lab 03/23/20 1600  INR 1.2    Cardiac Enzymes: Recent Labs  Lab 03/23/20 1600  CKTOTAL 70    BNP (last 3 results) No results for input(s): PROBNP in the last 8760 hours.  Lipid Profile: Recent Labs    03/24/20 0603  CHOL 152  HDL  33*  LDLCALC 111*  TRIG 40  CHOLHDL 4.6    Thyroid Function Tests: No results for input(s): TSH, T4TOTAL, FREET4, T3FREE, THYROIDAB in the last 72 hours.  Anemia Panel: No results for input(s): VITAMINB12, FOLATE, FERRITIN, TIBC, IRON, RETICCTPCT in the last 72 hours.  Urine analysis:    Component Value Date/Time   COLORURINE YELLOW 03/23/2020 1859   APPEARANCEUR CLEAR 03/23/2020 1859   LABSPEC 1.023 03/23/2020 1859   PHURINE 6.0 03/23/2020 1859   GLUCOSEU NEGATIVE 03/23/2020 1859   GLUCOSEU NEGATIVE 08/23/2016 1613   HGBUR NEGATIVE 03/23/2020  [place] 03/23/2020 1859   BILIRUBINUR negative 12/15/2019 1522   KETONESUR 20 (A) 03/23/2020 1859   PROTEINUR NEGATIVE 03/23/2020 1859   UROBILINOGEN 0.2 12/15/2019 1522   UROBILINOGEN 0.2 08/23/2016 1613   NITRITE POSITIVE (A) 03/23/2020 1859   LEUKOCYTESUR MODERATE (A) 03/23/2020 1859    Sepsis Labs: Lactic Acid, Venous    Component Value Date/Time   LATICACIDVEN 1.2 03/23/2020 1600    MICROBIOLOGY: Recent Results (from the past 240 hour(s))  Blood culture (routine x 2)     Status: None (Preliminary result)   Collection Time: 03/23/20  4:40 PM   Specimen: BLOOD LEFT ARM  Result Value Ref Range Status   Specimen Description BLOOD LEFT ARM  Final   Special Requests   Final    BOTTLES DRAWN AEROBIC AND ANAEROBIC Blood Culture results may not be optimal due to an inadequate volume of blood received in culture bottles   Culture   Final    NO GROWTH < 24 HOURS Performed at [facility], [place] [address] [place], [place] [address]    Report Status PENDING  Incomplete  Respiratory Panel by RT PCR (Flu A&B, Covid) - Nasopharyngeal Swab     Status: None   Collection Time: 03/23/20  4:41 PM   Specimen: Nasopharyngeal Swab  Result Value Ref Range Status   SARS Coronavirus 2 by RT PCR NEGATIVE NEGATIVE Final    Comment: (NOTE) SARS-CoV-2 target nucleic acids are NOT DETECTED.  The SARS-CoV-2 RNA is  generally detectable in upper respiratoy specimens during the acute phase of infection. The lowest concentration of SARS-CoV-2 viral copies this assay can detect is 131 copies/mL. A negative result does not preclude SARS-Cov-2 infection and should not be used as the sole basis for treatment or other patient management decisions. A negative result may occur with  improper specimen collection/handling, submission of specimen other than nasopharyngeal swab, presence of viral mutation(s) within the areas targeted by this assay, and inadequate number of viral copies (<131 copies/mL). A negative result must be combined with clinical observations, patient history, and epidemiological information. The expected result is Negative.  Fact Sheet for Patients:  PinkCheek.be  Fact Sheet for Healthcare Providers:  GravelBags.it  This test is no t yet approved or cleared by the [place] FDA and  has been authorized for detection and/or diagnosis of SARS-CoV-2 by FDA under an Emergency Use Authorization (EUA). This EUA will remain  in effect (meaning this test can be used) for the duration of the COVID-19 declaration under Section 564(b)(1) of the Act, 21 U.S.C. section 360bbb-3(b)(1), unless the authorization is terminated or revoked sooner.     Influenza A by PCR NEGATIVE NEGATIVE Final   Influenza B by PCR NEGATIVE NEGATIVE Final    Comment: (NOTE) The Xpert Xpress SARS-CoV-2/FLU/RSV assay is intended as an aid in  the diagnosis of influenza from Nasopharyngeal swab specimens and  should not be used as a sole basis for treatment. Nasal washings and  aspirates are unacceptable for Xpert Xpress SARS-CoV-2/FLU/RSV  testing.  Fact Sheet for Patients: PinkCheek.be  Fact Sheet for Healthcare Providers: GravelBags.it  This test is not yet approved or cleared by the [place] FDA and  has been authorized for detection and/or diagnosis of SARS-CoV-2 by  FDA under an Emergency Use Authorization (EUA). This EUA will remain  in effect (meaning this test can be used) for the duration of the  Covid-19 declaration under Section 564(b)(1) of the Act, 21  U.S.C.  section 360bbb-3(b)(1), unless the authorization is  terminated or revoked. Performed at [facility], [place] [address], [place], [place] [address]   Blood culture (routine x 2)     Status: None (Preliminary result)   Collection Time: 03/23/20  4:43 PM   Specimen: BLOOD RIGHT ARM  Result Value Ref Range Status   Specimen Description BLOOD RIGHT ARM  Final   Special Requests   Final    BOTTLES DRAWN AEROBIC AND ANAEROBIC Blood Culture results may not be optimal due to an inadequate volume of blood received in culture bottles   Culture   Final    NO GROWTH < 24 HOURS Performed at [facility], [place] [address], [place], [place] [address]    Report Status PENDING  Incomplete    RADIOLOGY STUDIES/RESULTS: CT ANGIO HEAD W OR WO CONTRAST  Result Date: 03/23/2020 CLINICAL DATA:  Code stroke presentation. Specific defect not listed. EXAM: CT ANGIOGRAPHY HEAD AND NECK TECHNIQUE: Multidetector CT imaging of the head and neck was performed using the standard protocol during bolus administration of intravenous contrast. Multiplanar CT image reconstructions and MIPs were obtained to evaluate the vascular anatomy. Carotid stenosis measurements (when applicable) are obtained utilizing NASCET criteria, using the distal internal carotid diameter as the denominator. CONTRAST:  58mL OMNIPAQUE IOHEXOL 350 MG/ML SOLN COMPARISON:  Head CT earlier same day. Previous CT angiography 05/29/2013. FINDINGS: CTA NECK FINDINGS Aortic arch: Aortic atherosclerosis. No aneurysm or dissection. Branching pattern is normal without origin stenosis. Right carotid system: Common carotid artery widely patent to the bifurcation. Mild  atherosclerotic plaque at the carotid bifurcation but no stenosis. Cervical ICA widely patent. Left carotid system: Common carotid artery widely patent to the bifurcation. Calcified plaque at the carotid bifurcation without stenosis. Cervical ICA widely patent. Vertebral arteries: Both vertebral artery origins are widely patent. Both vertebral arteries widely patent through the cervical region to the foramen magnum. Skeleton: Ordinary cervical spondylosis. Other neck: No mass or lymphadenopathy. Upper chest: Scarring and interstitial prominence at the lung apices. Small effusions layering dependently. Review of the MIP images confirms the above findings CTA HEAD FINDINGS Anterior circulation: Both internal carotid arteries are widely patent through the skull base and carotid siphon regions. Ordinary siphon atherosclerotic calcification but no stenosis greater than 30%. The anterior and middle cerebral vessels are patent. No large or medium vessel occlusion is visible. No correctable proximal stenosis. Posterior circulation: Both vertebral arteries widely patent through the foramen magnum. Mild calcified plaque at both vertebral artery V4 segments but without stenosis greater than 30%. Both vertebral arteries reach the basilar. No basilar stenosis. Superior cerebellar and posterior cerebral arteries are patent. Venous sinuses: Patent and normal. Anatomic variants: None significant. Review of the MIP images confirms the above findings IMPRESSION: 1. No large or medium vessel occlusion. 2. Mild atherosclerotic disease at both carotid bifurcations but without stenosis. 3. These results were communicated to Xu at [place] 10/19/2021by text page via the [facility] messaging system. Aortic Atherosclerosis (ICD10-I70.0). Electronically Signed   By: Nelson Chimes M.D.   On: 03/23/2020 18:55   CT ANGIO NECK W OR WO CONTRAST  Result Date: 03/23/2020 CLINICAL DATA:  Code stroke presentation. Specific defect not listed. EXAM: CT  ANGIOGRAPHY HEAD AND NECK TECHNIQUE: Multidetector CT imaging of the head and neck was performed using the standard protocol during bolus administration of intravenous contrast. Multiplanar CT image reconstructions and MIPs were obtained to evaluate the vascular anatomy. Carotid stenosis measurements (when applicable) are obtained utilizing NASCET  criteria, using the distal internal carotid diameter as the denominator. CONTRAST:  78mL OMNIPAQUE IOHEXOL 350 MG/ML SOLN COMPARISON:  Head CT earlier same day. Previous CT angiography 05/29/2013. FINDINGS: CTA NECK FINDINGS Aortic arch: Aortic atherosclerosis. No aneurysm or dissection. Branching pattern is normal without origin stenosis. Right carotid system: Common carotid artery widely patent to the bifurcation. Mild atherosclerotic plaque at the carotid bifurcation but no stenosis. Cervical ICA widely patent. Left carotid system: Common carotid artery widely patent to the bifurcation. Calcified plaque at the carotid bifurcation without stenosis. Cervical ICA widely patent. Vertebral arteries: Both vertebral artery origins are widely patent. Both vertebral arteries widely patent through the cervical region to the foramen magnum. Skeleton: Ordinary cervical spondylosis. Other neck: No mass or lymphadenopathy. Upper chest: Scarring and interstitial prominence at the lung apices. Small effusions layering dependently. Review of the MIP images confirms the above findings CTA HEAD FINDINGS Anterior circulation: Both internal carotid arteries are widely patent through the skull base and carotid siphon regions. Ordinary siphon atherosclerotic calcification but no stenosis greater than 30%. The anterior and middle cerebral vessels are patent. No large or medium vessel occlusion is visible. No correctable proximal stenosis. Posterior circulation: Both vertebral arteries widely patent through the foramen magnum. Mild calcified plaque at both vertebral artery V4 segments but  without stenosis greater than 30%. Both vertebral arteries reach the basilar. No basilar stenosis. Superior cerebellar and posterior cerebral arteries are patent. Venous sinuses: Patent and normal. Anatomic variants: None significant. Review of the MIP images confirms the above findings IMPRESSION: 1. No large or medium vessel occlusion. 2. Mild atherosclerotic disease at both carotid bifurcations but without stenosis. 3. These results were communicated to Xu at [place] 10/19/2021by text page via the [facility] messaging system. Aortic Atherosclerosis (ICD10-I70.0). Electronically Signed   By: Nelson Chimes M.D.   On: 03/23/2020 18:55   MR BRAIN WO CONTRAST  Result Date: 03/23/2020 CLINICAL DATA:  86 year old female code stroke presentation today. Left hand weakness. EXAM: MRI HEAD WITHOUT CONTRAST TECHNIQUE: Multiplanar, multiecho pulse sequences of the brain and surrounding structures were obtained without intravenous contrast. COMPARISON:  CT head, CTA head and neck earlier today. Brain MRI 05/30/2016, 05/30/2013. FINDINGS: The examination had to be discontinued prior to completion due to patient agitation, inability to continue. Axial and coronal DWI plus motion limited axial FLAIR imaging only was obtained. Diffusion imaging demonstrates a confluent 15 mm area of restricted diffusion at the right superior motor strip corresponding to left hand representation area (series 3, image 40). Mild if any associated FLAIR hyperintensity. No mass effect. Punctate additional focus of restricted diffusion in the right occipital lobe cortex near the occipital pole (series 3, image 20). And questionable additional punctate area of cortical restricted diffusion in the left high superior frontal gyrus (image 43). No intracranial mass effect. No ventriculomegaly. Chronic patchy and confluent bilateral cerebral white matter FLAIR hyperintensity is again evident. Mild FLAIR hyperintensity in the pons. IMPRESSION: 1. Acute 15 mm  Infarct at the right motor strip, left hand representation area. 2. One and possibly two additional punctate foci of acute ischemia in the right PCA, left MCA areas suggests embolic infarcts. Query atrial fibrillation. Alternatively, synchronous small vessel disease is possible. 3. Truncated exam. No evidence of acute hemorrhage. No intracranial mass effect. Electronically Signed   By: Genevie Ann M.D.   On: 03/23/2020 19:11   DG Chest Portable 1 View  Result Date: 03/23/2020 CLINICAL DATA:  Fever and confusion EXAM: PORTABLE CHEST 1 VIEW COMPARISON:  06/21/2015 FINDINGS: Cardiomegaly. Aortic atherosclerosis and tortuosity. Poor inspiration. Allowing for that, there are probably chronic abnormal interstitial markings without any focal consolidation, collapse or effusion. IMPRESSION: Poor inspiration. Cardiomegaly. Aortic atherosclerosis. Chronic interstitial lung markings. No active process evident. Electronically Signed   By: Nelson Chimes M.D.   On: 03/23/2020 16:07   CT HEAD CODE STROKE WO CONTRAST  Result Date: 03/23/2020 CLINICAL DATA:  Code stroke. EXAM: CT HEAD WITHOUT CONTRAST TECHNIQUE: Contiguous axial images were obtained from the base of the skull through the vertex without intravenous contrast. COMPARISON:  2019 FINDINGS: Brain: No acute intracranial hemorrhage. Possible small area of cortical loss of gray-white differentiation along the right precentral gyrus near the hand motor region. May reflect volume averaging with adjacent sulcus. Chronic small vessel infarct of the right lentiform nucleus. Additional patchy and confluent areas of hypoattenuation in the supratentorial white matter nonspecific but probably reflect moderate chronic microvascular ischemic changes. Prominence of the ventricles and sulci reflects generalized parenchymal volume loss. No extra-axial collection. Vascular: No hyperdense vessel. There is intracranial atherosclerotic calcification at the skull base. Skull:  Unremarkable. Sinuses/Orbits: No significant opacification. No acute orbital finding. Other: Mastoid air cells are clear. ASPECTS [facility] Stroke Program Early CT Score) - Ganglionic level infarction (caudate, lentiform nuclei, internal capsule, insula, M1-M3 cortex): 7 - Supraganglionic infarction (M4-M6 cortex): 2 Total score (0-10 with 10 being normal): 9 IMPRESSION: No acute intracranial hemorrhage. Possible small acute cortical infarct involving the right precentral gyrus. Chronic/nonemergent findings detailed above. These results were called by telephone at the time of interpretation on 03/23/2020 at 4:31 pm to provider Dr. Erlinda Hong, Who verbally acknowledged these results. Electronically Signed   By: Macy Mis M.D.   On: 03/23/2020 16:45     LOS: 1 day   Oren Binet, MD  [facility]    To contact the attending provider between 7A-7P or the covering provider during after hours 7P-7A, please log into the web site [email or web address] and access using universal [place] password for that web site. If you do not have the password, please call the hospital operator.  03/24/2020, 1:31 PM

## 2020-03-24 NOTE — Evaluation (Signed)
Physical Therapy Evaluation Patient Details Name: Sabrina Alvarez MRN: [id number] DOB: Dec 12, 1927 Today's Date: 03/24/2020   History of Present Illness  86 y.o. female with medical history significant of recently diagnosed paroxysmal A. fib, chronic iron deficiency anemia on iron supplement, recurrent UTI on chronic antibiotic suppression, GERD, 3rd cranial nerve palsy, presented with new onset of left-sided weakness. CT head showed no acute abnormalities. 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  Clinical Impression  Pt presents to PT with deficits in functional mobility, gait, balance, endurance, strength, power, and with significant impairments in cognition and device management. Pt is very forgetful during session, disoriented to time and situation and unable to recall time and situation later during session. Pt asks 4 times during session if she has eaten breakfast yet. Pt also demonstrates very poor device management, frequently abandoning RW during transfers and ADLs. Pt with impaired balance and abandoning RW places her at a high falls risk at this time. Due to safety concerns and falls risk PT recommends pt discharge to CIR to aide in a return to a modI level of mobility.    Follow Up Recommendations CIR    Equipment Recommendations  3in1 (PT)    Recommendations for Other Services Rehab consult     Precautions / Restrictions Precautions Precautions: Fall Restrictions Weight Bearing Restrictions: No      Mobility  Bed Mobility Overal bed mobility: Needs Assistance Bed Mobility: Supine to Sit     Supine to sit: Min guard;HOB elevated          Transfers Overall transfer level: Needs assistance Equipment used: Rolling walker (2 wheeled);2 person hand held assist Transfers: Sit to/from Stand Sit to Stand: Min assist;+2 physical assistance         General transfer comment: minA x2 with hand hold,  posterior lean initially, improved with use of RW however pt requires minA for safety and DME management  Ambulation/Gait Ambulation/Gait assistance: Min assist Gait Distance (Feet): 15 Feet (15' x 2) Assistive device: Rolling walker (2 wheeled) Gait Pattern/deviations: Step-to pattern;Trunk flexed Gait velocity: reduced Gait velocity interpretation: <1.8 ft/sec, indicate of risk for recurrent falls General Gait Details: pt with short step to gait, increased trunk flexion and poor device management, repeatedly abandoning RW during ADLs or transfers  Stairs            Wheelchair Mobility    Modified Rankin (Stroke Patients Only) Modified Rankin (Stroke Patients Only) Pre-Morbid Rankin Score: Slight disability Modified Rankin: Moderately severe disability     Balance Overall balance assessment: Needs assistance Sitting-balance support: No upper extremity supported;Feet supported Sitting balance-Leahy Scale: Good Sitting balance - Comments: supervision   Standing balance support: Single extremity supported;Bilateral upper extremity supported Standing balance-Leahy Scale: Poor Standing balance comment: minA initially, progressing to minG with UE supoport of RW                             Pertinent Vitals/Pain Pain Assessment: No/denies pain    Home Living Family/patient expects to be discharged to:: Private residence Living Arrangements: Alone Available Help at Discharge: Family;Available 24 hours/day Type of Home: House Home Access: Stairs to enter Entrance Stairs-Rails: None Entrance Stairs-Number of Steps: 1   Home Equipment: Walker - 2 wheels;Cane - quad;Shower seat - built in      Prior Function Level of Independence: Independent  Comments: not driving     Hand Dominance   Dominant Hand: Right    Extremity/Trunk Assessment   Upper Extremity Assessment Upper Extremity Assessment: Defer to OT evaluation    Lower Extremity  Assessment Lower Extremity Assessment: Generalized weakness    Cervical / Trunk Assessment Cervical / Trunk Assessment: Normal  Communication   Communication: No difficulties  Cognition Arousal/Alertness: Awake/alert Behavior During Therapy: WFL for tasks assessed/performed Overall Cognitive Status: Impaired/Different from baseline Area of Impairment: Orientation;Attention;Memory;Following commands;Safety/judgement;Awareness;Problem solving                 Orientation Level: Disoriented to;Time;Situation Current Attention Level: Sustained Memory: Decreased recall of precautions;Decreased short-term memory Following Commands: Follows one step commands consistently Safety/Judgement: Decreased awareness of safety;Decreased awareness of deficits Awareness: Intellectual Problem Solving: Slow processing;Requires verbal cues;Difficulty sequencing        General Comments General comments (skin integrity, edema, etc.): VSS on RA    Exercises     Assessment/Plan    PT Assessment Patient needs continued PT services  PT Problem List Decreased strength;Decreased activity tolerance;Decreased balance;Decreased mobility;Decreased cognition;Decreased knowledge of use of DME;Decreased safety awareness;Decreased knowledge of precautions       PT Treatment Interventions DME instruction;Gait training;Stair training;Functional mobility training;Therapeutic activities;Therapeutic exercise;Balance training;Neuromuscular re-education;Cognitive remediation;Patient/family education    PT Goals (Current goals can be found in the Care Plan section)  Acute Rehab PT Goals Patient Stated Goal: To return to independence PT Goal Formulation: With patient/family Time For Goal Achievement: 04/07/20 Potential to Achieve Goals: Fair    Frequency Min 4X/week   Barriers to discharge        Co-evaluation PT/OT/SLP Co-Evaluation/Treatment: Yes Reason for Co-Treatment: Complexity of the patient's  impairments (multi-system involvement);Necessary to address cognition/behavior during functional activity           AM-PAC PT "6 Clicks" Mobility  Outcome Measure Help needed turning from your back to your side while in a flat bed without using bedrails?: None Help needed moving from lying on your back to sitting on the side of a flat bed without using bedrails?: None Help needed moving to and from a bed to a chair (including a wheelchair)?: A Little Help needed standing up from a chair using your arms (e.g., wheelchair or bedside chair)?: A Little Help needed to walk in hospital room?: A Little Help needed climbing 3-5 steps with a railing? : A Lot 6 Click Score: 19    End of Session Equipment Utilized During Treatment: Gait belt Activity Tolerance: Patient tolerated treatment well Patient left: in chair;with call bell/phone within reach;with chair alarm set;with family/visitor present Nurse Communication: Mobility status PT Visit Diagnosis: Unsteadiness on feet (R26.81);Other abnormalities of gait and mobility (R26.89);Other symptoms and signs involving the nervous system (R29.898)    Time: 3354-5625 PT Time Calculation (min) (ACUTE ONLY): 45 min   Charges:   PT Evaluation $PT Eval Moderate Complexity: 1 Mod PT Treatments $Therapeutic Activity: 8-22 mins        Zenaida Niece, PT, DPT Acute Rehabilitation Pager: [phone]   Zenaida Niece 03/24/2020, 12:50 PM

## 2020-03-24 NOTE — TOC Benefit Eligibility Note (Signed)
Transition of Care [facility]) Benefit Eligibility Note    Patient Details  Name: Sabrina Alvarez MRN: [id number] Date of Birth: 02/18/1928   Medication/Dose: Arne Cleveland  2.5 MG BID  Covered?: Yes  Tier: 3 Drug  Prescription Coverage Preferred Pharmacy: [facility]  Spoke with Person/Company/Phone Number:: [facility]  @ [employer] #  [phone]  Co-Pay: $47.00  Prior Approval: No  Deductible: Unmet       Memory Argue Phone Number: 03/24/2020, 1:27 PM

## 2020-03-24 NOTE — Discharge Instructions (Signed)

## 2020-03-24 NOTE — Progress Notes (Signed)
STROKE TEAM PROGRESS NOTE   INTERVAL HISTORY RN and patient son at bedside.  Patient sitting in chair, no acute distress.  Still in A. fib on telemetry, however rate has been controlled overnight.  MRI showed 3 small/punctate infarcts at right MCA and left MCA.  Apparently patient had history of anemia with guaiac positive, on iron pills, subsequent guaiac was negative.  Currently H&H stable.  On aspirin at home.  Given her A. fib with stroke, recommend anticoagulation.  Discussed with son regarding the benefit and risk of anticoagulation, he does agree to proceed.  OBJECTIVE Vitals:   03/24/20 0402 03/24/20 0615 03/24/20 0804 03/24/20 1135  BP: (!) 145/75 (!) 143/87 (!) 159/72 (!) 177/85  Pulse: 67 62 60   Resp: 14 16 19 20   Temp: 98.5 F (36.9 C) 98.7 F (37.1 C) 98.5 F (36.9 C) 98.1 F (36.7 C)  TempSrc: Oral Oral Oral Oral  SpO2: 94% 93% 95%   Weight:      Height:       CBC:  Recent Labs  Lab 03/23/20 1600 03/23/20 1615  WBC 7.1  --   NEUTROABS 5.0  --   HGB 11.8* 12.6  HCT 36.6 37.0  MCV 87.6  --   PLT 158  --    Basic Metabolic Panel:  Recent Labs  Lab 03/23/20 1600 03/23/20 1615  NA 132* 133*  K 3.9 3.9  CL 98 97*  CO2 26  --   GLUCOSE 102* 101*  BUN 23 26*  CREATININE 0.81 0.70  CALCIUM 8.6*  --    Lipid Panel:     Component Value Date/Time   CHOL 152 03/24/2020 0603   TRIG 40 03/24/2020 0603   HDL 33 (L) 03/24/2020 0603   CHOLHDL 4.6 03/24/2020 0603   VLDL 8 03/24/2020 0603   LDLCALC 111 (H) 03/24/2020 0603   HgbA1c:  Lab Results  Component Value Date   HGBA1C 5.8 (H) 05/30/2013   Urine Drug Screen: No results found for: LABOPIA, COCAINSCRNUR, LABBENZ, AMPHETMU, THCU, LABBARB  Alcohol Level     Component Value Date/Time   ETH <10 03/23/2020 1551    IMAGING  CT ANGIO HEAD W OR WO CONTRAST CT ANGIO NECK W OR WO CONTRAST 03/23/2020 IMPRESSION:  1. No large or medium vessel occlusion.  2. Mild atherosclerotic disease at both carotid  bifurcations but without stenosis.  MR BRAIN WO CONTRAST 03/23/2020 IMPRESSION:  1. Acute 15 mm Infarct at the right motor strip, left hand representation area.  2. One and possibly two additional punctate foci of acute ischemia in the right PCA, left MCA areas suggests embolic infarcts. Query atrial fibrillation. Alternatively, synchronous small vessel disease is possible.  3. Truncated exam. No evidence of acute hemorrhage. No intracranial mass effect.    DG Chest Portable 1 View 03/23/2020 IMPRESSION:  Poor inspiration. Cardiomegaly. Aortic atherosclerosis. Chronic interstitial lung markings. No active process evident.   CT HEAD CODE STROKE WO CONTRAST 03/23/2020 IMPRESSION:  No acute intracranial hemorrhage. Possible small acute cortical infarct involving the right precentral gyrus. Chronic/nonemergent findings detailed above.   ECG - atrial fibrillation - ventricular response 113 BPM (See cardiology reading for complete details)   PHYSICAL EXAM  Temp:  [97.5 F (36.4 C)-99.1 F (37.3 C)] 98.2 F (36.8 C) (10/20 1550) Pulse Rate:  [60-87] 63 (10/20 1550) Resp:  [14-24] 19 (10/20 1550) BP: (114-177)/(60-97) 153/89 (10/20 1550) SpO2:  [92 %-100 %] 100 % (10/20 1550) Weight:  [53.5 kg] 53.5 kg (10/19 2237)  General - well nourished, well developed, in no apparent distress.    Ophthalmologic - fundi not visualized due to noncooperation.    Cardiovascular - irregularly irregular heart rate and rhythm.  Mental Status -  Level of arousal and orientation to place, age and person were intact, not orientated to time. Language including expression, naming, repetition, comprehension, reading, and writing was assessed and found intact.  Cranial Nerves II - XII - II - Vision intact OU. III, IV, VI - Extraocular movements intact. V - Facial sensation intact bilaterally. VII - subtle left nasolabial fold flattening. VIII - Hearing & vestibular intact bilaterally. X - Palate  elevates symmetrically. XI - Chin turning & shoulder shrug intact bilaterally. XII - Tongue protrusion intact.  Motor Strength - The patient's strength was symmetrical except left upper extremity mild pronator drift was present.   Motor Tone & Bulk - Muscle tone was assessed at the neck and appendages and was normal.  Bulk was normal and fasciculations were absent.   Reflexes - The patient's reflexes were normal in all extremities and she had no pathological reflexes.  Sensory - Light touch, temperature/pinprick were assessed and were normal.    Coordination - The patient had normal movements in the right hand with no ataxia or dysmetria.  However, left finger-to-nose mild ataxic out of proportion to the weakness.  Tremor was absent.  Gait and Station - deferred   ASSESSMENT/PLAN Ms. VIDYA BAMFORD is a 86 y.o. female with history of hypertension, hyperlipidemia, COPD, thrombocytopenia, CAD, left 3rd nerve palsy in 2014, recent fall leading to diagnosis of A. fib/a flutter ([facility]) presented to ED for left facial droop and left arm weakness.  She did not receive IV t-PA due to late presentation (>4.5 hours from time of onset).  Stroke: Bilateral MCA small/punctate infarcts - embolic - likely from atrial fibrillation not on [facility]  Code Stroke CT Head - No acute intracranial hemorrhage. Possible small acute cortical infarct involving the right precentral gyrus. Chronic/nonemergent findings detailed above.   CTA H&N - No large or medium vessel occlusion. Mild atherosclerotic disease at both carotid bifurcations but without stenosis.  MRI head - Acute 15 mm Infarct at the right motor strip, left hand representation area. One and possibly two additional punctate foci of acute ischemia in the right PCA, left MCA areas suggests embolic infarcts.   2D Echo - pending   [name] 2 - negative  LDL - 111  HgbA1c - pending  VTE prophylaxis - Lovenox  aspirin 81 mg daily prior  to admission, now on Eliquis 2.5 bid.  Continue on discharge.  Therapy recommendations:  CIR  Disposition:  Pending  Atrial Fib/Aflutter  Home anticoagulation: Aspirin 81  outpt cardio monitoring confirmed a flutter  This admission EKG consistent with A. Fib  On Eliquis 2.5 twice daily . Continue Eliquis at discharge . Follow-up with [facility] cardiology EP   Hypertension  Home BP meds: Cozaar ; Tenormin  Current BP meds: metoprolol  Stable . Long-term BP goal normotensive  Hyperlipidemia  Home Lipid lowering medication: omega 3  LDL 111, goal < 70  Current lipid lowering medication: Lipitor 40 mg daily & omega 3  Continue statin / omega 3 at discharge  Other Stroke Risk Factors  Advanced age  Coronary artery disease  Other Active Problems  Code status - DNR  UTI - Cipro (started PTA)  History of severe anemia with guaiac positive Summer 2021, put on iron pills, subsequent guaiac was negative.  Currently H&H stable.  left 3rd nerve palsy in 2014 - CTA H/N and MRI neg  Hospital day # 1  Neurology will sign off. Please call with questions. Pt will follow up with stroke clinic NP at [facility] in about 4 weeks. Thanks for the consult.  Rosalin Hawking, MD PhD Stroke Neurology 03/24/2020 5:05 PM   To contact Stroke Continuity provider, please refer to [email or web address] After hours, contact General Neurology

## 2020-03-24 NOTE — Progress Notes (Signed)
Benefits check submitted for Eliquis.  

## 2020-03-24 NOTE — Evaluation (Signed)
Speech Language Pathology Evaluation Patient Details Name: Sabrina Alvarez MRN: [id number] DOB: 16-Aug-1927 Today's Date: 03/24/2020 Time: 5462-7035 SLP Time Calculation (min) (ACUTE ONLY): 23 min  Problem List:  Patient Active Problem List   Diagnosis Date Noted  . CVA (cerebral vascular accident) ([place]) 03/23/2020  . Osteopenia 04/22/2019  . Overactive bladder 04/22/2019  . Hyperkalemia 06/05/2017  . Chronic constipation 02/25/2016  . Iron deficiency anemia 12/03/2015  . Postherpetic neuralgia 09/01/2015  . Cerumen impaction 09/01/2015  . Thyroid disorder 05/26/2015  . Third nerve palsy 05/29/2013  . Essential hypertension   . Hyperlipidemia   . GERD (gastroesophageal reflux disease)   . History of malignant neoplasm of vagina   . Coronary artery disease, occlusive;  100 % CTO of LAD  06/22/2007   Past Medical History:  Past Medical History:  Diagnosis Date  . Abnormal finding on thyroid function test    per old records: T3 low, free T4 elevated, TSH normal.  [place] thyroid made her feel worse and T4 went up so med d/c'd and liothyronine 5 mcg tried but also made pt feel worse.  Endo (Dr. Loanne Drilling felt like Biotin was causing the abnormal TFT's).  Repeat TFTs OFF BIOTIN x 2 wks were NORMAL.  NO FURTHER THYROID TESTING NEEDED.  Marland Kitchen Arthritis    "think it's osteo; got some in my hands"  . Atrial fibrillation ([place]) 02/10/2020   Dr. Roland Rack, [employer] cards-[place],ASA and BB (stopped her ARB)-in sinus-monitor planned--?done  . Bifascicular block   . CAD (coronary artery disease)    Cath 2009  Occluded LAD, 40-50% RCA and circ managed medically.  Repeat 06/22/15 no change.  . Chronic constipation    slow transit.  Barium enema to eval for colonic stricture 10/2014 was NORMAL  . Cognitive dysfunction    short term memory loss--takes ginko biloba  . COPD (chronic obstructive pulmonary disease) ([place])    Changes noted on CXR 2017  . GERD (gastroesophageal reflux disease)    (also LPR)  Schatzki's ring, s/p dil '97; food impaction 08/2010, on PPI therapy since w/out further sx so no dil performed  . History of herpes zoster 06/2015   right side of chest; presented with chest pain mimicking USA--cardiac eval showed stable CAD compared to 2009.  Marland Kitchen Hyperkalemia 06/2017   suspected due to increase of losartan from 25mg  bid to 50mg  bid.  Dose lowered back to 25 mg bid K normalized.  After increase again to 50 qAM and 25 qPM potassium 4.8 so I did not increase dose any further (07/20/17).  . Hyperlipidemia   . Hypertension   . Hyponatremia 10/03/2011   Na 131 on labs 02/2015 at [facility].  Baseline Na 132.  . Iron deficiency anemia 11/30/15; 09/2019   Hemoccults neg x 3 12/2015.  ?Mild malabsorption?  09/2019 Hb drop to 8.5, iron low, hemoccult +.  Oral iron started->Dr. Buccini eval->heme neg in his office->obs/follow Hb on oral Fe; order BE if Hb not responding or having ongoing heme+ stool.  . Mixed stress and urge urinary incontinence   . RBBB with left anterior fascicular block    bifascicular block  . Recurrent UTI    Bactrim prophyl, then changed to cefdinir (?med rxn?).  Cipro qd as of 08/2018.  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 Thrombocytopenia ([place]) 02/2013   Plts 114K  . Uterine cancer ([place])  2000  . Xerostomia 2017/2018   Age related glandular atrophy + med effect (oxybutynin and gabapentin).  ANA and sjogren's panel NEG 06/2017.   Past Surgical History:  Past Surgical History:  Procedure Laterality Date  . ABDOMINAL HYSTERECTOMY  2000  . APPENDECTOMY  2000  . CARDIAC CATHETERIZATION N/A 06/22/2015   Stable single vessel CAD, EF normal.  Procedure: Left Heart Cath and Coronary Angiography;  Surgeon: Leonie Man, MD;  Location: [facility];  Service: Cardiovascular;  Laterality: N/A;  . CATARACT EXTRACTION W/ INTRAOCULAR LENS  IMPLANT, BILATERAL   1990's  . [place]; 1960  . COLONOSCOPY  04/2001   BE neg 10/2014  . DILATION AND CURETTAGE OF UTERUS    . ESOPHAGOGASTRODUODENOSCOPY  1997   for dysphagia->esoph dilatation done  . HERNIA REPAIR     abdominal "twice"  . LEFT HEART CATHETERIZATION WITH CORONARY ANGIOGRAM N/A 10/04/2011   Procedure: LEFT HEART CATHETERIZATION WITH CORONARY ANGIOGRAM;  Surgeon: Jacolyn Reedy, MD;  Location: [facility] CATH LAB;  Service: Cardiovascular;  Laterality: N/A;  . REPAIR KNEE LIGAMENT  ~ 2008   left  . TONSILLECTOMY     as a child  . TRANSTHORACIC ECHOCARDIOGRAM  05/2013   EF 60-65%, trivial aortic regurg, no wall motion abnorm   HPI:  Sabrina Alvarez is a 86 y.o. female with medical history significant of recently diagnosed paroxysmal A. fib, chronic iron deficiency anemia on iron supplement, recurrent UTI on chronic antibiotic suppression, GERD, 3rd cranial nerve palsy, presented with new onset of left-sided weakness and left facial droop. MRI brain shows acute infarct at the right motor strip (left hand representation area) and two additional punctate foci of acute ischemia in the right PCA, left MCA areas suggests embolic infarcts.   Assessment / Plan / Recommendation Clinical Impression  Pt was seen for SLE with son present. She was alert and cooperative throughout the eval. Her verbal expression and auditory comprehension were both observed to be WNL. During a spontaneous speech task, she described a picture with full, detailed sentences without any hestitations or word-finding difficulty. No difficulty with auditory comprehension of commands or yes/no questions was observed. Pt's primary areas of impairment include memory and awareness. She demonstrates decreased recall of new information with both storage and retrieval deficits. Her deficits in awarness are characterized by emergent and intellectual impairment. She was also disoriented to time and situation. Pt and son were educated  regarding treatment for cognition and are hopeful she will be admitted to inpatient rehab where she can continue to receive rehab services. SLP will continue to f/u acutely for cognitive tx.     SLP Assessment  SLP Recommendation/Assessment: Patient needs continued Speech Lanaguage Pathology Services SLP Visit Diagnosis: Cognitive communication deficit (R41.841)    Follow Up Recommendations  Inpatient Rehab    Frequency and Duration min 2x/week  2 weeks      SLP Evaluation Cognition  Overall Cognitive Status: Impaired/Different from baseline Arousal/Alertness: Awake/alert Orientation Level: Oriented to person;Oriented to place;Disoriented to time;Disoriented to situation Attention: Focused;Sustained Focused Attention: Appears intact Sustained Attention: Appears intact Memory: Impaired Memory Impairment: Decreased recall of new information;Storage deficit;Retrieval deficit Awareness: Impaired Awareness Impairment: Emergent impairment;Intellectual impairment Problem Solving: Appears intact Safety/Judgment: Appears intact       Comprehension  Auditory Comprehension Overall Auditory Comprehension: Appears within functional limits for tasks assessed Yes/No Questions: Within Functional Limits Commands: Within Functional Limits Conversation: Simple Visual Recognition/Discrimination Discrimination: Not tested Reading Comprehension Reading Status: Not tested  Expression Expression Primary Mode of Expression: Verbal Verbal Expression Overall Verbal Expression: Appears within functional limits for tasks assessed Initiation: No impairment Automatic Speech: Name;Social Response Level of Generative/Spontaneous Verbalization: Financial controller Expression Dominant Hand: Right   Oral / Motor  Motor Speech Overall Motor Speech: Appears within functional limits for tasks assessed Respiration: Within functional limits Phonation: Normal Resonance: Within functional  limits Articulation: Within functional limitis Intelligibility: Intelligible Motor Planning: Not tested Motor Speech Errors: Not applicable   GO                    Greggory Keen 03/24/2020, 12:19 PM

## 2020-03-24 NOTE — Consult Note (Signed)
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 atrial size was moderately dilated. Right Atrium: Right atrial size was moderately dilated. Pericardium: A small pericardial effusion is present. The pericardial effusion is circumferential. Mitral Valve: The mitral valve is grossly normal. Mild to moderate mitral annular calcification. Mild mitral valve regurgitation. No evidence of mitral valve stenosis. Tricuspid Valve: The tricuspid valve is normal in structure. Tricuspid valve regurgitation is mild . No evidence of tricuspid stenosis. Aortic Valve: The aortic valve is tricuspid. There is mild calcification of the aortic valve. There is mild thickening of the aortic valve. Aortic valve regurgitation is mild. Aortic regurgitation PHT measures 711 msec. Mild aortic valve sclerosis is present, with no evidence of aortic valve stenosis. Pulmonic Valve: The pulmonic valve was grossly normal. Pulmonic valve regurgitation is not visualized. No evidence of pulmonic stenosis. Aorta: The aortic root and ascending aorta are structurally normal, with no evidence of dilitation. Venous: The inferior vena cava is normal in size with greater than 50% respiratory variability, suggesting right atrial pressure of 3 mmHg. IAS/Shunts: The atrial septum is grossly normal.  Additional Comments: There is a small pleural effusion in both left and right lateral regions.  LEFT VENTRICLE PLAX 2D LVIDd:         3.50 cm LVIDs:  2.60 cm LV PW:         1.40 cm LV IVS:        1.40 cm LVOT diam:     1.80 cm LV SV:         52 LV SV Index:   33 LVOT Area:     2.54 cm  RIGHT VENTRICLE TAPSE (M-mode): 1.6 cm LEFT ATRIUM             Index       RIGHT ATRIUM           Index LA diam:        3.80 cm 2.43 cm/m  RA Area:     21.10 cm LA Vol (A2C):   73.1 ml 46.77 ml/m RA Volume:   53.00 ml  33.91 ml/m LA Vol (A4C):   62.6 ml 40.05 ml/m LA Biplane Vol: 73.4 ml 46.96 ml/m  AORTIC VALVE LVOT Vmax:   100.10 cm/s LVOT Vmean:  70.400 cm/s LVOT VTI:    0.204 m AI PHT:      711 msec  AORTA Ao Root diam: 2.70 cm Ao Asc diam:  3.10 cm TRICUSPID VALVE TR Peak grad:   16.3 mmHg TR Vmax:        202.00 cm/s  SHUNTS Systemic VTI:  0.20 m Systemic Diam: 1.80 cm Buford Dresser MD Electronically signed by Buford Dresser MD Signature Date/Time: 03/24/2020/8:49:24 PM    Final    CT HEAD CODE STROKE WO CONTRAST  Result Date: 03/23/2020 CLINICAL DATA:  Code stroke. EXAM: CT HEAD WITHOUT CONTRAST TECHNIQUE: Contiguous axial images were obtained from the base of the skull through the vertex without intravenous contrast. COMPARISON:  2019 FINDINGS: Brain: No acute intracranial hemorrhage. Possible small area of cortical loss of gray-white differentiation along the right precentral gyrus near the hand motor region. May reflect volume averaging with adjacent sulcus. Chronic small vessel infarct of the right lentiform nucleus. Additional patchy and confluent areas of hypoattenuation in the supratentorial white matter nonspecific but probably reflect moderate chronic microvascular ischemic changes. Prominence of the ventricles and sulci reflects generalized parenchymal volume loss. No extra-axial collection. Vascular: No hyperdense vessel. There is intracranial atherosclerotic calcification at the  skull base. Skull: Unremarkable. Sinuses/Orbits: No significant opacification. No acute orbital finding. Other: Mastoid air cells are clear. ASPECTS [facility] Stroke Program Early CT Score) - Ganglionic level infarction (caudate, lentiform nuclei, internal capsule, insula, M1-M3 cortex): 7 - Supraganglionic infarction (M4-M6 cortex): 2 Total score (0-10 with 10 being normal): 9 IMPRESSION: No acute intracranial hemorrhage. Possible small acute cortical infarct involving the right precentral gyrus. Chronic/nonemergent findings detailed above. These results were called by telephone at the time of interpretation on 03/23/2020 at 4:31 pm to provider Dr. Erlinda Hong, Who verbally acknowledged these results. Electronically Signed   By: Macy Mis M.D.   On: 03/23/2020 16:45    Assessment/Plan: Diagnosis: Embolic infarcts Stroke: Continue secondary stroke prophylaxis and Risk Factor Modification listed below:   Antiplatelet therapy:   Blood Pressure Management:  Continue current medication with prn's with permisive HTN per primary team Statin Agent:   PT/OT for mobility, ADL training  Motor recovery: Fluoxetine Labs independently reviewed.  Records reviewed and summated above.  1. Does the need for close, 24 hr/day medical supervision in concert with the patient's rehab needs make it unreasonable for this patient to be served in a less intensive setting? Potentially  2. Co-Morbidities requiring supervision/potential complications: PAF, chronic iron deficiency anemia, COPD, CAD maintained on aspirin, recurrent UTI (  cont Cipro), 3rd nerve cranial palsy, +FOBT (follow-up as outpatient per GI, hyponatremia (repeat labs, cont to monitor) 3. Due to safety, disease management and patient education, does the patient require 24 hr/day rehab nursing? Yes 4. Does the patient require coordinated care of a physician, rehab nurse, therapy disciplines of PT/OT to address physical and functional deficits in the context of the  above medical diagnosis(es)? Potentially Addressing deficits in the following areas: balance, endurance, locomotion, transferring, bathing, dressing, toileting and psychosocial support 5. Can the patient actively participate in an intensive therapy program of at least 3 hrs of therapy per day at least 5 days per week? Yes 6. The potential for patient to make measurable gains while on inpatient rehab is good 7. Anticipated functional outcomes upon discharge from inpatient rehab are supervision  with PT, supervision with OT, modified independent and supervision with SLP. 8. Estimated rehab length of stay to reach the above functional goals is: 5-9 days. 9. Anticipated discharge destination: Home 10. Overall Rehab/Functional Prognosis: good  RECOMMENDATIONS: This patient's condition is appropriate for continued rehabilitative care in the following setting: Patient doing relatively well on day of evaluation and notes continual improvement.  Will consider CIR if patient does not progress to supervision level of functioning and caregiver support available upon discharge. Patient has agreed to participate in recommended program. Yes Note that insurance prior authorization may be required for reimbursement for recommended care.  Comment: Rehab Admissions Coordinator to follow up.  I have personally performed a face to face diagnostic evaluation, including, but not limited to relevant history and physical exam findings, of this patient and developed relevant assessment and plan.  Additionally, I have reviewed and concur with the physician assistant's documentation above.   Delice Lesch, MD, ABPMR Lavon Paganini Angiulli, PA-C 03/25/2020

## 2020-03-24 NOTE — Progress Notes (Signed)
ANTICOAGULATION CONSULT NOTE - Initial Consult  Pharmacy Consult for Apixaban Indication: nonvalvular atrial fibrillation  Allergies  Allergen Reactions  . Cefdinir Hives  . Macrobid [Nitrofurantoin Macrocrystal] Other (See Comments)    "I had chills & fever"    Patient Measurements: Height: 5\' 4"  (162.6 cm) Weight: 53.5 kg (117 lb 15.1 oz) IBW/kg (Calculated) : 54.7   Vital Signs: Temp: 98.1 F (36.7 C) (10/20 1135) Temp Source: Oral (10/20 1135) BP: 177/85 (10/20 1135) Pulse Rate: 60 (10/20 0804)  Labs: Recent Labs    03/23/20 1600 03/23/20 1615  HGB 11.8* 12.6  HCT 36.6 37.0  PLT 158  --   APTT 36  --   LABPROT 14.3  --   INR 1.2  --   CREATININE 0.81 0.70  CKTOTAL 70  --     Estimated Creatinine Clearance: 39.5 mL/min (by C-G formula based on SCr of 0.7 mg/dL).   Medical History: Past Medical History:  Diagnosis Date  . Abnormal finding on thyroid function test    per old records: T3 low, free T4 elevated, TSH normal.  [place] thyroid made her feel worse and T4 went up so med d/c'd and liothyronine 5 mcg tried but also made pt feel worse.  Endo (Dr. Loanne Drilling felt like Biotin was causing the abnormal TFT's).  Repeat TFTs OFF BIOTIN x 2 wks were NORMAL.  NO FURTHER THYROID TESTING NEEDED.  Marland Kitchen Arthritis    "think it's osteo; got some in my hands"  . Atrial fibrillation ([place]) 02/10/2020   Dr. Roland Rack, [employer] cards-[place],ASA and BB (stopped her ARB)-in sinus-monitor planned--?done  . Bifascicular block   . CAD (coronary artery disease)    Cath 2009  Occluded LAD, 40-50% RCA and circ managed medically.  Repeat 06/22/15 no change.  . Chronic constipation    slow transit.  Barium enema to eval for colonic stricture 10/2014 was NORMAL  . Cognitive dysfunction    short term memory loss--takes ginko biloba  . COPD (chronic obstructive pulmonary disease) ([place])    Changes noted on CXR 2017  . GERD (gastroesophageal reflux disease)    (also LPR) Schatzki's ring, s/p  dil '97; food impaction 08/2010, on PPI therapy since w/out further sx so no dil performed  . History of herpes zoster 06/2015   right side of chest; presented with chest pain mimicking [place]--cardiac eval showed stable CAD compared to 2009.  Marland Kitchen Hyperkalemia 06/2017   suspected due to increase of losartan from 25mg  bid to 50mg  bid.  Dose lowered back to 25 mg bid K normalized.  After increase again to 50 qAM and 25 qPM potassium 4.8 so I did not increase dose any further (07/20/17).  . Hyperlipidemia   . Hypertension   . Hyponatremia 10/03/2011   Na 131 on labs 02/2015 at [facility].  Baseline Na 132.  . Iron deficiency anemia 11/30/15; 09/2019   Hemoccults neg x 3 12/2015.  ?Mild malabsorption?  09/2019 Hb drop to 8.5, iron low, hemoccult +.  Oral iron started->Dr. Buccini eval->heme neg in his office->obs/follow Hb on oral Fe; order BE if Hb not responding or having ongoing heme+ stool.  . Mixed stress and urge urinary incontinence   . RBBB with left anterior fascicular block    bifascicular block  . Recurrent UTI    Bactrim prophyl, then changed to cefdinir (?med rxn?).  Cipro qd as of 08/2018.  Marland Kitchen Third nerve palsy 05/2013   presented as diplopia; felt by neuro to be microvascular insult so no carotid  dopplers needed (CT and MRI neg for CVA)  . Thrombocytopenia ([place]) 02/2013   Plts 114K  . Uterine cancer ([place]) 2000  . Xerostomia 2017/2018   Age related glandular atrophy + med effect (oxybutynin and gabapentin).  ANA and sjogren's panel NEG 06/2017.    Assessment: 86 y.o female presented to ED on 03/23/20 with stroke like symptoms> diagnosed with acute CVA She has h/o recently diagnosed PAF but was only on ASA pta due to iron deficiency anemia.  Neuro noted the Fe deficiency anemia was 5-6 months ago and no GI work up and no h/o GIB.  CBC is  Within normal on admit. > neurologist has consulted pharmacy to start Apixaban this evening.   ASA discontinued by Neuro. Age is >80 and weight is <60 kg, will  start apixaban 2.5 mg BID.  10/19 CT head: no acute intracranial hemorrhage 10/19 MRI brain: no evidence of acute hemorrhage  Plan:  Tonight start Apixaban 2.5 mg PO BID Monitor for sign or symptoms of bleeding   Nicole Cella, RPh Clinical Pharmacist [phone] Please check AMION for all [place] phone numbers After 10:00 PM, call [place] [phone] 03/24/2020,12:07 PM

## 2020-03-24 NOTE — Progress Notes (Signed)
  Echocardiogram 2D Echocardiogram has been performed.  Sabrina Alvarez 03/24/2020, 2:12 PM

## 2020-03-25 DIAGNOSIS — E785 Hyperlipidemia, unspecified: Secondary | ICD-10-CM

## 2020-03-25 DIAGNOSIS — E871 Hypo-osmolality and hyponatremia: Secondary | ICD-10-CM

## 2020-03-25 DIAGNOSIS — I480 Paroxysmal atrial fibrillation: Secondary | ICD-10-CM | POA: Diagnosis not present

## 2020-03-25 DIAGNOSIS — I63413 Cerebral infarction due to embolism of bilateral middle cerebral arteries: Principal | ICD-10-CM

## 2020-03-25 DIAGNOSIS — I2510 Atherosclerotic heart disease of native coronary artery without angina pectoris: Secondary | ICD-10-CM

## 2020-03-25 DIAGNOSIS — D638 Anemia in other chronic diseases classified elsewhere: Secondary | ICD-10-CM | POA: Diagnosis not present

## 2020-03-25 DIAGNOSIS — N390 Urinary tract infection, site not specified: Secondary | ICD-10-CM

## 2020-03-25 DIAGNOSIS — J449 Chronic obstructive pulmonary disease, unspecified: Secondary | ICD-10-CM

## 2020-03-25 DIAGNOSIS — R195 Other fecal abnormalities: Secondary | ICD-10-CM

## 2020-03-25 LAB — HEMOGLOBIN A1C
Hgb A1c MFr Bld: 5.6 % (ref 4.8–5.6)
Mean Plasma Glucose: 114 mg/dL

## 2020-03-25 MED ORDER — HYDRALAZINE HCL 25 MG PO TABS
25.0000 mg | ORAL_TABLET | Freq: Four times a day (QID) | ORAL | Status: DC | PRN
  Administered 2020-03-26: 25 mg via ORAL
  Filled 2020-03-25: qty 1

## 2020-03-25 NOTE — NC FL2 (Signed)
[place] LEVEL OF CARE SCREENING TOOL     IDENTIFICATION  Patient Name: Sabrina Alvarez Birthdate: 1927-08-25 Sex: female Admission Date (Current Location): 03/23/2020  [facility] and [place] Number:  Herbalist and Address:  [facility], [place] [address] [place], [place] [address]      Provider Number: [id number]  Attending Physician Name and Address:  Jonetta Osgood, MD  Relative Name and Phone Number:       Current Level of Care: Hospital Recommended Level of Care: [place] Prior Approval Number:    Date Approved/Denied:   PASRR Number: [id number]  Discharge Plan: SNF    Current Diagnoses: Patient Active Problem List   Diagnosis Date Noted  . Dyslipidemia, goal LDL below 70   . PAF (paroxysmal atrial fibrillation) ([place])   . Anemia of chronic disease   . Chronic obstructive pulmonary disease ([place])   . Coronary artery disease involving native coronary artery of native heart without angina pectoris   . Recurrent UTI   . Hyponatremia   . Occult blood in stools   . CVA (cerebral vascular accident) ([place]) 03/23/2020  . Osteopenia 04/22/2019  . Overactive bladder 04/22/2019  . Hyperkalemia 06/05/2017  . Chronic constipation 02/25/2016  . Iron deficiency anemia 12/03/2015  . Postherpetic neuralgia 09/01/2015  . Cerumen impaction 09/01/2015  . Thyroid disorder 05/26/2015  . Third nerve palsy 05/29/2013  . Essential hypertension   . Hyperlipidemia   . GERD (gastroesophageal reflux disease)   . History of malignant neoplasm of vagina   . Coronary artery disease, occlusive;  100 % CTO of LAD  06/22/2007    Orientation RESPIRATION BLADDER Height & Weight     Self  Normal Continent Weight: 117 lb 15.1 oz (53.5 kg) Height:  5\' 4"  (162.6 cm)  BEHAVIORAL SYMPTOMS/MOOD NEUROLOGICAL BOWEL NUTRITION STATUS      Continent Diet (heart healthy)  AMBULATORY STATUS COMMUNICATION OF NEEDS Skin   Limited Assist  Verbally Normal                       Personal Care Assistance Level of Assistance  Bathing, Feeding, Dressing Bathing Assistance: Limited assistance Feeding assistance: Independent Dressing Assistance: Limited assistance     Functional Limitations Info             SPECIAL CARE FACTORS FREQUENCY  PT (By licensed PT), OT (By licensed OT)     PT Frequency: 5x/wk OT Frequency: 5x/wk            Contractures Contractures Info: Not present    Additional Factors Info  Code Status, Allergies Code Status Info: DNR Allergies Info: Cefdinir, Macrobid           Current Medications (03/25/2020):  This is the current hospital active medication list Current Facility-Administered Medications  Medication Dose Route Frequency Provider Last Rate Last Admin  .  stroke: mapping our early stages of recovery book   Does not apply Once Wynetta Fines T, MD      . acetaminophen (TYLENOL) 160 MG/5ML solution 650 mg  650 mg Per Tube Q4H PRN Wynetta Fines T, MD       Or  . acetaminophen (TYLENOL) suppository 650 mg  650 mg Rectal Q4H PRN Wynetta Fines T, MD      . apixaban Arne Cleveland) tablet 2.5 mg  2.5 mg Oral BID Wendee Beavers, RPH   2.5 mg at 03/25/20 1111  . atorvastatin (LIPITOR) tablet 40  mg  40 mg Oral Daily Rinehuls, David L, PA-C   40 mg at 03/25/20 1112  . cevimeline (EVOXAC) capsule 30 mg  30 mg Oral TID Wynetta Fines T, MD      . cholecalciferol (VITAMIN D3) tablet 1,000 Units  1,000 Units Oral Daily Lequita Halt, MD   1,000 Units at 03/25/20 1111  . ciprofloxacin (CIPRO) tablet 250 mg  250 mg Oral Q breakfast Wynetta Fines T, MD   250 mg at 03/25/20 1113  . ferrous sulfate tablet 325 mg  325 mg Oral Q breakfast Wynetta Fines T, MD   325 mg at 03/25/20 1112  . fluticasone (FLONASE) 50 MCG/ACT nasal spray 1 spray  1 spray Each Nare Daily PRN Wynetta Fines T, MD      . influenza vaccine adjuvanted (FLUAD) injection 0.5 mL  0.5 mL Intramuscular Tomorrow-1000 Wynetta Fines T, MD      . metoprolol  tartrate (LOPRESSOR) tablet 25 mg  25 mg Oral BID Wynetta Fines T, MD   25 mg at 03/25/20 1112  . omega-3 acid ethyl esters (LOVAZA) capsule 1 g  1 g Oral Daily Wynetta Fines T, MD   1 g at 03/25/20 1111  . oxybutynin (DITROPAN) tablet 5 mg  5 mg Oral Verneda Skill, MD   5 mg at 03/24/20 1047  . pantoprazole (PROTONIX) EC tablet 40 mg  40 mg Oral Daily Wynetta Fines T, MD   40 mg at 03/25/20 1111  . senna-docusate (Senokot-S) tablet 1 tablet  1 tablet Oral QHS PRN Lequita Halt, MD         Discharge Medications: Please see discharge summary for a list of discharge medications.  Relevant Imaging Results:  Relevant Lab Results:   Additional Information SS#: [id number]  Geralynn Ochs, LCSW

## 2020-03-25 NOTE — TOC Initial Note (Signed)
Transition of Care [facility]) - Initial/Assessment Note    Patient Details  Name: Sabrina Alvarez MRN: [id number] Date of Birth: [birth year]  Transition [facility]) CM/SW Contact:    Pollie Friar, RN Phone Number: 03/25/2020, 3:19 PM  Clinical Narrative:                 CM met with the patient and family after PT to go over SNF vs home with [facility] services. Son is interested in SNF rehab prior to home. He is agreeable to having pt faxed out in the [place] areas.  TOC will provide bed offers once available and the chosen facility will have to start the authorization process.  TOC following.  Expected Discharge Plan: Skilled Nursing Facility Barriers to Discharge: Continued Medical Work up   Patient Goals and CMS Choice   CMS Medicare.gov Compare Post Acute Care list provided to:: Patient Choice offered to / list presented to : Patient, Adult Children  Expected Discharge Plan and Services Expected Discharge Plan: [place] In-house Referral: Clinical Social Work Discharge Planning Services: CM Consult Post Acute Care Choice: [place] arrangements for the past 2 months: [place]                                      Prior Living Arrangements/Services Living arrangements for the past 2 months: Single Family Home Lives with:: Self Patient language and need for interpreter reviewed:: Yes Do you feel safe going back to the place where you live?: Yes      Need for Family Participation in Patient Care: Yes (Comment) Care giver support system in place?: No (comment)   Criminal Activity/Legal Involvement Pertinent to Current Situation/Hospitalization: No - Comment as needed  Activities of Daily Living Home Assistive Devices/Equipment: Cane (specify quad or straight), Walker (specify type) ADL Screening (condition at time of admission) Patient's cognitive ability adequate to safely complete daily activities?: Yes Is  the patient deaf or have difficulty hearing?: Yes Does the patient have difficulty seeing, even when wearing glasses/contacts?: No Does the patient have difficulty concentrating, remembering, or making decisions?: No Patient able to express need for assistance with ADLs?: Yes Does the patient have difficulty dressing or bathing?: Yes Independently performs ADLs?: Yes (appropriate for developmental age) Does the patient have difficulty walking or climbing stairs?: No Weakness of Legs: None Weakness of Arms/Hands: Both  Permission Sought/Granted                  Emotional Assessment Appearance:: Appears stated age Attitude/Demeanor/Rapport: Engaged Affect (typically observed): Accepting Orientation: : Oriented to Self, Oriented to Place   Psych Involvement: No (comment)  Admission diagnosis:  CVA (cerebral vascular accident) ([place]) [I63.9] Cerebrovascular accident (CVA), unspecified mechanism ([place]) [I63.9] Patient Active Problem List   Diagnosis Date Noted  . Dyslipidemia, goal LDL below 70   . PAF (paroxysmal atrial fibrillation) ([place])   . Anemia of chronic disease   . Chronic obstructive pulmonary disease ([place])   . Coronary artery disease involving native coronary artery of native heart without angina pectoris   . Recurrent UTI   . Hyponatremia   . Occult blood in stools   . CVA (cerebral vascular accident) ([place]) 03/23/2020  . Osteopenia 04/22/2019  . Overactive bladder 04/22/2019  . Hyperkalemia 06/05/2017  . Chronic constipation 02/25/2016  . Iron deficiency anemia 12/03/2015  . Postherpetic neuralgia 09/01/2015  .  Cerumen impaction 09/01/2015  . Thyroid disorder 05/26/2015  . Third nerve palsy 05/29/2013  . Essential hypertension   . Hyperlipidemia   . GERD (gastroesophageal reflux disease)   . History of malignant neoplasm of vagina   . Coronary artery disease, occlusive;  100 % CTO of LAD  06/22/2007   PCP:  Tammi Sou, MD Pharmacy:   [id number] [place], [place] [address]Phone: [phone]: [phone]    Social Determinants of Health (SDOH) Interventions    Readmission Risk Interventions No flowsheet data found.

## 2020-03-25 NOTE — Progress Notes (Signed)
Physical Therapy Treatment Patient Details Name: Sabrina Alvarez MRN: [id number] DOB: 1928-04-07 Today's Date: 03/25/2020    History of Present Illness 86 y.o. female with medical history significant of recently diagnosed paroxysmal A. fib, chronic iron deficiency anemia on iron supplement, recurrent UTI on chronic antibiotic suppression, GERD, 3rd cranial nerve palsy, presented with new onset of left-sided weakness. CT head showed no acute abnormalities. 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    PT Comments    Pt tolerates treatment well with improved activity tolerance. Pt continues to demonstrate short term memory deficits, although not as significant as evaluation. Pt also continues to demonstrate device management deficits, although this does improve with continued cueing during session. Pt requires intermittent verbal and tactile cues for safety, but not much physical assistance at this time. PT updates discharge recommendations to home with HHPT and 24/7 supervision vs SNF placement, pending caregiver availability.   Follow Up Recommendations  Home health PT;Supervision/Assistance - 24 hour;SNF (HHPT with 24/7 assistance vs SNF)     Equipment Recommendations  3in1 (PT)    Recommendations for Other Services       Precautions / Restrictions Precautions Precautions: Fall Restrictions Weight Bearing Restrictions: No    Mobility  Bed Mobility Overal bed mobility: Needs Assistance Bed Mobility: Supine to Sit     Supine to sit: Supervision        Transfers Overall transfer level: Needs assistance Equipment used: Rolling walker (2 wheeled) Transfers: Sit to/from [facility] Sit to Stand: Supervision Stand pivot transfers: Supervision       General transfer comment: pt requires cues for device management, continuing to intermittently abandon walker during transfers. This does improve some  by end of session  Ambulation/Gait Ambulation/Gait assistance: Supervision;Min guard Gait Distance (Feet): 80 Feet (15' x2) Assistive device: Rolling walker (2 wheeled) Gait Pattern/deviations: Step-to pattern Gait velocity: reduced Gait velocity interpretation: <1.8 ft/sec, indicate of risk for recurrent falls General Gait Details: pt with slowed step to gait, some assistance required to manage RW in very tight quarters but otherwise supervision level   Stairs             Wheelchair Mobility    Modified Rankin (Stroke Patients Only) Modified Rankin (Stroke Patients Only) Pre-Morbid Rankin Score: Slight disability Modified Rankin: Moderately severe disability     Balance Overall balance assessment: Needs assistance Sitting-balance support: No upper extremity supported;Feet supported Sitting balance-Leahy Scale: Good Sitting balance - Comments: supervision to perform lower body dressing in sitting   Standing balance support: Single extremity supported;No upper extremity supported;During functional activity Standing balance-Leahy Scale: Poor Standing balance comment: minG without UE support or close supervision with unilateral UE support of RW                            Cognition Arousal/Alertness: Awake/alert Behavior During Therapy: WFL for tasks assessed/performed Overall Cognitive Status: Impaired/Different from baseline Area of Impairment: Orientation;Attention;Memory;Following commands;Safety/judgement;Awareness;Problem solving                 Orientation Level: Disoriented to;Situation (pt requires cues to remember she had a stroke) Current Attention Level: Selective Memory: Decreased recall of precautions;Decreased short-term memory Following Commands: Follows one step commands consistently Safety/Judgement: Decreased awareness of safety;Decreased awareness of deficits Awareness: Intellectual Problem Solving: Difficulty sequencing;Requires  verbal cues;Requires tactile cues        Exercises  General Comments General comments (skin integrity, edema, etc.): VSS on RA      Pertinent Vitals/Pain Pain Assessment: No/denies pain    Home Living                      Prior Function            PT Goals (current goals can now be found in the care plan section) Acute Rehab PT Goals Patient Stated Goal: to return home Progress towards PT goals: Progressing toward goals    Frequency    Min 4X/week      PT Plan Discharge plan needs to be updated    Co-evaluation              AM-PAC PT "6 Clicks" Mobility   Outcome Measure  Help needed turning from your back to your side while in a flat bed without using bedrails?: None Help needed moving from lying on your back to sitting on the side of a flat bed without using bedrails?: None Help needed moving to and from a bed to a chair (including a wheelchair)?: None Help needed standing up from a chair using your arms (e.g., wheelchair or bedside chair)?: None Help needed to walk in hospital room?: A Little Help needed climbing 3-5 steps with a railing? : A Lot 6 Click Score: 21    End of Session   Activity Tolerance: Patient tolerated treatment well Patient left: in chair;with call bell/phone within reach;with chair alarm set;with family/visitor present Nurse Communication: Mobility status PT Visit Diagnosis: Unsteadiness on feet (R26.81);Other abnormalities of gait and mobility (R26.89);Other symptoms and signs involving the nervous system (R29.898)     Time: 4076-8088 PT Time Calculation (min) (ACUTE ONLY): 48 min  Charges:  $Gait Training: 23-37 mins $Therapeutic Activity: 8-22 mins                     Zenaida Niece, PT, DPT Acute Rehabilitation Pager: [phone]    Zenaida Niece 03/25/2020, 2:55 PM

## 2020-03-25 NOTE — Progress Notes (Signed)
Inpatient Rehab Admissions:  Inpatient Rehab Consult received.  I met with patient, her son, and Jacqualin Combes, RN CM, at the bedside for rehabilitation assessment and to discuss goals and expectations of an inpatient rehab admission.  Reviewed therapy notes, and discussed updated recs to home with 24/7 supervision versus SNF.  Consistent with recommendations from Dr. Posey Pronto in consult this morning.  Will sign off at this time.    Signed: Shann Medal, PT, DPT Admissions Coordinator [phone] 03/25/20  3:39 PM

## 2020-03-25 NOTE — Progress Notes (Signed)
PROGRESS NOTE        PATIENT DETAILS Name: Sabrina Alvarez Age: 86 y.o. Sex: female Date of Birth: Feb 06, 1928 Admit Date: 03/23/2020 Admitting Physician Lequita Halt, MD FUX:[id number], Adrian Blackwater, MD  Brief Narrative: Patient is a 86 y.o. female who was recently diagnosed with atrial fibrillation presented with left-sided weakness-found to have acute CVA.  See below for further details.  Significant events: 10/19>> admit to [facility] for left-sided weakness-MRI brain positive for CVA.  Significant studies: 10/19>> MRI brain: Acute infarct in Rt motor strip, 2 additional punctate small infarcts in the Rt PCA, Lt MCA areas. 10/19>> CTA head and neck: No large or medium vessel occlusion 10/20>> Echo: EF 55-60% 10/20>> A1c: 5.6 10/20>> LDL: 111  Antimicrobial therapy: None  Microbiology data: 10/19>> blood culture: No growth  Procedures : None  Consults: Neurology  DVT Prophylaxis : apixaban (ELIQUIS) tablet 2.5 mg Start: 03/24/20 2200 apixaban (ELIQUIS) tablet 2.5 mg    Subjective: No major events overnight-lying comfortably in bed.  Assessment/Plan: Acute CVA: Embolic etiology-left arm weakness has improved-started on Eliquis.  Either CIR or SNF on discharge.    Chronic iron deficiency anemia: Also FOBT positive-no overt GI bleeding-after discussion with GI-started on Eliquis.  Continue close monitoring-remains on iron supplementation.  Hemoglobin stable.  Risks/benefits discussed with son at bedside-understands rationale for anticoagulation-we discussed about GI recommendations.  Atrial fibrillation: Continues to have rate controlled atrial fibrillation-on beta-blocker-and Eliquis. HLD: LDL above goal-started on Lipitor.  History of frequent UTI: Appears to be on ciprofloxacin suppressive treatment.  Diet: Diet Order            Diet Heart Room service appropriate? Yes; Fluid consistency: Thin  Diet effective now                  Code  Status:  DNR  Family Communication: Son at bedside  Disposition Plan: Status is: Inpatient  Remains inpatient appropriate because:Inpatient level of care appropriate due to severity of illness   Dispo: The patient is from: Home              Anticipated d/c is to: CIR versus SNF              Anticipated d/c date is: 1 days              Patient currently is  medically stable to d/c.   Barriers to Discharge: Awaiting CIR or SNF bed.  Antimicrobial agents: Anti-infectives (From admission, onward)   Start     Dose/Rate Route Frequency Ordered Stop   03/24/20 0800  ciprofloxacin (CIPRO) tablet 250 mg        250 mg Oral Daily with breakfast 03/23/20 1821         Time spent: 25- minutes-Greater than 50% of this time was spent in counseling, explanation of diagnosis, planning of further management, and coordination of care.  MEDICATIONS: Scheduled Meds: .  stroke: mapping our early stages of recovery book   Does not apply Once  . apixaban  2.5 mg Oral BID  . atorvastatin  40 mg Oral Daily  . cevimeline  30 mg Oral TID  . cholecalciferol  1,000 Units Oral Daily  . ciprofloxacin  250 mg Oral Q breakfast  . ferrous sulfate  325 mg Oral Q breakfast  . influenza vaccine adjuvanted  0.5 mL Intramuscular Tomorrow-1000  .  metoprolol tartrate  25 mg Oral BID  . omega-3 acid ethyl esters  1 g Oral Daily  . oxybutynin  5 mg Oral QODAY  . pantoprazole  40 mg Oral Daily   Continuous Infusions:  PRN Meds:.[DISCONTINUED] acetaminophen **OR** acetaminophen (TYLENOL) oral liquid 160 mg/5 mL **OR** acetaminophen, fluticasone, senna-docusate   PHYSICAL EXAM: Vital signs: Vitals:   03/24/20 2309 03/25/20 0503 03/25/20 0824 03/25/20 1223  BP: (!) 153/66 (!) 169/69 (!) 177/73 (!) 187/87  Pulse: 82 (!) 53 (!) 57 60  Resp: 15 17 20 17   Temp: 98.3 F (36.8 C) 97.9 F (36.6 C) 98.6 F (37 C) 98.5 F (36.9 C)  TempSrc: Oral Oral Oral Oral  SpO2: 99% 94%  100%  Weight:      Height:         Filed Weights   03/23/20 1600 03/23/20 2237  Weight: 57.8 kg 53.5 kg   Body mass index is 20.25 kg/m.   Gen Exam:Alert awake-not in any distress HEENT:atraumatic, normocephalic Chest: B/L clear to auscultation anteriorly CVS:S1S2 regular Abdomen:soft non tender, non distended Extremities:no edema Neurology: Very minimal left upper extremity weakness persists. Skin: no rash  I have personally reviewed following labs and imaging studies  LABORATORY DATA: CBC: Recent Labs  Lab 03/23/20 1600 03/23/20 1615  WBC 7.1  --   NEUTROABS 5.0  --   HGB 11.8* 12.6  HCT 36.6 37.0  MCV 87.6  --   PLT 158  --     Basic Metabolic Panel: Recent Labs  Lab 03/23/20 1600 03/23/20 1615  NA 132* 133*  K 3.9 3.9  CL 98 97*  CO2 26  --   GLUCOSE 102* 101*  BUN 23 26*  CREATININE 0.81 0.70  CALCIUM 8.6*  --     GFR: Estimated Creatinine Clearance: 39.5 mL/min (by C-G formula based on SCr of 0.7 mg/dL).  Liver Function Tests: Recent Labs  Lab 03/23/20 1600  AST 21  ALT 13  ALKPHOS 47  BILITOT 0.9  PROT 6.4*  ALBUMIN 3.4*   No results for input(s): LIPASE, AMYLASE in the last 168 hours. No results for input(s): AMMONIA in the last 168 hours.  Coagulation Profile: Recent Labs  Lab 03/23/20 1600  INR 1.2    Cardiac Enzymes: Recent Labs  Lab 03/23/20 1600  CKTOTAL 70    BNP (last 3 results) No results for input(s): PROBNP in the last 8760 hours.  Lipid Profile: Recent Labs    03/24/20 0603  CHOL 152  HDL 33*  LDLCALC 111*  TRIG 40  CHOLHDL 4.6    Thyroid Function Tests: No results for input(s): TSH, T4TOTAL, FREET4, T3FREE, THYROIDAB in the last 72 hours.  Anemia Panel: No results for input(s): VITAMINB12, FOLATE, FERRITIN, TIBC, IRON, RETICCTPCT in the last 72 hours.  Urine analysis:    Component Value Date/Time   COLORURINE YELLOW 03/23/2020 1859   APPEARANCEUR CLEAR 03/23/2020 1859   LABSPEC 1.023 03/23/2020 1859   PHURINE 6.0 03/23/2020  1859   GLUCOSEU NEGATIVE 03/23/2020 1859   GLUCOSEU NEGATIVE 08/23/2016 1613   HGBUR NEGATIVE 03/23/2020 1859   BILIRUBINUR NEGATIVE 03/23/2020 1859   BILIRUBINUR negative 12/15/2019 1522   KETONESUR 20 (A) 03/23/2020 1859   PROTEINUR NEGATIVE 03/23/2020 1859   UROBILINOGEN 0.2 12/15/2019 1522   UROBILINOGEN 0.2 08/23/2016 1613   NITRITE POSITIVE (A) 03/23/2020 1859   LEUKOCYTESUR MODERATE (A) 03/23/2020 1859    Sepsis Labs: Lactic Acid, Venous    Component Value Date/Time   LATICACIDVEN 1.2 03/23/2020  1600    MICROBIOLOGY: Recent Results (from the past 240 hour(s))  Blood culture (routine x 2)     Status: None (Preliminary result)   Collection Time: 03/23/20  4:40 PM   Specimen: BLOOD LEFT ARM  Result Value Ref Range Status   Specimen Description BLOOD LEFT ARM  Final   Special Requests   Final    BOTTLES DRAWN AEROBIC AND ANAEROBIC Blood Culture results may not be optimal due to an inadequate volume of blood received in culture bottles   Culture   Final    NO GROWTH < 24 HOURS Performed at [facility], [place] [address] [place], [place] [address]    Report Status PENDING  Incomplete  Respiratory Panel by RT PCR (Flu A&B, Covid) - Nasopharyngeal Swab     Status: None   Collection Time: 03/23/20  4:41 PM   Specimen: Nasopharyngeal Swab  Result Value Ref Range Status   SARS Coronavirus 2 by RT PCR NEGATIVE NEGATIVE Final    Comment: (NOTE) SARS-CoV-2 target nucleic acids are NOT DETECTED.  The SARS-CoV-2 RNA is generally detectable in upper respiratoy specimens during the acute phase of infection. The lowest concentration of SARS-CoV-2 viral copies this assay can detect is 131 copies/mL. A negative result does not preclude SARS-Cov-2 infection and should not be used as the sole basis for treatment or other patient management decisions. A negative result may occur with  improper specimen collection/handling, submission of specimen other than nasopharyngeal swab,  presence of viral mutation(s) within the areas targeted by this assay, and inadequate number of viral copies (<131 copies/mL). A negative result must be combined with clinical observations, patient history, and epidemiological information. The expected result is Negative.  Fact Sheet for Patients:  PinkCheek.be  Fact Sheet for Healthcare Providers:  GravelBags.it  This test is no t yet approved or cleared by the [place] FDA and  has been authorized for detection and/or diagnosis of SARS-CoV-2 by FDA under an Emergency Use Authorization (EUA). This EUA will remain  in effect (meaning this test can be used) for the duration of the COVID-19 declaration under Section 564(b)(1) of the Act, 21 U.S.C. section 360bbb-3(b)(1), unless the authorization is terminated or revoked sooner.     Influenza A by PCR NEGATIVE NEGATIVE Final   Influenza B by PCR NEGATIVE NEGATIVE Final    Comment: (NOTE) The Xpert Xpress SARS-CoV-2/FLU/RSV assay is intended as an aid in  the diagnosis of influenza from Nasopharyngeal swab specimens and  should not be used as a sole basis for treatment. Nasal washings and  aspirates are unacceptable for Xpert Xpress SARS-CoV-2/FLU/RSV  testing.  Fact Sheet for Patients: PinkCheek.be  Fact Sheet for Healthcare Providers: GravelBags.it  This test is not yet approved or cleared by the [place] FDA and  has been authorized for detection and/or diagnosis of SARS-CoV-2 by  FDA under an Emergency Use Authorization (EUA). This EUA will remain  in effect (meaning this test can be used) for the duration of the  Covid-19 declaration under Section 564(b)(1) of the Act, 21  U.S.C. section 360bbb-3(b)(1), unless the authorization is  terminated or revoked. Performed at [facility], [place] [address] [place], [place] [address]   Blood culture  (routine x 2)     Status: None (Preliminary result)   Collection Time: 03/23/20  4:43 PM   Specimen: BLOOD RIGHT ARM  Result Value Ref Range Status   Specimen Description BLOOD RIGHT ARM  Final   Special Requests  Final    BOTTLES DRAWN AEROBIC AND ANAEROBIC Blood Culture results may not be optimal due to an inadequate volume of blood received in culture bottles   Culture   Final    NO GROWTH < 24 HOURS Performed at [facility] [address] [place], [place] [address]    Report Status PENDING  Incomplete    RADIOLOGY STUDIES/RESULTS: CT ANGIO HEAD W OR WO CONTRAST  Result Date: 03/23/2020 CLINICAL DATA:  Code stroke presentation. Specific defect not listed. EXAM: CT ANGIOGRAPHY HEAD AND NECK TECHNIQUE: Multidetector CT imaging of the head and neck was performed using the standard protocol during bolus administration of intravenous contrast. Multiplanar CT image reconstructions and MIPs were obtained to evaluate the vascular anatomy. Carotid stenosis measurements (when applicable) are obtained utilizing NASCET criteria, using the distal internal carotid diameter as the denominator. CONTRAST:  28mL OMNIPAQUE IOHEXOL 350 MG/ML SOLN COMPARISON:  Head CT earlier same day. Previous CT angiography 05/29/2013. FINDINGS: CTA NECK FINDINGS Aortic arch: Aortic atherosclerosis. No aneurysm or dissection. Branching pattern is normal without origin stenosis. Right carotid system: Common carotid artery widely patent to the bifurcation. Mild atherosclerotic plaque at the carotid bifurcation but no stenosis. Cervical ICA widely patent. Left carotid system: Common carotid artery widely patent to the bifurcation. Calcified plaque at the carotid bifurcation without stenosis. Cervical ICA widely patent. Vertebral arteries: Both vertebral artery origins are widely patent. Both vertebral arteries widely patent through the cervical region to the foramen magnum. Skeleton: Ordinary cervical spondylosis. Other  neck: No mass or lymphadenopathy. Upper chest: Scarring and interstitial prominence at the lung apices. Small effusions layering dependently. Review of the MIP images confirms the above findings CTA HEAD FINDINGS Anterior circulation: Both internal carotid arteries are widely patent through the skull base and carotid siphon regions. Ordinary siphon atherosclerotic calcification but no stenosis greater than 30%. The anterior and middle cerebral vessels are patent. No large or medium vessel occlusion is visible. No correctable proximal stenosis. Posterior circulation: Both vertebral arteries widely patent through the foramen magnum. Mild calcified plaque at both vertebral artery V4 segments but without stenosis greater than 30%. Both vertebral arteries reach the basilar. No basilar stenosis. Superior cerebellar and posterior cerebral arteries are patent. Venous sinuses: Patent and normal. Anatomic variants: None significant. Review of the MIP images confirms the above findings IMPRESSION: 1. No large or medium vessel occlusion. 2. Mild atherosclerotic disease at both carotid bifurcations but without stenosis. 3. These results were communicated to Xu at [place] 10/19/2021by text page via the [facility] messaging system. Aortic Atherosclerosis (ICD10-I70.0). Electronically Signed   By: Nelson Chimes M.D.   On: 03/23/2020 18:55   CT ANGIO NECK W OR WO CONTRAST  Result Date: 03/23/2020 CLINICAL DATA:  Code stroke presentation. Specific defect not listed. EXAM: CT ANGIOGRAPHY HEAD AND NECK TECHNIQUE: Multidetector CT imaging of the head and neck was performed using the standard protocol during bolus administration of intravenous contrast. Multiplanar CT image reconstructions and MIPs were obtained to evaluate the vascular anatomy. Carotid stenosis measurements (when applicable) are obtained utilizing NASCET criteria, using the distal internal carotid diameter as the denominator. CONTRAST:  5mL OMNIPAQUE IOHEXOL 350 MG/ML  SOLN COMPARISON:  Head CT earlier same day. Previous CT angiography 05/29/2013. FINDINGS: CTA NECK FINDINGS Aortic arch: Aortic atherosclerosis. No aneurysm or dissection. Branching pattern is normal without origin stenosis. Right carotid system: Common carotid artery widely patent to the bifurcation. Mild atherosclerotic plaque at the carotid bifurcation but no stenosis. Cervical ICA widely patent.  Left carotid system: Common carotid artery widely patent to the bifurcation. Calcified plaque at the carotid bifurcation without stenosis. Cervical ICA widely patent. Vertebral arteries: Both vertebral artery origins are widely patent. Both vertebral arteries widely patent through the cervical region to the foramen magnum. Skeleton: Ordinary cervical spondylosis. Other neck: No mass or lymphadenopathy. Upper chest: Scarring and interstitial prominence at the lung apices. Small effusions layering dependently. Review of the MIP images confirms the above findings CTA HEAD FINDINGS Anterior circulation: Both internal carotid arteries are widely patent through the skull base and carotid siphon regions. Ordinary siphon atherosclerotic calcification but no stenosis greater than 30%. The anterior and middle cerebral vessels are patent. No large or medium vessel occlusion is visible. No correctable proximal stenosis. Posterior circulation: Both vertebral arteries widely patent through the foramen magnum. Mild calcified plaque at both vertebral artery V4 segments but without stenosis greater than 30%. Both vertebral arteries reach the basilar. No basilar stenosis. Superior cerebellar and posterior cerebral arteries are patent. Venous sinuses: Patent and normal. Anatomic variants: None significant. Review of the MIP images confirms the above findings IMPRESSION: 1. No large or medium vessel occlusion. 2. Mild atherosclerotic disease at both carotid bifurcations but without stenosis. 3. These results were communicated to Xu at [place] 10/19/2021by text page via the [facility] messaging system. Aortic Atherosclerosis (ICD10-I70.0). Electronically Signed   By: Nelson Chimes M.D.   On: 03/23/2020 18:55   MR BRAIN WO CONTRAST  Result Date: 03/23/2020 CLINICAL DATA:  86 year old female code stroke presentation today. Left hand weakness. EXAM: MRI HEAD WITHOUT CONTRAST TECHNIQUE: Multiplanar, multiecho pulse sequences of the brain and surrounding structures were obtained without intravenous contrast. COMPARISON:  CT head, CTA head and neck earlier today. Brain MRI 05/30/2016, 05/30/2013. FINDINGS: The examination had to be discontinued prior to completion due to patient agitation, inability to continue. Axial and coronal DWI plus motion limited axial FLAIR imaging only was obtained. Diffusion imaging demonstrates a confluent 15 mm area of restricted diffusion at the right superior motor strip corresponding to left hand representation area (series 3, image 40). Mild if any associated FLAIR hyperintensity. No mass effect. Punctate additional focus of restricted diffusion in the right occipital lobe cortex near the occipital pole (series 3, image 20). And questionable additional punctate area of cortical restricted diffusion in the left high superior frontal gyrus (image 43). No intracranial mass effect. No ventriculomegaly. Chronic patchy and confluent bilateral cerebral white matter FLAIR hyperintensity is again evident. Mild FLAIR hyperintensity in the pons. IMPRESSION: 1. Acute 15 mm Infarct at the right motor strip, left hand representation area. 2. One and possibly two additional punctate foci of acute ischemia in the right PCA, left MCA areas suggests embolic infarcts. Query atrial fibrillation. Alternatively, synchronous small vessel disease is possible. 3. Truncated exam. No evidence of acute hemorrhage. No intracranial mass effect. Electronically Signed   By: Genevie Ann M.D.   On: 03/23/2020 19:11   DG Chest Portable 1 View  Result Date:  03/23/2020 CLINICAL DATA:  Fever and confusion EXAM: PORTABLE CHEST 1 VIEW COMPARISON:  06/21/2015 FINDINGS: Cardiomegaly. Aortic atherosclerosis and tortuosity. Poor inspiration. Allowing for that, there are probably chronic abnormal interstitial markings without any focal consolidation, collapse or effusion. IMPRESSION: Poor inspiration. Cardiomegaly. Aortic atherosclerosis. Chronic interstitial lung markings. No active process evident. Electronically Signed   By: Nelson Chimes M.D.   On: 03/23/2020 16:07   ECHOCARDIOGRAM COMPLETE  Result Date: 03/24/2020    ECHOCARDIOGRAM REPORT   Patient Name:  Jamison Oka Date of Exam: 03/24/2020 Medical Rec #:  [id number]        Height:       64.0 in Accession #:    [id number]       Weight:       117.9 lb Date of Birth:  1927-09-25         BSA:          1.563 m Patient Age:    40 years         BP:           159/72 mmHg Patient Gender: F                HR:           66 bpm. Exam Location:  Inpatient Procedure: 2D Echo, Cardiac Doppler and Color Doppler Indications:    Stroke 434.91 / I163.9  History:        Patient has prior history of Echocardiogram examinations, most                 recent 05/30/2013. CAD, COPD, Arrythmias:RBBB and Atrial                 Fibrillation; Risk Factors:Hypertension and Dyslipidemia.  Sonographer:    Bernadene Person RDCS Referring Phys: [id number] [place]  1. Left ventricular ejection fraction, by estimation, is 55 to 60%. The left ventricle has normal function. The left ventricle has no regional wall motion abnormalities. There is moderate concentric left ventricular hypertrophy. Left ventricular diastolic function could not be evaluated.  2. Right ventricular systolic function is normal. The right ventricular size is normal. There is normal pulmonary artery systolic pressure.  3. Left atrial size was moderately dilated.  4. Right atrial size was moderately dilated.  5. A small pericardial effusion is present. The pericardial  effusion is circumferential.  6. The mitral valve is grossly normal. Mild mitral valve regurgitation. No evidence of mitral stenosis.  7. The aortic valve is tricuspid. There is mild calcification of the aortic valve. There is mild thickening of the aortic valve. Aortic valve regurgitation is mild. Mild aortic valve sclerosis is present, with no evidence of aortic valve stenosis.  8. The inferior vena cava is normal in size with greater than 50% respiratory variability, suggesting right atrial pressure of 3 mmHg. Comparison(s): No significant change from prior study. Conclusion(s)/Recommendation(s): Appears to be in atrial flutter throughout study. FINDINGS  Left Ventricle: Left ventricular ejection fraction, by estimation, is 55 to 60%. The left ventricle has normal function. The left ventricle has no regional wall motion abnormalities. The left ventricular internal cavity size was normal in size. There is  moderate concentric left ventricular hypertrophy. Left ventricular diastolic function could not be evaluated. Right Ventricle: The right ventricular size is normal. No increase in right ventricular wall thickness. Right ventricular systolic function is normal. There is normal pulmonary artery systolic pressure. The tricuspid regurgitant velocity is 2.02 m/s, and  with an assumed right atrial pressure of 3 mmHg, the estimated right ventricular systolic pressure is 68.3 mmHg. Left Atrium: Prominent coumadin ridge. Left atrial size was moderately dilated. Right Atrium: Right atrial size was moderately dilated. Pericardium: A small pericardial effusion is present. The pericardial effusion is circumferential. Mitral Valve: The mitral valve is grossly normal. Mild to moderate mitral annular calcification. Mild mitral valve regurgitation. No evidence of mitral valve stenosis. Tricuspid Valve: The tricuspid valve is normal in structure. Tricuspid valve regurgitation is mild . No evidence  of tricuspid stenosis. Aortic  Valve: The aortic valve is tricuspid. There is mild calcification of the aortic valve. There is mild thickening of the aortic valve. Aortic valve regurgitation is mild. Aortic regurgitation PHT measures 711 msec. Mild aortic valve sclerosis is present, with no evidence of aortic valve stenosis. Pulmonic Valve: The pulmonic valve was grossly normal. Pulmonic valve regurgitation is not visualized. No evidence of pulmonic stenosis. Aorta: The aortic root and ascending aorta are structurally normal, with no evidence of dilitation. Venous: The inferior vena cava is normal in size with greater than 50% respiratory variability, suggesting right atrial pressure of 3 mmHg. IAS/Shunts: The atrial septum is grossly normal. Additional Comments: There is a small pleural effusion in both left and right lateral regions.  LEFT VENTRICLE PLAX 2D LVIDd:         3.50 cm LVIDs:         2.60 cm LV PW:         1.40 cm LV IVS:        1.40 cm LVOT diam:     1.80 cm LV SV:         52 LV SV Index:   33 LVOT Area:     2.54 cm  RIGHT VENTRICLE TAPSE (M-mode): 1.6 cm LEFT ATRIUM             Index       RIGHT ATRIUM           Index LA diam:        3.80 cm 2.43 cm/m  RA Area:     21.10 cm LA Vol (A2C):   73.1 ml 46.77 ml/m RA Volume:   53.00 ml  33.91 ml/m LA Vol (A4C):   62.6 ml 40.05 ml/m LA Biplane Vol: 73.4 ml 46.96 ml/m  AORTIC VALVE LVOT Vmax:   100.10 cm/s LVOT Vmean:  70.400 cm/s LVOT VTI:    0.204 m AI PHT:      711 msec  AORTA Ao Root diam: 2.70 cm Ao Asc diam:  3.10 cm TRICUSPID VALVE TR Peak grad:   16.3 mmHg TR Vmax:        202.00 cm/s  SHUNTS Systemic VTI:  0.20 m Systemic Diam: 1.80 cm Buford Dresser MD Electronically signed by Buford Dresser MD Signature Date/Time: 03/24/2020/8:49:24 PM    Final    CT HEAD CODE STROKE WO CONTRAST  Result Date: 03/23/2020 CLINICAL DATA:  Code stroke. EXAM: CT HEAD WITHOUT CONTRAST TECHNIQUE: Contiguous axial images were obtained from the base of the skull through the  vertex without intravenous contrast. COMPARISON:  2019 FINDINGS: Brain: No acute intracranial hemorrhage. Possible small area of cortical loss of gray-white differentiation along the right precentral gyrus near the hand motor region. May reflect volume averaging with adjacent sulcus. Chronic small vessel infarct of the right lentiform nucleus. Additional patchy and confluent areas of hypoattenuation in the supratentorial white matter nonspecific but probably reflect moderate chronic microvascular ischemic changes. Prominence of the ventricles and sulci reflects generalized parenchymal volume loss. No extra-axial collection. Vascular: No hyperdense vessel. There is intracranial atherosclerotic calcification at the skull base. Skull: Unremarkable. Sinuses/Orbits: No significant opacification. No acute orbital finding. Other: Mastoid air cells are clear. ASPECTS [facility] Stroke Program Early CT Score) - Ganglionic level infarction (caudate, lentiform nuclei, internal capsule, insula, M1-M3 cortex): 7 - Supraganglionic infarction (M4-M6 cortex): 2 Total score (0-10 with 10 being normal): 9 IMPRESSION: No acute intracranial hemorrhage. Possible small acute cortical infarct involving the right precentral gyrus. Chronic/nonemergent  findings detailed above. These results were called by telephone at the time of interpretation on 03/23/2020 at 4:31 pm to provider Dr. Erlinda Hong, Who verbally acknowledged these results. Electronically Signed   By: Macy Mis M.D.   On: 03/23/2020 16:45     LOS: 2 days   Oren Binet, MD  [facility]    To contact the attending provider between 7A-7P or the covering provider during after hours 7P-7A, please log into the web site [email or web address] and access using universal [place] password for that web site. If you do not have the password, please call the hospital operator.  03/25/2020, 1:48 PM

## 2020-03-26 ENCOUNTER — Other Ambulatory Visit: Payer: Self-pay

## 2020-03-26 DIAGNOSIS — R451 Restlessness and agitation: Secondary | ICD-10-CM

## 2020-03-26 DIAGNOSIS — J439 Emphysema, unspecified: Secondary | ICD-10-CM

## 2020-03-26 LAB — BASIC METABOLIC PANEL
Anion gap: 12 (ref 5–15)
BUN: 14 mg/dL (ref 8–23)
CO2: 24 mmol/L (ref 22–32)
Calcium: 9.1 mg/dL (ref 8.9–10.3)
Chloride: 99 mmol/L (ref 98–111)
Creatinine, Ser: 0.7 mg/dL (ref 0.44–1.00)
GFR, Estimated: >60 mL/min (ref >60)
Glucose, Bld: 128 mg/dL — ABNORMAL HIGH (ref 70–99)
Potassium: 3.6 mmol/L (ref 3.5–5.1)
Sodium: 135 mmol/L (ref 135–145)

## 2020-03-26 LAB — MAGNESIUM: Magnesium: 1.8 mg/dL (ref 1.7–2.4)

## 2020-03-26 LAB — CBC
HCT: 38.9 % (ref 36.0–46.0)
Hemoglobin: 12.9 g/dL (ref 12.0–15.0)
MCH: 28.3 pg (ref 26.0–34.0)
MCHC: 33.2 g/dL (ref 30.0–36.0)
MCV: 85.3 fL (ref 80.0–100.0)
Platelets: 165 10*3/uL (ref 150–400)
RBC: 4.56 MIL/uL (ref 3.87–5.11)
RDW: 13.5 % (ref 11.5–15.5)
WBC: 7.3 10*3/uL (ref 4.0–10.5)
nRBC: 0 % (ref 0.0–0.2)

## 2020-03-26 LAB — GLUCOSE, CAPILLARY: Glucose-Capillary: 141 mg/dL — ABNORMAL HIGH (ref 70–99)

## 2020-03-26 MED ORDER — LOSARTAN POTASSIUM 50 MG PO TABS
25.0000 mg | ORAL_TABLET | Freq: Every day | ORAL | 0 refills | Status: DC
Start: 2020-03-26 — End: 2020-03-27

## 2020-03-26 MED ORDER — SODIUM CHLORIDE 0.9 % IV SOLN: INTRAVENOUS | Status: DC

## 2020-03-26 MED ORDER — ATORVASTATIN CALCIUM 40 MG PO TABS: 40.0000 mg | ORAL_TABLET | Freq: Every day | ORAL | 0 refills | Status: AC

## 2020-03-26 MED ORDER — QUETIAPINE FUMARATE 25 MG PO TABS: 12.5000 mg | ORAL_TABLET | Freq: Every evening | ORAL | Status: DC | PRN

## 2020-03-26 MED ORDER — HALOPERIDOL LACTATE 5 MG/ML IJ SOLN
2.0000 mg | Freq: Four times a day (QID) | INTRAMUSCULAR | Status: AC | PRN
  Administered 2020-03-26: 2 mg via INTRAVENOUS
  Filled 2020-03-26: qty 1

## 2020-03-26 MED ORDER — WHITE PETROLATUM EX OINT
TOPICAL_OINTMENT | CUTANEOUS | Status: AC
  Administered 2020-03-26: 0.2
  Filled 2020-03-26: qty 28.35

## 2020-03-26 MED ORDER — LABETALOL HCL 5 MG/ML IV SOLN
10.0000 mg | INTRAVENOUS | Status: DC | PRN
  Administered 2020-03-26 (×2): 10 mg via INTRAVENOUS
  Filled 2020-03-26 (×2): qty 4

## 2020-03-26 MED ORDER — HALOPERIDOL LACTATE 5 MG/ML IJ SOLN
1.0000 mg | Freq: Four times a day (QID) | INTRAMUSCULAR | Status: DC | PRN
  Administered 2020-03-26: 1 mg via INTRAVENOUS
  Filled 2020-03-26: qty 1

## 2020-03-26 MED ORDER — APIXABAN 2.5 MG PO TABS
2.5000 mg | ORAL_TABLET | Freq: Two times a day (BID) | ORAL | 0 refills | Status: DC
Start: 2020-03-26 — End: 2021-05-20

## 2020-03-26 MED ORDER — METOPROLOL TARTRATE 25 MG PO TABS: 50.0000 mg | ORAL_TABLET | Freq: Two times a day (BID) | ORAL | 0 refills | Status: DC

## 2020-03-26 NOTE — Plan of Care (Signed)
  Problem: Education: Goal: Knowledge of disease or condition will improve Outcome: Progressing Goal: Knowledge of secondary prevention will improve Outcome: Progressing Goal: Knowledge of patient specific risk factors addressed and post discharge goals established will improve Outcome: Progressing Goal: Individualized Educational Video(s) Outcome: Progressing   Problem: Nutrition: Goal: Risk of aspiration will decrease Outcome: Progressing   Problem: Ischemic Stroke/TIA Tissue Perfusion: Goal: Complications of ischemic stroke/TIA will be minimized Outcome: Progressing
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
## 2020-03-26 NOTE — TOC Transition Note (Addendum)
Transition of Care [facility]) - CM/SW Discharge Note   Patient Details  Name: Sabrina Alvarez MRN: [id number] Date of Birth: 05/02/1928  [facility] of [facility]) CM/SW Contact:  Pollie Friar, RN Phone Number: 03/26/2020, 11:13 AM   Clinical Narrative:    CM met with pts son and patient to go over bed offers for SNF rehab. The patients son has decided to take her home with [facility] services and 24 hour supervision. The son states family can provide 24 hour supervision until they get caregivers arranged.  HH arranged through [facility]. [place] with [facility] accepted the referral.  CM provided son with 30 day free Eliquis card. He will f/u with pharmacy on cost and contact PCP as needed. Pt has transportation home.   Final next level of care: Home w Home Health Services Barriers to Discharge: No Barriers Identified   Patient Goals and CMS Choice   CMS Medicare.gov Compare Post Acute Care list provided to:: Patient Represenative (must comment) Choice offered to / list presented to : Adult Children  Discharge Placement                       Discharge Plan and Services In-house Referral: Clinical Social Work Discharge Planning Services: CM Consult Post Acute Care Choice: Skilled Nursing Facility                    HH Arranged: PT, OT, Social Work [facility] Agency: [facility] Date [place]: 03/26/20   Representative spoke with at [place]: [place] Interventions     Readmission Risk Interventions No flowsheet data found.

## 2020-03-26 NOTE — Care Management Important Message (Signed)
Important Message  Patient Details  Name: Sabrina Alvarez MRN: [id number] Date of Birth: 1928-01-23   Medicare Important Message Given:        Orbie Pyo 03/26/2020, 2:53 PM

## 2020-03-26 NOTE — Progress Notes (Signed)
Pt needed to go to bathroom prior to discharge. Assisted by 2 staff members at 1420. Pt with unsteady gait and following commands prior to entering the BR. RN in BR when pt reached down toward floor. Pt somewhat unresponsive,unable to move her right arm, eyes with left gaze. Pt  attempted to follow commands by sitting up, at that point pt became unresponsive. Pt was then assisted into recliner and pulled to the bed, using staff, pt was lifted and placed into bed. Pt became more alert and awake. Speech slurred

## 2020-03-26 NOTE — Significant Event (Signed)
Rapid Response Event Note   Reason for Call :  Suddenly slumped over in bathroom  Initial Focused Assessment:  Patient is lying flat in the bed.  She is drowsy.   BP 140/63   Hr 57  RR 14  O2 sat 93% on RA She has right arm and leg drift and mild facial droop. Slurred speech and  left arm ataxia.  Per staff she originally came in with left arm ataxia.  Today when she slumped over in the bathroom her right side was flaccid and she had a left gaze.  Interventions:  Code Stroke called for new Right side weakness and slurred speech. Dr Erlinda Hong and Dr Sloan Leiter at bedside to assess patient Symptoms rapidly improving. Orthostatic BP done 140/63 lying,  117/63 sitting, standing not preformed Code stroke canceled.  Plan of Care:  Cancel discharge Gentle hydration  NS 50cc/hr for 10 hours   Event Summary:   MD Notified: Dr Sloan Leiter 1415 Call Time: 6063 Arrival Time: 0160 End Time:  [place]  Raliegh Ip, RN

## 2020-03-26 NOTE — Code Documentation (Signed)
Stroke Response Nurse Documentation Code Documentation  Sabrina Alvarez is a 86 y.o. female admitted to [place] on [address] [place] with hx of stroke. Pt was admitted on 10/19 after she was noted to have a stroke on MRI and left sided weakness. Unable to get tPA due to being outside window and not IR candidate. Neurology evlauted and signed off two days ago. Pt continued to have some left sided ataxia during stay with some delirium at night.   Today, pt was being transported to the bathroom as she was getting ready to discharge. While on the toilet, pt slumped and was noted to have right sided weakness. RN transported patient back to the bed and called RRT. As patient was placed flat, she noted to resolved and become more awake with no deficits. On my arrival, pt had slight right facial droop that resolved with mild slurred speech and drowsiness. LKW 1420.   Neurology and Admitting MD at the bedside. Pt continues to resolve, but remains drowsy. Not a tPA candidate due to being on Eliquis. Orthostatics completed at bedside by Kelli Churn, RN and noted to be positive. See Flowsheet. New Stroke not suspected by MD Erlinda Hong. Potential for hypoperfusion. Pt to have 50 cc/hr of NS for 10 hours and reevaluate for discharge tomorrow. Handoff given to [place], [employer].   Kathrin Greathouse  Stroke Response RN

## 2020-03-26 NOTE — Discharge Summary (Signed)
PATIENT DETAILS Name: Sabrina Alvarez Age: 86 y.o. Sex: female Date of Birth: 06/02/1928 MRN: [id number]. Admitting Physician: Lequita Halt, MD [id number], Adrian Blackwater, MD  Admit Date: 03/23/2020 Discharge date: 03/26/2020  Recommendations for Outpatient Follow-up:  1. Follow up with PCP in 1-2 weeks 2. Please obtain CMP/CBC in one week 3. Please ensure follow-up with neurology  Admitted From:  Home  Disposition: Home with home health services   Home Health:  Yes  Equipment/Devices: None  Discharge Condition: Stable  CODE STATUS: DNR  Diet recommendation:  Diet Order            Diet - low sodium heart healthy           Diet Heart Room service appropriate? Yes; Fluid consistency: Thin  Diet effective now                  Brief Narrative: Patient is a 86 y.o. female who was recently diagnosed with atrial fibrillation presented with left-sided weakness-found to have acute CVA.  See below for further details.  Significant events: 10/19>> admit to [facility] for left-sided weakness-MRI brain positive for CVA.  Significant studies: 10/19>> MRI brain: Acute infarct in Rt motor strip, 2 additional punctate small infarcts in the Rt PCA, Lt MCA areas. 10/19>> CTA head and neck: No large or medium vessel occlusion 10/20>> Echo: EF 55-60% 10/20>> A1c: 5.6 10/20>> LDL: 111  Antimicrobial therapy: None  Microbiology data: 10/19>> blood culture: No growth  Procedures : None  Consults: Neurology  Brief Hospital Course: Acute CVA: Embolic etiology-left arm weakness has improved-started on Eliquis.    Plans are for either CIR or SNF at discharge-however family is elected to take patient home with home health services.  Patient has had some delirium during this hospital stay-and family feels that it would be best if she is back in a familiar surroundings.  Home health services arranged.   Chronic iron deficiency anemia: Also FOBT positive-no overt GI  bleeding-after discussion with GI (Dr. Buccini)-started on Eliquis.  Continue close monitoring-remains on iron supplementation.  Hemoglobin stable.  Risks/benefits discussed with son at bedside-understands rationale for anticoagulation-we discussed about GI recommendations.  Atrial fibrillation: Continues to have rate controlled atrial fibrillation-on beta-blocker-and Eliquis.  HTN: BP creeping up-continue beta-blocker-resume losartan.  Follow with PCP for further optimization.  HLD: LDL above goal-started on Lipitor.  History of frequent UTI: Appears to be on ciprofloxacin suppressive treatment.   Discharge Diagnoses:  Active Problems:   CVA (cerebral vascular accident) ([place])   Dyslipidemia, goal LDL below 70   PAF (paroxysmal atrial fibrillation) (HCC)   Anemia of chronic disease   Chronic obstructive pulmonary disease (HCC)   Coronary artery disease involving native coronary artery of native heart without angina pectoris   Recurrent UTI   Hyponatremia   Occult blood in stools   Discharge Instructions:  Activity:  As tolerated with Full fall precautions use walker/cane & assistance as needed  Discharge Instructions    Ambulatory referral to Neurology   Complete by: As directed    Follow up with stroke clinic NP (Jessica Vanschaick or Cecille Rubin, if both not available, consider Zachery Dauer, or Ahern) at [facility] in about 4 weeks. Thanks.   Call MD for:  extreme fatigue   Complete by: As directed    Call MD for:  persistant dizziness or light-headedness   Complete by: As directed    Diet - low sodium heart healthy   Complete by: As directed  Discharge instructions   Complete by: As directed    Follow with Primary MD  McGowen, Adrian Blackwater, MD in 1-2 weeks  Stop aspirin-you have been placed on Eliquis instead.  Please watch for dark stools, bloody stools, black stools, coffee-ground vomitus-if these occur-please seek immediate medical attention  Blood pressure  medications have been adjusted-you have been switched from atenolol to metoprolol, continue losartan.  Follow with your primary care practitioner for further optimization  Please get a complete blood count and chemistry panel checked by your Primary MD at your next visit, and again as instructed by your Primary MD.  Get Medicines reviewed and adjusted: Please take all your medications with you for your next visit with your Primary MD  Laboratory/radiological data: Please request your Primary MD to go over all hospital tests and procedure/radiological results at the follow up, please ask your Primary MD to get all Hospital records sent to his/her office.  In some cases, they will be blood work, cultures and biopsy results pending at the time of your discharge. Please request that your primary care M.D. follows up on these results.  Also Note the following: If you experience worsening of your admission symptoms, develop shortness of breath, life threatening emergency, suicidal or homicidal thoughts you must seek medical attention immediately by calling 911 or calling your MD immediately  if symptoms less severe.  You must read complete instructions/literature along with all the possible adverse reactions/side effects for all the Medicines you take and that have been prescribed to you. Take any new Medicines after you have completely understood and accpet all the possible adverse reactions/side effects.   Do not drive when taking Pain medications or sleeping medications (Benzodaizepines)  Do not take more than prescribed Pain, Sleep and Anxiety Medications. It is not advisable to combine anxiety,sleep and pain medications without talking with your primary care practitioner  Special Instructions: If you have smoked or chewed Tobacco  in the last 2 yrs please stop smoking, stop any regular Alcohol  and or any Recreational drug use.  Wear Seat belts while driving.  Please note: You were cared for  by a hospitalist during your hospital stay. Once you are discharged, your primary care physician will handle any further medical issues. Please note that NO REFILLS for any discharge medications will be authorized once you are discharged, as it is imperative that you return to your primary care physician (or establish a relationship with a primary care physician if you do not have one) for your post hospital discharge needs so that they can reassess your need for medications and monitor your lab values.   Increase activity slowly   Complete by: As directed      Allergies as of 03/26/2020      Reactions   Cefdinir Hives   Macrobid [nitrofurantoin Macrocrystal] Other (See Comments)   "I had chills & fever"      Medication List    STOP taking these medications   Aspirin Low Dose 81 MG EC tablet Generic drug: aspirin   atenolol 25 MG tablet Commonly known as: TENORMIN     TAKE these medications   apixaban 2.5 MG Tabs tablet Commonly known as: ELIQUIS Take 1 tablet (2.5 mg total) by mouth 2 (two) times daily.   atorvastatin 40 MG tablet Commonly known as: LIPITOR Take 1 tablet (40 mg total) by mouth daily. Start taking on: March 27, 2020   cevimeline 30 MG capsule Commonly known as: EVOXAC TAKE 1 CAPSULE 3 TIMES  A DAY FOR TREATMENT OF CHRONIC DRY MOUTH What changed:   how much to take  how to take this  when to take this   ciprofloxacin 250 MG tablet Commonly known as: CIPRO TAKE 1 TABLET BY MOUTH EVERY DAY FOR BLADDER INFECTION PROPHYLAXIS.   FERROUS SULFATE PO Take 1 tablet by mouth daily.   Fish Oil 1000 MG Caps Take 1 capsule by mouth daily.   fluticasone 50 MCG/ACT nasal spray Commonly known as: FLONASE SPRAY 2 SPRAYS INTO EACH NOSTRIL EVERY DAY What changed: See the new instructions.   losartan 50 MG tablet Commonly known as: COZAAR Take 0.5 tablets (25 mg total) by mouth daily. What changed: See the new instructions.   metoprolol tartrate 25 MG  tablet Commonly known as: LOPRESSOR Take 2 tablets (50 mg total) by mouth 2 (two) times daily.   nitroGLYCERIN 0.4 MG SL tablet Commonly known as: NITROSTAT Place 1 tablet (0.4 mg total) under the tongue every 5 (five) minutes as needed for chest pain.   oxybutynin 5 MG tablet Commonly known as: DITROPAN 1 tab po every other day What changed:   how much to take  how to take this  when to take this   pantoprazole 40 MG tablet Commonly known as: PROTONIX Take 1 tablet (40 mg total) by mouth daily.   polyethylene glycol powder 17 GM/SCOOP powder Commonly known as: GLYCOLAX/MIRALAX TAKE 17 G BY MOUTH 2 (TWO) TIMES DAILY.   Vitamin D3 25 MCG (1000 UT) Caps Take 1,000 Units by mouth daily.       Follow-up Information    [facility]. Schedule an appointment as soon as possible for a visit in 4 week(s).   Specialty: Neurology Contact information: [address] [place], [facility].   Specialty: Home Health Services Why: The home health agency will contact you for the first home visit. Contact information: [address] [place] [address] [phone]        Tammi Sou, MD. Schedule an appointment as soon as possible for a visit in 1 week(s).   Specialty: Family Medicine Contact information: [address] [place] [address] [phone]              Allergies  Allergen Reactions  . Cefdinir Hives  . Macrobid [Nitrofurantoin Macrocrystal] Other (See Comments)    "I had chills & fever"      Other Procedures/Studies: CT ANGIO HEAD W OR WO CONTRAST  Result Date: 03/23/2020 CLINICAL DATA:  Code stroke presentation. Specific defect not listed. EXAM: CT ANGIOGRAPHY HEAD AND NECK TECHNIQUE: Multidetector CT imaging of the head and neck was performed using the standard protocol during bolus administration of intravenous contrast.  Multiplanar CT image reconstructions and MIPs were obtained to evaluate the vascular anatomy. Carotid stenosis measurements (when applicable) are obtained utilizing NASCET criteria, using the distal internal carotid diameter as the denominator. CONTRAST:  73mL OMNIPAQUE IOHEXOL 350 MG/ML SOLN COMPARISON:  Head CT earlier same day. Previous CT angiography 05/29/2013. FINDINGS: CTA NECK FINDINGS Aortic arch: Aortic atherosclerosis. No aneurysm or dissection. Branching pattern is normal without origin stenosis. Right carotid system: Common carotid artery widely patent to the bifurcation. Mild atherosclerotic plaque at the carotid bifurcation but no stenosis. Cervical ICA widely patent. Left carotid system: Common carotid artery widely patent to the bifurcation. Calcified plaque at the carotid bifurcation without stenosis. Cervical ICA widely  patent. Vertebral arteries: Both vertebral artery origins are widely patent. Both vertebral arteries widely patent through the cervical region to the foramen magnum. Skeleton: Ordinary cervical spondylosis. Other neck: No mass or lymphadenopathy. Upper chest: Scarring and interstitial prominence at the lung apices. Small effusions layering dependently. Review of the MIP images confirms the above findings CTA HEAD FINDINGS Anterior circulation: Both internal carotid arteries are widely patent through the skull base and carotid siphon regions. Ordinary siphon atherosclerotic calcification but no stenosis greater than 30%. The anterior and middle cerebral vessels are patent. No large or medium vessel occlusion is visible. No correctable proximal stenosis. Posterior circulation: Both vertebral arteries widely patent through the foramen magnum. Mild calcified plaque at both vertebral artery V4 segments but without stenosis greater than 30%. Both vertebral arteries reach the basilar. No basilar stenosis. Superior cerebellar and posterior cerebral arteries are patent. Venous sinuses:  Patent and normal. Anatomic variants: None significant. Review of the MIP images confirms the above findings IMPRESSION: 1. No large or medium vessel occlusion. 2. Mild atherosclerotic disease at both carotid bifurcations but without stenosis. 3. These results were communicated to [facility] at [place] 10/19/2021by text page via the [facility] messaging system. Aortic Atherosclerosis (ICD10-I70.0). Electronically Signed   By: Nelson Chimes M.D.   On: 03/23/2020 18:55   CT ANGIO NECK W OR WO CONTRAST  Result Date: 03/23/2020 CLINICAL DATA:  Code stroke presentation. Specific defect not listed. EXAM: CT ANGIOGRAPHY HEAD AND NECK TECHNIQUE: Multidetector CT imaging of the head and neck was performed using the standard protocol during bolus administration of intravenous contrast. Multiplanar CT image reconstructions and MIPs were obtained to evaluate the vascular anatomy. Carotid stenosis measurements (when applicable) are obtained utilizing NASCET criteria, using the distal internal carotid diameter as the denominator. CONTRAST:  5mL OMNIPAQUE IOHEXOL 350 MG/ML SOLN COMPARISON:  Head CT earlier same day. Previous CT angiography 05/29/2013. FINDINGS: CTA NECK FINDINGS Aortic arch: Aortic atherosclerosis. No aneurysm or dissection. Branching pattern is normal without origin stenosis. Right carotid system: Common carotid artery widely patent to the bifurcation. Mild atherosclerotic plaque at the carotid bifurcation but no stenosis. Cervical ICA widely patent. Left carotid system: Common carotid artery widely patent to the bifurcation. Calcified plaque at the carotid bifurcation without stenosis. Cervical ICA widely patent. Vertebral arteries: Both vertebral artery origins are widely patent. Both vertebral arteries widely patent through the cervical region to the foramen magnum. Skeleton: Ordinary cervical spondylosis. Other neck: No mass or lymphadenopathy. Upper chest: Scarring and interstitial prominence at the lung apices.  Small effusions layering dependently. Review of the MIP images confirms the above findings CTA HEAD FINDINGS Anterior circulation: Both internal carotid arteries are widely patent through the skull base and carotid siphon regions. Ordinary siphon atherosclerotic calcification but no stenosis greater than 30%. The anterior and middle cerebral vessels are patent. No large or medium vessel occlusion is visible. No correctable proximal stenosis. Posterior circulation: Both vertebral arteries widely patent through the foramen magnum. Mild calcified plaque at both vertebral artery V4 segments but without stenosis greater than 30%. Both vertebral arteries reach the basilar. No basilar stenosis. Superior cerebellar and posterior cerebral arteries are patent. Venous sinuses: Patent and normal. Anatomic variants: None significant. Review of the MIP images confirms the above findings IMPRESSION: 1. No large or medium vessel occlusion. 2. Mild atherosclerotic disease at both carotid bifurcations but without stenosis. 3. These results were communicated to Xu at [place] 10/19/2021by text page via the [facility] messaging system. Aortic Atherosclerosis (ICD10-I70.0). Electronically Signed  By: Nelson Chimes M.D.   On: 03/23/2020 18:55   MR BRAIN WO CONTRAST  Result Date: 03/23/2020 CLINICAL DATA:  86 year old female code stroke presentation today. Left hand weakness. EXAM: MRI HEAD WITHOUT CONTRAST TECHNIQUE: Multiplanar, multiecho pulse sequences of the brain and surrounding structures were obtained without intravenous contrast. COMPARISON:  CT head, CTA head and neck earlier today. Brain MRI 05/30/2016, 05/30/2013. FINDINGS: The examination had to be discontinued prior to completion due to patient agitation, inability to continue. Axial and coronal DWI plus motion limited axial FLAIR imaging only was obtained. Diffusion imaging demonstrates a confluent 15 mm area of restricted diffusion at the right superior motor strip  corresponding to left hand representation area (series 3, image 40). Mild if any associated FLAIR hyperintensity. No mass effect. Punctate additional focus of restricted diffusion in the right occipital lobe cortex near the occipital pole (series 3, image 20). And questionable additional punctate area of cortical restricted diffusion in the left high superior frontal gyrus (image 43). No intracranial mass effect. No ventriculomegaly. Chronic patchy and confluent bilateral cerebral white matter FLAIR hyperintensity is again evident. Mild FLAIR hyperintensity in the pons. IMPRESSION: 1. Acute 15 mm Infarct at the right motor strip, left hand representation area. 2. One and possibly two additional punctate foci of acute ischemia in the right PCA, left MCA areas suggests embolic infarcts. Query atrial fibrillation. Alternatively, synchronous small vessel disease is possible. 3. Truncated exam. No evidence of acute hemorrhage. No intracranial mass effect. Electronically Signed   By: Genevie Ann M.D.   On: 03/23/2020 19:11   DG Chest Portable 1 View  Result Date: 03/23/2020 CLINICAL DATA:  Fever and confusion EXAM: PORTABLE CHEST 1 VIEW COMPARISON:  06/21/2015 FINDINGS: Cardiomegaly. Aortic atherosclerosis and tortuosity. Poor inspiration. Allowing for that, there are probably chronic abnormal interstitial markings without any focal consolidation, collapse or effusion. IMPRESSION: Poor inspiration. Cardiomegaly. Aortic atherosclerosis. Chronic interstitial lung markings. No active process evident. Electronically Signed   By: Nelson Chimes M.D.   On: 03/23/2020 16:07   ECHOCARDIOGRAM COMPLETE  Result Date: 03/24/2020    ECHOCARDIOGRAM REPORT   Patient Name:   ANATASIA TINO Date of Exam: 03/24/2020 Medical Rec #:  [id number]        Height:       64.0 in Accession #:    [id number]       Weight:       117.9 lb Date of Birth:  1927-12-27         BSA:          1.563 m Patient Age:    15 years         BP:           159/72  mmHg Patient Gender: F                HR:           66 bpm. Exam Location:  Inpatient Procedure: 2D Echo, Cardiac Doppler and Color Doppler Indications:    Stroke 434.91 / I163.9  History:        Patient has prior history of Echocardiogram examinations, most                 recent 05/30/2013. CAD, COPD, Arrythmias:RBBB and Atrial                 Fibrillation; Risk Factors:Hypertension and Dyslipidemia.  Sonographer:    Bernadene Person RDCS Referring Phys: [id number] [place]  1. Left ventricular ejection  fraction, by estimation, is 55 to 60%. The left ventricle has normal function. The left ventricle has no regional wall motion abnormalities. There is moderate concentric left ventricular hypertrophy. Left ventricular diastolic function could not be evaluated.  2. Right ventricular systolic function is normal. The right ventricular size is normal. There is normal pulmonary artery systolic pressure.  3. Left atrial size was moderately dilated.  4. Right atrial size was moderately dilated.  5. A small pericardial effusion is present. The pericardial effusion is circumferential.  6. The mitral valve is grossly normal. Mild mitral valve regurgitation. No evidence of mitral stenosis.  7. The aortic valve is tricuspid. There is mild calcification of the aortic valve. There is mild thickening of the aortic valve. Aortic valve regurgitation is mild. Mild aortic valve sclerosis is present, with no evidence of aortic valve stenosis.  8. The inferior vena cava is normal in size with greater than 50% respiratory variability, suggesting right atrial pressure of 3 mmHg. Comparison(s): No significant change from prior study. Conclusion(s)/Recommendation(s): Appears to be in atrial flutter throughout study. FINDINGS  Left Ventricle: Left ventricular ejection fraction, by estimation, is 55 to 60%. The left ventricle has normal function. The left ventricle has no regional wall motion abnormalities. The left ventricular  internal cavity size was normal in size. There is  moderate concentric left ventricular hypertrophy. Left ventricular diastolic function could not be evaluated. Right Ventricle: The right ventricular size is normal. No increase in right ventricular wall thickness. Right ventricular systolic function is normal. There is normal pulmonary artery systolic pressure. The tricuspid regurgitant velocity is 2.02 m/s, and  with an assumed right atrial pressure of 3 mmHg, the estimated right ventricular systolic pressure is 25.0 mmHg. Left Atrium: Prominent coumadin ridge. Left atrial size was moderately dilated. Right Atrium: Right atrial size was moderately dilated. Pericardium: A small pericardial effusion is present. The pericardial effusion is circumferential. Mitral Valve: The mitral valve is grossly normal. Mild to moderate mitral annular calcification. Mild mitral valve regurgitation. No evidence of mitral valve stenosis. Tricuspid Valve: The tricuspid valve is normal in structure. Tricuspid valve regurgitation is mild . No evidence of tricuspid stenosis. Aortic Valve: The aortic valve is tricuspid. There is mild calcification of the aortic valve. There is mild thickening of the aortic valve. Aortic valve regurgitation is mild. Aortic regurgitation PHT measures 711 msec. Mild aortic valve sclerosis is present, with no evidence of aortic valve stenosis. Pulmonic Valve: The pulmonic valve was grossly normal. Pulmonic valve regurgitation is not visualized. No evidence of pulmonic stenosis. Aorta: The aortic root and ascending aorta are structurally normal, with no evidence of dilitation. Venous: The inferior vena cava is normal in size with greater than 50% respiratory variability, suggesting right atrial pressure of 3 mmHg. IAS/Shunts: The atrial septum is grossly normal. Additional Comments: There is a small pleural effusion in both left and right lateral regions.  LEFT VENTRICLE PLAX 2D LVIDd:         3.50 cm LVIDs:          2.60 cm LV PW:         1.40 cm LV IVS:        1.40 cm LVOT diam:     1.80 cm LV SV:         52 LV SV Index:   33 LVOT Area:     2.54 cm  RIGHT VENTRICLE TAPSE (M-mode): 1.6 cm LEFT ATRIUM  Index       RIGHT ATRIUM           Index LA diam:        3.80 cm 2.43 cm/m  RA Area:     21.10 cm LA Vol (A2C):   73.1 ml 46.77 ml/m RA Volume:   53.00 ml  33.91 ml/m LA Vol (A4C):   62.6 ml 40.05 ml/m LA Biplane Vol: 73.4 ml 46.96 ml/m  AORTIC VALVE LVOT Vmax:   100.10 cm/s LVOT Vmean:  70.400 cm/s LVOT VTI:    0.204 m AI PHT:      711 msec  AORTA Ao Root diam: 2.70 cm Ao Asc diam:  3.10 cm TRICUSPID VALVE TR Peak grad:   16.3 mmHg TR Vmax:        202.00 cm/s  SHUNTS Systemic VTI:  0.20 m Systemic Diam: 1.80 cm Buford Dresser MD Electronically signed by Buford Dresser MD Signature Date/Time: 03/24/2020/8:49:24 PM    Final    CT HEAD CODE STROKE WO CONTRAST  Result Date: 03/23/2020 CLINICAL DATA:  Code stroke. EXAM: CT HEAD WITHOUT CONTRAST TECHNIQUE: Contiguous axial images were obtained from the base of the skull through the vertex without intravenous contrast. COMPARISON:  2019 FINDINGS: Brain: No acute intracranial hemorrhage. Possible small area of cortical loss of gray-white differentiation along the right precentral gyrus near the hand motor region. May reflect volume averaging with adjacent sulcus. Chronic small vessel infarct of the right lentiform nucleus. Additional patchy and confluent areas of hypoattenuation in the supratentorial white matter nonspecific but probably reflect moderate chronic microvascular ischemic changes. Prominence of the ventricles and sulci reflects generalized parenchymal volume loss. No extra-axial collection. Vascular: No hyperdense vessel. There is intracranial atherosclerotic calcification at the skull base. Skull: Unremarkable. Sinuses/Orbits: No significant opacification. No acute orbital finding. Other: Mastoid air cells are clear. ASPECTS  [facility] Stroke Program Early CT Score) - Ganglionic level infarction (caudate, lentiform nuclei, internal capsule, insula, M1-M3 cortex): 7 - Supraganglionic infarction (M4-M6 cortex): 2 Total score (0-10 with 10 being normal): 9 IMPRESSION: No acute intracranial hemorrhage. Possible small acute cortical infarct involving the right precentral gyrus. Chronic/nonemergent findings detailed above. These results were called by telephone at the time of interpretation on 03/23/2020 at 4:31 pm to provider Dr. Erlinda Hong, Who verbally acknowledged these results. Electronically Signed   By: Macy Mis M.D.   On: 03/23/2020 16:45      TODAY-DAY OF DISCHARGE:  Subjective:   Sabrina Alvarez today has no headache,no chest abdominal pain,no new weakness tingling or numbness, feels much better wants to go home today.   Objective:   Blood pressure (!) 182/84, pulse 72, temperature 98.2 F (36.8 C), temperature source Oral, resp. rate 20, height 5\' 4"  (1.626 m), weight 53.5 kg, SpO2 95 %.  Intake/Output Summary (Last 24 hours) at 03/26/2020 1131 Last data filed at 03/26/2020 0640 Gross per 24 hour  Intake 320 ml  Output 1800 ml  Net -1480 ml   Filed Weights   03/23/20 1600 03/23/20 2237  Weight: 57.8 kg 53.5 kg    Exam: Awake Alert, Oriented *3, No new F.N deficits, Normal affect [place].AT,PERRAL Supple Neck,No JVD, No cervical lymphadenopathy appriciated.  Symmetrical Chest wall movement, Good air movement bilaterally, CTAB RRR,No Gallops,Rubs or new Murmurs, No Parasternal Heave +ve B.Sounds, Abd Soft, Non tender, No organomegaly appriciated, No rebound -guarding or rigidity. No Cyanosis, Clubbing or edema, No new Rash or bruise   PERTINENT RADIOLOGIC STUDIES: ECHOCARDIOGRAM COMPLETE  Result Date: 03/24/2020  ECHOCARDIOGRAM REPORT   Patient Name:   JUSTINA BERTINI Date of Exam: 03/24/2020 Medical Rec #:  [id number]        Height:       64.0 in Accession #:    [id number]       Weight:       117.9  lb Date of Birth:  [birth year]         BSA:          1.563 m Patient Age:    32 years         BP:           159/72 mmHg Patient Gender: F                HR:           66 bpm. Exam Location:  Inpatient Procedure: 2D Echo, Cardiac Doppler and Color Doppler Indications:    Stroke 434.91 / I163.9  History:        Patient has prior history of Echocardiogram examinations, most                 recent 05/30/2013. CAD, COPD, Arrythmias:RBBB and Atrial                 Fibrillation; Risk Factors:Hypertension and Dyslipidemia.  Sonographer:    Bernadene Person RDCS Referring Phys: [id number] [place]  1. Left ventricular ejection fraction, by estimation, is 55 to 60%. The left ventricle has normal function. The left ventricle has no regional wall motion abnormalities. There is moderate concentric left ventricular hypertrophy. Left ventricular diastolic function could not be evaluated.  2. Right ventricular systolic function is normal. The right ventricular size is normal. There is normal pulmonary artery systolic pressure.  3. Left atrial size was moderately dilated.  4. Right atrial size was moderately dilated.  5. A small pericardial effusion is present. The pericardial effusion is circumferential.  6. The mitral valve is grossly normal. Mild mitral valve regurgitation. No evidence of mitral stenosis.  7. The aortic valve is tricuspid. There is mild calcification of the aortic valve. There is mild thickening of the aortic valve. Aortic valve regurgitation is mild. Mild aortic valve sclerosis is present, with no evidence of aortic valve stenosis.  8. The inferior vena cava is normal in size with greater than 50% respiratory variability, suggesting right atrial pressure of 3 mmHg. Comparison(s): No significant change from prior study. Conclusion(s)/Recommendation(s): Appears to be in atrial flutter throughout study. FINDINGS  Left Ventricle: Left ventricular ejection fraction, by estimation, is 55 to 60%. The left  ventricle has normal function. The left ventricle has no regional wall motion abnormalities. The left ventricular internal cavity size was normal in size. There is  moderate concentric left ventricular hypertrophy. Left ventricular diastolic function could not be evaluated. Right Ventricle: The right ventricular size is normal. No increase in right ventricular wall thickness. Right ventricular systolic function is normal. There is normal pulmonary artery systolic pressure. The tricuspid regurgitant velocity is 2.02 m/s, and  with an assumed right atrial pressure of 3 mmHg, the estimated right ventricular systolic pressure is 96.2 mmHg. Left Atrium: Prominent coumadin ridge. Left atrial size was moderately dilated. Right Atrium: Right atrial size was moderately dilated. Pericardium: A small pericardial effusion is present. The pericardial effusion is circumferential. Mitral Valve: The mitral valve is grossly normal. Mild to moderate mitral annular calcification. Mild mitral valve regurgitation. No evidence of mitral valve stenosis. Tricuspid Valve: The tricuspid valve is normal in  structure. Tricuspid valve regurgitation is mild . No evidence of tricuspid stenosis. Aortic Valve: The aortic valve is tricuspid. There is mild calcification of the aortic valve. There is mild thickening of the aortic valve. Aortic valve regurgitation is mild. Aortic regurgitation PHT measures 711 msec. Mild aortic valve sclerosis is present, with no evidence of aortic valve stenosis. Pulmonic Valve: The pulmonic valve was grossly normal. Pulmonic valve regurgitation is not visualized. No evidence of pulmonic stenosis. Aorta: The aortic root and ascending aorta are structurally normal, with no evidence of dilitation. Venous: The inferior vena cava is normal in size with greater than 50% respiratory variability, suggesting right atrial pressure of 3 mmHg. IAS/Shunts: The atrial septum is grossly normal. Additional Comments: There is a small  pleural effusion in both left and right lateral regions.  LEFT VENTRICLE PLAX 2D LVIDd:         3.50 cm LVIDs:         2.60 cm LV PW:         1.40 cm LV IVS:        1.40 cm LVOT diam:     1.80 cm LV SV:         52 LV SV Index:   33 LVOT Area:     2.54 cm  RIGHT VENTRICLE TAPSE (M-mode): 1.6 cm LEFT ATRIUM             Index       RIGHT ATRIUM           Index LA diam:        3.80 cm 2.43 cm/m  RA Area:     21.10 cm LA Vol (A2C):   73.1 ml 46.77 ml/m RA Volume:   53.00 ml  33.91 ml/m LA Vol (A4C):   62.6 ml 40.05 ml/m LA Biplane Vol: 73.4 ml 46.96 ml/m  AORTIC VALVE LVOT Vmax:   100.10 cm/s LVOT Vmean:  70.400 cm/s LVOT VTI:    0.204 m AI PHT:      711 msec  AORTA Ao Root diam: 2.70 cm Ao Asc diam:  3.10 cm TRICUSPID VALVE TR Peak grad:   16.3 mmHg TR Vmax:        202.00 cm/s  SHUNTS Systemic VTI:  0.20 m Systemic Diam: 1.80 cm Buford Dresser MD Electronically signed by Buford Dresser MD Signature Date/Time: 03/24/2020/8:49:24 PM    Final      PERTINENT LAB RESULTS: CBC: Recent Labs    03/23/20 1600 03/23/20 1600 03/23/20 1615 03/26/20 0404  WBC 7.1  --   --  7.3  HGB 11.8*   < > 12.6 12.9  HCT 36.6   < > 37.0 38.9  PLT 158  --   --  165   < > = values in this interval not displayed.   CMET CMP     Component Value Date/Time   NA 135 03/26/2020 0404   K 3.6 03/26/2020 0404   CL 99 03/26/2020 0404   CO2 24 03/26/2020 0404   GLUCOSE 128 (H) 03/26/2020 0404   BUN 14 03/26/2020 0404   CREATININE 0.70 03/26/2020 0404   CREATININE 0.83 06/29/2017 1425   CALCIUM 9.1 03/26/2020 0404   PROT 6.4 (L) 03/23/2020 1600   ALBUMIN 3.4 (L) 03/23/2020 1600   AST 21 03/23/2020 1600   ALT 13 03/23/2020 1600   ALKPHOS 47 03/23/2020 1600   BILITOT 0.9 03/23/2020 1600   GFRNONAA >60 03/26/2020 0404   GFRAA >60 04/19/2018 1444    GFR  Estimated Creatinine Clearance: 39.5 mL/min (by C-G formula based on SCr of 0.7 mg/dL). No results for input(s): LIPASE, AMYLASE in the last 72  hours. Recent Labs    03/23/20 1600  CKTOTAL 70   Invalid input(s): POCBNP No results for input(s): DDIMER in the last 72 hours. Recent Labs    03/24/20 0603  HGBA1C 5.6   Recent Labs    03/24/20 0603  CHOL 152  HDL 33*  LDLCALC 111*  TRIG 40  CHOLHDL 4.6   No results for input(s): TSH, T4TOTAL, T3FREE, THYROIDAB in the last 72 hours.  Invalid input(s): FREET3 No results for input(s): VITAMINB12, FOLATE, FERRITIN, TIBC, IRON, RETICCTPCT in the last 72 hours. Coags: Recent Labs    03/23/20 1600  INR 1.2   Microbiology: Recent Results (from the past 240 hour(s))  Blood culture (routine x 2)     Status: None (Preliminary result)   Collection Time: 03/23/20  4:40 PM   Specimen: BLOOD LEFT ARM  Result Value Ref Range Status   Specimen Description BLOOD LEFT ARM  Final   Special Requests   Final    BOTTLES DRAWN AEROBIC AND ANAEROBIC Blood Culture results may not be optimal due to an inadequate volume of blood received in culture bottles   Culture   Final    NO GROWTH 3 DAYS Performed at [facility], [place] [address], [place], [place] [address]    Report Status PENDING  Incomplete  Respiratory Panel by RT PCR (Flu A&B, Covid) - Nasopharyngeal Swab     Status: None   Collection Time: 03/23/20  4:41 PM   Specimen: Nasopharyngeal Swab  Result Value Ref Range Status   SARS Coronavirus 2 by RT PCR NEGATIVE NEGATIVE Final    Comment: (NOTE) SARS-CoV-2 target nucleic acids are NOT DETECTED.  The SARS-CoV-2 RNA is generally detectable in upper respiratoy specimens during the acute phase of infection. The lowest concentration of SARS-CoV-2 viral copies this assay can detect is 131 copies/mL. A negative result does not preclude SARS-Cov-2 infection and should not be used as the sole basis for treatment or other patient management decisions. A negative result may occur with  improper specimen collection/handling, submission of specimen other than nasopharyngeal swab,  presence of viral mutation(s) within the areas targeted by this assay, and inadequate number of viral copies (<131 copies/mL). A negative result must be combined with clinical observations, patient history, and epidemiological information. The expected result is Negative.  Fact Sheet for Patients:  PinkCheek.be  Fact Sheet for Healthcare Providers:  GravelBags.it  This test is no t yet approved or cleared by the [place] FDA and  has been authorized for detection and/or diagnosis of SARS-CoV-2 by FDA under an Emergency Use Authorization (EUA). This EUA will remain  in effect (meaning this test can be used) for the duration of the COVID-19 declaration under Section 564(b)(1) of the Act, 21 U.S.C. section 360bbb-3(b)(1), unless the authorization is terminated or revoked sooner.     Influenza A by PCR NEGATIVE NEGATIVE Final   Influenza B by PCR NEGATIVE NEGATIVE Final    Comment: (NOTE) The Xpert Xpress SARS-CoV-2/FLU/RSV assay is intended as an aid in  the diagnosis of influenza from Nasopharyngeal swab specimens and  should not be used as a sole basis for treatment. Nasal washings and  aspirates are unacceptable for Xpert Xpress SARS-CoV-2/FLU/RSV  testing.  Fact Sheet for Patients: PinkCheek.be  Fact Sheet for Healthcare Providers: GravelBags.it  This test is not yet approved or cleared  by the [place] and  has been authorized for detection and/or diagnosis of SARS-CoV-2 by  FDA under an Emergency Use Authorization (EUA). This EUA will remain  in effect (meaning this test can be used) for the duration of the  Covid-19 declaration under Section 564(b)(1) of the Act, 21  U.S.C. section 360bbb-3(b)(1), unless the authorization is  terminated or revoked. Performed at [facility], [place] [address] [place], [place] [address]   Blood culture  (routine x 2)     Status: None (Preliminary result)   Collection Time: 03/23/20  4:43 PM   Specimen: BLOOD RIGHT ARM  Result Value Ref Range Status   Specimen Description BLOOD RIGHT ARM  Final   Special Requests   Final    BOTTLES DRAWN AEROBIC AND ANAEROBIC Blood Culture results may not be optimal due to an inadequate volume of blood received in culture bottles   Culture   Final    NO GROWTH 3 DAYS Performed at [facility], [place] [address] [place], [place] [address]    Report Status PENDING  Incomplete    FURTHER DISCHARGE INSTRUCTIONS:  Get Medicines reviewed and adjusted: Please take all your medications with you for your next visit with your Primary MD  Laboratory/radiological data: Please request your Primary MD to go over all hospital tests and procedure/radiological results at the follow up, please ask your Primary MD to get all Hospital records sent to his/her office.  In some cases, they will be blood work, cultures and biopsy results pending at the time of your discharge. Please request that your primary care M.D. goes through all the records of your hospital data and follows up on these results.  Also Note the following: If you experience worsening of your admission symptoms, develop shortness of breath, life threatening emergency, suicidal or homicidal thoughts you must seek medical attention immediately by calling 911 or calling your MD immediately  if symptoms less severe.  You must read complete instructions/literature along with all the possible adverse reactions/side effects for all the Medicines you take and that have been prescribed to you. Take any new Medicines after you have completely understood and accpet all the possible adverse reactions/side effects.   Do not drive when taking Pain medications or sleeping medications (Benzodaizepines)  Do not take more than prescribed Pain, Sleep and Anxiety Medications. It is not advisable to combine anxiety,sleep  and pain medications without talking with your primary care practitioner  Special Instructions: If you have smoked or chewed Tobacco  in the last 2 yrs please stop smoking, stop any regular Alcohol  and or any Recreational drug use.  Wear Seat belts while driving.  Please note: You were cared for by a hospitalist during your hospital stay. Once you are discharged, your primary care physician will handle any further medical issues. Please note that NO REFILLS for any discharge medications will be authorized once you are discharged, as it is imperative that you return to your primary care physician (or establish a relationship with a primary care physician if you do not have one) for your post hospital discharge needs so that they can reassess your need for medications and monitor your lab values.  Total Time spent coordinating discharge including counseling, education and face to face time equals 35 minutes.  SignedOren Binet 03/26/2020 11:31 AM

## 2020-03-26 NOTE — Progress Notes (Signed)
Syncopized while in the bathroom-evaluated patient with rapid response/neurology team-no focal deficits-speech clear.  Orthostatic vital signs positive.  Hold discharge-give IV fluids-50 cc an hour for 10 hours.  Will reassess tomorrow if okay for discharge.

## 2020-03-26 NOTE — Progress Notes (Signed)
PT Cancellation Note  Patient Details Name: Sabrina Alvarez MRN: [id number] DOB: [birth year]   Cancelled Treatment:    Reason Eval/Treat Not Completed: Patient declined, no reason specified. PT attempted to see patient this morning however pt declined, reporting she needed to take a nap. PT attempted to return later in the day however the pt had recently had syncopal episode. PT will attempt to follow up at a later time.    Zenaida Niece 03/26/2020, 4:04 PM

## 2020-03-26 NOTE — Progress Notes (Signed)
SLP Cancellation Note  Patient Details Name: Sabrina Alvarez MRN: [id number] DOB: 12-Jun-1927   Cancelled treatment:       Reason Eval/Treat Not Completed: Other (comment) (Pt was approached for treatment but she was asleep. Pt's son was present and he reported that the pt was "combative" last night and restrained. He stated that she continues to be confused and is notably more confused when she is woken up. Pt currently has discharge orders and pt's son indicated that she will be returning home with home health services. It was agreed that SLP tx be deferred to allow pt to rest and that she follow up with home health SLP.)  Tobie Poet I. Hardin Negus, [place], [place] Office number [phone] Pager [place] 03/26/2020, 12:33 PM

## 2020-03-26 NOTE — Progress Notes (Signed)
Pt's son Sabrina Alvarez) updated about pt being physical (hitting and kicking at) towards staff and having to be placed in restraints because of her actions toward staff, attempting to get out of bed, and being confused during this shift. It was also explained that myself (care nurse) and Gifty (NT) split times totaling at least an hour a piece sitting in the patient's room trying to redirect her, which was not successful. Her son also was educated about the criterior set forth for the patient to be able to safely get out of the restraints. The patient's son mentioned that he was aware that his mother was demonstrating some confusion he noticed 03/25/2020 visit. He also verbalized an understanding of what was explained to him.

## 2020-03-26 NOTE — Progress Notes (Signed)
Pt w/ non-sustained Vtach this shift, asymptomatic. Dr. Myna Hidalgo updated.

## 2020-03-27 MED ORDER — METOPROLOL TARTRATE 25 MG PO TABS: 25.0000 mg | ORAL_TABLET | Freq: Two times a day (BID) | ORAL | 0 refills | Status: DC

## 2020-03-27 NOTE — Progress Notes (Signed)
The patient is being discharged home.  The patient is to be transported by her son.  IV removed with the catheter intact.  Discharge instructions and prescription information given to the patient who verbalized understanding.

## 2020-03-27 NOTE — Discharge Summary (Signed)
PATIENT DETAILS Name: Sabrina Alvarez Age: 86 y.o. Sex: female Date of Birth: Apr 16, 1928 MRN: [id number]. Admitting Physician: Lequita Halt, MD TWS:[id number], Adrian Blackwater, MD  Admit Date: 03/23/2020 Discharge date: 03/27/2020  Recommendations for Outpatient Follow-up:  1. Follow up with PCP in 1-2 weeks 2. Please obtain CMP/CBC in one week 3. Please ensure follow-up with neurology  Admitted From:  Home  Disposition: Home with home health services   Home Health:  Yes  Equipment/Devices: None  Discharge Condition: Stable  CODE STATUS: DNR  Diet recommendation:  Diet Order            Diet - low sodium heart healthy           Diet Heart Room service appropriate? Yes; Fluid consistency: Thin  Diet effective now                  Brief Narrative: Patient is a 86 y.o. female who was recently diagnosed with atrial fibrillation presented with left-sided weakness-found to have acute CVA.  See below for further details.  Significant events: 10/19>> admit to [facility] for left-sided weakness-MRI brain positive for CVA.  Significant studies: 10/19>> MRI brain: Acute infarct in Rt motor strip, 2 additional punctate small infarcts in the Rt PCA, Lt MCA areas. 10/19>> CTA head and neck: No large or medium vessel occlusion 10/20>> Echo: EF 55-60% 10/20>> A1c: 5.6 10/20>> LDL: 111  Antimicrobial therapy: None  Microbiology data: 10/19>> blood culture: No growth  Procedures : None  Consults: Neurology  Brief Hospital Course: Acute CVA: Embolic etiology-left arm weakness has improved-started on Eliquis.    Plans are for either CIR or SNF at discharge-however family is elected to take patient home with home health services.  Patient has had some delirium during this hospital stay-and family feels that it would be best if she is back in a familiar surroundings.  Home health services arranged.   Syncope: Plans are for discharge on 10/22-however patient sustained a  syncopal episode-orthostatic vital signs were grossly positive.  Discharge was held-patient was hydrated with IV fluids-compression stockings were applied-this morning-per RN-patient feels much better-ambulated-no symptoms.  Stable for discharge.  Allow some permissive hypertension.  Chronic iron deficiency anemia: Also FOBT positive-no overt GI bleeding-after discussion with GI (Dr. Buccini)-started on Eliquis.  Continue close monitoring-remains on iron supplementation.  Hemoglobin stable.  Risks/benefits discussed with son at bedside-understands rationale for anticoagulation-we discussed about GI recommendations.  Atrial fibrillation: Continues to have rate controlled atrial fibrillation-on beta-blocker-and Eliquis.  HTN: BP slowly creeping up-given orthostatic hypotension-we will avoid tight BP control-continue to hold losartan-continue metoprolol.  Follow with PCP for further optimization.  HLD: LDL above goal-started on Lipitor.  History of frequent UTI: Appears to be on ciprofloxacin suppressive treatment.   Discharge Diagnoses:  Active Problems:   CVA (cerebral vascular accident) ([place])   Dyslipidemia, goal LDL below 70   PAF (paroxysmal atrial fibrillation) (HCC)   Anemia of chronic disease   Chronic obstructive pulmonary disease (HCC)   Coronary artery disease involving native coronary artery of native heart without angina pectoris   Recurrent UTI   Hyponatremia   Occult blood in stools   Discharge Instructions:  Activity:  As tolerated with Full fall precautions use walker/cane & assistance as needed  Discharge Instructions    Ambulatory referral to Neurology   Complete by: As directed    Follow up with stroke clinic NP (Jessica Vanschaick or Cecille Rubin, if both not available, consider Zachery Dauer, or Jaynee Eagles) at  [facility] in about 4 weeks. Thanks.   Call MD for:  extreme fatigue   Complete by: As directed    Call MD for:  persistant dizziness or light-headedness    Complete by: As directed    Diet - low sodium heart healthy   Complete by: As directed    Discharge instructions   Complete by: As directed    Follow with Primary MD  McGowen, Adrian Blackwater, MD in 1-2 weeks  Stop aspirin-you have been placed on Eliquis instead.  Please watch for dark stools, bloody stools, black stools, coffee-ground vomitus-if these occur-please seek immediate medical attention  Blood pressure medications have been adjusted-you have been switched from atenolol to metoprolol, continue losartan.  Follow with your primary care practitioner for further optimization  Please get a complete blood count and chemistry panel checked by your Primary MD at your next visit, and again as instructed by your Primary MD.  Get Medicines reviewed and adjusted: Please take all your medications with you for your next visit with your Primary MD  Laboratory/radiological data: Please request your Primary MD to go over all hospital tests and procedure/radiological results at the follow up, please ask your Primary MD to get all Hospital records sent to his/her office.  In some cases, they will be blood work, cultures and biopsy results pending at the time of your discharge. Please request that your primary care M.D. follows up on these results.  Also Note the following: If you experience worsening of your admission symptoms, develop shortness of breath, life threatening emergency, suicidal or homicidal thoughts you must seek medical attention immediately by calling 911 or calling your MD immediately  if symptoms less severe.  You must read complete instructions/literature along with all the possible adverse reactions/side effects for all the Medicines you take and that have been prescribed to you. Take any new Medicines after you have completely understood and accpet all the possible adverse reactions/side effects.   Do not drive when taking Pain medications or sleeping medications  (Benzodaizepines)  Do not take more than prescribed Pain, Sleep and Anxiety Medications. It is not advisable to combine anxiety,sleep and pain medications without talking with your primary care practitioner  Special Instructions: If you have smoked or chewed Tobacco  in the last 2 yrs please stop smoking, stop any regular Alcohol  and or any Recreational drug use.  Wear Seat belts while driving.  Please note: You were cared for by a hospitalist during your hospital stay. Once you are discharged, your primary care physician will handle any further medical issues. Please note that NO REFILLS for any discharge medications will be authorized once you are discharged, as it is imperative that you return to your primary care physician (or establish a relationship with a primary care physician if you do not have one) for your post hospital discharge needs so that they can reassess your need for medications and monitor your lab values.   Increase activity slowly   Complete by: As directed      Allergies as of 03/27/2020      Reactions   Cefdinir Hives   Macrobid [nitrofurantoin Macrocrystal] Other (See Comments)   "I had chills & fever"      Medication List    STOP taking these medications   Aspirin Low Dose 81 MG EC tablet Generic drug: aspirin   atenolol 25 MG tablet Commonly known as: TENORMIN   losartan 50 MG tablet Commonly known as: COZAAR     TAKE  these medications   apixaban 2.5 MG Tabs tablet Commonly known as: ELIQUIS Take 1 tablet (2.5 mg total) by mouth 2 (two) times daily.   atorvastatin 40 MG tablet Commonly known as: LIPITOR Take 1 tablet (40 mg total) by mouth daily.   cevimeline 30 MG capsule Commonly known as: EVOXAC TAKE 1 CAPSULE 3 TIMES A DAY FOR TREATMENT OF CHRONIC DRY MOUTH What changed:   how much to take  how to take this  when to take this   ciprofloxacin 250 MG tablet Commonly known as: CIPRO TAKE 1 TABLET BY MOUTH EVERY DAY FOR BLADDER  INFECTION PROPHYLAXIS.   FERROUS SULFATE PO Take 1 tablet by mouth daily.   Fish Oil 1000 MG Caps Take 1 capsule by mouth daily.   fluticasone 50 MCG/ACT nasal spray Commonly known as: FLONASE SPRAY 2 SPRAYS INTO EACH NOSTRIL EVERY DAY What changed: See the new instructions.   metoprolol tartrate 25 MG tablet Commonly known as: LOPRESSOR Take 1 tablet (25 mg total) by mouth 2 (two) times daily.   nitroGLYCERIN 0.4 MG SL tablet Commonly known as: NITROSTAT Place 1 tablet (0.4 mg total) under the tongue every 5 (five) minutes as needed for chest pain.   oxybutynin 5 MG tablet Commonly known as: DITROPAN 1 tab po every other day What changed:   how much to take  how to take this  when to take this   pantoprazole 40 MG tablet Commonly known as: PROTONIX Take 1 tablet (40 mg total) by mouth daily.   polyethylene glycol powder 17 GM/SCOOP powder Commonly known as: GLYCOLAX/MIRALAX TAKE 17 G BY MOUTH 2 (TWO) TIMES DAILY.   Vitamin D3 25 MCG (1000 UT) Caps Take 1,000 Units by mouth daily.       Follow-up Information    [facility]. Schedule an appointment as soon as possible for a visit in 4 week(s).   Specialty: Neurology Contact information: [address] [place], [facility] Follow up.   Specialty: Home Health Services Why: The home health agency will contact you for the first home visit. Contact information: [address] [place] [address] [phone]        Tammi Sou, MD. Schedule an appointment as soon as possible for a visit in 1 week(s).   Specialty: Family Medicine Contact information: [address] [place] [address] [phone]              Allergies  Allergen Reactions  . Cefdinir Hives  . Macrobid [Nitrofurantoin Macrocrystal] Other (See Comments)    "I had chills & fever"      Other Procedures/Studies: CT  ANGIO HEAD W OR WO CONTRAST  Result Date: 03/23/2020 CLINICAL DATA:  Code stroke presentation. Specific defect not listed. EXAM: CT ANGIOGRAPHY HEAD AND NECK TECHNIQUE: Multidetector CT imaging of the head and neck was performed using the standard protocol during bolus administration of intravenous contrast. Multiplanar CT image reconstructions and MIPs were obtained to evaluate the vascular anatomy. Carotid stenosis measurements (when applicable) are obtained utilizing NASCET criteria, using the distal internal carotid diameter as the denominator. CONTRAST:  35mL OMNIPAQUE IOHEXOL 350 MG/ML SOLN COMPARISON:  Head CT earlier same day. Previous CT angiography 05/29/2013. FINDINGS: CTA NECK FINDINGS Aortic arch: Aortic atherosclerosis. No aneurysm or dissection. Branching pattern is normal without origin stenosis. Right carotid system: Common carotid artery widely patent to the bifurcation. Mild atherosclerotic  plaque at the carotid bifurcation but no stenosis. Cervical ICA widely patent. Left carotid system: Common carotid artery widely patent to the bifurcation. Calcified plaque at the carotid bifurcation without stenosis. Cervical ICA widely patent. Vertebral arteries: Both vertebral artery origins are widely patent. Both vertebral arteries widely patent through the cervical region to the foramen magnum. Skeleton: Ordinary cervical spondylosis. Other neck: No mass or lymphadenopathy. Upper chest: Scarring and interstitial prominence at the lung apices. Small effusions layering dependently. Review of the MIP images confirms the above findings CTA HEAD FINDINGS Anterior circulation: Both internal carotid arteries are widely patent through the skull base and carotid siphon regions. Ordinary siphon atherosclerotic calcification but no stenosis greater than 30%. The anterior and middle cerebral vessels are patent. No large or medium vessel occlusion is visible. No correctable proximal stenosis. Posterior circulation:  Both vertebral arteries widely patent through the foramen magnum. Mild calcified plaque at both vertebral artery V4 segments but without stenosis greater than 30%. Both vertebral arteries reach the basilar. No basilar stenosis. Superior cerebellar and posterior cerebral arteries are patent. Venous sinuses: Patent and normal. Anatomic variants: None significant. Review of the MIP images confirms the above findings IMPRESSION: 1. No large or medium vessel occlusion. 2. Mild atherosclerotic disease at both carotid bifurcations but without stenosis. 3. These results were communicated to Xu at [place] 10/19/2021by text page via the [facility] messaging system. Aortic Atherosclerosis (ICD10-I70.0). Electronically Signed   By: Nelson Chimes M.D.   On: 03/23/2020 18:55   CT ANGIO NECK W OR WO CONTRAST  Result Date: 03/23/2020 CLINICAL DATA:  Code stroke presentation. Specific defect not listed. EXAM: CT ANGIOGRAPHY HEAD AND NECK TECHNIQUE: Multidetector CT imaging of the head and neck was performed using the standard protocol during bolus administration of intravenous contrast. Multiplanar CT image reconstructions and MIPs were obtained to evaluate the vascular anatomy. Carotid stenosis measurements (when applicable) are obtained utilizing NASCET criteria, using the distal internal carotid diameter as the denominator. CONTRAST:  69mL OMNIPAQUE IOHEXOL 350 MG/ML SOLN COMPARISON:  Head CT earlier same day. Previous CT angiography 05/29/2013. FINDINGS: CTA NECK FINDINGS Aortic arch: Aortic atherosclerosis. No aneurysm or dissection. Branching pattern is normal without origin stenosis. Right carotid system: Common carotid artery widely patent to the bifurcation. Mild atherosclerotic plaque at the carotid bifurcation but no stenosis. Cervical ICA widely patent. Left carotid system: Common carotid artery widely patent to the bifurcation. Calcified plaque at the carotid bifurcation without stenosis. Cervical ICA widely patent.  Vertebral arteries: Both vertebral artery origins are widely patent. Both vertebral arteries widely patent through the cervical region to the foramen magnum. Skeleton: Ordinary cervical spondylosis. Other neck: No mass or lymphadenopathy. Upper chest: Scarring and interstitial prominence at the lung apices. Small effusions layering dependently. Review of the MIP images confirms the above findings CTA HEAD FINDINGS Anterior circulation: Both internal carotid arteries are widely patent through the skull base and carotid siphon regions. Ordinary siphon atherosclerotic calcification but no stenosis greater than 30%. The anterior and middle cerebral vessels are patent. No large or medium vessel occlusion is visible. No correctable proximal stenosis. Posterior circulation: Both vertebral arteries widely patent through the foramen magnum. Mild calcified plaque at both vertebral artery V4 segments but without stenosis greater than 30%. Both vertebral arteries reach the basilar. No basilar stenosis. Superior cerebellar and posterior cerebral arteries are patent. Venous sinuses: Patent and normal. Anatomic variants: None significant. Review of the MIP images confirms the above findings IMPRESSION: 1. No large or medium vessel occlusion. 2.  Mild atherosclerotic disease at both carotid bifurcations but without stenosis. 3. These results were communicated to Xu at [place] 10/19/2021by text page via the [facility] messaging system. Aortic Atherosclerosis (ICD10-I70.0). Electronically Signed   By: Nelson Chimes M.D.   On: 03/23/2020 18:55   MR BRAIN WO CONTRAST  Result Date: 03/23/2020 CLINICAL DATA:  86 year old female code stroke presentation today. Left hand weakness. EXAM: MRI HEAD WITHOUT CONTRAST TECHNIQUE: Multiplanar, multiecho pulse sequences of the brain and surrounding structures were obtained without intravenous contrast. COMPARISON:  CT head, CTA head and neck earlier today. Brain MRI 05/30/2016, 05/30/2013.  FINDINGS: The examination had to be discontinued prior to completion due to patient agitation, inability to continue. Axial and coronal DWI plus motion limited axial FLAIR imaging only was obtained. Diffusion imaging demonstrates a confluent 15 mm area of restricted diffusion at the right superior motor strip corresponding to left hand representation area (series 3, image 40). Mild if any associated FLAIR hyperintensity. No mass effect. Punctate additional focus of restricted diffusion in the right occipital lobe cortex near the occipital pole (series 3, image 20). And questionable additional punctate area of cortical restricted diffusion in the left high superior frontal gyrus (image 43). No intracranial mass effect. No ventriculomegaly. Chronic patchy and confluent bilateral cerebral white matter FLAIR hyperintensity is again evident. Mild FLAIR hyperintensity in the pons. IMPRESSION: 1. Acute 15 mm Infarct at the right motor strip, left hand representation area. 2. One and possibly two additional punctate foci of acute ischemia in the right PCA, left MCA areas suggests embolic infarcts. Query atrial fibrillation. Alternatively, synchronous small vessel disease is possible. 3. Truncated exam. No evidence of acute hemorrhage. No intracranial mass effect. Electronically Signed   By: Genevie Ann M.D.   On: 03/23/2020 19:11   DG Chest Portable 1 View  Result Date: 03/23/2020 CLINICAL DATA:  Fever and confusion EXAM: PORTABLE CHEST 1 VIEW COMPARISON:  06/21/2015 FINDINGS: Cardiomegaly. Aortic atherosclerosis and tortuosity. Poor inspiration. Allowing for that, there are probably chronic abnormal interstitial markings without any focal consolidation, collapse or effusion. IMPRESSION: Poor inspiration. Cardiomegaly. Aortic atherosclerosis. Chronic interstitial lung markings. No active process evident. Electronically Signed   By: Nelson Chimes M.D.   On: 03/23/2020 16:07   ECHOCARDIOGRAM COMPLETE  Result Date:  03/24/2020    ECHOCARDIOGRAM REPORT   Patient Name:   WAFA MARTES Date of Exam: 03/24/2020 Medical Rec #:  [id number]        Height:       64.0 in Accession #:    [id number]       Weight:       117.9 lb Date of Birth:  11-08-1927         BSA:          1.563 m Patient Age:    67 years         BP:           159/72 mmHg Patient Gender: F                HR:           66 bpm. Exam Location:  Inpatient Procedure: 2D Echo, Cardiac Doppler and Color Doppler Indications:    Stroke 434.91 / I163.9  History:        Patient has prior history of Echocardiogram examinations, most                 recent 05/30/2013. CAD, COPD, Arrythmias:RBBB and Atrial  Fibrillation; Risk Factors:Hypertension and Dyslipidemia.  Sonographer:    Bernadene Person RDCS Referring Phys: [id number] [place]  1. Left ventricular ejection fraction, by estimation, is 55 to 60%. The left ventricle has normal function. The left ventricle has no regional wall motion abnormalities. There is moderate concentric left ventricular hypertrophy. Left ventricular diastolic function could not be evaluated.  2. Right ventricular systolic function is normal. The right ventricular size is normal. There is normal pulmonary artery systolic pressure.  3. Left atrial size was moderately dilated.  4. Right atrial size was moderately dilated.  5. A small pericardial effusion is present. The pericardial effusion is circumferential.  6. The mitral valve is grossly normal. Mild mitral valve regurgitation. No evidence of mitral stenosis.  7. The aortic valve is tricuspid. There is mild calcification of the aortic valve. There is mild thickening of the aortic valve. Aortic valve regurgitation is mild. Mild aortic valve sclerosis is present, with no evidence of aortic valve stenosis.  8. The inferior vena cava is normal in size with greater than 50% respiratory variability, suggesting right atrial pressure of 3 mmHg. Comparison(s): No significant change  from prior study. Conclusion(s)/Recommendation(s): Appears to be in atrial flutter throughout study. FINDINGS  Left Ventricle: Left ventricular ejection fraction, by estimation, is 55 to 60%. The left ventricle has normal function. The left ventricle has no regional wall motion abnormalities. The left ventricular internal cavity size was normal in size. There is  moderate concentric left ventricular hypertrophy. Left ventricular diastolic function could not be evaluated. Right Ventricle: The right ventricular size is normal. No increase in right ventricular wall thickness. Right ventricular systolic function is normal. There is normal pulmonary artery systolic pressure. The tricuspid regurgitant velocity is 2.02 m/s, and  with an assumed right atrial pressure of 3 mmHg, the estimated right ventricular systolic pressure is 25.0 mmHg. Left Atrium: Prominent coumadin ridge. Left atrial size was moderately dilated. Right Atrium: Right atrial size was moderately dilated. Pericardium: A small pericardial effusion is present. The pericardial effusion is circumferential. Mitral Valve: The mitral valve is grossly normal. Mild to moderate mitral annular calcification. Mild mitral valve regurgitation. No evidence of mitral valve stenosis. Tricuspid Valve: The tricuspid valve is normal in structure. Tricuspid valve regurgitation is mild . No evidence of tricuspid stenosis. Aortic Valve: The aortic valve is tricuspid. There is mild calcification of the aortic valve. There is mild thickening of the aortic valve. Aortic valve regurgitation is mild. Aortic regurgitation PHT measures 711 msec. Mild aortic valve sclerosis is present, with no evidence of aortic valve stenosis. Pulmonic Valve: The pulmonic valve was grossly normal. Pulmonic valve regurgitation is not visualized. No evidence of pulmonic stenosis. Aorta: The aortic root and ascending aorta are structurally normal, with no evidence of dilitation. Venous: The inferior vena  cava is normal in size with greater than 50% respiratory variability, suggesting right atrial pressure of 3 mmHg. IAS/Shunts: The atrial septum is grossly normal. Additional Comments: There is a small pleural effusion in both left and right lateral regions.  LEFT VENTRICLE PLAX 2D LVIDd:         3.50 cm LVIDs:         2.60 cm LV PW:         1.40 cm LV IVS:        1.40 cm LVOT diam:     1.80 cm LV SV:         52 LV SV Index:   33 LVOT Area:  2.54 cm  RIGHT VENTRICLE TAPSE (M-mode): 1.6 cm LEFT ATRIUM             Index       RIGHT ATRIUM           Index LA diam:        3.80 cm 2.43 cm/m  RA Area:     21.10 cm LA Vol (A2C):   73.1 ml 46.77 ml/m RA Volume:   53.00 ml  33.91 ml/m LA Vol (A4C):   62.6 ml 40.05 ml/m LA Biplane Vol: 73.4 ml 46.96 ml/m  AORTIC VALVE LVOT Vmax:   100.10 cm/s LVOT Vmean:  70.400 cm/s LVOT VTI:    0.204 m AI PHT:      711 msec  AORTA Ao Root diam: 2.70 cm Ao Asc diam:  3.10 cm TRICUSPID VALVE TR Peak grad:   16.3 mmHg TR Vmax:        202.00 cm/s  SHUNTS Systemic VTI:  0.20 m Systemic Diam: 1.80 cm Buford Dresser MD Electronically signed by Buford Dresser MD Signature Date/Time: 03/24/2020/8:49:24 PM    Final    CT HEAD CODE STROKE WO CONTRAST  Result Date: 03/23/2020 CLINICAL DATA:  Code stroke. EXAM: CT HEAD WITHOUT CONTRAST TECHNIQUE: Contiguous axial images were obtained from the base of the skull through the vertex without intravenous contrast. COMPARISON:  2019 FINDINGS: Brain: No acute intracranial hemorrhage. Possible small area of cortical loss of gray-white differentiation along the right precentral gyrus near the hand motor region. May reflect volume averaging with adjacent sulcus. Chronic small vessel infarct of the right lentiform nucleus. Additional patchy and confluent areas of hypoattenuation in the supratentorial white matter nonspecific but probably reflect moderate chronic microvascular ischemic changes. Prominence of the ventricles and sulci  reflects generalized parenchymal volume loss. No extra-axial collection. Vascular: No hyperdense vessel. There is intracranial atherosclerotic calcification at the skull base. Skull: Unremarkable. Sinuses/Orbits: No significant opacification. No acute orbital finding. Other: Mastoid air cells are clear. ASPECTS [facility] Stroke Program Early CT Score) - Ganglionic level infarction (caudate, lentiform nuclei, internal capsule, insula, M1-M3 cortex): 7 - Supraganglionic infarction (M4-M6 cortex): 2 Total score (0-10 with 10 being normal): 9 IMPRESSION: No acute intracranial hemorrhage. Possible small acute cortical infarct involving the right precentral gyrus. Chronic/nonemergent findings detailed above. These results were called by telephone at the time of interpretation on 03/23/2020 at 4:31 pm to provider Dr. Erlinda Hong, Who verbally acknowledged these results. Electronically Signed   By: Macy Mis M.D.   On: 03/23/2020 16:45     TODAY-DAY OF DISCHARGE:  Subjective:   Sabrina Alvarez today has no headache,no chest abdominal pain,no new weakness tingling or numbness, feels much better wants to go home today.   Objective:   Blood pressure (!) 146/66, pulse (!) 52, temperature 98.9 F (37.2 C), temperature source Oral, resp. rate 16, height 5\' 4"  (1.626 m), weight 53.5 kg, SpO2 96 %.  Intake/Output Summary (Last 24 hours) at 03/27/2020 1227 Last data filed at 03/27/2020 1100 Gross per 24 hour  Intake 252.5 ml  Output 200 ml  Net 52.5 ml   Filed Weights   03/23/20 1600 03/23/20 2237  Weight: 57.8 kg 53.5 kg    Exam: Awake Alert, Oriented *3, No new F.N deficits, Normal affect [place].AT,PERRAL Supple Neck,No JVD, No cervical lymphadenopathy appriciated.  Symmetrical Chest wall movement, Good air movement bilaterally, CTAB RRR,No Gallops,Rubs or new Murmurs, No Parasternal Heave +ve B.Sounds, Abd Soft, Non tender, No organomegaly appriciated, No rebound -guarding or rigidity.  No Cyanosis,  Clubbing or edema, No new Rash or bruise   PERTINENT RADIOLOGIC STUDIES: No results found.   PERTINENT LAB RESULTS: CBC: Recent Labs    03/26/20 0404  WBC 7.3  HGB 12.9  HCT 38.9  PLT 165   CMET CMP     Component Value Date/Time   NA 135 03/26/2020 0404   K 3.6 03/26/2020 0404   CL 99 03/26/2020 0404   CO2 24 03/26/2020 0404   GLUCOSE 128 (H) 03/26/2020 0404   BUN 14 03/26/2020 0404   CREATININE 0.70 03/26/2020 0404   CREATININE 0.83 06/29/2017 1425   CALCIUM 9.1 03/26/2020 0404   PROT 6.4 (L) 03/23/2020 1600   ALBUMIN 3.4 (L) 03/23/2020 1600   AST 21 03/23/2020 1600   ALT 13 03/23/2020 1600   ALKPHOS 47 03/23/2020 1600   BILITOT 0.9 03/23/2020 1600   GFRNONAA >60 03/26/2020 0404   GFRAA >60 04/19/2018 1444    GFR Estimated Creatinine Clearance: 39.5 mL/min (by C-G formula based on SCr of 0.7 mg/dL). No results for input(s): LIPASE, AMYLASE in the last 72 hours. No results for input(s): CKTOTAL, CKMB, CKMBINDEX, TROPONINI in the last 72 hours. Invalid input(s): POCBNP No results for input(s): DDIMER in the last 72 hours. No results for input(s): HGBA1C in the last 72 hours. No results for input(s): CHOL, HDL, LDLCALC, TRIG, CHOLHDL, LDLDIRECT in the last 72 hours. No results for input(s): TSH, T4TOTAL, T3FREE, THYROIDAB in the last 72 hours.  Invalid input(s): FREET3 No results for input(s): VITAMINB12, FOLATE, FERRITIN, TIBC, IRON, RETICCTPCT in the last 72 hours. Coags: No results for input(s): INR in the last 72 hours.  Invalid input(s): PT Microbiology: Recent Results (from the past 240 hour(s))  Blood culture (routine x 2)     Status: None (Preliminary result)   Collection Time: 03/23/20  4:40 PM   Specimen: BLOOD LEFT ARM  Result Value Ref Range Status   Specimen Description BLOOD LEFT ARM  Final   Special Requests   Final    BOTTLES DRAWN AEROBIC AND ANAEROBIC Blood Culture results may not be optimal due to an inadequate volume of blood received  in culture bottles   Culture   Final    NO GROWTH 4 DAYS Performed at [facility], [place] [address], [place], [place] [address]    Report Status PENDING  Incomplete  Respiratory Panel by RT PCR (Flu A&B, Covid) - Nasopharyngeal Swab     Status: None   Collection Time: 03/23/20  4:41 PM   Specimen: Nasopharyngeal Swab  Result Value Ref Range Status   SARS Coronavirus 2 by RT PCR NEGATIVE NEGATIVE Final    Comment: (NOTE) SARS-CoV-2 target nucleic acids are NOT DETECTED.  The SARS-CoV-2 RNA is generally detectable in upper respiratoy specimens during the acute phase of infection. The lowest concentration of SARS-CoV-2 viral copies this assay can detect is 131 copies/mL. A negative result does not preclude SARS-Cov-2 infection and should not be used as the sole basis for treatment or other patient management decisions. A negative result may occur with  improper specimen collection/handling, submission of specimen other than nasopharyngeal swab, presence of viral mutation(s) within the areas targeted by this assay, and inadequate number of viral copies (<131 copies/mL). A negative result must be combined with clinical observations, patient history, and epidemiological information. The expected result is Negative.  Fact Sheet for Patients:  PinkCheek.be  Fact Sheet for Healthcare Providers:  GravelBags.it  This test is no t yet approved or  cleared by the [place] and  has been authorized for detection and/or diagnosis of SARS-CoV-2 by FDA under an Emergency Use Authorization (EUA). This EUA will remain  in effect (meaning this test can be used) for the duration of the COVID-19 declaration under Section 564(b)(1) of the Act, 21 U.S.C. section 360bbb-3(b)(1), unless the authorization is terminated or revoked sooner.     Influenza A by PCR NEGATIVE NEGATIVE Final   Influenza B by PCR NEGATIVE NEGATIVE Final     Comment: (NOTE) The Xpert Xpress SARS-CoV-2/FLU/RSV assay is intended as an aid in  the diagnosis of influenza from Nasopharyngeal swab specimens and  should not be used as a sole basis for treatment. Nasal washings and  aspirates are unacceptable for Xpert Xpress SARS-CoV-2/FLU/RSV  testing.  Fact Sheet for Patients: PinkCheek.be  Fact Sheet for Healthcare Providers: GravelBags.it  This test is not yet approved or cleared by the [place] FDA and  has been authorized for detection and/or diagnosis of SARS-CoV-2 by  FDA under an Emergency Use Authorization (EUA). This EUA will remain  in effect (meaning this test can be used) for the duration of the  Covid-19 declaration under Section 564(b)(1) of the Act, 21  U.S.C. section 360bbb-3(b)(1), unless the authorization is  terminated or revoked. Performed at [facility], [place] [address] [place], [place] [address]   Blood culture (routine x 2)     Status: None (Preliminary result)   Collection Time: 03/23/20  4:43 PM   Specimen: BLOOD RIGHT ARM  Result Value Ref Range Status   Specimen Description BLOOD RIGHT ARM  Final   Special Requests   Final    BOTTLES DRAWN AEROBIC AND ANAEROBIC Blood Culture results may not be optimal due to an inadequate volume of blood received in culture bottles   Culture   Final    NO GROWTH 4 DAYS Performed at [facility], [place] [address], [place], [place] [address]    Report Status PENDING  Incomplete    FURTHER DISCHARGE INSTRUCTIONS:  Get Medicines reviewed and adjusted: Please take all your medications with you for your next visit with your Primary MD  Laboratory/radiological data: Please request your Primary MD to go over all hospital tests and procedure/radiological results at the follow up, please ask your Primary MD to get all Hospital records sent to his/her office.  In some cases, they will be blood work, cultures  and biopsy results pending at the time of your discharge. Please request that your primary care M.D. goes through all the records of your hospital data and follows up on these results.  Also Note the following: If you experience worsening of your admission symptoms, develop shortness of breath, life threatening emergency, suicidal or homicidal thoughts you must seek medical attention immediately by calling 911 or calling your MD immediately  if symptoms less severe.  You must read complete instructions/literature along with all the possible adverse reactions/side effects for all the Medicines you take and that have been prescribed to you. Take any new Medicines after you have completely understood and accpet all the possible adverse reactions/side effects.   Do not drive when taking Pain medications or sleeping medications (Benzodaizepines)  Do not take more than prescribed Pain, Sleep and Anxiety Medications. It is not advisable to combine anxiety,sleep and pain medications without talking with your primary care practitioner  Special Instructions: If you have smoked or chewed Tobacco  in the last 2 yrs please stop smoking, stop any  regular Alcohol  and or any Recreational drug use.  Wear Seat belts while driving.  Please note: You were cared for by a hospitalist during your hospital stay. Once you are discharged, your primary care physician will handle any further medical issues. Please note that NO REFILLS for any discharge medications will be authorized once you are discharged, as it is imperative that you return to your primary care physician (or establish a relationship with a primary care physician if you do not have one) for your post hospital discharge needs so that they can reassess your need for medications and monitor your lab values.  Total Time spent coordinating discharge including counseling, education and face to face time equals 35 minutes.  SignedOren Binet 03/27/2020 12:27 PM

## 2020-03-27 NOTE — Plan of Care (Signed)
  Problem: Education: Goal: Knowledge of disease or condition will improve Outcome: Progressing   Problem: Nutrition: Goal: Risk of aspiration will decrease Outcome: Progressing   Problem: Activity: Goal: Risk for activity intolerance will decrease Outcome: Progressing   Problem: Nutrition: Goal: Adequate nutrition will be maintained Outcome: Progressing

## 2020-03-27 NOTE — Plan of Care (Signed)
  Problem: Safety: Goal: Ability to remain free from injury will improve 03/27/2020 1213 by Caroll Rancher, RN Outcome: Adequate for Discharge 03/27/2020 1212 by Caroll Rancher, RN Outcome: Progressing

## 2020-03-28 ENCOUNTER — Encounter: Payer: Self-pay | Admitting: Family Medicine

## 2020-03-28 LAB — CULTURE, BLOOD (ROUTINE X 2)
Culture: NO GROWTH
Culture: NO GROWTH

## 2020-03-29 ENCOUNTER — Telehealth: Payer: Self-pay

## 2020-03-29 DIAGNOSIS — E785 Hyperlipidemia, unspecified: Secondary | ICD-10-CM | POA: Diagnosis not present

## 2020-03-29 DIAGNOSIS — H4900 Third [oculomotor] nerve palsy, unspecified eye: Secondary | ICD-10-CM | POA: Diagnosis not present

## 2020-03-29 DIAGNOSIS — I452 Bifascicular block: Secondary | ICD-10-CM | POA: Diagnosis not present

## 2020-03-29 DIAGNOSIS — I480 Paroxysmal atrial fibrillation: Secondary | ICD-10-CM | POA: Diagnosis not present

## 2020-03-29 DIAGNOSIS — D509 Iron deficiency anemia, unspecified: Secondary | ICD-10-CM | POA: Diagnosis not present

## 2020-03-29 DIAGNOSIS — I080 Rheumatic disorders of both mitral and aortic valves: Secondary | ICD-10-CM | POA: Diagnosis not present

## 2020-03-29 DIAGNOSIS — I69354 Hemiplegia and hemiparesis following cerebral infarction affecting left non-dominant side: Secondary | ICD-10-CM | POA: Diagnosis not present

## 2020-03-29 DIAGNOSIS — I2510 Atherosclerotic heart disease of native coronary artery without angina pectoris: Secondary | ICD-10-CM | POA: Diagnosis not present

## 2020-03-29 DIAGNOSIS — I10 Essential (primary) hypertension: Secondary | ICD-10-CM | POA: Diagnosis not present

## 2020-03-29 DIAGNOSIS — J449 Chronic obstructive pulmonary disease, unspecified: Secondary | ICD-10-CM | POA: Diagnosis not present

## 2020-03-29 NOTE — Telephone Encounter (Signed)
Received VM from Sabrina Alvarez with [facility] home health to request verbal orders to continue PT for twice weekly for 3 weeks and once weekly for 1 week. They would also like to complete OT evaluation.  Okay for verbal orders?

## 2020-03-29 NOTE — Telephone Encounter (Signed)
Noted: nurse phone contact with patient for TCM. Signed:  Crissie Sickles, MD           03/29/2020

## 2020-03-29 NOTE — Telephone Encounter (Signed)
Transition Care Management Follow-up Telephone Date of discharge and from where: 03/27/20-[place]  How have you been since you were released from the hospital? Good  Any questions or concerns? No  Items Reviewed:  Did the pt receive and understand the discharge instructions provided? Yes   Medications obtained and verified? Yes   Other? N/A  Any new allergies since your discharge? No   Dietary orders reviewed? Yes  Do you have support at home? Yes   Home Care and Equipment/Supplies: Were home health services ordered? yes If so, what is the name of the agency? [facility]  Has the agency set up a time to come to the patient's home? yes Were any new equipment or medical supplies ordered?  No What is the name of the medical supply agency? N/A Were you able to get the supplies/equipment? not applicable Do you have any questions related to the use of the equipment or supplies? No  Functional Questionnaire: (I = Independent and D = Dependent) ADLs: I-with assistance & a walker  Bathing/Dressing- I with assistance  Meal Prep- D  Eating- I  Maintaining continence- I-with assistance  Transferring/Ambulation- I-with assistance  Managing Meds- D  Follow up appointments reviewed:   PCP Hospital f/u appt confirmed? Yes  Scheduled to see Dr. Anitra Lauth on 04/09/2020 @ 11:30.  [facility] f/u appt confirmed? Yes  Scheduled to see Dr. Darden Dates on 04/26/20 @ 3:15.  Are transportation arrangements needed? No   If their condition worsens, is the pt aware to call PCP or go to the Emergency Dept.? Yes  Was the patient provided with contact information for the PCP's office or ED? Yes  Was to pt encouraged to call back with questions or concerns? Yes

## 2020-03-29 NOTE — Telephone Encounter (Signed)
Yes, okay.

## 2020-03-30 ENCOUNTER — Telehealth: Payer: Self-pay

## 2020-03-30 DIAGNOSIS — I080 Rheumatic disorders of both mitral and aortic valves: Secondary | ICD-10-CM | POA: Diagnosis not present

## 2020-03-30 DIAGNOSIS — I69354 Hemiplegia and hemiparesis following cerebral infarction affecting left non-dominant side: Secondary | ICD-10-CM | POA: Diagnosis not present

## 2020-03-30 DIAGNOSIS — I2510 Atherosclerotic heart disease of native coronary artery without angina pectoris: Secondary | ICD-10-CM | POA: Diagnosis not present

## 2020-03-30 DIAGNOSIS — E785 Hyperlipidemia, unspecified: Secondary | ICD-10-CM | POA: Diagnosis not present

## 2020-03-30 DIAGNOSIS — I10 Essential (primary) hypertension: Secondary | ICD-10-CM | POA: Diagnosis not present

## 2020-03-30 DIAGNOSIS — I452 Bifascicular block: Secondary | ICD-10-CM | POA: Diagnosis not present

## 2020-03-30 DIAGNOSIS — I480 Paroxysmal atrial fibrillation: Secondary | ICD-10-CM | POA: Diagnosis not present

## 2020-03-30 DIAGNOSIS — J449 Chronic obstructive pulmonary disease, unspecified: Secondary | ICD-10-CM | POA: Diagnosis not present

## 2020-03-30 DIAGNOSIS — H4900 Third [oculomotor] nerve palsy, unspecified eye: Secondary | ICD-10-CM | POA: Diagnosis not present

## 2020-03-30 DIAGNOSIS — D509 Iron deficiency anemia, unspecified: Secondary | ICD-10-CM | POA: Diagnosis not present

## 2020-03-30 NOTE — Telephone Encounter (Signed)
Left VM advising ok for PT/OT orders. Office and fax number provided

## 2020-03-30 NOTE — Telephone Encounter (Signed)
Received DME order from [facility] home health for 3-in-1 commode. Placed on PCP desk to review and sign, if appropriate.

## 2020-03-31 ENCOUNTER — Telehealth: Payer: Self-pay | Admitting: Family Medicine

## 2020-03-31 DIAGNOSIS — D509 Iron deficiency anemia, unspecified: Secondary | ICD-10-CM | POA: Diagnosis not present

## 2020-03-31 DIAGNOSIS — I080 Rheumatic disorders of both mitral and aortic valves: Secondary | ICD-10-CM | POA: Diagnosis not present

## 2020-03-31 DIAGNOSIS — I69354 Hemiplegia and hemiparesis following cerebral infarction affecting left non-dominant side: Secondary | ICD-10-CM | POA: Diagnosis not present

## 2020-03-31 DIAGNOSIS — I480 Paroxysmal atrial fibrillation: Secondary | ICD-10-CM | POA: Diagnosis not present

## 2020-03-31 DIAGNOSIS — H4900 Third [oculomotor] nerve palsy, unspecified eye: Secondary | ICD-10-CM | POA: Diagnosis not present

## 2020-03-31 DIAGNOSIS — J449 Chronic obstructive pulmonary disease, unspecified: Secondary | ICD-10-CM | POA: Diagnosis not present

## 2020-03-31 DIAGNOSIS — E785 Hyperlipidemia, unspecified: Secondary | ICD-10-CM | POA: Diagnosis not present

## 2020-03-31 DIAGNOSIS — I10 Essential (primary) hypertension: Secondary | ICD-10-CM | POA: Diagnosis not present

## 2020-03-31 DIAGNOSIS — I452 Bifascicular block: Secondary | ICD-10-CM | POA: Diagnosis not present

## 2020-03-31 DIAGNOSIS — I2510 Atherosclerotic heart disease of native coronary artery without angina pectoris: Secondary | ICD-10-CM | POA: Diagnosis not present

## 2020-03-31 NOTE — Telephone Encounter (Signed)
Patient states she was very constipated and when she finally had a BM, it had some blood in it and Dr. Anitra Lauth had wanted to know if that happened. Also, she has questions about how often to take the Miralax. Is it twice daily? Please call patient to advise.

## 2020-03-31 NOTE — Telephone Encounter (Signed)
It's OK if just blood when has hard/large BM.  Keep taking eliquis.   For miralax, she can use 1 capful 3 times per day.

## 2020-03-31 NOTE — Telephone Encounter (Signed)
Please advise, see Karen's note

## 2020-04-01 ENCOUNTER — Telehealth: Payer: Self-pay | Admitting: Family Medicine

## 2020-04-01 NOTE — Telephone Encounter (Signed)
Form faxed, confirmation received with form and placed in bin for scan.

## 2020-04-01 NOTE — Telephone Encounter (Signed)
All noted.  

## 2020-04-01 NOTE — Telephone Encounter (Signed)
Yes, okay.

## 2020-04-01 NOTE — Telephone Encounter (Signed)
Verbal orders given to [facility] for cognitive communication.

## 2020-04-01 NOTE — Telephone Encounter (Signed)
Sabrina Alvarez with [facility] would like verbal orders for: Cognitive communication weekly x 2 weeks and twice weekly for four weeks. Her call back number is [phone].

## 2020-04-01 NOTE — Telephone Encounter (Signed)
Patient's son, Dellis Filbert advised and voiced understanding.

## 2020-04-01 NOTE — Telephone Encounter (Signed)
Signed and put in box to go up front. Signed:  Phil Denielle Bayard, MD           04/01/2020  

## 2020-04-01 NOTE — Telephone Encounter (Signed)
Okay for orders? 

## 2020-04-02 DIAGNOSIS — D509 Iron deficiency anemia, unspecified: Secondary | ICD-10-CM | POA: Diagnosis not present

## 2020-04-02 DIAGNOSIS — I452 Bifascicular block: Secondary | ICD-10-CM | POA: Diagnosis not present

## 2020-04-02 DIAGNOSIS — I480 Paroxysmal atrial fibrillation: Secondary | ICD-10-CM | POA: Diagnosis not present

## 2020-04-02 DIAGNOSIS — I2510 Atherosclerotic heart disease of native coronary artery without angina pectoris: Secondary | ICD-10-CM | POA: Diagnosis not present

## 2020-04-02 DIAGNOSIS — I10 Essential (primary) hypertension: Secondary | ICD-10-CM | POA: Diagnosis not present

## 2020-04-02 DIAGNOSIS — I69354 Hemiplegia and hemiparesis following cerebral infarction affecting left non-dominant side: Secondary | ICD-10-CM | POA: Diagnosis not present

## 2020-04-02 DIAGNOSIS — H4900 Third [oculomotor] nerve palsy, unspecified eye: Secondary | ICD-10-CM | POA: Diagnosis not present

## 2020-04-02 DIAGNOSIS — J449 Chronic obstructive pulmonary disease, unspecified: Secondary | ICD-10-CM | POA: Diagnosis not present

## 2020-04-02 DIAGNOSIS — E785 Hyperlipidemia, unspecified: Secondary | ICD-10-CM | POA: Diagnosis not present

## 2020-04-02 DIAGNOSIS — I080 Rheumatic disorders of both mitral and aortic valves: Secondary | ICD-10-CM | POA: Diagnosis not present

## 2020-04-05 DIAGNOSIS — J449 Chronic obstructive pulmonary disease, unspecified: Secondary | ICD-10-CM | POA: Diagnosis not present

## 2020-04-05 DIAGNOSIS — E785 Hyperlipidemia, unspecified: Secondary | ICD-10-CM | POA: Diagnosis not present

## 2020-04-05 DIAGNOSIS — I080 Rheumatic disorders of both mitral and aortic valves: Secondary | ICD-10-CM | POA: Diagnosis not present

## 2020-04-05 DIAGNOSIS — H4900 Third [oculomotor] nerve palsy, unspecified eye: Secondary | ICD-10-CM | POA: Diagnosis not present

## 2020-04-05 DIAGNOSIS — I10 Essential (primary) hypertension: Secondary | ICD-10-CM | POA: Diagnosis not present

## 2020-04-05 DIAGNOSIS — I69354 Hemiplegia and hemiparesis following cerebral infarction affecting left non-dominant side: Secondary | ICD-10-CM | POA: Diagnosis not present

## 2020-04-05 DIAGNOSIS — I2510 Atherosclerotic heart disease of native coronary artery without angina pectoris: Secondary | ICD-10-CM | POA: Diagnosis not present

## 2020-04-05 DIAGNOSIS — I452 Bifascicular block: Secondary | ICD-10-CM | POA: Diagnosis not present

## 2020-04-05 DIAGNOSIS — I480 Paroxysmal atrial fibrillation: Secondary | ICD-10-CM | POA: Diagnosis not present

## 2020-04-05 DIAGNOSIS — D509 Iron deficiency anemia, unspecified: Secondary | ICD-10-CM | POA: Diagnosis not present

## 2020-04-06 DIAGNOSIS — I10 Essential (primary) hypertension: Secondary | ICD-10-CM | POA: Diagnosis not present

## 2020-04-06 DIAGNOSIS — H4900 Third [oculomotor] nerve palsy, unspecified eye: Secondary | ICD-10-CM | POA: Diagnosis not present

## 2020-04-06 DIAGNOSIS — I2510 Atherosclerotic heart disease of native coronary artery without angina pectoris: Secondary | ICD-10-CM | POA: Diagnosis not present

## 2020-04-06 DIAGNOSIS — I69354 Hemiplegia and hemiparesis following cerebral infarction affecting left non-dominant side: Secondary | ICD-10-CM | POA: Diagnosis not present

## 2020-04-06 DIAGNOSIS — I452 Bifascicular block: Secondary | ICD-10-CM | POA: Diagnosis not present

## 2020-04-06 DIAGNOSIS — E785 Hyperlipidemia, unspecified: Secondary | ICD-10-CM | POA: Diagnosis not present

## 2020-04-06 DIAGNOSIS — I080 Rheumatic disorders of both mitral and aortic valves: Secondary | ICD-10-CM | POA: Diagnosis not present

## 2020-04-06 DIAGNOSIS — D509 Iron deficiency anemia, unspecified: Secondary | ICD-10-CM | POA: Diagnosis not present

## 2020-04-06 DIAGNOSIS — I480 Paroxysmal atrial fibrillation: Secondary | ICD-10-CM | POA: Diagnosis not present

## 2020-04-06 DIAGNOSIS — J449 Chronic obstructive pulmonary disease, unspecified: Secondary | ICD-10-CM | POA: Diagnosis not present

## 2020-04-07 ENCOUNTER — Other Ambulatory Visit: Payer: Self-pay

## 2020-04-07 DIAGNOSIS — H4900 Third [oculomotor] nerve palsy, unspecified eye: Secondary | ICD-10-CM | POA: Diagnosis not present

## 2020-04-07 DIAGNOSIS — J449 Chronic obstructive pulmonary disease, unspecified: Secondary | ICD-10-CM | POA: Diagnosis not present

## 2020-04-07 DIAGNOSIS — I080 Rheumatic disorders of both mitral and aortic valves: Secondary | ICD-10-CM | POA: Diagnosis not present

## 2020-04-07 DIAGNOSIS — I69354 Hemiplegia and hemiparesis following cerebral infarction affecting left non-dominant side: Secondary | ICD-10-CM | POA: Diagnosis not present

## 2020-04-07 DIAGNOSIS — I2510 Atherosclerotic heart disease of native coronary artery without angina pectoris: Secondary | ICD-10-CM | POA: Diagnosis not present

## 2020-04-07 DIAGNOSIS — D509 Iron deficiency anemia, unspecified: Secondary | ICD-10-CM | POA: Diagnosis not present

## 2020-04-07 DIAGNOSIS — I452 Bifascicular block: Secondary | ICD-10-CM | POA: Diagnosis not present

## 2020-04-07 DIAGNOSIS — I480 Paroxysmal atrial fibrillation: Secondary | ICD-10-CM | POA: Diagnosis not present

## 2020-04-07 DIAGNOSIS — E785 Hyperlipidemia, unspecified: Secondary | ICD-10-CM | POA: Diagnosis not present

## 2020-04-07 DIAGNOSIS — I10 Essential (primary) hypertension: Secondary | ICD-10-CM | POA: Diagnosis not present

## 2020-04-09 ENCOUNTER — Ambulatory Visit (INDEPENDENT_AMBULATORY_CARE_PROVIDER_SITE_OTHER): Payer: Medicare HMO | Admitting: Family Medicine

## 2020-04-09 ENCOUNTER — Other Ambulatory Visit: Payer: Self-pay

## 2020-04-09 ENCOUNTER — Encounter: Payer: Self-pay | Admitting: Family Medicine

## 2020-04-09 VITALS — BP 124/77 | HR 50 | Temp 97.9°F | Resp 16 | Ht 64.0 in | Wt 126.4 lb

## 2020-04-09 DIAGNOSIS — D509 Iron deficiency anemia, unspecified: Secondary | ICD-10-CM | POA: Diagnosis not present

## 2020-04-09 DIAGNOSIS — I480 Paroxysmal atrial fibrillation: Secondary | ICD-10-CM | POA: Diagnosis not present

## 2020-04-09 DIAGNOSIS — R2681 Unsteadiness on feet: Secondary | ICD-10-CM | POA: Diagnosis not present

## 2020-04-09 DIAGNOSIS — I63411 Cerebral infarction due to embolism of right middle cerebral artery: Secondary | ICD-10-CM | POA: Diagnosis not present

## 2020-04-09 DIAGNOSIS — I080 Rheumatic disorders of both mitral and aortic valves: Secondary | ICD-10-CM | POA: Diagnosis not present

## 2020-04-09 DIAGNOSIS — D500 Iron deficiency anemia secondary to blood loss (chronic): Secondary | ICD-10-CM | POA: Diagnosis not present

## 2020-04-09 DIAGNOSIS — I4891 Unspecified atrial fibrillation: Secondary | ICD-10-CM

## 2020-04-09 DIAGNOSIS — I69354 Hemiplegia and hemiparesis following cerebral infarction affecting left non-dominant side: Secondary | ICD-10-CM | POA: Diagnosis not present

## 2020-04-09 DIAGNOSIS — R5381 Other malaise: Secondary | ICD-10-CM

## 2020-04-09 DIAGNOSIS — I10 Essential (primary) hypertension: Secondary | ICD-10-CM | POA: Diagnosis not present

## 2020-04-09 DIAGNOSIS — Z7901 Long term (current) use of anticoagulants: Secondary | ICD-10-CM

## 2020-04-09 DIAGNOSIS — I2510 Atherosclerotic heart disease of native coronary artery without angina pectoris: Secondary | ICD-10-CM | POA: Diagnosis not present

## 2020-04-09 DIAGNOSIS — H4900 Third [oculomotor] nerve palsy, unspecified eye: Secondary | ICD-10-CM | POA: Diagnosis not present

## 2020-04-09 DIAGNOSIS — I452 Bifascicular block: Secondary | ICD-10-CM | POA: Diagnosis not present

## 2020-04-09 DIAGNOSIS — E785 Hyperlipidemia, unspecified: Secondary | ICD-10-CM | POA: Diagnosis not present

## 2020-04-09 DIAGNOSIS — J449 Chronic obstructive pulmonary disease, unspecified: Secondary | ICD-10-CM | POA: Diagnosis not present

## 2020-04-09 NOTE — Progress Notes (Signed)
04/09/2020  CC:  Chief Complaint  Patient presents with  . Hospitalization Follow-up    Patient is a 86 y.o. Caucasian female who presents accompanied by her son for hospital follow up. Dates hospitalized: 10/19-10/23, 2021. Days since d/c from hospital: 13 days Patient was discharged from hospital to home. Reason for admission to hospital: acute L sided weakness.  I have reviewed patient's discharge summary plus pertinent specific notes, labs, and imaging from the hospitalization.   Upon admission for acute L sided weakness she was found to have a new embolic CVA in R motor strip, pt was in a-fib that had just recently been dx'd after having syncopal episode and brief hosp admission ([facility]) on 02/10/20.  (She and family had elected to [facility] anticoagulation at 02/10/20 admission.  Cardiology f/u 02/13/20 (Dr Shirlee More), she was stable and NOT in a-fib, no treatment changes were made, 48H holter ordered. Holter eval delayed b/c of pt's intermittent prob with confusion and when it finally got done a couple weeks later it DID show some PAF/flutter.  No report/results in EMR).  This admission, a CT angio head and neck showed no large or med vessel occlusion.  Echo EF 55-60%.  Started on eliquis, atenolol changed to metoprolol.  Her a-fib rate control was good. Hosp complicated by acute delirium superimposed on her mild dementia. Also had orthostatic syncope and got IVF and compression hose and this helped. She does have hx of hemoccult + iron def anemia w/out sign of overt bleeding (09/2019) so GI recommended obs and oral iron rather than endoscopic eval.  She was d/c'd to home with [facility] nursing, PT, OT.  Family closely involved in her care. Instructed to d/c ASA, losartan, and atenolol. She is on iron pill and PPI.  Currently:  "Feeling pretty good". L arm and leg weakness is MUCH improved, getting HH PT/OT/ST daily.  Denies palpitations, dizziness, falls, CP, SOB, abd pain, or hematochezia.   No nose bleeds or blood in urine.  Has black, formed stool, is taking iron tab daily. Sleeping well. Family has been with her 24/7. No new complaints.  Medication reconciliation was done today and patient is taking meds as recommended by discharging hospitalist/specialist.    PMH:  Past Medical History:  Diagnosis Date  . Arthritis    "think it's osteo; got some in my hands"  . Atrial fibrillation ([place]) 02/10/2020   Dr. Roland Rack, [facility],ASA and BB (stopped her ARB)-in sinus-monitor planned but pt hosp for acute R CVA, eliquis started and pt remained on rate control  . Bifascicular block   . CAD (coronary artery disease)    Cath 2009  Occluded LAD, 40-50% RCA and circ managed medically.  Repeat 06/22/15 no change.  . Chronic constipation    slow transit.  Barium enema to eval for colonic stricture 10/2014 was NORMAL  . COPD (chronic obstructive pulmonary disease) ([place])    Changes noted on CXR 2017  . CVA (cerebral vascular accident) ([place]) 03/2020   L sided weakness; a-fib-->eliquis + rate control  . Dementia ([place])   . GERD (gastroesophageal reflux disease)    (also LPR) Schatzki's ring, s/p dil '97; food impaction 08/2010, on PPI therapy since w/out further sx so no dil performed  . History of herpes zoster 06/2015   right side of chest; presented with chest pain mimicking [place]--cardiac eval showed stable CAD compared to 2009.  Marland Kitchen Hyperkalemia 06/2017   suspected due to increase of losartan from 25mg  bid to 50mg  bid.  Dose lowered  back to 25 mg bid K normalized.  After increase again to 50 qAM and 25 qPM potassium 4.8 so I did not increase dose any further (07/20/17).  . Hyperlipidemia    statin started in hosp for cva 03/2020  . Hypertension    permissive elevated bp ok  . Hyponatremia 10/03/2011   Na 131 on labs 02/2015 at [facility].  Baseline Na 132.  . Iron deficiency anemia 11/30/15; 09/2019   Occult GI bleed: 09/2019 Hb drop to 8.5, iron low, hemoccult +.  Oral iron  started->Dr. Buccini eval->heme neg in his office->obs/follow Hb on oral Fe; order BE if Hb not responding or having ongoing heme+ stool. Then hosp for a-fib + CVA, eliquis started b/c no overt bleeding noted.  . Mixed stress and urge urinary incontinence   . RBBB with left anterior fascicular block    bifascicular block  . Recurrent UTI    Bactrim prophyl, then changed to cefdinir (?med rxn?).  Cipro qd as of 08/2018.  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 Thrombocytopenia ([place]) 02/2013   Plts 114K  . Uterine cancer ([place]) 2000  . Xerostomia 2017/2018   Age related glandular atrophy + med effect (oxybutynin and gabapentin).  ANA and sjogren's panel NEG 06/2017.    PSH:  Past Surgical History:  Procedure Laterality Date  . ABDOMINAL HYSTERECTOMY  2000  . APPENDECTOMY  2000  . CARDIAC CATHETERIZATION N/A 06/22/2015   Stable single vessel CAD, EF normal.  Procedure: Left Heart Cath and Coronary Angiography;  Surgeon: Leonie Man, MD;  Location: [place] CV LAB;  Service: Cardiovascular;  Laterality: N/A;  . CATARACT EXTRACTION W/ INTRAOCULAR LENS  IMPLANT, BILATERAL  1990's  . [place]; 1960  . COLONOSCOPY  04/2001   BE neg 10/2014  . DILATION AND CURETTAGE OF UTERUS    . ESOPHAGOGASTRODUODENOSCOPY  1997   for dysphagia->esoph dilatation done  . HERNIA REPAIR     abdominal "twice"  . LEFT HEART CATHETERIZATION WITH CORONARY ANGIOGRAM N/A 10/04/2011   Procedure: LEFT HEART CATHETERIZATION WITH CORONARY ANGIOGRAM;  Surgeon: Jacolyn Reedy, MD;  Location: [facility] CATH LAB;  Service: Cardiovascular;  Laterality: N/A;  . REPAIR KNEE LIGAMENT  ~ 2008   left  . TONSILLECTOMY     as a child  . TRANSTHORACIC ECHOCARDIOGRAM  05/2013; 02/2020; 03/24/20   02/2020 (new dx a-fib) EF 60-65%, valves ok, no wall motion abnorm. 03/2020, pt in a-fib/flutter EF 55-60%, valves ok,  small peric effus.     MEDS:  Outpatient Medications Prior to Visit  Medication Sig Dispense Refill  . apixaban (ELIQUIS) 2.5 MG TABS tablet Take 1 tablet (2.5 mg total) by mouth 2 (two) times daily. 60 tablet 0  . atorvastatin (LIPITOR) 40 MG tablet Take 1 tablet (40 mg total) by mouth daily. 30 tablet 0  . cevimeline (EVOXAC) 30 MG capsule TAKE 1 CAPSULE 3 TIMES A DAY FOR TREATMENT OF CHRONIC DRY MOUTH (Patient taking differently: Take 30 mg by mouth in the morning and at bedtime. TAKE 1 CAPSULE 3 TIMES A DAY FOR TREATMENT OF CHRONIC DRY MOUTH) 270 capsule 3  . Cholecalciferol (VITAMIN D3) 1000 UNITS CAPS Take 1,000 Units by mouth daily.     . ciprofloxacin (CIPRO) 250 MG tablet TAKE 1 TABLET BY MOUTH EVERY DAY FOR BLADDER INFECTION PROPHYLAXIS. 90 tablet 1  .  FERROUS SULFATE PO Take 1 tablet by mouth daily.     . fluticasone (FLONASE) 50 MCG/ACT nasal spray SPRAY 2 SPRAYS INTO EACH NOSTRIL EVERY DAY (Patient taking differently: Place 1 spray into both nostrils daily as needed for allergies. ) 48 mL 0  . metoprolol tartrate (LOPRESSOR) 25 MG tablet Take 1 tablet (25 mg total) by mouth 2 (two) times daily. 60 tablet 0  . Omega-3 Fatty Acids (FISH OIL) 1000 MG CAPS Take 1 capsule by mouth daily.    Marland Kitchen oxybutynin (DITROPAN) 5 MG tablet 1 tab po every other day (Patient taking differently: Take 5 mg by mouth every other day. 1 tab po every other day) 45 tablet 3  . pantoprazole (PROTONIX) 40 MG tablet Take 1 tablet (40 mg total) by mouth daily. 30 tablet 1  . polyethylene glycol powder (GLYCOLAX/MIRALAX) powder TAKE 17 G BY MOUTH 2 (TWO) TIMES DAILY. 527 g 5  . nitroGLYCERIN (NITROSTAT) 0.4 MG SL tablet Place 1 tablet (0.4 mg total) under the tongue every 5 (five) minutes as needed for chest pain. (Patient not taking: Reported on 04/09/2020) 25 tablet 12  . aspirin 81 MG EC tablet Take by mouth. (Patient not taking: Reported on 04/09/2020)    . atenolol (TENORMIN) 25 MG tablet Take by mouth. (Patient  not taking: Reported on 04/09/2020)    . losartan (COZAAR) 50 MG tablet Take by mouth. (Patient not taking: Reported on 04/09/2020)     No facility-administered medications prior to visit.   EXAM:  Vitals with BMI 04/09/2020 03/27/2020 03/27/2020  Height 5\' 4"  - -  Weight 126 lbs 6 oz - -  BMI 25.36 - -  Systolic 644 034 742  Diastolic 77 66 75  Pulse 50 52 51   Gen: Alert, well appearing.  Patient is oriented to person, place, time, and situation. She can get onto exam table with minimal assistance. AFFECT: pleasant, lucid thought and speech. VZD:GLOV: no injection, icteris, swelling, or exudate.  EOMI, PERRLA. Mouth: lips without lesion/swelling.  Oral mucosa pink and moist. Oropharynx without erythema, exudate, or swelling.  CV: Regular rhythm, rate 56E, soft systolic flow murmur, no r/g.   LUNGS: CTA bilat, nonlabored resps, good aeration in all lung fields. EXT: no clubbing or cyanosis.  Trace R LL pitting, 1+ L LL pitting edema.  Neuro: CN 2-12 intact bilaterally, strength 5/5 in proximal and distal upper extremities and lower extremities bilaterally except just a hint of weakness in L arm.  No sensory deficits.  No tremor.  FNF normal bilat. No ataxia.  Upper extremity and lower extremity DTRs symmetric.  No pronator drift.    Pertinent labs/imaging   Chemistry      Component Value Date/Time   NA 135 03/26/2020 0404   K 3.6 03/26/2020 0404   CL 99 03/26/2020 0404   CO2 24 03/26/2020 0404   BUN 14 03/26/2020 0404   CREATININE 0.70 03/26/2020 0404   CREATININE 0.83 06/29/2017 1425      Component Value Date/Time   CALCIUM 9.1 03/26/2020 0404   ALKPHOS 47 03/23/2020 1600   AST 21 03/23/2020 1600   ALT 13 03/23/2020 1600   BILITOT 0.9 03/23/2020 1600     Lab Results  Component Value Date   WBC 7.3 03/26/2020   HGB 12.9 03/26/2020   HCT 38.9 03/26/2020   MCV 85.3 03/26/2020   PLT 165 03/26/2020   Lab Results  Component Value Date   IRON 64 12/15/2019   TIBC 266  12/15/2019  FERRITIN 33 12/15/2019   Lab Results  Component Value Date   TSH 1.55 11/29/2015    ASSESSMENT/PLAN:  1) Acute R motor strip CVA, L sided weakness. Very minimal L arm weakness at this point, otherwise just generalized weakness and gait instability due to age and deconditioning. Cont home PT/OT/ST. Cont round the clock family supervision as long as they are able. Continue eliquis 2.5mg  bid and metoprolol 25mg  bid.   Has f/u with [facility] 04/26/20.  2) PAF: Sinus brady at this time. Asymptomatic. Continue eliquis and lopressor as per #1 above.  BMET today. She has appt with EP MD 04/12/20---initial appt.  3) HTN: bp good on lopressor 25mg  bid, OFF losartan. Hx of orthostatic dizziness/syncope--->permissive htn approach since high fall risk and on anticoagulant.  4) Hx of occult GIB, IDA. No sign of overt bleeding but watching close since on eliquis. Cont oral iron. CBC monitoring today.  FOLLOW UP:  1 mo  Signed:  Crissie Sickles, MD           04/09/2020

## 2020-04-10 LAB — BASIC METABOLIC PANEL
BUN: 16 mg/dL (ref 7–25)
CO2: 29 mmol/L (ref 20–32)
Calcium: 8.4 mg/dL — ABNORMAL LOW (ref 8.6–10.4)
Chloride: 99 mmol/L (ref 98–110)
Creat: 0.6 mg/dL (ref 0.60–0.88)
Glucose, Bld: 89 mg/dL (ref 65–99)
Potassium: 4.2 mmol/L (ref 3.5–5.3)
Sodium: 135 mmol/L (ref 135–146)

## 2020-04-10 LAB — CBC
HCT: 33.9 % — ABNORMAL LOW (ref 35.0–45.0)
Hemoglobin: 11.1 g/dL — ABNORMAL LOW (ref 11.7–15.5)
MCH: 27.8 pg (ref 27.0–33.0)
MCHC: 32.7 g/dL (ref 32.0–36.0)
MCV: 85 fL (ref 80.0–100.0)
MPV: 11.6 fL (ref 7.5–12.5)
Platelets: 277 10*3/uL (ref 140–400)
RBC: 3.99 10*6/uL (ref 3.80–5.10)
RDW: 12.9 % (ref 11.0–15.0)
WBC: 5.7 10*3/uL (ref 3.8–10.8)

## 2020-04-12 ENCOUNTER — Other Ambulatory Visit: Payer: Self-pay

## 2020-04-12 DIAGNOSIS — R55 Syncope and collapse: Secondary | ICD-10-CM | POA: Diagnosis not present

## 2020-04-12 DIAGNOSIS — I639 Cerebral infarction, unspecified: Secondary | ICD-10-CM | POA: Diagnosis not present

## 2020-04-12 DIAGNOSIS — I4892 Unspecified atrial flutter: Secondary | ICD-10-CM | POA: Diagnosis not present

## 2020-04-12 DIAGNOSIS — D500 Iron deficiency anemia secondary to blood loss (chronic): Secondary | ICD-10-CM

## 2020-04-12 DIAGNOSIS — Z7901 Long term (current) use of anticoagulants: Secondary | ICD-10-CM

## 2020-04-12 DIAGNOSIS — D649 Anemia, unspecified: Secondary | ICD-10-CM | POA: Diagnosis not present

## 2020-04-13 ENCOUNTER — Telehealth: Payer: Self-pay

## 2020-04-13 DIAGNOSIS — I452 Bifascicular block: Secondary | ICD-10-CM | POA: Diagnosis not present

## 2020-04-13 DIAGNOSIS — E785 Hyperlipidemia, unspecified: Secondary | ICD-10-CM | POA: Diagnosis not present

## 2020-04-13 DIAGNOSIS — I080 Rheumatic disorders of both mitral and aortic valves: Secondary | ICD-10-CM | POA: Diagnosis not present

## 2020-04-13 DIAGNOSIS — H4900 Third [oculomotor] nerve palsy, unspecified eye: Secondary | ICD-10-CM | POA: Diagnosis not present

## 2020-04-13 DIAGNOSIS — J449 Chronic obstructive pulmonary disease, unspecified: Secondary | ICD-10-CM | POA: Diagnosis not present

## 2020-04-13 DIAGNOSIS — I10 Essential (primary) hypertension: Secondary | ICD-10-CM | POA: Diagnosis not present

## 2020-04-13 DIAGNOSIS — I69354 Hemiplegia and hemiparesis following cerebral infarction affecting left non-dominant side: Secondary | ICD-10-CM | POA: Diagnosis not present

## 2020-04-13 DIAGNOSIS — I480 Paroxysmal atrial fibrillation: Secondary | ICD-10-CM | POA: Diagnosis not present

## 2020-04-13 DIAGNOSIS — D509 Iron deficiency anemia, unspecified: Secondary | ICD-10-CM | POA: Diagnosis not present

## 2020-04-13 DIAGNOSIS — I2510 Atherosclerotic heart disease of native coronary artery without angina pectoris: Secondary | ICD-10-CM | POA: Diagnosis not present

## 2020-04-13 NOTE — Telephone Encounter (Signed)
Pamala Hurry with [facility].  She is therapist that sees patient. She wanted to inform Dr. Anitra Lauth patients' blood pressure since it was elevated today during her visit. Informed her unlikely to get call back today.  She said no call back needed, she was just informing patient's PCP.  Sabrina Alvarez blood pressure was  168/71   Pamala Hurry can be reached at [phone] for more information.  Please leave voicemail.

## 2020-04-14 DIAGNOSIS — I480 Paroxysmal atrial fibrillation: Secondary | ICD-10-CM | POA: Diagnosis not present

## 2020-04-14 DIAGNOSIS — I452 Bifascicular block: Secondary | ICD-10-CM | POA: Diagnosis not present

## 2020-04-14 DIAGNOSIS — I69354 Hemiplegia and hemiparesis following cerebral infarction affecting left non-dominant side: Secondary | ICD-10-CM | POA: Diagnosis not present

## 2020-04-14 DIAGNOSIS — E785 Hyperlipidemia, unspecified: Secondary | ICD-10-CM | POA: Diagnosis not present

## 2020-04-14 DIAGNOSIS — J449 Chronic obstructive pulmonary disease, unspecified: Secondary | ICD-10-CM | POA: Diagnosis not present

## 2020-04-14 DIAGNOSIS — I2510 Atherosclerotic heart disease of native coronary artery without angina pectoris: Secondary | ICD-10-CM | POA: Diagnosis not present

## 2020-04-14 DIAGNOSIS — I080 Rheumatic disorders of both mitral and aortic valves: Secondary | ICD-10-CM | POA: Diagnosis not present

## 2020-04-14 DIAGNOSIS — I10 Essential (primary) hypertension: Secondary | ICD-10-CM | POA: Diagnosis not present

## 2020-04-14 DIAGNOSIS — D509 Iron deficiency anemia, unspecified: Secondary | ICD-10-CM | POA: Diagnosis not present

## 2020-04-14 DIAGNOSIS — H4900 Third [oculomotor] nerve palsy, unspecified eye: Secondary | ICD-10-CM | POA: Diagnosis not present

## 2020-04-14 NOTE — Telephone Encounter (Signed)
OK, noted. No new recommendations.

## 2020-04-14 NOTE — Telephone Encounter (Signed)
Just an FYI on pt BP

## 2020-04-15 DIAGNOSIS — I10 Essential (primary) hypertension: Secondary | ICD-10-CM | POA: Diagnosis not present

## 2020-04-15 DIAGNOSIS — I080 Rheumatic disorders of both mitral and aortic valves: Secondary | ICD-10-CM | POA: Diagnosis not present

## 2020-04-15 DIAGNOSIS — H4900 Third [oculomotor] nerve palsy, unspecified eye: Secondary | ICD-10-CM | POA: Diagnosis not present

## 2020-04-15 DIAGNOSIS — D509 Iron deficiency anemia, unspecified: Secondary | ICD-10-CM | POA: Diagnosis not present

## 2020-04-15 DIAGNOSIS — I69354 Hemiplegia and hemiparesis following cerebral infarction affecting left non-dominant side: Secondary | ICD-10-CM | POA: Diagnosis not present

## 2020-04-15 DIAGNOSIS — J449 Chronic obstructive pulmonary disease, unspecified: Secondary | ICD-10-CM | POA: Diagnosis not present

## 2020-04-15 DIAGNOSIS — I452 Bifascicular block: Secondary | ICD-10-CM | POA: Diagnosis not present

## 2020-04-15 DIAGNOSIS — E785 Hyperlipidemia, unspecified: Secondary | ICD-10-CM | POA: Diagnosis not present

## 2020-04-15 DIAGNOSIS — I480 Paroxysmal atrial fibrillation: Secondary | ICD-10-CM | POA: Diagnosis not present

## 2020-04-15 DIAGNOSIS — I2510 Atherosclerotic heart disease of native coronary artery without angina pectoris: Secondary | ICD-10-CM | POA: Diagnosis not present

## 2020-04-16 ENCOUNTER — Other Ambulatory Visit: Payer: Self-pay | Admitting: Family Medicine

## 2020-04-16 ENCOUNTER — Telehealth: Payer: Self-pay

## 2020-04-16 DIAGNOSIS — I480 Paroxysmal atrial fibrillation: Secondary | ICD-10-CM | POA: Diagnosis not present

## 2020-04-16 DIAGNOSIS — I2510 Atherosclerotic heart disease of native coronary artery without angina pectoris: Secondary | ICD-10-CM | POA: Diagnosis not present

## 2020-04-16 DIAGNOSIS — J449 Chronic obstructive pulmonary disease, unspecified: Secondary | ICD-10-CM | POA: Diagnosis not present

## 2020-04-16 DIAGNOSIS — I10 Essential (primary) hypertension: Secondary | ICD-10-CM | POA: Diagnosis not present

## 2020-04-16 DIAGNOSIS — E785 Hyperlipidemia, unspecified: Secondary | ICD-10-CM | POA: Diagnosis not present

## 2020-04-16 DIAGNOSIS — I452 Bifascicular block: Secondary | ICD-10-CM | POA: Diagnosis not present

## 2020-04-16 DIAGNOSIS — I69354 Hemiplegia and hemiparesis following cerebral infarction affecting left non-dominant side: Secondary | ICD-10-CM | POA: Diagnosis not present

## 2020-04-16 DIAGNOSIS — I080 Rheumatic disorders of both mitral and aortic valves: Secondary | ICD-10-CM | POA: Diagnosis not present

## 2020-04-16 DIAGNOSIS — H4900 Third [oculomotor] nerve palsy, unspecified eye: Secondary | ICD-10-CM | POA: Diagnosis not present

## 2020-04-16 DIAGNOSIS — D509 Iron deficiency anemia, unspecified: Secondary | ICD-10-CM | POA: Diagnosis not present

## 2020-04-16 MED ORDER — LOSARTAN POTASSIUM 50 MG PO TABS: 50.0000 mg | ORAL_TABLET | Freq: Every day | ORAL | 1 refills | Status: DC

## 2020-04-16 NOTE — Telephone Encounter (Signed)
Son Dellis Filbert [facility]) called regarding patient's elevated blood pressure reading today.  He is concerned because her having a stroke a few weeks.  He also stated that she used to take a blood pressure med --- Losartan and she was taken off this med and not sure why.  Her blood pressure has been high the last weeks when home health nurse has checked it.   Blood pressure today is  201 / 90.  Dellis Filbert can be reached at [phone].

## 2020-04-16 NOTE — Telephone Encounter (Signed)
I'll send in losartan for her to restart.  Check bp 1-2 times per day over the weekend and call with report early next week.-thx

## 2020-04-16 NOTE — Telephone Encounter (Signed)
Patient's son, Sabrina Alvarez advised and voiced understanding.

## 2020-04-16 NOTE — Telephone Encounter (Signed)
Please advise, thanks.

## 2020-04-19 ENCOUNTER — Telehealth: Payer: Self-pay

## 2020-04-19 ENCOUNTER — Ambulatory Visit (INDEPENDENT_AMBULATORY_CARE_PROVIDER_SITE_OTHER): Payer: Medicare HMO

## 2020-04-19 ENCOUNTER — Other Ambulatory Visit: Payer: Self-pay

## 2020-04-19 DIAGNOSIS — D500 Iron deficiency anemia secondary to blood loss (chronic): Secondary | ICD-10-CM | POA: Diagnosis not present

## 2020-04-19 DIAGNOSIS — Z7901 Long term (current) use of anticoagulants: Secondary | ICD-10-CM | POA: Diagnosis not present

## 2020-04-19 LAB — CBC
HCT: 37 % (ref 36.0–46.0)
Hemoglobin: 12.2 g/dL (ref 12.0–15.0)
MCHC: 33.1 g/dL (ref 30.0–36.0)
MCV: 85.4 fl (ref 78.0–100.0)
Platelets: 201 10*3/uL (ref 150.0–400.0)
RBC: 4.33 Mil/uL (ref 3.87–5.11)
RDW: 15 % (ref 11.5–15.5)
WBC: 6.9 10*3/uL (ref 4.0–10.5)

## 2020-04-19 NOTE — Telephone Encounter (Signed)
Pt came in office for lab draw with son. Son requested that BP be taken because the equipment that he has is old and wanted to see if pt blood pressure was around the same as he recorded earlier in the day. Blood pressure was 190/82 manually taken. Told pt and son (DPR) to continue to take medication as prescribed and continue to monitor. If any changes are made, we will call to notify of changes.

## 2020-04-19 NOTE — Telephone Encounter (Signed)
Spoke with patient's son, Jeff(DPR) advised of recommendations. Appt scheduled for 11/24 at 4pm in office with provider verbal approval.

## 2020-04-19 NOTE — Telephone Encounter (Signed)
Noted. Increase losartan to TWO of the 50mg  tabs daily. Continue daily bp monitoring. Office visit to f/u bp in 1 wk.

## 2020-04-20 ENCOUNTER — Other Ambulatory Visit: Payer: Self-pay | Admitting: Family Medicine

## 2020-04-20 ENCOUNTER — Telehealth: Payer: Self-pay

## 2020-04-20 DIAGNOSIS — E785 Hyperlipidemia, unspecified: Secondary | ICD-10-CM | POA: Diagnosis not present

## 2020-04-20 DIAGNOSIS — I10 Essential (primary) hypertension: Secondary | ICD-10-CM | POA: Diagnosis not present

## 2020-04-20 DIAGNOSIS — D509 Iron deficiency anemia, unspecified: Secondary | ICD-10-CM | POA: Diagnosis not present

## 2020-04-20 DIAGNOSIS — J449 Chronic obstructive pulmonary disease, unspecified: Secondary | ICD-10-CM | POA: Diagnosis not present

## 2020-04-20 DIAGNOSIS — I480 Paroxysmal atrial fibrillation: Secondary | ICD-10-CM | POA: Diagnosis not present

## 2020-04-20 DIAGNOSIS — I080 Rheumatic disorders of both mitral and aortic valves: Secondary | ICD-10-CM | POA: Diagnosis not present

## 2020-04-20 DIAGNOSIS — I452 Bifascicular block: Secondary | ICD-10-CM | POA: Diagnosis not present

## 2020-04-20 DIAGNOSIS — I2510 Atherosclerotic heart disease of native coronary artery without angina pectoris: Secondary | ICD-10-CM | POA: Diagnosis not present

## 2020-04-20 DIAGNOSIS — H4900 Third [oculomotor] nerve palsy, unspecified eye: Secondary | ICD-10-CM | POA: Diagnosis not present

## 2020-04-20 DIAGNOSIS — I69354 Hemiplegia and hemiparesis following cerebral infarction affecting left non-dominant side: Secondary | ICD-10-CM | POA: Diagnosis not present

## 2020-04-20 NOTE — Telephone Encounter (Signed)
Alternative medication needed per pharmacy.

## 2020-04-20 NOTE — Telephone Encounter (Signed)
This is not an appropriate medication switch. She needs to stay on the cipro 250mg  qd. Why is the pharmacy wanting to switch her to macrobid?

## 2020-04-20 NOTE — Telephone Encounter (Signed)
Spoke with Sabrina Alvarez at [place] and the medication is on back order with all [facility]. Since the patient lives in [place], called local [facility] to see if medication in stock. Confirmed availability and currently pending for updated location.  Please advise, thanks.

## 2020-04-20 NOTE — Telephone Encounter (Signed)
Received vital sign report from [facility], Placed on PCP desk to review and sign, if appropriate.

## 2020-04-21 DIAGNOSIS — I10 Essential (primary) hypertension: Secondary | ICD-10-CM | POA: Diagnosis not present

## 2020-04-21 DIAGNOSIS — D509 Iron deficiency anemia, unspecified: Secondary | ICD-10-CM | POA: Diagnosis not present

## 2020-04-21 DIAGNOSIS — J449 Chronic obstructive pulmonary disease, unspecified: Secondary | ICD-10-CM | POA: Diagnosis not present

## 2020-04-21 DIAGNOSIS — I69354 Hemiplegia and hemiparesis following cerebral infarction affecting left non-dominant side: Secondary | ICD-10-CM | POA: Diagnosis not present

## 2020-04-21 DIAGNOSIS — H4900 Third [oculomotor] nerve palsy, unspecified eye: Secondary | ICD-10-CM | POA: Diagnosis not present

## 2020-04-21 DIAGNOSIS — I080 Rheumatic disorders of both mitral and aortic valves: Secondary | ICD-10-CM | POA: Diagnosis not present

## 2020-04-21 DIAGNOSIS — I2510 Atherosclerotic heart disease of native coronary artery without angina pectoris: Secondary | ICD-10-CM | POA: Diagnosis not present

## 2020-04-21 DIAGNOSIS — E785 Hyperlipidemia, unspecified: Secondary | ICD-10-CM | POA: Diagnosis not present

## 2020-04-21 DIAGNOSIS — I452 Bifascicular block: Secondary | ICD-10-CM | POA: Diagnosis not present

## 2020-04-21 DIAGNOSIS — I480 Paroxysmal atrial fibrillation: Secondary | ICD-10-CM | POA: Diagnosis not present

## 2020-04-21 MED ORDER — CIPROFLOXACIN HCL 250 MG PO TABS
ORAL_TABLET | ORAL | 1 refills | Status: DC
Start: 2020-04-21 — End: 2020-11-05

## 2020-04-21 NOTE — Telephone Encounter (Signed)
Reviewed/noted

## 2020-04-23 ENCOUNTER — Telehealth: Payer: Self-pay

## 2020-04-23 DIAGNOSIS — I480 Paroxysmal atrial fibrillation: Secondary | ICD-10-CM | POA: Diagnosis not present

## 2020-04-23 DIAGNOSIS — D509 Iron deficiency anemia, unspecified: Secondary | ICD-10-CM | POA: Diagnosis not present

## 2020-04-23 DIAGNOSIS — I2510 Atherosclerotic heart disease of native coronary artery without angina pectoris: Secondary | ICD-10-CM | POA: Diagnosis not present

## 2020-04-23 DIAGNOSIS — I452 Bifascicular block: Secondary | ICD-10-CM | POA: Diagnosis not present

## 2020-04-23 DIAGNOSIS — H4900 Third [oculomotor] nerve palsy, unspecified eye: Secondary | ICD-10-CM | POA: Diagnosis not present

## 2020-04-23 DIAGNOSIS — J449 Chronic obstructive pulmonary disease, unspecified: Secondary | ICD-10-CM | POA: Diagnosis not present

## 2020-04-23 DIAGNOSIS — I69354 Hemiplegia and hemiparesis following cerebral infarction affecting left non-dominant side: Secondary | ICD-10-CM | POA: Diagnosis not present

## 2020-04-23 DIAGNOSIS — I10 Essential (primary) hypertension: Secondary | ICD-10-CM | POA: Diagnosis not present

## 2020-04-23 DIAGNOSIS — E785 Hyperlipidemia, unspecified: Secondary | ICD-10-CM | POA: Diagnosis not present

## 2020-04-23 DIAGNOSIS — I4892 Unspecified atrial flutter: Secondary | ICD-10-CM | POA: Insufficient documentation

## 2020-04-23 DIAGNOSIS — I080 Rheumatic disorders of both mitral and aortic valves: Secondary | ICD-10-CM | POA: Diagnosis not present

## 2020-04-23 NOTE — Telephone Encounter (Signed)
OK. Pt should be taking a total of 100mg  of losartan daily (50mg  twice a day OR both 50mg  tabs at the same time, either one is fine). Pls verify that she is doing this (preferably talk to pt's son)-thx

## 2020-04-23 NOTE — Telephone Encounter (Signed)
Barbara from [facility] calling to give blood pressure reading to Dr. Anitra Lauth.  Pamala Hurry can be reached at [phone].  [place]'s blood pressure 183/86  Thank you

## 2020-04-23 NOTE — Telephone Encounter (Signed)
FYI

## 2020-04-24 NOTE — Telephone Encounter (Signed)
Verified with pt's son that she is taking both 50mg  tabs at the same time.

## 2020-04-25 NOTE — Telephone Encounter (Signed)
No changes recommended at this time. Continue monitoring. I'll be seeing her in office 11/24.

## 2020-04-26 ENCOUNTER — Inpatient Hospital Stay: Payer: Medicare HMO | Admitting: Adult Health

## 2020-04-26 ENCOUNTER — Other Ambulatory Visit: Payer: Self-pay

## 2020-04-26 DIAGNOSIS — I080 Rheumatic disorders of both mitral and aortic valves: Secondary | ICD-10-CM | POA: Diagnosis not present

## 2020-04-26 DIAGNOSIS — I69354 Hemiplegia and hemiparesis following cerebral infarction affecting left non-dominant side: Secondary | ICD-10-CM | POA: Diagnosis not present

## 2020-04-26 DIAGNOSIS — D509 Iron deficiency anemia, unspecified: Secondary | ICD-10-CM | POA: Diagnosis not present

## 2020-04-26 DIAGNOSIS — I2510 Atherosclerotic heart disease of native coronary artery without angina pectoris: Secondary | ICD-10-CM | POA: Diagnosis not present

## 2020-04-26 DIAGNOSIS — I452 Bifascicular block: Secondary | ICD-10-CM | POA: Diagnosis not present

## 2020-04-26 DIAGNOSIS — J449 Chronic obstructive pulmonary disease, unspecified: Secondary | ICD-10-CM | POA: Diagnosis not present

## 2020-04-26 DIAGNOSIS — E785 Hyperlipidemia, unspecified: Secondary | ICD-10-CM | POA: Diagnosis not present

## 2020-04-26 DIAGNOSIS — I10 Essential (primary) hypertension: Secondary | ICD-10-CM | POA: Diagnosis not present

## 2020-04-26 DIAGNOSIS — H4900 Third [oculomotor] nerve palsy, unspecified eye: Secondary | ICD-10-CM | POA: Diagnosis not present

## 2020-04-26 DIAGNOSIS — I480 Paroxysmal atrial fibrillation: Secondary | ICD-10-CM | POA: Diagnosis not present

## 2020-04-26 NOTE — Telephone Encounter (Signed)
Spoke with patient's son(Jeff) and information given. Okay per [facility]

## 2020-04-28 ENCOUNTER — Ambulatory Visit (INDEPENDENT_AMBULATORY_CARE_PROVIDER_SITE_OTHER): Payer: Medicare HMO | Admitting: Family Medicine

## 2020-04-28 ENCOUNTER — Telehealth: Payer: Self-pay | Admitting: Family Medicine

## 2020-04-28 ENCOUNTER — Encounter: Payer: Self-pay | Admitting: Family Medicine

## 2020-04-28 ENCOUNTER — Other Ambulatory Visit: Payer: Self-pay

## 2020-04-28 VITALS — BP 185/71 | HR 50 | Temp 97.7°F | Resp 16 | Ht 64.0 in | Wt 126.0 lb

## 2020-04-28 DIAGNOSIS — H4900 Third [oculomotor] nerve palsy, unspecified eye: Secondary | ICD-10-CM | POA: Diagnosis not present

## 2020-04-28 DIAGNOSIS — Z8639 Personal history of other endocrine, nutritional and metabolic disease: Secondary | ICD-10-CM

## 2020-04-28 DIAGNOSIS — I2510 Atherosclerotic heart disease of native coronary artery without angina pectoris: Secondary | ICD-10-CM | POA: Diagnosis not present

## 2020-04-28 DIAGNOSIS — I480 Paroxysmal atrial fibrillation: Secondary | ICD-10-CM | POA: Diagnosis not present

## 2020-04-28 DIAGNOSIS — I69354 Hemiplegia and hemiparesis following cerebral infarction affecting left non-dominant side: Secondary | ICD-10-CM | POA: Diagnosis not present

## 2020-04-28 DIAGNOSIS — D509 Iron deficiency anemia, unspecified: Secondary | ICD-10-CM | POA: Diagnosis not present

## 2020-04-28 DIAGNOSIS — I452 Bifascicular block: Secondary | ICD-10-CM | POA: Diagnosis not present

## 2020-04-28 DIAGNOSIS — I4892 Unspecified atrial flutter: Secondary | ICD-10-CM

## 2020-04-28 DIAGNOSIS — J449 Chronic obstructive pulmonary disease, unspecified: Secondary | ICD-10-CM | POA: Diagnosis not present

## 2020-04-28 DIAGNOSIS — I10 Essential (primary) hypertension: Secondary | ICD-10-CM

## 2020-04-28 DIAGNOSIS — Z8673 Personal history of transient ischemic attack (TIA), and cerebral infarction without residual deficits: Secondary | ICD-10-CM

## 2020-04-28 DIAGNOSIS — I080 Rheumatic disorders of both mitral and aortic valves: Secondary | ICD-10-CM | POA: Diagnosis not present

## 2020-04-28 DIAGNOSIS — E785 Hyperlipidemia, unspecified: Secondary | ICD-10-CM | POA: Diagnosis not present

## 2020-04-28 MED ORDER — LOSARTAN POTASSIUM 50 MG PO TABS
ORAL_TABLET | ORAL | 1 refills | Status: DC
Start: 2020-04-28 — End: 2020-06-15

## 2020-04-28 MED ORDER — CLONIDINE 0.1 MG/24HR TD PTWK: 0.1000 mg | MEDICATED_PATCH | TRANSDERMAL | 1 refills | Status: DC

## 2020-04-28 NOTE — Telephone Encounter (Signed)
Barbara with [facility] called to report patient's BP today at 181/87 with pulse 48. Patient has upcoming appt today at 4:00.

## 2020-04-28 NOTE — Progress Notes (Signed)
OFFICE VISIT  04/28/2020  CC:  Chief Complaint  Patient presents with  . Follow-up    hypertension, pt is not fasting   HPI:    Patient is a 86 y.o. Caucasian female who presents accompanied by her son for 3 wk f/u uncontrolled HTN. A/P as of last visit: "1) Acute R motor strip CVA, L sided weakness. Very minimal L arm weakness at this point, otherwise just generalized weakness and gait instability due to age and deconditioning. Cont home PT/OT/ST. Cont round the clock family supervision as long as they are able. Continue eliquis 2.5mg  bid and metoprolol 25mg  bid.   Has f/u with [facility] 04/26/20.  2) PAF: Sinus brady at this time. Asymptomatic. Continue eliquis and lopressor as per #1 above.  BMET today. She has appt with EP MD 04/12/20---initial appt.  3) HTN: bp good on lopressor 25mg  bid, OFF losartan. Hx of orthostatic dizziness/syncope--->permissive htn approach since high fall risk and on anticoagulant.  4) Hx of occult GIB, IDA. No sign of overt bleeding but watching close since on eliquis. Cont oral iron. CBC monitoring today."  INTERIM HX: BP's were up at home so I restarted her losartan on 11/12 and increased to 100mg  qd dosing on 11/16. Most recent bp at home (yest) was 181/87, P 48. Feels fine. No HAs, no dizziness, no focal weakness.  No palpitations.  No falls. Eating and drinking fine.  PT finishes up this week.  She feels steady as long as she uses a walker.     Past Medical History:  Diagnosis Date  . Arthritis    "think it's osteo; got some in my hands"  . Atrial fibrillation ([place]) 02/10/2020   Dr. Roland Rack, [employer] cards-[place],ASA and BB (stopped her ARB)-in sinus-monitor planned but pt hosp for acute R CVA, eliquis started and pt remained on rate control  . Bifascicular block   . CAD (coronary artery disease)    Cath 2009  Occluded LAD, 40-50% RCA and circ managed medically.  Repeat 06/22/15 no change.  . Chronic constipation     slow transit.  Barium enema to eval for colonic stricture 10/2014 was NORMAL  . COPD (chronic obstructive pulmonary disease) ([place])    Changes noted on CXR 2017  . CVA (cerebral vascular accident) ([place]) 03/2020   L sided weakness; a-fib-->eliquis + rate control  . Dementia ([place])   . GERD (gastroesophageal reflux disease)    (also LPR) Schatzki's ring, s/p dil '97; food impaction 08/2010, on PPI therapy since w/out further sx so no dil performed  . History of herpes zoster 06/2015   right side of chest; presented with chest pain mimicking USA--cardiac eval showed stable CAD compared to 2009.  Marland Kitchen Hyperkalemia 06/2017   suspected due to increase of losartan from 25mg  bid to 50mg  bid.  Dose lowered back to 25 mg bid K normalized.  After increase again to 50 qAM and 25 qPM potassium 4.8 so I did not increase dose any further (07/20/17).  . Hyperlipidemia    statin started in hosp for cva 03/2020  . Hypertension    permissive elevated bp ok  . Hyponatremia 10/03/2011   Na 131 on labs 02/2015 at [facility].  Baseline Na 132.  . Iron deficiency anemia 11/30/15; 09/2019   Occult GI bleed: 09/2019 Hb drop to 8.5, iron low, hemoccult +.  Oral iron started->Dr. Buccini eval->heme neg in his office->obs/follow Hb on oral Fe; order BE if Hb not responding or having ongoing heme+ stool. Then  hosp for a-fib + CVA, eliquis started b/c no overt bleeding noted.  . Mixed stress and urge urinary incontinence   . RBBB with left anterior fascicular block    bifascicular block  . Recurrent UTI    Bactrim prophyl, then changed to cefdinir (?med rxn?).  Cipro qd as of 08/2018.  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 Thrombocytopenia ([place]) 02/2013   Plts 114K  . Uterine cancer ([place]) 2000  . Xerostomia 2017/2018   Age related glandular atrophy + med effect (oxybutynin and gabapentin).  ANA and sjogren's panel NEG  06/2017.    Past Surgical History:  Procedure Laterality Date  . ABDOMINAL HYSTERECTOMY  2000  . APPENDECTOMY  2000  . CARDIAC CATHETERIZATION N/A 06/22/2015   Stable single vessel CAD, EF normal.  Procedure: Left Heart Cath and Coronary Angiography;  Surgeon: Leonie Man, MD;  Location: [facility];  Service: Cardiovascular;  Laterality: N/A;  . CATARACT EXTRACTION W/ INTRAOCULAR LENS  IMPLANT, BILATERAL  1990's  . [place]; 1960  . COLONOSCOPY  04/2001   BE neg 10/2014  . DILATION AND CURETTAGE OF UTERUS    . ESOPHAGOGASTRODUODENOSCOPY  1997   for dysphagia->esoph dilatation done  . HERNIA REPAIR     abdominal "twice"  . LEFT HEART CATHETERIZATION WITH CORONARY ANGIOGRAM N/A 10/04/2011   Procedure: LEFT HEART CATHETERIZATION WITH CORONARY ANGIOGRAM;  Surgeon: Jacolyn Reedy, MD;  Location: [facility] CATH LAB;  Service: Cardiovascular;  Laterality: N/A;  . REPAIR KNEE LIGAMENT  ~ 2008   left  . TONSILLECTOMY     as a child  . TRANSTHORACIC ECHOCARDIOGRAM  05/2013; 02/2020; 03/24/20   02/2020 (new dx a-fib) EF 60-65%, valves ok, no wall motion abnorm. 03/2020, pt in a-fib/flutter EF 55-60%, valves ok, small peric effus.     Outpatient Medications Prior to Visit  Medication Sig Dispense Refill  . amiodarone (PACERONE) 200 MG tablet Take 200 mg by mouth daily.    Marland Kitchen apixaban (ELIQUIS) 2.5 MG TABS tablet Take 1 tablet (2.5 mg total) by mouth 2 (two) times daily. 60 tablet 0  . atorvastatin (LIPITOR) 40 MG tablet Take 1 tablet (40 mg total) by mouth daily. 30 tablet 0  . cevimeline (EVOXAC) 30 MG capsule TAKE 1 CAPSULE 3 TIMES A DAY FOR TREATMENT OF CHRONIC DRY MOUTH (Patient taking differently: Take 30 mg by mouth in the morning and at bedtime. TAKE 1 CAPSULE 3 TIMES A DAY FOR TREATMENT OF CHRONIC DRY MOUTH) 270 capsule 3  . Cholecalciferol (VITAMIN D3) 1000 UNITS CAPS Take 1,000 Units by mouth daily.     . ciprofloxacin (CIPRO) 250 MG tablet TAKE 1 TABLET BY MOUTH EVERY  DAY FOR BLADDER INFECTION PROPHYLAXIS. 90 tablet 1  . FERROUS SULFATE PO Take 1 tablet by mouth daily.     . fluticasone (FLONASE) 50 MCG/ACT nasal spray SPRAY 2 SPRAYS INTO EACH NOSTRIL EVERY DAY (Patient taking differently: Place 1 spray into both nostrils daily as needed for allergies. ) 48 mL 0  . metoprolol tartrate (LOPRESSOR) 25 MG tablet Take 1 tablet (25 mg total) by mouth 2 (two) times daily. 60 tablet 0  . Omega-3 Fatty Acids (FISH OIL) 1000 MG CAPS Take 1 capsule by mouth daily.    Marland Kitchen oxybutynin (DITROPAN) 5 MG tablet 1 tab po every other day (Patient taking differently: Take 5  mg by mouth every other day. 1 tab po every other day) 45 tablet 3  . pantoprazole (PROTONIX) 40 MG tablet Take 1 tablet (40 mg total) by mouth daily. 30 tablet 1  . polyethylene glycol powder (GLYCOLAX/MIRALAX) powder TAKE 17 G BY MOUTH 2 (TWO) TIMES DAILY. 527 g 5  . losartan (COZAAR) 50 MG tablet TAKE 1/2 TABLET (25 MG TOTAL) BY MOUTH 2 (TWO) TIMES DAILY. 90 tablet 1  . nitroGLYCERIN (NITROSTAT) 0.4 MG SL tablet Place 1 tablet (0.4 mg total) under the tongue every 5 (five) minutes as needed for chest pain. (Patient not taking: Reported on 04/09/2020) 25 tablet 12   No facility-administered medications prior to visit.    Allergies  Allergen Reactions  . Cefdinir Hives  . Macrobid [Nitrofurantoin Macrocrystal] Other (See Comments)    "I had chills & fever"    ROS As per HPI  PE: Vitals with BMI 04/28/2020 04/19/2020 04/09/2020  Height 5\' 4"  - 5\' 4"   Weight 126 lbs - 126 lbs 6 oz  BMI 79.39 - 03.00  Systolic 923 300 762  Diastolic 71 82 77  Pulse 50 - 50     Gen: Alert, well appearing.  Patient is oriented to person, place, time, and situation. AFFECT: pleasant, lucid thought and speech. CV: Regular, rate 50, rare ectopic beat, no r/g.   LUNGS: CTA bilat, nonlabored resps, good aeration in all lung fields. EXT: no clubbing or cyanosis.  no edema.    LABS:  Lab Results  Component Value Date    TSH 1.55 11/29/2015   Lab Results  Component Value Date   WBC 6.9 04/19/2020   HGB 12.2 04/19/2020   HCT 37.0 04/19/2020   MCV 85.4 04/19/2020   PLT 201.0 04/19/2020   Lab Results  Component Value Date   IRON 64 12/15/2019   TIBC 266 12/15/2019   FERRITIN 33 12/15/2019    Lab Results  Component Value Date   CREATININE 0.60 04/09/2020   BUN 16 04/09/2020   NA 135 04/09/2020   K 4.2 04/09/2020   CL 99 04/09/2020   CO2 29 04/09/2020   Lab Results  Component Value Date   ALT 13 03/23/2020   AST 21 03/23/2020   ALKPHOS 47 03/23/2020   BILITOT 0.9 03/23/2020   Lab Results  Component Value Date   CHOL 152 03/24/2020   Lab Results  Component Value Date   HDL 33 (L) 03/24/2020   Lab Results  Component Value Date   LDLCALC 111 (H) 03/24/2020   Lab Results  Component Value Date   TRIG 40 03/24/2020   Lab Results  Component Value Date   CHOLHDL 4.6 03/24/2020   Lab Results  Component Value Date   HGBA1C 5.6 03/24/2020   IMPRESSION AND PLAN:  1) HTN: uncontrolled since CVA, expect to gradually improve over the next couple months but in the meantime will cautiously inc med treatment to get bp more reasonable. She is on max dose losartan. Her low HR precludes any inc in metoprolol dose. Hx of hyperkalemia so I'm unable to add aldactone. I'm afraid thiazide will make her urinate more and she already is miserable due to her urge incontinence. Hydralazine is an option but needs to be tid and I'm not confident she'll be able to be compliant with this. Avoiding alpha blocker due to high likelihood of orthostatic dizziness from this med---she already has a hx of this prob and is high fall risk. I decided to start clonidine patch at 0.1mg /24h  dose.   BMET was done today.  2) CVA, w/out residual deficit.  No falls since CVA.  HH PT finishes this week. Cardioembolic source, pt on eliquis low dose, no sign of overt bleeding.  3) A-fib/flutter: regular rhythm  today. On amio per EP MD but sounds like ablation of a-flutter focus is the plan that will be pursued, with the ultimate goal of being able to get off eliquis, toprol, and amio. She will be following up with Dr. Elonda Husky soon--looks like ablation tentatively scheduled for 06/03/20.  An After Visit Summary was printed and given to the patient.  FOLLOW UP: Return in about 2 weeks (around 05/12/2020) for f/u HTN.  Signed:  Crissie Sickles, MD           04/28/2020

## 2020-04-28 NOTE — Telephone Encounter (Signed)
FYI. Pt has scheduled appt at 4pm in office today.

## 2020-04-29 LAB — BASIC METABOLIC PANEL
BUN/Creatinine Ratio: 20 (calc) (ref 6–22)
BUN: 19 mg/dL (ref 7–25)
CO2: 27 mmol/L (ref 20–32)
Calcium: 8.8 mg/dL (ref 8.6–10.4)
Chloride: 101 mmol/L (ref 98–110)
Creat: 0.97 mg/dL — ABNORMAL HIGH (ref 0.60–0.88)
Glucose, Bld: 105 mg/dL — ABNORMAL HIGH (ref 65–99)
Potassium: 3.7 mmol/L (ref 3.5–5.3)
Sodium: 136 mmol/L (ref 135–146)

## 2020-05-04 DIAGNOSIS — J449 Chronic obstructive pulmonary disease, unspecified: Secondary | ICD-10-CM | POA: Diagnosis not present

## 2020-05-04 DIAGNOSIS — I69354 Hemiplegia and hemiparesis following cerebral infarction affecting left non-dominant side: Secondary | ICD-10-CM | POA: Diagnosis not present

## 2020-05-04 DIAGNOSIS — H4900 Third [oculomotor] nerve palsy, unspecified eye: Secondary | ICD-10-CM | POA: Diagnosis not present

## 2020-05-04 DIAGNOSIS — I2510 Atherosclerotic heart disease of native coronary artery without angina pectoris: Secondary | ICD-10-CM | POA: Diagnosis not present

## 2020-05-04 DIAGNOSIS — D509 Iron deficiency anemia, unspecified: Secondary | ICD-10-CM | POA: Diagnosis not present

## 2020-05-04 DIAGNOSIS — I480 Paroxysmal atrial fibrillation: Secondary | ICD-10-CM | POA: Diagnosis not present

## 2020-05-04 DIAGNOSIS — E785 Hyperlipidemia, unspecified: Secondary | ICD-10-CM | POA: Diagnosis not present

## 2020-05-04 DIAGNOSIS — I10 Essential (primary) hypertension: Secondary | ICD-10-CM | POA: Diagnosis not present

## 2020-05-04 DIAGNOSIS — I452 Bifascicular block: Secondary | ICD-10-CM | POA: Diagnosis not present

## 2020-05-04 DIAGNOSIS — I080 Rheumatic disorders of both mitral and aortic valves: Secondary | ICD-10-CM | POA: Diagnosis not present

## 2020-05-05 ENCOUNTER — Inpatient Hospital Stay: Payer: Medicare HMO | Admitting: Adult Health

## 2020-05-05 NOTE — Progress Notes (Deleted)
[facility] [address] [place] [address] [phone]       [place]. Sabrina Alvarez Date of Birth:  [birth year] Medical Record Number:  [id number]   Reason for Referral:  hospital stroke follow up    SUBJECTIVE:   CHIEF COMPLAINT:  No chief complaint on file.   HPI:   Ms. Sabrina Alvarez is a 86 y.o. female with history of hypertension, hyperlipidemia, COPD, thrombocytopenia, CAD, left 3rd nerve palsy in 2014, recent fall leading to diagnosis of A. fib/a flutter ([facility]) presented to ED on March 23, 2020 for left facial droop and left arm weakness.   Personally reviewed hospitalization pertinent lab results, lab work and imaging with summary provided.  Evaluated by Dr. Erlinda Hong with stroke work-up revealing bilateral MCA small/punctate infarcts, embolic likely from AF not on [facility].  Initiated Eliquis 2.5 mg twice daily for secondary stroke prevention and advised follow-up with cardiology outpatient.  HTN stable.  LDL 111 and initiate atorvastatin 40 mg daily in addition to omega-3.  Other stroke risk factors include advanced age and CAD but no prior stroke history.  Stroke: Bilateral MCA small/punctate infarcts - embolic - likely from atrial fibrillation not on [facility]  Code Stroke CT Head - No acute intracranial hemorrhage. Possible small acute cortical infarct involving the right precentral gyrus. Chronic/nonemergent findings detailed above.   CTA H&N - No large or medium vessel occlusion. Mild atherosclerotic disease at both carotid bifurcations but without stenosis.  MRI head - Acute 15 mm Infarct at the right motor strip, left hand representation area. One and possibly two additional punctate foci of acute ischemia in the right PCA, left MCA areas suggests embolic infarcts.   2D Echo - pending   [name] 2 - negative  LDL - 111  HgbA1c - pending  VTE prophylaxis - Lovenox  aspirin 81 mg daily prior to admission, now on  Eliquis 2.5 bid.  Continue on discharge.  Therapy recommendations:  CIR  Disposition:  Pending     ROS:   14 system review of systems performed and negative with exception of ***  PMH:  Past Medical History:  Diagnosis Date  . Arthritis    "think it's osteo; got some in my hands"  . Atrial fibrillation ([place]) 02/10/2020   Dr. Roland Rack, [employer] cards-[place],ASA and BB (stopped her ARB)-in sinus-monitor planned but pt hosp for acute R CVA, eliquis started and pt remained on rate control  . Bifascicular block   . CAD (coronary artery disease)    Cath 2009  Occluded LAD, 40-50% RCA and circ managed medically.  Repeat 06/22/15 no change.  . Chronic constipation    slow transit.  Barium enema to eval for colonic stricture 10/2014 was NORMAL  . COPD (chronic obstructive pulmonary disease) ([place])    Changes noted on CXR 2017  . CVA (cerebral vascular accident) ([place]) 03/2020   L sided weakness; a-fib-->eliquis + rate control  . Dementia ([place])   . GERD (gastroesophageal reflux disease)    (also LPR) Schatzki's ring, s/p dil '97; food impaction 08/2010, on PPI therapy since w/out further sx so no dil performed  . History of herpes zoster 06/2015   right side of chest; presented with chest pain mimicking [place]--cardiac eval showed stable CAD compared to 2009.  Marland Kitchen Hyperkalemia 06/2017   suspected due to increase of losartan from 25mg  bid to 50mg  bid.  Dose lowered back to 25 mg bid K normalized.  After increase again  to 50 qAM and 25 qPM potassium 4.8 so I did not increase dose any further (07/20/17).  . Hyperlipidemia    statin started in hosp for cva 03/2020  . Hypertension    permissive elevated bp ok  . Hyponatremia 10/03/2011   Na 131 on labs 02/2015 at [facility].  Baseline Na 132.  . Iron deficiency anemia 11/30/15; 09/2019   Occult GI bleed: 09/2019 Hb drop to 8.5, iron low, hemoccult +.  Oral iron started->Dr. Buccini eval->heme neg in his office->obs/follow Hb on oral Fe; order BE if Hb not  responding or having ongoing heme+ stool. Then hosp for a-fib + CVA, eliquis started b/c no overt bleeding noted.  . Mixed stress and urge urinary incontinence   . RBBB with left anterior fascicular block    bifascicular block  . Recurrent UTI    Bactrim prophyl, then changed to cefdinir (?med rxn?).  Cipro qd as of 08/2018.  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 Thrombocytopenia ([place]) 02/2013   Plts 114K  . Uterine cancer ([place]) 2000  . Xerostomia 2017/2018   Age related glandular atrophy + med effect (oxybutynin and gabapentin).  ANA and sjogren's panel NEG 06/2017.    PSH:  Past Surgical History:  Procedure Laterality Date  . ABDOMINAL HYSTERECTOMY  2000  . APPENDECTOMY  2000  . CARDIAC CATHETERIZATION N/A 06/22/2015   Stable single vessel CAD, EF normal.  Procedure: Left Heart Cath and Coronary Angiography;  Surgeon: Leonie Man, MD;  Location: [facility];  Service: Cardiovascular;  Laterality: N/A;  . CATARACT EXTRACTION W/ INTRAOCULAR LENS  IMPLANT, BILATERAL  1990's  . [place]; 1960  . COLONOSCOPY  04/2001   BE neg 10/2014  . DILATION AND CURETTAGE OF UTERUS    . ESOPHAGOGASTRODUODENOSCOPY  1997   for dysphagia->esoph dilatation done  . HERNIA REPAIR     abdominal "twice"  . LEFT HEART CATHETERIZATION WITH CORONARY ANGIOGRAM N/A 10/04/2011   Procedure: LEFT HEART CATHETERIZATION WITH CORONARY ANGIOGRAM;  Surgeon: Jacolyn Reedy, MD;  Location: [facility] CATH LAB;  Service: Cardiovascular;  Laterality: N/A;  . REPAIR KNEE LIGAMENT  ~ 2008   left  . TONSILLECTOMY     as a child  . TRANSTHORACIC ECHOCARDIOGRAM  05/2013; 02/2020; 03/24/20   02/2020 (new dx a-fib) EF 60-65%, valves ok, no wall motion abnorm. 03/2020, pt in a-fib/flutter EF 55-60%, valves ok, small peric effus.     Social History:  Social History   Socioeconomic History  . Marital  status: Widowed    Spouse name: Not on file  . Number of children: Not on file  . Years of education: Not on file  . Highest education level: Not on file  Occupational History  . Not on file  Tobacco Use  . Smoking status: Never Smoker  . Smokeless tobacco: Never Used  Vaping Use  . Vaping Use: Never used  Substance and Sexual Activity  . Alcohol use: No  . Drug use: No  . Sexual activity: Not on file  Other Topics Concern  . Not on file  Social History Narrative   Widow, lives alone.  Three sons, one deceased.   Orig from [facility].     Educ: HS.   Occup: retired.  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   No T/A/Ds.  Social Determinants of Health   Financial Resource Strain:   . Difficulty of Paying Living Expenses: Not on file  Food Insecurity:   . Worried About Charity fundraiser in the Last Year: Not on file  . Ran Out of Food in the Last Year: Not on file  Transportation Needs:   . Lack of Transportation (Medical): Not on file  . Lack of Transportation (Non-Medical): Not on file  Physical Activity:   . Days of Exercise per Week: Not on file  . Minutes of Exercise per Session: Not on file  Stress:   . Feeling of Stress : Not on file  Social Connections:   . Frequency of Communication with Friends and Family: Not on file  . Frequency of Social Gatherings with Friends and Family: Not on file  . Attends Religious Services: Not on file  . Active Member of Clubs or Organizations: Not on file  . Attends Archivist Meetings: Not on file  . Marital Status: Not on file  Intimate Partner Violence:   . Fear of Current or Ex-Partner: Not on file  . Emotionally Abused: Not on file  . Physically Abused: Not on file  . Sexually Abused: Not on file    Family History:  Family History  Problem Relation Age of Onset  . Arthritis Mother   . AAA (abdominal aortic aneurysm) Mother   . Diabetes Sister   . CVA Father   . Rheum  arthritis Sister   . Cancer Neg Hx   . Heart disease Neg Hx   . Thyroid disease Neg Hx     Medications:   Current Outpatient Medications on File Prior to Visit  Medication Sig Dispense Refill  . amiodarone (PACERONE) 200 MG tablet Take 200 mg by mouth daily.    Marland Kitchen apixaban (ELIQUIS) 2.5 MG TABS tablet Take 1 tablet (2.5 mg total) by mouth 2 (two) times daily. 60 tablet 0  . atorvastatin (LIPITOR) 40 MG tablet Take 1 tablet (40 mg total) by mouth daily. 30 tablet 0  . cevimeline (EVOXAC) 30 MG capsule TAKE 1 CAPSULE 3 TIMES A DAY FOR TREATMENT OF CHRONIC DRY MOUTH (Patient taking differently: Take 30 mg by mouth in the morning and at bedtime. TAKE 1 CAPSULE 3 TIMES A DAY FOR TREATMENT OF CHRONIC DRY MOUTH) 270 capsule 3  . Cholecalciferol (VITAMIN D3) 1000 UNITS CAPS Take 1,000 Units by mouth daily.     . ciprofloxacin (CIPRO) 250 MG tablet TAKE 1 TABLET BY MOUTH EVERY DAY FOR BLADDER INFECTION PROPHYLAXIS. 90 tablet 1  . cloNIDine (CATAPRES - DOSED IN MG/24 HR) 0.1 mg/24hr patch Place 1 patch (0.1 mg total) onto the skin once a week. 4 patch 1  . FERROUS SULFATE PO Take 1 tablet by mouth daily.     . fluticasone (FLONASE) 50 MCG/ACT nasal spray SPRAY 2 SPRAYS INTO EACH NOSTRIL EVERY DAY (Patient taking differently: Place 1 spray into both nostrils daily as needed for allergies. ) 48 mL 0  . losartan (COZAAR) 50 MG tablet 2 tabs po qd 90 tablet 1  . metoprolol tartrate (LOPRESSOR) 25 MG tablet Take 1 tablet (25 mg total) by mouth 2 (two) times daily. 60 tablet 0  . nitroGLYCERIN (NITROSTAT) 0.4 MG SL tablet Place 1 tablet (0.4 mg total) under the tongue every 5 (five) minutes as needed for chest pain. (Patient not taking: Reported on 04/09/2020) 25 tablet 12  . Omega-3 Fatty Acids (FISH OIL) 1000 MG CAPS Take 1 capsule by  mouth daily.    Marland Kitchen oxybutynin (DITROPAN) 5 MG tablet 1 tab po every other day (Patient taking differently: Take 5 mg by mouth every other day. 1 tab po every other day) 45 tablet  3  . pantoprazole (PROTONIX) 40 MG tablet Take 1 tablet (40 mg total) by mouth daily. 30 tablet 1  . polyethylene glycol powder (GLYCOLAX/MIRALAX) powder TAKE 17 G BY MOUTH 2 (TWO) TIMES DAILY. 527 g 5   No current facility-administered medications on file prior to visit.    Allergies:   Allergies  Allergen Reactions  . Cefdinir Hives  . Macrobid [Nitrofurantoin Macrocrystal] Other (See Comments)    "I had chills & fever"      OBJECTIVE:  Physical Exam  There were no vitals filed for this visit. There is no height or weight on file to calculate BMI. No exam data present  Depression screen [facility] 2/9 01/30/2020  Decreased Interest 0  Down, Depressed, Hopeless 0  PHQ - 2 Score 0     General: well developed, well nourished, seated, in no evident distress Head: head normocephalic and atraumatic.   Neck: supple with no carotid or supraclavicular bruits Cardiovascular: regular rate and rhythm, no murmurs Musculoskeletal: no deformity Skin:  no rash/petichiae Vascular:  Normal pulses all extremities   Neurologic Exam Mental Status: Awake and fully alert. Oriented to place and time. Recent and remote memory intact. Attention span, concentration and fund of knowledge appropriate. Mood and affect appropriate.  Cranial Nerves: Fundoscopic exam reveals sharp disc margins. Pupils equal, briskly reactive to light. Extraocular movements full without nystagmus. Visual fields full to confrontation. Hearing intact. Facial sensation intact. Face, tongue, palate moves normally and symmetrically.  Motor: Normal bulk and tone. Normal strength in all tested extremity muscles. Sensory.: intact to touch , pinprick , position and vibratory sensation.  Coordination: Rapid alternating movements normal in all extremities. Finger-to-nose and heel-to-shin performed accurately bilaterally. Gait and Station: Arises from chair without difficulty. Stance is normal. Gait demonstrates normal stride length and  balance Reflexes: 1+ and symmetric. Toes downgoing.     NIHSS  *** Modified Rankin  *** CHA2DS2-VASc *** HAS-BLED ***     ASSESSMENT: Sabrina Alvarez is a 86 y.o. year old female presented with left facial droop and left arm weakness on 03/23/2020 with stroke work up showing bilateral MCA small/punctate infarcts, embolic secondary to known AC not on AC. Vascular risk factors include HTN, HLD, CAD, A fib, and advanced age.      PLAN:  1. B/l MCA embolic stroke : Residual deficit: ***. Continue Eliquis (apixaban) daily  and atorvatatin 40mg   for secondary stroke prevention. Discussed secondary stroke prevention and close PCP follow up for aggressive stroke risk factor management  2. PAF:  3. HTN: BP goal <130/90. Stable  4. HLD: LDL goal <70. Recent LDL 111. Started atorvastatin 40mg  daily during recent stroke admission.  5.     Follow up in *** or call earlier if needed   I spent *** minutes of face-to-face and non-face-to-face time with patient.  This included previsit chart review, lab review, study review, order entry, electronic health record documentation, patient education regarding recent stroke, residual deficits, importance of managing stroke risk factors and answered all questions to patient satisfaction     Frann Rider, [facility]  [facility] [address] [place], [place] [address]  Phone [phone] Fax [phone] Note: This document was prepared with digital dictation and possible smart phrase technology. Any transcriptional errors that result  from this process are unintentional.

## 2020-05-06 ENCOUNTER — Telehealth: Payer: Self-pay | Admitting: Family Medicine

## 2020-05-06 NOTE — Telephone Encounter (Signed)
°  Attempted to schedule AWV. Unable to LVM.  Will try at later time.   Called for patient to schedule Annual Wellness Visit.  Please schedule with Nurse Health Advisor Caroleen Hamman, RN at [facility]

## 2020-05-07 ENCOUNTER — Telehealth: Payer: Self-pay

## 2020-05-07 DIAGNOSIS — H4900 Third [oculomotor] nerve palsy, unspecified eye: Secondary | ICD-10-CM | POA: Diagnosis not present

## 2020-05-07 DIAGNOSIS — I69354 Hemiplegia and hemiparesis following cerebral infarction affecting left non-dominant side: Secondary | ICD-10-CM | POA: Diagnosis not present

## 2020-05-07 DIAGNOSIS — I2510 Atherosclerotic heart disease of native coronary artery without angina pectoris: Secondary | ICD-10-CM | POA: Diagnosis not present

## 2020-05-07 DIAGNOSIS — I480 Paroxysmal atrial fibrillation: Secondary | ICD-10-CM | POA: Diagnosis not present

## 2020-05-07 DIAGNOSIS — I452 Bifascicular block: Secondary | ICD-10-CM | POA: Diagnosis not present

## 2020-05-07 DIAGNOSIS — E785 Hyperlipidemia, unspecified: Secondary | ICD-10-CM | POA: Diagnosis not present

## 2020-05-07 DIAGNOSIS — J449 Chronic obstructive pulmonary disease, unspecified: Secondary | ICD-10-CM | POA: Diagnosis not present

## 2020-05-07 DIAGNOSIS — I10 Essential (primary) hypertension: Secondary | ICD-10-CM | POA: Diagnosis not present

## 2020-05-07 DIAGNOSIS — D509 Iron deficiency anemia, unspecified: Secondary | ICD-10-CM | POA: Diagnosis not present

## 2020-05-07 DIAGNOSIS — I080 Rheumatic disorders of both mitral and aortic valves: Secondary | ICD-10-CM | POA: Diagnosis not present

## 2020-05-07 NOTE — Telephone Encounter (Signed)
Take her meds as prescribed and if having any sx from her fall or elevated BP (hdx, cp, SOB, dizziness etc) then she should be seen in the ED

## 2020-05-07 NOTE — Telephone Encounter (Signed)
Sabrina Alvarez @ [facility] [phone]  Or call   Sabrina Alvarez [phone]  (sorry no more information given)   Calling to report fall - there are no details, did not have walker with her Vitals   Blood pressure  194/82 pulse 42

## 2020-05-07 NOTE — Telephone Encounter (Signed)
Patient's son. Joey advised and voiced understanding. Denied having symptoms from fall

## 2020-05-07 NOTE — Telephone Encounter (Signed)
Please advise in PCP absence. She was last seen 11/24 and due to follow up again on 12/10.

## 2020-05-09 ENCOUNTER — Other Ambulatory Visit: Payer: Self-pay | Admitting: Family Medicine

## 2020-05-11 ENCOUNTER — Telehealth: Payer: Self-pay

## 2020-05-11 MED ORDER — AMLODIPINE BESYLATE 10 MG PO TABS: 10.0000 mg | ORAL_TABLET | Freq: Every day | ORAL | 1 refills | Status: DC

## 2020-05-11 NOTE — Telephone Encounter (Signed)
Patient advised and voiced understanding.  

## 2020-05-11 NOTE — Telephone Encounter (Addendum)
Please advise, thanks. Her next appt with you is 12/10  [place] RECORD [facility] Patient Name: Sabrina Alvarez Gender: Female DOB: 07-13-1927 Age: 86 Y 35 M 2 D Return Phone Number: [phone] (Primary), [phone] (Secondary), [phone] (Alternate) [place] [address] Client [place] [facility] - Client Client Site [place] - Day Physician Crissie Sickles - MD Contact Type Call Who Is Calling Patient / Member / Family / Caregiver Call Type Triage / Clinical Caller Name Candiace West Relationship To Patient Son Return Phone Number [phone] (Secondary) Chief Complaint Blood Pressure High Reason for Call Symptomatic / Request for Health Information Initial Comment Caller's mom had stroke 2 months ago and 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 BP Rx Losartin has been adjusted, increased, and added a patch. Has an appt on Friday but asks if she needs to be seen sooner. This am is 198/78. Cuff is brand new -- older cuff was unreliable. Has regular readings in 180s with doubled dosage of Losartin and the patch. No headache nor other sxs. Translation No No Triage Reason Other Nurse Assessment Nurse: Rock Nephew, RN, Juliann Pulse Date/Time (Eastern Time): 05/11/2020 11:17:42 AM Confirm and document reason for call. If symptomatic, describe symptoms. ---Caller stated his mother has been having high blood pressures, her medications have been adjusted but this morning her BP is 198/78. She has an appt Friday but thinks she may need to be seen before then . She did have a stroke 2 months ago. Does the patient have any new or worsening symptoms? ---Yes Will a triage be completed? ---No Select reason for no triage. ---Other Please document clinical information provided and list any resource used. ---Caller is not with patient , he is at work. I explained that I cannot  triage symptoms if he is not with patient . I offered to do a three way call with patient but caller declined and wanted to speak with someone in the office so I warm transferred him to [place] . Disp. Time Eilene Ghazi Time) Disposition Final User 05/11/2020 11:24:02 AM Clinical Call Yes Rock Nephew, RN, Juliann Pulse

## 2020-05-11 NOTE — Telephone Encounter (Signed)
Also spoke with son to notify of new Rx

## 2020-05-11 NOTE — Telephone Encounter (Signed)
OK. I'll eRx an additional bp PILL right now->amlodipine 10mg  and it will be a once a day medication. She should STAY on all other current meds, including the bp patch that I started her on last week.-thx

## 2020-05-11 NOTE — Telephone Encounter (Signed)
Sabrina Alvarez (son- DPR) called to report blood pressure that Home Health Nurse ( is home with patient) called him about.  Blood pressure reading is 198/78.  I transferred to nurse triage.   Sabrina Alvarez from Triage stated there was no way for her to verify son's POA on file and she was not able to get symptoms from patient.  She asked son to three way call to patient home and she could go over symptoms and he declined. Triage nurse called back so I could speak with son to make telephone encounter.  He is concerned about the reading being elevated.  Patient has an appt with Sabrina Alvarez on 05/14/20.  Please call Sabrina Alvarez at [place] nurse, Sabrina Alvarez, is home with patient at [phone].

## 2020-05-11 NOTE — Telephone Encounter (Signed)
FYI

## 2020-05-11 NOTE — Telephone Encounter (Signed)
Patient's son called to report another BP reading at 201/85, taken approx 12:00.

## 2020-05-14 ENCOUNTER — Other Ambulatory Visit: Payer: Self-pay

## 2020-05-14 ENCOUNTER — Encounter: Payer: Self-pay | Admitting: Family Medicine

## 2020-05-14 ENCOUNTER — Ambulatory Visit (INDEPENDENT_AMBULATORY_CARE_PROVIDER_SITE_OTHER): Payer: Medicare HMO | Admitting: Family Medicine

## 2020-05-14 VITALS — BP 156/58 | HR 45 | Temp 97.7°F | Resp 16 | Ht 64.0 in | Wt 129.2 lb

## 2020-05-14 DIAGNOSIS — I10 Essential (primary) hypertension: Secondary | ICD-10-CM | POA: Diagnosis not present

## 2020-05-14 NOTE — Progress Notes (Signed)
OFFICE VISIT  05/14/2020  CC:  Chief Complaint  Patient presents with  . Follow-up    hypertension    HPI:    Patient is a 86 y.o. Caucasian female who presents accompanied by her son for 2 wk f/u HTN. A/P as of last visit: "1) HTN: uncontrolled since CVA, expect to gradually improve over the next couple months but in the meantime will cautiously inc med treatment to get bp more reasonable. She is on max dose losartan. Her low HR precludes any inc in metoprolol dose. Hx of hyperkalemia so I'm unable to add aldactone. I'm afraid thiazide will make her urinate more and she already is miserable due to her urge incontinence. Hydralazine is an option but needs to be tid and I'm not confident she'll be able to be compliant with this. Avoiding alpha blocker due to high likelihood of orthostatic dizziness from this med---she already has a hx of this prob and is high fall risk. I decided to start clonidine patch at 0.1mg /24h dose.   BMET was done today.  2) CVA, w/out residual deficit.  No falls since CVA.  HH PT finishes this week. Cardioembolic source, pt on eliquis low dose, no sign of overt bleeding.  3) A-fib/flutter: regular rhythm today. On amio per EP MD but sounds like ablation of a-flutter focus is the plan that will be pursued, with the ultimate goal of being able to get off eliquis, toprol, and amio. She will be following up with Dr. Elonda Husky soon--looks like ablation tentatively scheduled for 06/03/20."  INTERIM HX: BP's were still up to 621H systolic so I recommended she start amlodipine 10mg  about 2 d/a.  Also continued her on clonidine TTS 0.1mg , lopressor 25mg  bid, and cozaar 100mg  qd.  Since taking amlodipine the last few days her bp has been better--160s, but occ 130s and occ 170s, diast 60-70s, hr 40s. She feels no excessive fatigue, no dizziness/presyncope. Fell x 1 when in yard w/out walker but no injury (witnessed).      Past Medical History:  Diagnosis Date   . Arthritis    "think it's osteo; got some in my hands"  . Atrial fibrillation ([place]) 02/10/2020   Dr. Roland Rack, [employer] cards-[place],ASA and BB (stopped her ARB)-in sinus-monitor planned but pt hosp for acute R CVA, eliquis started and pt remained on rate control  . Bifascicular block   . CAD (coronary artery disease)    Cath 2009  Occluded LAD, 40-50% RCA and circ managed medically.  Repeat 06/22/15 no change.  . Chronic constipation    slow transit.  Barium enema to eval for colonic stricture 10/2014 was NORMAL  . COPD (chronic obstructive pulmonary disease) ([place])    Changes noted on CXR 2017  . CVA (cerebral vascular accident) ([place]) 03/2020   L sided weakness; a-fib-->eliquis + rate control  . Dementia ([place])   . GERD (gastroesophageal reflux disease)    (also LPR) Schatzki's ring, s/p dil '97; food impaction 08/2010, on PPI therapy since w/out further sx so no dil performed  . History of herpes zoster 06/2015   right side of chest; presented with chest pain mimicking USA--cardiac eval showed stable CAD compared to 2009.  Marland Kitchen Hyperkalemia 06/2017   suspected due to increase of losartan from 25mg  bid to 50mg  bid.  Dose lowered back to 25 mg bid K normalized.  After increase again to 50 qAM and 25 qPM potassium 4.8 so I did not increase dose any further (07/20/17).  . Hyperlipidemia  statin started in hosp for cva 03/2020  . Hypertension    permissive elevated bp ok  . Hyponatremia 10/03/2011   Na 131 on labs 02/2015 at [facility].  Baseline Na 132.  . Iron deficiency anemia 11/30/15; 09/2019   Occult GI bleed: 09/2019 Hb drop to 8.5, iron low, hemoccult +.  Oral iron started->Dr. Buccini eval->heme neg in his office->obs/follow Hb on oral Fe; order BE if Hb not responding or having ongoing heme+ stool. Then hosp for a-fib + CVA, eliquis started b/c no overt bleeding noted.  . Mixed stress and urge urinary incontinence   . RBBB with left anterior fascicular block    bifascicular block  .  Recurrent UTI    Bactrim prophyl, then changed to cefdinir (?med rxn?).  Cipro qd as of 08/2018.  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 Thrombocytopenia ([place]) 02/2013   Plts 114K  . Uterine cancer ([place]) 2000  . Xerostomia 2017/2018   Age related glandular atrophy + med effect (oxybutynin and gabapentin).  ANA and sjogren's panel NEG 06/2017.    Past Surgical History:  Procedure Laterality Date  . ABDOMINAL HYSTERECTOMY  2000  . APPENDECTOMY  2000  . CARDIAC CATHETERIZATION N/A 06/22/2015   Stable single vessel CAD, EF normal.  Procedure: Left Heart Cath and Coronary Angiography;  Surgeon: Leonie Man, MD;  Location: [facility];  Service: Cardiovascular;  Laterality: N/A;  . CATARACT EXTRACTION W/ INTRAOCULAR LENS  IMPLANT, BILATERAL  1990's  . [place]; 1960  . COLONOSCOPY  04/2001   BE neg 10/2014  . DILATION AND CURETTAGE OF UTERUS    . ESOPHAGOGASTRODUODENOSCOPY  1997   for dysphagia->esoph dilatation done  . HERNIA REPAIR     abdominal "twice"  . LEFT HEART CATHETERIZATION WITH CORONARY ANGIOGRAM N/A 10/04/2011   Procedure: LEFT HEART CATHETERIZATION WITH CORONARY ANGIOGRAM;  Surgeon: Jacolyn Reedy, MD;  Location: [facility] CATH LAB;  Service: Cardiovascular;  Laterality: N/A;  . REPAIR KNEE LIGAMENT  ~ 2008   left  . TONSILLECTOMY     as a child  . TRANSTHORACIC ECHOCARDIOGRAM  05/2013; 02/2020; 03/24/20   02/2020 (new dx a-fib) EF 60-65%, valves ok, no wall motion abnorm. 03/2020, pt in a-fib/flutter EF 55-60%, valves ok, small peric effus.     Outpatient Medications Prior to Visit  Medication Sig Dispense Refill  . amiodarone (PACERONE) 200 MG tablet Take 200 mg by mouth daily.    Marland Kitchen amLODipine (NORVASC) 10 MG tablet Take 1 tablet (10 mg total) by mouth daily. 30 tablet 1  . apixaban (ELIQUIS) 2.5 MG TABS tablet Take 1 tablet (2.5 mg total) by  mouth 2 (two) times daily. 60 tablet 0  . atorvastatin (LIPITOR) 40 MG tablet Take 1 tablet (40 mg total) by mouth daily. 30 tablet 0  . cevimeline (EVOXAC) 30 MG capsule TAKE 1 CAPSULE 3 TIMES A DAY FOR TREATMENT OF CHRONIC DRY MOUTH (Patient taking differently: Take 30 mg by mouth in the morning and at bedtime. TAKE 1 CAPSULE 3 TIMES A DAY FOR TREATMENT OF CHRONIC DRY MOUTH) 270 capsule 3  . Cholecalciferol (VITAMIN D3) 1000 UNITS CAPS Take 1,000 Units by mouth daily.     . cloNIDine (CATAPRES - DOSED IN MG/24 HR) 0.1 mg/24hr patch Place 1 patch (0.1 mg total) onto the skin once a week. 4 patch 1  .  FERROUS SULFATE PO Take 1 tablet by mouth daily.     . fluticasone (FLONASE) 50 MCG/ACT nasal spray SPRAY 2 SPRAYS INTO EACH NOSTRIL EVERY DAY (Patient taking differently: Place 1 spray into both nostrils daily as needed for allergies.) 48 mL 0  . losartan (COZAAR) 50 MG tablet 2 tabs po qd 90 tablet 1  . metoprolol tartrate (LOPRESSOR) 25 MG tablet Take 1 tablet (25 mg total) by mouth 2 (two) times daily. 60 tablet 0  . Omega-3 Fatty Acids (FISH OIL) 1000 MG CAPS Take 1 capsule by mouth daily.    Marland Kitchen oxybutynin (DITROPAN) 5 MG tablet 1 tab po every other day (Patient taking differently: Take 5 mg by mouth every other day. 1 tab po every other day) 45 tablet 3  . pantoprazole (PROTONIX) 40 MG tablet Take 1 tablet (40 mg total) by mouth daily. 30 tablet 1  . polyethylene glycol powder (GLYCOLAX/MIRALAX) powder TAKE 17 G BY MOUTH 2 (TWO) TIMES DAILY. 527 g 5  . ciprofloxacin (CIPRO) 250 MG tablet TAKE 1 TABLET BY MOUTH EVERY DAY FOR BLADDER INFECTION PROPHYLAXIS. (Patient not taking: Reported on 05/14/2020) 90 tablet 1  . nitroGLYCERIN (NITROSTAT) 0.4 MG SL tablet Place 1 tablet (0.4 mg total) under the tongue every 5 (five) minutes as needed for chest pain. (Patient not taking: Reported on 05/14/2020) 25 tablet 12   No facility-administered medications prior to visit.    Allergies  Allergen Reactions   . Cefdinir Hives  . Macrobid [Nitrofurantoin Macrocrystal] Other (See Comments)    "I had chills & fever"    ROS As per HPI  PE: Vitals with BMI 05/14/2020 04/28/2020 04/19/2020  Height 5\' 4"  5\' 4"  -  Weight 129 lbs 3 oz 126 lbs -  BMI 96.78 93.81 -  Systolic 017 510 258  Diastolic 58 71 82  Pulse 45 50 -     Gen: Alert, well appearing.  Patient is oriented to person, place, time, and situation. AFFECT: pleasant, lucid thought and speech. Ears: clear, TMs normal. CV: Regular, rate 50, 2/6 syst murmur, ?fixed split S2. EXT: no clubbing or cyanosis.  no edema.   LABS:    Chemistry      Component Value Date/Time   NA 136 04/28/2020 1628   K 3.7 04/28/2020 1628   CL 101 04/28/2020 1628   CO2 27 04/28/2020 1628   BUN 19 04/28/2020 1628   CREATININE 0.97 (H) 04/28/2020 1628      Component Value Date/Time   CALCIUM 8.8 04/28/2020 1628   ALKPHOS 47 03/23/2020 1600   AST 21 03/23/2020 1600   ALT 13 03/23/2020 1600   BILITOT 0.9 03/23/2020 1600     Lab Results  Component Value Date   WBC 6.9 04/19/2020   HGB 12.2 04/19/2020   HCT 37.0 04/19/2020   MCV 85.4 04/19/2020   PLT 201.0 04/19/2020   IMPRESSION AND PLAN:  1) HTN: improving. Would accept 150s syst consistently.  HR too low to push BB or clonidine. NO CHANGES in regimen today. Cont home monitoring and call if consistently in 180s syst like last time. F/u 2 wks.  2) A-fib: currently sinus brady.  cont amiod, lopressor, eliquis, plan for ablation 06/03/20.  3) CVA, w/out residual deficit.  HH PT. Cardioembolic source, pt on eliquis low dose, no sign of overt bleeding.  An After Visit Summary was printed and given to the patient.  FOLLOW UP: Return in about 2 weeks (around 05/28/2020) for f/u HTN.  Signed:  Abbe Amsterdam  Arayna Illescas, MD           05/14/2020

## 2020-05-18 ENCOUNTER — Other Ambulatory Visit: Payer: Self-pay | Admitting: Family Medicine

## 2020-05-20 DIAGNOSIS — R69 Illness, unspecified: Secondary | ICD-10-CM | POA: Diagnosis not present

## 2020-05-25 DIAGNOSIS — R69 Illness, unspecified: Secondary | ICD-10-CM | POA: Diagnosis not present

## 2020-05-27 ENCOUNTER — Other Ambulatory Visit: Payer: Self-pay

## 2020-05-27 ENCOUNTER — Telehealth: Payer: Self-pay | Admitting: Family Medicine

## 2020-05-27 MED ORDER — METOPROLOL TARTRATE 25 MG PO TABS
25.0000 mg | ORAL_TABLET | Freq: Two times a day (BID) | ORAL | 0 refills | Status: DC
Start: 2020-05-27 — End: 2020-06-21

## 2020-05-27 NOTE — Telephone Encounter (Signed)
Patient requesting refill of metoprolol. [facility].

## 2020-05-27 NOTE — Telephone Encounter (Signed)
Rx sent in

## 2020-06-02 ENCOUNTER — Other Ambulatory Visit: Payer: Self-pay | Admitting: Family Medicine

## 2020-06-02 ENCOUNTER — Ambulatory Visit: Payer: Medicare HMO | Admitting: Family Medicine

## 2020-06-02 DIAGNOSIS — Z01812 Encounter for preprocedural laboratory examination: Secondary | ICD-10-CM | POA: Diagnosis not present

## 2020-06-02 DIAGNOSIS — I517 Cardiomegaly: Secondary | ICD-10-CM | POA: Diagnosis not present

## 2020-06-02 DIAGNOSIS — Z7901 Long term (current) use of anticoagulants: Secondary | ICD-10-CM | POA: Diagnosis not present

## 2020-06-02 DIAGNOSIS — I4892 Unspecified atrial flutter: Secondary | ICD-10-CM | POA: Diagnosis not present

## 2020-06-02 DIAGNOSIS — I2510 Atherosclerotic heart disease of native coronary artery without angina pectoris: Secondary | ICD-10-CM | POA: Diagnosis not present

## 2020-06-02 DIAGNOSIS — I10 Essential (primary) hypertension: Secondary | ICD-10-CM | POA: Diagnosis not present

## 2020-06-03 DIAGNOSIS — Z8673 Personal history of transient ischemic attack (TIA), and cerebral infarction without residual deficits: Secondary | ICD-10-CM | POA: Diagnosis not present

## 2020-06-03 DIAGNOSIS — I480 Paroxysmal atrial fibrillation: Secondary | ICD-10-CM | POA: Diagnosis not present

## 2020-06-03 DIAGNOSIS — I4892 Unspecified atrial flutter: Secondary | ICD-10-CM | POA: Diagnosis not present

## 2020-06-03 DIAGNOSIS — Z8550 Personal history of malignant neoplasm of unspecified urinary tract organ: Secondary | ICD-10-CM | POA: Diagnosis not present

## 2020-06-03 DIAGNOSIS — Z8679 Personal history of other diseases of the circulatory system: Secondary | ICD-10-CM | POA: Insufficient documentation

## 2020-06-03 DIAGNOSIS — R32 Unspecified urinary incontinence: Secondary | ICD-10-CM | POA: Diagnosis not present

## 2020-06-03 DIAGNOSIS — K219 Gastro-esophageal reflux disease without esophagitis: Secondary | ICD-10-CM | POA: Diagnosis not present

## 2020-06-03 DIAGNOSIS — J438 Other emphysema: Secondary | ICD-10-CM | POA: Diagnosis not present

## 2020-06-03 DIAGNOSIS — Z8551 Personal history of malignant neoplasm of bladder: Secondary | ICD-10-CM | POA: Diagnosis not present

## 2020-06-03 DIAGNOSIS — Z9889 Other specified postprocedural states: Secondary | ICD-10-CM | POA: Insufficient documentation

## 2020-06-03 DIAGNOSIS — I2510 Atherosclerotic heart disease of native coronary artery without angina pectoris: Secondary | ICD-10-CM | POA: Diagnosis not present

## 2020-06-03 DIAGNOSIS — I10 Essential (primary) hypertension: Secondary | ICD-10-CM | POA: Diagnosis not present

## 2020-06-03 DIAGNOSIS — J439 Emphysema, unspecified: Secondary | ICD-10-CM | POA: Diagnosis not present

## 2020-06-03 HISTORY — PX: ATRIAL FLUTTER ABLATION: SHX5733

## 2020-06-07 ENCOUNTER — Other Ambulatory Visit: Payer: Self-pay

## 2020-06-08 ENCOUNTER — Encounter: Payer: Self-pay | Admitting: Family Medicine

## 2020-06-08 ENCOUNTER — Ambulatory Visit (INDEPENDENT_AMBULATORY_CARE_PROVIDER_SITE_OTHER): Payer: Medicare HMO | Admitting: Family Medicine

## 2020-06-08 VITALS — BP 130/72 | HR 66 | Temp 97.9°F | Resp 16 | Ht 64.0 in | Wt 129.8 lb

## 2020-06-08 DIAGNOSIS — I10 Essential (primary) hypertension: Secondary | ICD-10-CM

## 2020-06-08 DIAGNOSIS — I480 Paroxysmal atrial fibrillation: Secondary | ICD-10-CM

## 2020-06-08 NOTE — Patient Instructions (Signed)
Continue all current medications at current doses.

## 2020-06-08 NOTE — Progress Notes (Signed)
OFFICE VISIT  06/08/2020  CC:  Chief Complaint  Patient presents with  . Follow-up    Hypertension, pt is not fasting    HPI:    Patient is a 87 y.o. Caucasian female who presents accompanied by her home health aid Britta Mccreedy) for 3 week f/u HTN in the setting of a-fib. A/P as of last visit: "1) HTN: improving. Would accept 150s syst consistently.  HR too low to push BB or clonidine. NO CHANGES in regimen today. Cont home monitoring and call if consistently in 180s syst like last time. F/u 2 wks.  2) A-fib: currently sinus brady.  cont amiod, lopressor, eliquis, plan for ablation 06/03/20.  3) CVA, w/out residual deficit.  HH PT. Cardioembolic source, pt on eliquis low dose, no sign of overt bleeding."  INTERIM HX: Feeling fine today. 3 d ago had episode of syncope in shower, bp was a little low upon check right after (105/68) she was brought around.  No injury.  Has been acting normal since then. Review of bp's avg syst 140 avg diast 70, HR avg 60.  Pt underwent ablation procedure by Dr. Dyane Dustman on 06/03/20. She felt no palpitations/heart racing prior to procedure and feels none since.   Past Medical History:  Diagnosis Date  . Arthritis    "think it's osteo; got some in my hands"  . Atrial fibrillation ([facility]) 02/10/2020   Dr. Baltazar Apo, [employer] cards-[place],ASA and BB (stopped her ARB)-in sinus-monitor planned but pt hosp for acute R CVA, eliquis started and pt remained on rate control  . Bifascicular block   . CAD (coronary artery disease)    Cath 2009  Occluded LAD, 40-50% RCA and circ managed medically.  Repeat 06/22/15 no change.  . Chronic constipation    slow transit.  Barium enema to eval for colonic stricture 10/2014 was NORMAL  . COPD (chronic obstructive pulmonary disease) ([facility])    Changes noted on CXR 2017  . CVA (cerebral vascular accident) ([facility]) 03/2020   L sided weakness; a-fib-->eliquis + rate control  . Dementia ([facility])   . GERD (gastroesophageal  reflux disease)    (also LPR) Schatzki's ring, s/p dil '97; food impaction 08/2010, on PPI therapy since w/out further sx so no dil performed  . History of herpes zoster 06/2015   right side of chest; presented with chest pain mimicking [place]--cardiac eval showed stable CAD compared to 2009.  Marland Kitchen Hyperkalemia 06/2017   suspected due to increase of losartan from 25mg  bid to 50mg  bid.  Dose lowered back to 25 mg bid K normalized.  After increase again to 50 qAM and 25 qPM potassium 4.8 so I did not increase dose any further (07/20/17).  . Hyperlipidemia    statin started in hosp for cva 03/2020  . Hypertension    permissive elevated bp ok  . Hyponatremia 10/03/2011   Na 131 on labs 02/2015 at [facility].  Baseline Na 132.  . Iron deficiency anemia 11/30/15; 09/2019   Occult GI bleed: 09/2019 Hb drop to 8.5, iron low, hemoccult +.  Oral iron started->Dr. Buccini eval->heme neg in his office->obs/follow Hb on oral Fe; order BE if Hb not responding or having ongoing heme+ stool. Then hosp for a-fib + CVA, eliquis started b/c no overt bleeding noted.  . Mixed stress and urge urinary incontinence   . RBBB with left anterior fascicular block    bifascicular block  . Recurrent UTI    Bactrim prophyl, then changed to cefdinir (?med rxn?).  Cipro qd  as of 08/2018.  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 Thrombocytopenia ([place]) 02/2013   Plts 114K  . Uterine cancer ([place]) 2000  . Xerostomia 2017/2018   Age related glandular atrophy + med effect (oxybutynin and gabapentin).  ANA and sjogren's panel NEG 06/2017.    Past Surgical History:  Procedure Laterality Date  . ABDOMINAL HYSTERECTOMY  2000  . APPENDECTOMY  2000  . CARDIAC CATHETERIZATION N/A 06/22/2015   Stable single vessel CAD, EF normal.  Procedure: Left Heart Cath and Coronary Angiography;  Surgeon: Leonie Man, MD;  Location: [place] CV  LAB;  Service: Cardiovascular;  Laterality: N/A;  . CATARACT EXTRACTION W/ INTRAOCULAR LENS  IMPLANT, BILATERAL  1990's  . [place]; 1960  . COLONOSCOPY  04/2001   BE neg 10/2014  . DILATION AND CURETTAGE OF UTERUS    . ESOPHAGOGASTRODUODENOSCOPY  1997   for dysphagia->esoph dilatation done  . HERNIA REPAIR     abdominal "twice"  . LEFT HEART CATHETERIZATION WITH CORONARY ANGIOGRAM N/A 10/04/2011   Procedure: LEFT HEART CATHETERIZATION WITH CORONARY ANGIOGRAM;  Surgeon: Jacolyn Reedy, MD;  Location: [facility] CATH LAB;  Service: Cardiovascular;  Laterality: N/A;  . REPAIR KNEE LIGAMENT  ~ 2008   left  . TONSILLECTOMY     as a child  . TRANSTHORACIC ECHOCARDIOGRAM  05/2013; 02/2020; 03/24/20   02/2020 (new dx a-fib) EF 60-65%, valves ok, no wall motion abnorm. 03/2020, pt in a-fib/flutter EF 55-60%, valves ok, small peric effus.     Outpatient Medications Prior to Visit  Medication Sig Dispense Refill  . amiodarone (PACERONE) 200 MG tablet Take 200 mg by mouth daily.    Marland Kitchen amLODipine (NORVASC) 10 MG tablet TAKE 1 TABLET BY MOUTH EVERY DAY 30 tablet 5  . apixaban (ELIQUIS) 2.5 MG TABS tablet Take 1 tablet (2.5 mg total) by mouth 2 (two) times daily. 60 tablet 0  . atorvastatin (LIPITOR) 40 MG tablet Take 1 tablet (40 mg total) by mouth daily. 30 tablet 0  . cevimeline (EVOXAC) 30 MG capsule TAKE 1 CAPSULE 3 TIMES A DAY FOR TREATMENT OF CHRONIC DRY MOUTH (Patient taking differently: Take 30 mg by mouth in the morning and at bedtime. TAKE 1 CAPSULE 3 TIMES A DAY FOR TREATMENT OF CHRONIC DRY MOUTH) 270 capsule 3  . Cholecalciferol (VITAMIN D3) 1000 UNITS CAPS Take 1,000 Units by mouth daily.     . cloNIDine (CATAPRES - DOSED IN MG/24 HR) 0.1 mg/24hr patch Place 1 patch (0.1 mg total) onto the skin once a week. 4 patch 1  . FERROUS SULFATE PO Take 1 tablet by mouth daily.     . fluticasone (FLONASE) 50 MCG/ACT nasal spray SPRAY 2 SPRAYS INTO EACH NOSTRIL EVERY DAY (Patient taking  differently: Place 1 spray into both nostrils daily as needed for allergies.) 48 mL 0  . losartan (COZAAR) 50 MG tablet 2 tabs po qd 90 tablet 1  . metoprolol tartrate (LOPRESSOR) 25 MG tablet Take 1 tablet (25 mg total) by mouth 2 (two) times daily. 60 tablet 0  . Omega-3 Fatty Acids (FISH OIL) 1000 MG CAPS Take 1 capsule by mouth daily.    Marland Kitchen oxybutynin (DITROPAN) 5 MG tablet 1 tab po every other day (Patient taking differently: Take 5 mg by mouth every other day. 1 tab po every other day) 45 tablet 3  .  pantoprazole (PROTONIX) 40 MG tablet Take 1 tablet (40 mg total) by mouth daily. 30 tablet 1  . polyethylene glycol powder (GLYCOLAX/MIRALAX) powder TAKE 17 G BY MOUTH 2 (TWO) TIMES DAILY. 527 g 5  . ciprofloxacin (CIPRO) 250 MG tablet TAKE 1 TABLET BY MOUTH EVERY DAY FOR BLADDER INFECTION PROPHYLAXIS. (Patient not taking: No sig reported) 90 tablet 1  . nitroGLYCERIN (NITROSTAT) 0.4 MG SL tablet Place 1 tablet (0.4 mg total) under the tongue every 5 (five) minutes as needed for chest pain. (Patient not taking: No sig reported) 25 tablet 12   No facility-administered medications prior to visit.    Allergies  Allergen Reactions  . Cefdinir Hives  . Macrobid [Nitrofurantoin Macrocrystal] Other (See Comments)    "I had chills & fever"    ROS As per HPI  PE: Vitals with BMI 06/08/2020 05/14/2020 04/28/2020  Height 5\' 4"  5\' 4"  5\' 4"   Weight 129 lbs 13 oz 129 lbs 3 oz 126 lbs  BMI 22.27 [id number]  Systolic [id number]  Diastolic 72 58 71  Pulse 66 45 50     Gen: Alert, well appearing.  Patient is oriented to person, place, time, and situation. AFFECT: pleasant, lucid thought and speech. CV: RRR, soft systolic murmur, no rub/gallop.  Rate about 60. LUNGS: CTA bilat, nonlabored. EXT: no clubbing or cyanosis.  no edema.    LABS:    Chemistry      Component Value Date/Time   NA 136 04/28/2020 1628   K 3.7 04/28/2020 1628   CL 101 04/28/2020 1628   CO2 27 04/28/2020 1628   BUN  19 04/28/2020 1628   CREATININE 0.97 (H) 04/28/2020 1628      Component Value Date/Time   CALCIUM 8.8 04/28/2020 1628   ALKPHOS 47 03/23/2020 1600   AST 21 03/23/2020 1600   ALT 13 03/23/2020 1600   BILITOT 0.9 03/23/2020 1600     Lab Results  Component Value Date   WBC 6.9 04/19/2020   HGB 12.2 04/19/2020   HCT 37.0 04/19/2020   MCV 85.4 04/19/2020   PLT 201.0 04/19/2020    IMPRESSION AND PLAN:  1) HTN; bp's good on current regimen-->permissive HTN is plan, goal bp <160 over <90. BP avg pretty near normal lately. One episode of vasovagal syncope 3 d/a while in shower-->encouraged pt to use shower chair the entire time in shower.  NO injury.  2) A-fib: now in sinus rhythm s/p ablation on 06/03/20 (per cards notes on day of ablation she was in sinus at that time as well PRIOR to procedure). Expect cardiologist to gradually taper off amio and see if she maintains NSR. Goal is to eventually get off of anticoag after documenting NSR OFF amiodarone. She has f/u arranged with Dr. Elonda Husky.  An After Visit Summary was printed and given to the patient.  FOLLOW UP: Return in about 4 weeks (around 07/06/2020) for f/u HTN/hx afib/anticoag. CBC and bmet monitoring at that time.  Signed:  Crissie Sickles, MD           06/08/2020

## 2020-06-13 ENCOUNTER — Other Ambulatory Visit: Payer: Self-pay | Admitting: Family Medicine

## 2020-06-14 ENCOUNTER — Telehealth: Payer: Self-pay | Admitting: Family Medicine

## 2020-06-14 NOTE — Telephone Encounter (Signed)
Please advise 

## 2020-06-14 NOTE — Telephone Encounter (Signed)
These blood pressures are great as long as she is not having any dizziness or episodes of acute fatigue.

## 2020-06-14 NOTE — Telephone Encounter (Signed)
Patient's son called to give recent BP readings. States Dr. Anitra Lauth started her on new BP meds and would like to know if these are too low: 06/12/20 119/60 06/13/20 120/64 06/14/20 111/53 + 114/54 Please call patient's son to advise.

## 2020-06-14 NOTE — Telephone Encounter (Signed)
Patient's son, Merry Proud advised of recommendations. If symptoms worsen, call back or proceed to nearest ED.

## 2020-06-15 ENCOUNTER — Other Ambulatory Visit: Payer: Self-pay | Admitting: Family Medicine

## 2020-06-21 ENCOUNTER — Other Ambulatory Visit: Payer: Self-pay | Admitting: Family Medicine

## 2020-06-22 DIAGNOSIS — I480 Paroxysmal atrial fibrillation: Secondary | ICD-10-CM | POA: Diagnosis not present

## 2020-06-22 DIAGNOSIS — Z8673 Personal history of transient ischemic attack (TIA), and cerebral infarction without residual deficits: Secondary | ICD-10-CM | POA: Diagnosis not present

## 2020-06-22 DIAGNOSIS — D649 Anemia, unspecified: Secondary | ICD-10-CM | POA: Diagnosis not present

## 2020-06-22 DIAGNOSIS — R55 Syncope and collapse: Secondary | ICD-10-CM | POA: Diagnosis not present

## 2020-06-22 DIAGNOSIS — Z9889 Other specified postprocedural states: Secondary | ICD-10-CM | POA: Diagnosis not present

## 2020-06-22 DIAGNOSIS — Z8679 Personal history of other diseases of the circulatory system: Secondary | ICD-10-CM | POA: Diagnosis not present

## 2020-07-12 ENCOUNTER — Other Ambulatory Visit: Payer: Self-pay | Admitting: Family Medicine

## 2020-07-16 ENCOUNTER — Other Ambulatory Visit: Payer: Self-pay | Admitting: Family Medicine

## 2020-08-11 ENCOUNTER — Other Ambulatory Visit: Payer: Self-pay | Admitting: Family Medicine

## 2020-08-12 NOTE — Telephone Encounter (Signed)
OK, will eRx irbesartan but I want her to come in for bp f/u sometime in the next couple weeks.-thx

## 2020-08-12 NOTE — Telephone Encounter (Signed)
Please advise while [facility] is [facility]

## 2020-08-13 NOTE — Telephone Encounter (Signed)
Spoke with pt's son, Joey(DPR) regarding med change. Advised this was due to backorder and follow up appointment recommended. Offered to schedule while on the phone, appt is 4/4

## 2020-08-18 DIAGNOSIS — H52223 Regular astigmatism, bilateral: Secondary | ICD-10-CM | POA: Diagnosis not present

## 2020-08-18 DIAGNOSIS — H16223 Keratoconjunctivitis sicca, not specified as Sjogren's, bilateral: Secondary | ICD-10-CM | POA: Diagnosis not present

## 2020-08-18 DIAGNOSIS — H524 Presbyopia: Secondary | ICD-10-CM | POA: Diagnosis not present

## 2020-08-18 DIAGNOSIS — Z961 Presence of intraocular lens: Secondary | ICD-10-CM | POA: Diagnosis not present

## 2020-08-25 ENCOUNTER — Telehealth: Payer: Self-pay | Admitting: Family Medicine

## 2020-08-25 NOTE — Telephone Encounter (Signed)
Attempted to schedule AWV. Unable to LVM.  Will try at later time.  

## 2020-09-05 ENCOUNTER — Other Ambulatory Visit: Payer: Self-pay | Admitting: Family Medicine

## 2020-09-06 ENCOUNTER — Telehealth: Payer: Self-pay

## 2020-09-06 ENCOUNTER — Other Ambulatory Visit: Payer: Self-pay

## 2020-09-06 ENCOUNTER — Encounter: Payer: Self-pay | Admitting: Family Medicine

## 2020-09-06 ENCOUNTER — Ambulatory Visit (INDEPENDENT_AMBULATORY_CARE_PROVIDER_SITE_OTHER): Payer: Medicare HMO | Admitting: Family Medicine

## 2020-09-06 VITALS — BP 132/72 | HR 76 | Temp 97.2°F | Ht 64.0 in | Wt 131.8 lb

## 2020-09-06 DIAGNOSIS — F0390 Unspecified dementia without behavioral disturbance: Secondary | ICD-10-CM

## 2020-09-06 DIAGNOSIS — I10 Essential (primary) hypertension: Secondary | ICD-10-CM

## 2020-09-06 DIAGNOSIS — Z8679 Personal history of other diseases of the circulatory system: Secondary | ICD-10-CM

## 2020-09-06 DIAGNOSIS — Z862 Personal history of diseases of the blood and blood-forming organs and certain disorders involving the immune mechanism: Secondary | ICD-10-CM

## 2020-09-06 DIAGNOSIS — R69 Illness, unspecified: Secondary | ICD-10-CM | POA: Diagnosis not present

## 2020-09-06 MED ORDER — IRBESARTAN 150 MG PO TABS: ORAL_TABLET | ORAL | 0 refills | Status: DC

## 2020-09-06 NOTE — Progress Notes (Signed)
OFFICE VISIT  09/06/2020  CC:  Chief Complaint  Patient presents with  . Follow-up    Medication f/u/ patient complains of swollen legs   HPI:    Patient is a 87 y.o. Caucasian female who presents for 3 mo f/u HTN, IDA, dementia.  Cardiology f/u note 06/22/20 reviewed: she was having bradycardia into 40s so her metoprolol was d/c'd and her amiodarone was cut in half to 100mg  qd dose.  She was continued on eliquis for stroke prophylaxis.  Doing pretty good.  Has someone staying with her 24/7 now and this is great. She is gaining strength, is using rolling walker.  No signif falls. During long showers she has vasovagal syncope---is sitting and attended when it happens, no fall/injury.   Taking iron tab daily, stools are black and formed.  No BRBPR.  Home bp monitoring is "pretty good" per son but he forgot her numbers at home---no signif elevations or low readings.  ROS as above, plus--> no fevers, no CP, no SOB, no wheezing, no cough, no dizziness, no HAs, no rashes, no melena/hematochezia.  No polyuria or polydipsia.  No myalgias or arthralgias.  No focal weakness, paresthesias, or tremors.  No acute vision or hearing abnormalities.  No dysuria or unusual/new urinary urgency or frequency.  No recent changes in lower legs. No n/v/d or abd pain.  No palpitations.     Past Medical History:  Diagnosis Date  . Arthritis    "think it's osteo; got some in my hands"  . Atrial fibrillation ([place]) 02/10/2020   Dr. Roland Rack, [employer] cards-[place],ASA and BB (stopped her ARB)-in sinus-monitor planned but pt hosp for acute R CVA, eliquis started and pt remained on rate control  . Bifascicular block   . CAD (coronary artery disease)    Cath 2009  Occluded LAD, 40-50% RCA and circ managed medically.  Repeat 06/22/15 no change.  . Chronic constipation    slow transit.  Barium enema to eval for colonic stricture 10/2014 was NORMAL  . COPD (chronic obstructive pulmonary disease) ([place])    Changes noted  on CXR 2017  . CVA (cerebral vascular accident) ([place]) 03/2020   L sided weakness; a-fib-->eliquis + rate control  . Dementia ([place])   . GERD (gastroesophageal reflux disease)    (also LPR) Schatzki's ring, s/p dil '97; food impaction 08/2010, on PPI therapy since w/out further sx so no dil performed  . History of herpes zoster 06/2015   right side of chest; presented with chest pain mimicking [place]--cardiac eval showed stable CAD compared to 2009.  Marland Kitchen Hyperkalemia 06/2017   suspected due to increase of losartan from 25mg  bid to 50mg  bid.  Dose lowered back to 25 mg bid K normalized.  After increase again to 50 qAM and 25 qPM potassium 4.8 so I did not increase dose any further (07/20/17).  . Hyperlipidemia    statin started in hosp for cva 03/2020  . Hypertension    permissive elevated bp ok  . Hyponatremia 10/03/2011   Na 131 on labs 02/2015 at [facility].  Baseline Na 132.  . Iron deficiency anemia 11/30/15; 09/2019   Occult GI bleed: 09/2019 Hb drop to 8.5, iron low, hemoccult +.  Oral iron started->Dr. Buccini eval->heme neg in his office->obs/follow Hb on oral Fe; order BE if Hb not responding or having ongoing heme+ stool. Then hosp for a-fib + CVA, eliquis started b/c no overt bleeding noted.  . Mixed stress and urge urinary incontinence   . RBBB with left  anterior fascicular block    bifascicular block  . Recurrent UTI    Bactrim prophyl, then changed to cefdinir (?med rxn?).  Cipro qd as of 08/2018.  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 Thrombocytopenia ([place]) 02/2013   Plts 114K  . Uterine cancer ([place]) 2000  . Xerostomia 2017/2018   Age related glandular atrophy + med effect (oxybutynin and gabapentin).  ANA and sjogren's panel NEG 06/2017.    Past Surgical History:  Procedure Laterality Date  . ABDOMINAL HYSTERECTOMY  2000  . APPENDECTOMY  2000  . CARDIAC CATHETERIZATION  N/A 06/22/2015   Stable single vessel CAD, EF normal.  Procedure: Left Heart Cath and Coronary Angiography;  Surgeon: Leonie Man, MD;  Location: [place] [facility];  Service: Cardiovascular;  Laterality: N/A;  . CATARACT EXTRACTION W/ INTRAOCULAR LENS  IMPLANT, BILATERAL  1990's  . [place]; 1960  . COLONOSCOPY  04/2001   BE neg 10/2014  . DILATION AND CURETTAGE OF UTERUS    . ESOPHAGOGASTRODUODENOSCOPY  1997   for dysphagia->esoph dilatation done  . HERNIA REPAIR     abdominal "twice"  . LEFT HEART CATHETERIZATION WITH CORONARY ANGIOGRAM N/A 10/04/2011   Procedure: LEFT HEART CATHETERIZATION WITH CORONARY ANGIOGRAM;  Surgeon: Jacolyn Reedy, MD;  Location: [facility] CATH LAB;  Service: Cardiovascular;  Laterality: N/A;  . REPAIR KNEE LIGAMENT  ~ 2008   left  . TONSILLECTOMY     as a child  . TRANSTHORACIC ECHOCARDIOGRAM  05/2013; 02/2020; 03/24/20   02/2020 (new dx a-fib) EF 60-65%, valves ok, no wall motion abnorm. 03/2020, pt in a-fib/flutter EF 55-60%, valves ok, small peric effus.     Outpatient Medications Prior to Visit  Medication Sig Dispense Refill  . amiodarone (PACERONE) 200 MG tablet Take 200 mg by mouth daily. Take 1/2 pill daily by cardiologist    . amLODipine (NORVASC) 10 MG tablet TAKE 1 TABLET BY MOUTH EVERY DAY 30 tablet 5  . apixaban (ELIQUIS) 2.5 MG TABS tablet Take 1 tablet (2.5 mg total) by mouth 2 (two) times daily. 60 tablet 0  . atorvastatin (LIPITOR) 40 MG tablet Take 1 tablet (40 mg total) by mouth daily. 30 tablet 0  . cevimeline (EVOXAC) 30 MG capsule TAKE 1 CAPSULE 3 TIMES A DAY FOR TREATMENT OF CHRONIC DRY MOUTH (Patient taking differently: Take 30 mg by mouth in the morning and at bedtime. TAKE 1 CAPSULE 3 TIMES A DAY FOR TREATMENT OF CHRONIC DRY MOUTH) 270 capsule 3  . ciprofloxacin (CIPRO) 250 MG tablet TAKE 1 TABLET BY MOUTH EVERY DAY FOR BLADDER INFECTION PROPHYLAXIS. 90 tablet 1  . cloNIDine (CATAPRES - DOSED IN MG/24 HR) 0.1 mg/24hr patch  PLACE 1 PATCH (0.1 MG TOTAL) ONTO THE SKIN ONCE A WEEK. 4 patch 1  . FERROUS SULFATE PO Take 1 tablet by mouth daily.     . fluticasone (FLONASE) 50 MCG/ACT nasal spray SPRAY 2 SPRAYS INTO EACH NOSTRIL EVERY DAY (Patient taking differently: Place 1 spray into both nostrils daily as needed for allergies.) 48 mL 0  . irbesartan (AVAPRO) 150 MG tablet 1 tab po qd 30 tablet 0  . nitroGLYCERIN (NITROSTAT) 0.4 MG SL tablet Place 1 tablet (0.4 mg total) under the tongue every 5 (five) minutes as needed for chest pain. 25 tablet 12  . Omega-3 Fatty Acids (FISH OIL) 1000 MG CAPS  Take 1 capsule by mouth daily.    Marland Kitchen oxybutynin (DITROPAN) 5 MG tablet TAKE 1 TABLET BY MOUTH EVERY OTHER DAY 45 tablet 0  . pantoprazole (PROTONIX) 40 MG tablet Take 1 tablet (40 mg total) by mouth daily. 30 tablet 1  . polyethylene glycol powder (GLYCOLAX/MIRALAX) powder TAKE 17 G BY MOUTH 2 (TWO) TIMES DAILY. 527 g 5  . Cholecalciferol (VITAMIN D3) 1000 UNITS CAPS Take 1,000 Units by mouth daily.  (Patient not taking: Reported on 09/06/2020)    . metoprolol tartrate (LOPRESSOR) 25 MG tablet TAKE 1 TABLET BY MOUTH TWICE A DAY (Patient not taking: Reported on 09/06/2020) 180 tablet 1   No facility-administered medications prior to visit.    Allergies  Allergen Reactions  . Cefdinir Hives  . Macrobid [Nitrofurantoin Macrocrystal] Other (See Comments)    "I had chills & fever"    ROS As per HPI  PE: Vitals with BMI 09/06/2020 06/08/2020 05/14/2020  Height 5\' 4"  5\' 4"  5\' 4"   Weight 131 lbs 13 oz 129 lbs 13 oz 129 lbs 3 oz  BMI 22.61 08.65 78.46  Systolic 962 952 841  Diastolic 72 72 58  Pulse 76 66 45     Gen: Alert, well appearing.  Patient is oriented to person, place, time, and situation. AFFECT: pleasant, lucid thought and speech. CV: RRR, no m/r/g.   LUNGS: CTA bilat, nonlabored resps, good aeration in all lung fields. EXT: no clubbing or cyanosis.  1+ bilat LL pitting edema, mild pretibial region venous stasis  hyperpigmentation changes.    LABS:  Lab Results  Component Value Date   TSH 1.55 11/29/2015   Lab Results  Component Value Date   WBC 6.9 04/19/2020   HGB 12.2 04/19/2020   HCT 37.0 04/19/2020   MCV 85.4 04/19/2020   PLT 201.0 04/19/2020   Lab Results  Component Value Date   IRON 64 12/15/2019   TIBC 266 12/15/2019   FERRITIN 33 12/15/2019    Lab Results  Component Value Date   CREATININE 0.97 (H) 04/28/2020   BUN 19 04/28/2020   NA 136 04/28/2020   K 3.7 04/28/2020   CL 101 04/28/2020   CO2 27 04/28/2020   Lab Results  Component Value Date   ALT 13 03/23/2020   AST 21 03/23/2020   ALKPHOS 47 03/23/2020   BILITOT 0.9 03/23/2020   Lab Results  Component Value Date   CHOL 152 03/24/2020   Lab Results  Component Value Date   HDL 33 (L) 03/24/2020   Lab Results  Component Value Date   LDLCALC 111 (H) 03/24/2020   Lab Results  Component Value Date   TRIG 40 03/24/2020   Lab Results  Component Value Date   CHOLHDL 4.6 03/24/2020   Lab Results  Component Value Date   HGBA1C 5.6 03/24/2020    IMPRESSION AND PLAN:  1) A flutter/fib; successful ablation procedure done 06/03/20. Sinus rhythm today, no sx's of arrhythmia.   HR in 70s on 100mg  amiodarone, no more BB b/c was making her too bradycardic. Anticipate cardiologist to do f/u soon and determine whether or not she needs to stay on [place].  2) HTN: well controlled. Cont irbesartan 150mg  qd, amlodipine 10mg  qd, and catapres TTS-1.  3) Hx of IDA from occult GI bleed:  Has seen GI, elected for no interventional/procedureal eval, monitoring sx's and blood counts/iron. CBC and iron monitoring today. Cont iron supplement daily.  4) Dementia: no behavioral disturbance. Stable, doing well with 24/7 home monitoring/assistance.  Family excellent at caregiving.  An After Visit Summary was printed and given to the patient.  FOLLOW UP: Return in about 3 months (around 12/06/2020) for routine chronic  illness f/u.  Signed:  Crissie Sickles, MD           09/06/2020

## 2020-09-06 NOTE — Telephone Encounter (Signed)
Patient was here for appt today, when they checked out, Merry Proud, had got a text message from pharmacy stating Dr. Anitra Lauth needs to authorize prescription.  He apologized for the inconvenience, if he knew there was an issue, he could have said something during [facility] appt.    irbesartan (AVAPRO) 150 MG tablet [[id number]]    [facility]

## 2020-09-07 LAB — CBC WITH DIFFERENTIAL/PLATELET
Basophils Absolute: 0 10*3/uL (ref 0.0–0.1)
Basophils Relative: 0.8 % (ref 0.0–3.0)
Eosinophils Absolute: 0.1 10*3/uL (ref 0.0–0.7)
Eosinophils Relative: 1.7 % (ref 0.0–5.0)
HCT: 35.9 % — ABNORMAL LOW (ref 36.0–46.0)
Hemoglobin: 12.2 g/dL (ref 12.0–15.0)
Lymphocytes Relative: 25.2 % (ref 12.0–46.0)
Lymphs Abs: 1.4 10*3/uL (ref 0.7–4.0)
MCHC: 33.8 g/dL (ref 30.0–36.0)
MCV: 91.7 fl (ref 78.0–100.0)
Monocytes Absolute: 0.4 10*3/uL (ref 0.1–1.0)
Monocytes Relative: 7.1 % (ref 3.0–12.0)
Neutro Abs: 3.6 10*3/uL (ref 1.4–7.7)
Neutrophils Relative %: 65.2 % (ref 43.0–77.0)
Platelets: 176 10*3/uL (ref 150.0–400.0)
RBC: 3.92 Mil/uL (ref 3.87–5.11)
RDW: 13.9 % (ref 11.5–15.5)
WBC: 5.6 10*3/uL (ref 4.0–10.5)

## 2020-09-07 LAB — BASIC METABOLIC PANEL
BUN: 28 mg/dL — ABNORMAL HIGH (ref 6–23)
CO2: 28 mEq/L (ref 19–32)
Calcium: 8.9 mg/dL (ref 8.4–10.5)
Chloride: 102 mEq/L (ref 96–112)
Creatinine, Ser: 1 mg/dL (ref 0.40–1.20)
GFR: 49.43 mL/min — ABNORMAL LOW (ref >60.00)
Glucose, Bld: 129 mg/dL — ABNORMAL HIGH (ref 70–99)
Potassium: 4.4 mEq/L (ref 3.5–5.1)
Sodium: 137 mEq/L (ref 135–145)

## 2020-09-07 LAB — IRON,TIBC AND FERRITIN PANEL
%SAT: 37 % (calc) (ref 16–45)
Ferritin: 93 ng/mL (ref 16–288)
Iron: 88 ug/dL (ref 45–160)
TIBC: 238 mcg/dL (calc) — ABNORMAL LOW (ref 250–450)

## 2020-09-07 NOTE — Telephone Encounter (Signed)
Spoke with pt's son, Merry Proud to advise new refill sent for Irbesartan 90 d supply.

## 2020-09-20 DIAGNOSIS — Z8673 Personal history of transient ischemic attack (TIA), and cerebral infarction without residual deficits: Secondary | ICD-10-CM | POA: Diagnosis not present

## 2020-09-20 DIAGNOSIS — I4892 Unspecified atrial flutter: Secondary | ICD-10-CM | POA: Diagnosis not present

## 2020-09-20 DIAGNOSIS — Z9889 Other specified postprocedural states: Secondary | ICD-10-CM | POA: Diagnosis not present

## 2020-09-20 DIAGNOSIS — R55 Syncope and collapse: Secondary | ICD-10-CM | POA: Diagnosis not present

## 2020-09-20 DIAGNOSIS — Z8679 Personal history of other diseases of the circulatory system: Secondary | ICD-10-CM | POA: Diagnosis not present

## 2020-09-20 DIAGNOSIS — I480 Paroxysmal atrial fibrillation: Secondary | ICD-10-CM | POA: Diagnosis not present

## 2020-09-20 DIAGNOSIS — D649 Anemia, unspecified: Secondary | ICD-10-CM | POA: Diagnosis not present

## 2020-09-24 DIAGNOSIS — R55 Syncope and collapse: Secondary | ICD-10-CM | POA: Diagnosis not present

## 2020-10-11 ENCOUNTER — Other Ambulatory Visit: Payer: Self-pay | Admitting: Family Medicine

## 2020-10-15 ENCOUNTER — Other Ambulatory Visit: Payer: Self-pay | Admitting: Family Medicine

## 2020-11-02 DIAGNOSIS — D649 Anemia, unspecified: Secondary | ICD-10-CM | POA: Diagnosis not present

## 2020-11-02 DIAGNOSIS — Z8673 Personal history of transient ischemic attack (TIA), and cerebral infarction without residual deficits: Secondary | ICD-10-CM | POA: Diagnosis not present

## 2020-11-02 DIAGNOSIS — R55 Syncope and collapse: Secondary | ICD-10-CM | POA: Diagnosis not present

## 2020-11-02 DIAGNOSIS — Z9889 Other specified postprocedural states: Secondary | ICD-10-CM | POA: Diagnosis not present

## 2020-11-02 DIAGNOSIS — Z8679 Personal history of other diseases of the circulatory system: Secondary | ICD-10-CM | POA: Diagnosis not present

## 2020-11-02 DIAGNOSIS — I4892 Unspecified atrial flutter: Secondary | ICD-10-CM | POA: Diagnosis not present

## 2020-11-02 DIAGNOSIS — I480 Paroxysmal atrial fibrillation: Secondary | ICD-10-CM | POA: Diagnosis not present

## 2020-11-02 HISTORY — PX: OTHER SURGICAL HISTORY: SHX169

## 2020-11-03 ENCOUNTER — Other Ambulatory Visit: Payer: Self-pay | Admitting: Family Medicine

## 2020-11-05 ENCOUNTER — Other Ambulatory Visit: Payer: Self-pay | Admitting: Family Medicine

## 2020-11-05 ENCOUNTER — Encounter: Payer: Self-pay | Admitting: Family Medicine

## 2020-11-30 DIAGNOSIS — K5900 Constipation, unspecified: Secondary | ICD-10-CM | POA: Diagnosis not present

## 2020-11-30 DIAGNOSIS — I4891 Unspecified atrial fibrillation: Secondary | ICD-10-CM | POA: Diagnosis not present

## 2020-11-30 DIAGNOSIS — Z008 Encounter for other general examination: Secondary | ICD-10-CM | POA: Diagnosis not present

## 2020-11-30 DIAGNOSIS — E785 Hyperlipidemia, unspecified: Secondary | ICD-10-CM | POA: Diagnosis not present

## 2020-11-30 DIAGNOSIS — D649 Anemia, unspecified: Secondary | ICD-10-CM | POA: Diagnosis not present

## 2020-11-30 DIAGNOSIS — G8929 Other chronic pain: Secondary | ICD-10-CM | POA: Diagnosis not present

## 2020-11-30 DIAGNOSIS — D6869 Other thrombophilia: Secondary | ICD-10-CM | POA: Diagnosis not present

## 2020-11-30 DIAGNOSIS — I739 Peripheral vascular disease, unspecified: Secondary | ICD-10-CM | POA: Diagnosis not present

## 2020-11-30 DIAGNOSIS — M1990 Unspecified osteoarthritis, unspecified site: Secondary | ICD-10-CM | POA: Diagnosis not present

## 2020-11-30 DIAGNOSIS — I10 Essential (primary) hypertension: Secondary | ICD-10-CM | POA: Diagnosis not present

## 2020-11-30 DIAGNOSIS — K219 Gastro-esophageal reflux disease without esophagitis: Secondary | ICD-10-CM | POA: Diagnosis not present

## 2020-12-01 ENCOUNTER — Other Ambulatory Visit: Payer: Self-pay | Admitting: Family Medicine

## 2020-12-02 ENCOUNTER — Other Ambulatory Visit: Payer: Self-pay | Admitting: Family Medicine

## 2020-12-09 ENCOUNTER — Other Ambulatory Visit: Payer: Self-pay | Admitting: Family Medicine

## 2020-12-21 ENCOUNTER — Other Ambulatory Visit: Payer: Self-pay | Admitting: Family Medicine

## 2020-12-24 ENCOUNTER — Other Ambulatory Visit: Payer: Self-pay | Admitting: *Deleted

## 2020-12-24 NOTE — Patient Outreach (Signed)
[facility]) Care Management  12/24/2020  Sabrina Alvarez 01-27-1928 [id number]  Initial telephone outreach for [place] referral for [place] [facility] services. Pt answered phone but then hung up. Called pt's son listed as contact and informed him of reason for the call and described our services. He believes his mother's needs are welll covered. She sees Dr. Anitra Lauth as often as necessary and has 24 hour caregiver. Advised will send information and then the two of them can discuss and call me back if they are in favor of utilizing the service.  Eulah Pont. Myrtie Neither, MSN, [facility] Gerontological Nurse Practitioner [facility] Care Management [phone]

## 2020-12-29 ENCOUNTER — Other Ambulatory Visit: Payer: Self-pay | Admitting: Family Medicine

## 2020-12-31 ENCOUNTER — Telehealth: Payer: Self-pay | Admitting: Family Medicine

## 2020-12-31 MED ORDER — AMLODIPINE BESYLATE 10 MG PO TABS: 10.0000 mg | ORAL_TABLET | Freq: Every day | ORAL | 0 refills | Status: DC

## 2020-12-31 MED ORDER — IRBESARTAN 150 MG PO TABS: ORAL_TABLET | ORAL | 0 refills | Status: DC

## 2020-12-31 NOTE — Telephone Encounter (Signed)
Tried calling patient, unable to LVM. Meds sent until scheduled appt

## 2020-12-31 NOTE — Telephone Encounter (Signed)
Patient requesting refills of amlodipine and irbesartan. Scheduled followup appt for 01/05/21. [facility] [place]

## 2021-01-04 ENCOUNTER — Other Ambulatory Visit: Payer: Self-pay | Admitting: Family Medicine

## 2021-01-05 ENCOUNTER — Encounter: Payer: Self-pay | Admitting: Family Medicine

## 2021-01-05 ENCOUNTER — Ambulatory Visit (INDEPENDENT_AMBULATORY_CARE_PROVIDER_SITE_OTHER): Payer: Medicare HMO | Admitting: Family Medicine

## 2021-01-05 ENCOUNTER — Other Ambulatory Visit: Payer: Self-pay

## 2021-01-05 VITALS — BP 120/60 | HR 56 | Temp 97.9°F | Resp 16 | Ht 64.0 in | Wt 131.2 lb

## 2021-01-05 DIAGNOSIS — I10 Essential (primary) hypertension: Secondary | ICD-10-CM | POA: Diagnosis not present

## 2021-01-05 DIAGNOSIS — N390 Urinary tract infection, site not specified: Secondary | ICD-10-CM

## 2021-01-05 DIAGNOSIS — Z862 Personal history of diseases of the blood and blood-forming organs and certain disorders involving the immune mechanism: Secondary | ICD-10-CM

## 2021-01-05 DIAGNOSIS — F0390 Unspecified dementia without behavioral disturbance: Secondary | ICD-10-CM

## 2021-01-05 DIAGNOSIS — Z8679 Personal history of other diseases of the circulatory system: Secondary | ICD-10-CM

## 2021-01-05 DIAGNOSIS — Z7901 Long term (current) use of anticoagulants: Secondary | ICD-10-CM | POA: Diagnosis not present

## 2021-01-05 DIAGNOSIS — R69 Illness, unspecified: Secondary | ICD-10-CM | POA: Diagnosis not present

## 2021-01-05 MED ORDER — CIPROFLOXACIN HCL 250 MG PO TABS: ORAL_TABLET | ORAL | 3 refills | Status: DC

## 2021-01-05 MED ORDER — CLONIDINE 0.1 MG/24HR TD PTWK: 0.1000 mg | MEDICATED_PATCH | TRANSDERMAL | 3 refills | Status: DC

## 2021-01-05 MED ORDER — OXYBUTYNIN CHLORIDE 5 MG PO TABS: 5.0000 mg | ORAL_TABLET | ORAL | 3 refills | Status: DC

## 2021-01-05 MED ORDER — AMLODIPINE BESYLATE 10 MG PO TABS: 10.0000 mg | ORAL_TABLET | Freq: Every day | ORAL | 1 refills | Status: DC

## 2021-01-05 MED ORDER — IRBESARTAN 150 MG PO TABS: ORAL_TABLET | ORAL | 3 refills | Status: DC

## 2021-01-05 MED ORDER — AMLODIPINE BESYLATE 10 MG PO TABS: 10.0000 mg | ORAL_TABLET | Freq: Every day | ORAL | 3 refills | Status: DC

## 2021-01-05 NOTE — Progress Notes (Signed)
OFFICE VISIT  01/05/2021  CC:  Chief Complaint  Patient presents with   Follow-up    RCI    HPI:    Patient is a 87 y.o. Caucasian female who presents accompanied by her son Merry Proud for 4 mo f/u HTN, hx of IDA, dementia, recurrent UTI, and hx of afib/flutter. A/P as of last visit: "1) A flutter/fib; successful ablation procedure done 06/03/20. Sinus rhythm today, no sx's of arrhythmia.   HR in 70s on '100mg'$  amiodarone, no more BB b/c was making her too bradycardic. Anticipate cardiologist to do f/u soon and determine whether or not she needs to stay on [place].   2) HTN: well controlled. Cont irbesartan '150mg'$  qd, amlodipine '10mg'$  qd, and catapres TTS-1.   3) Hx of IDA from occult GI bleed: Has seen GI, elected for no interventional/procedureal eval, monitoring sx's and blood counts/iron. CBC and iron monitoring today. Cont iron supplement daily.   4) Dementia: no behavioral disturbance. Stable, doing well with 24/7 home monitoring/assistance. Family excellent at caregiving."  INTERIM HX: Doing well, uses walker most of the time but she goes in yard sometimes w/out it, even stands or pulls weeds in yard in the heat sometimes, says it doesn't bother her but last week she felt dizzy and passed out in yard, no injury. Has not felt any palpitations or heart racing.  Home bp's great: 120s-130s over 70s, HR 60 avg. Still taking eliquis 2.5 bid and amiodarone 100 qd.  Most recent f/u with Dr. Elonda Husky in cardiology all was stable and plans on seeing her again 05/05/21 and hopefull can get her off eliquis and amiodarone at that time. No sign of bleeding. Taking iron qd.  Past Medical History:  Diagnosis Date   Arthritis    "think it's osteo; got some in my hands"   Atrial fibrillation ([place]) 02/10/2020   Dr. Roland Rack, [employer] cards-[place],ASA and BB (stopped her ARB)-in sinus-monitor planned but pt hosp for acute R CVA, eliquis started and pt remained on rate control   Bifascicular block    CAD  (coronary artery disease)    Cath 2009  Occluded LAD, 40-50% RCA and circ managed medically.  Repeat 06/22/15 no change.   Chronic constipation    slow transit.  Barium enema to eval for colonic stricture 10/2014 was NORMAL   COPD (chronic obstructive pulmonary disease) ([facility])    Changes noted on CXR 2017   CVA (cerebral vascular accident) ([place]) 03/2020   L sided weakness; a-fib-->eliquis + rate control   Dementia ([facility])    GERD (gastroesophageal reflux disease)    (also LPR) Schatzki's ring, s/p dil '97; food impaction 08/2010, on PPI therapy since w/out further sx so no dil performed   History of herpes zoster 06/2015   right side of chest; presented with chest pain mimicking [place]--cardiac eval showed stable CAD compared to 2009.   Hyperkalemia 06/2017   suspected due to increase of losartan from '25mg'$  bid to '50mg'$  bid.  Dose lowered back to 25 mg bid K normalized.  After increase again to 50 qAM and 25 qPM potassium 4.8 so I did not increase dose any further (07/20/17).   Hyperlipidemia    statin started in hosp for cva 03/2020   Hypertension    permissive elevated bp ok   Hyponatremia 10/03/2011   Na 131 on labs 02/2015 at [facility].  Baseline Na 132.   Iron deficiency anemia 11/30/15; 09/2019   Occult GI bleed: 09/2019 Hb drop to 8.5, iron low, hemoccult +.  Oral iron started->Dr. Buccini eval->heme neg in his office->obs/follow Hb on oral Fe; order BE if Hb not responding or having ongoing heme+ stool. Then hosp for a-fib + CVA, eliquis started b/c no overt bleeding noted.   Mixed stress and urge urinary incontinence    RBBB with left anterior fascicular block    bifascicular block   Recurrent UTI    Bactrim prophyl, then changed to cefdinir (?med rxn?).  Cipro qd as of 08/2018.   Third nerve palsy 05/2013   presented as diplopia; felt by neuro to be microvascular insult so no carotid dopplers needed (CT and MRI neg for CVA)   Thrombocytopenia ([place]) 02/2013   Plts 114K   Uterine cancer ([place])  2000   Xerostomia 2017/2018   Age related glandular atrophy + med effect (oxybutynin and gabapentin).  ANA and sjogren's panel NEG 06/2017.    Past Surgical History:  Procedure Laterality Date   ABDOMINAL HYSTERECTOMY  2000   APPENDECTOMY  2000   ATRIAL FLUTTER ABLATION  06/03/2020   CARDIAC CATHETERIZATION N/A 06/22/2015   Stable single vessel CAD, EF normal.  Procedure: Left Heart Cath and Coronary Angiography;  Surgeon: Leonie Man, MD;  Location: [place] [facility];  Service: Cardiovascular;  Laterality: N/A;   CATARACT EXTRACTION W/ INTRAOCULAR LENS  IMPLANT, BILATERAL  1990's   CESAREAN SECTION  1959; 1960   COLONOSCOPY  04/2001   BE neg 10/2014   DILATION AND CURETTAGE OF UTERUS     ESOPHAGOGASTRODUODENOSCOPY  1997   for dysphagia->esoph dilatation done   HERNIA REPAIR     abdominal "twice"   LEFT HEART CATHETERIZATION WITH CORONARY ANGIOGRAM N/A 10/04/2011   Procedure: LEFT HEART CATHETERIZATION WITH CORONARY ANGIOGRAM;  Surgeon: Jacolyn Reedy, MD;  Location: [facility] CATH LAB;  Service: Cardiovascular;  Laterality: N/A;   REPAIR KNEE LIGAMENT  ~ 2008   left   Rhythm monitoring  11/02/2020   48H holter->NSR w/1st deg AV block, occ PADs, rare PVCs, frequent sinus brady, lowest HR 47, no pauses.   TONSILLECTOMY     as a child   TRANSTHORACIC ECHOCARDIOGRAM  05/2013; 02/2020; 03/24/20   02/2020 (new dx a-fib) EF 60-65%, valves ok, no wall motion abnorm. 03/2020, pt in a-fib/flutter EF 55-60%, valves ok, small peric effus.     Outpatient Medications Prior to Visit  Medication Sig Dispense Refill   amiodarone (PACERONE) 200 MG tablet Take 200 mg by mouth daily. Take 1/2 pill daily by cardiologist     apixaban (ELIQUIS) 2.5 MG TABS tablet Take 1 tablet (2.5 mg total) by mouth 2 (two) times daily. 60 tablet 0   atorvastatin (LIPITOR) 40 MG tablet Take 1 tablet (40 mg total) by mouth daily. 30 tablet 0   cevimeline (EVOXAC) 30 MG capsule TAKE 1 CAPSULE 3 TIMES A DAY FOR TREATMENT OF  CHRONIC DRY MOUTH (Patient taking differently: 2 (two) times daily. TAKE 1 CAPSULE 3 TIMES A DAY FOR TREATMENT OF CHRONIC DRY MOUTH) 270 capsule 3   Cholecalciferol (VITAMIN D3) 1000 UNITS CAPS Take 1,000 Units by mouth daily.     FERROUS SULFATE PO Take 1 tablet by mouth daily.      fluticasone (FLONASE) 50 MCG/ACT nasal spray SPRAY 2 SPRAYS INTO EACH NOSTRIL EVERY DAY (Patient taking differently: Place 1 spray into both nostrils daily as needed for allergies.) 48 mL 0   Omega-3 Fatty Acids (FISH OIL) 1000 MG CAPS Take 1 capsule by mouth daily.     pantoprazole (PROTONIX) 40 MG tablet  Take 1 tablet (40 mg total) by mouth daily. 30 tablet 1   polyethylene glycol powder (GLYCOLAX/MIRALAX) powder TAKE 17 G BY MOUTH 2 (TWO) TIMES DAILY. 527 g 5   nitroGLYCERIN (NITROSTAT) 0.4 MG SL tablet Place 1 tablet (0.4 mg total) under the tongue every 5 (five) minutes as needed for chest pain. (Patient not taking: Reported on 01/05/2021) 25 tablet 12   amLODipine (NORVASC) 10 MG tablet Take 1 tablet (10 mg total) by mouth daily. 7 tablet 0   ciprofloxacin (CIPRO) 250 MG tablet TAKE 1 TABLET BY MOUTH EVERY DAY FOR BLADDER INFECTION PROPHYLAXIS. 30 tablet 0   cloNIDine (CATAPRES - DOSED IN MG/24 HR) 0.1 mg/24hr patch PLACE 1 PATCH (0.1 MG TOTAL) ONTO THE SKIN ONCE A WEEK. 4 patch 0   irbesartan (AVAPRO) 150 MG tablet TAKE 1 TABLET BY MOUTH EVERY DAY 7 tablet 0   oxybutynin (DITROPAN) 5 MG tablet TAKE 1 TABLET BY MOUTH EVERY OTHER DAY 45 tablet 0   No facility-administered medications prior to visit.    Allergies  Allergen Reactions   Cefdinir Hives   Macrobid [Nitrofurantoin Macrocrystal] Other (See Comments)    "I had chills & fever"    ROS As per HPI  PE: Vitals with BMI 01/05/2021 09/06/2020 06/08/2020  Height '5\' 4"'$  '5\' 4"'$  '5\' 4"'$   Weight 131 lbs 3 oz 131 lbs 13 oz 129 lbs 13 oz  BMI 22.51 [id number]  Systolic 123456 [id number]  Diastolic 60 72 72  Pulse 56 76 66   Gen: Alert, well appearing.  Patient is  oriented to person, place, time, and situation. AFFECT: pleasant, lucid thought and speech. CV: RRR, 2/6 syst murmur RUSB and LUSB, no r/g.   LUNGS: CTA bilat, nonlabored resps, good aeration in all lung fields. EXT: no clubbing or cyanosis.  2+ bilat LL pitting edema, R a bit worse than L.    LABS:    Chemistry      Component Value Date/Time   NA 137 09/06/2020 1423   K 4.4 09/06/2020 1423   CL 102 09/06/2020 1423   CO2 28 09/06/2020 1423   BUN 28 (H) 09/06/2020 1423   CREATININE 1.00 09/06/2020 1423   CREATININE 0.97 (H) 04/28/2020 1628      Component Value Date/Time   CALCIUM 8.9 09/06/2020 1423   ALKPHOS 47 03/23/2020 1600   AST 21 03/23/2020 1600   ALT 13 03/23/2020 1600   BILITOT 0.9 03/23/2020 1600     Lab Results  Component Value Date   WBC 5.6 09/06/2020   HGB 12.2 09/06/2020   HCT 35.9 (L) 09/06/2020   MCV 91.7 09/06/2020   PLT 176.0 09/06/2020   Lab Results  Component Value Date   IRON 88 09/06/2020   TIBC 238 (L) 09/06/2020   FERRITIN 93 09/06/2020   Lab Results  Component Value Date   TSH 1.55 11/29/2015   Lab Results  Component Value Date   HGBA1C 5.6 03/24/2020   IMPRESSION AND PLAN:  1) HTN, doing well on amlod 10 qd, clonidine 0.1 patch 1 q 7d, and irbesartan 150 qd. Lytes/cr today.  2) A flutter/fib; successful ablation procedure done 06/03/20. Sinus rhythm today, no sx's of arrhythmia.   HR in 60s on '100mg'$  amiodarone, no more BB b/c was making her too bradycardic. Anticipate cardiologist d/c of amio and eliquis if she's doing well at his f/u in Dec this year.  3) Hx of IDA from occult GI bleed: Has seen GI, elected for no interventional/procedureal  eval, monitoring sx's and blood counts/iron. CBC and iron monitoring today. Cont iron supplement daily.  4) Dementia: no behavioral disturbance. Stable, doing well with 24/7 home monitoring/assistance. Family excellent at caregiving.  An After Visit Summary was printed and given to the  patient.  FOLLOW UP: Return in about 4 months (around 05/07/2021).  Signed:  Crissie Sickles, MD           01/05/2021

## 2021-01-06 ENCOUNTER — Other Ambulatory Visit: Payer: Self-pay

## 2021-01-06 DIAGNOSIS — R7989 Other specified abnormal findings of blood chemistry: Secondary | ICD-10-CM

## 2021-01-06 LAB — BASIC METABOLIC PANEL
BUN/Creatinine Ratio: 23 (calc) — ABNORMAL HIGH (ref 6–22)
BUN: 33 mg/dL — ABNORMAL HIGH (ref 7–25)
CO2: 26 mmol/L (ref 20–32)
Calcium: 8.5 mg/dL — ABNORMAL LOW (ref 8.6–10.4)
Chloride: 102 mmol/L (ref 98–110)
Creat: 1.44 mg/dL — ABNORMAL HIGH (ref 0.60–0.95)
Glucose, Bld: 123 mg/dL — ABNORMAL HIGH (ref 65–99)
Potassium: 4.3 mmol/L (ref 3.5–5.3)
Sodium: 136 mmol/L (ref 135–146)

## 2021-01-06 LAB — CBC WITH DIFFERENTIAL/PLATELET
Absolute Monocytes: 502 cells/uL (ref 200–950)
Basophils Absolute: 59 cells/uL (ref 0–200)
Basophils Relative: 0.9 %
Eosinophils Absolute: 79 cells/uL (ref 15–500)
Eosinophils Relative: 1.2 %
HCT: 36.1 % (ref 35.0–45.0)
Hemoglobin: 11.9 g/dL (ref 11.7–15.5)
Lymphs Abs: 1247 cells/uL (ref 850–3900)
MCH: 30.7 pg (ref 27.0–33.0)
MCHC: 33 g/dL (ref 32.0–36.0)
MCV: 93.3 fL (ref 80.0–100.0)
MPV: 11.1 fL (ref 7.5–12.5)
Monocytes Relative: 7.6 %
Neutro Abs: 4712 cells/uL (ref 1500–7800)
Neutrophils Relative %: 71.4 %
Platelets: 153 10*3/uL (ref 140–400)
RBC: 3.87 10*6/uL (ref 3.80–5.10)
RDW: 12.5 % (ref 11.0–15.0)
Total Lymphocyte: 18.9 %
WBC: 6.6 10*3/uL (ref 3.8–10.8)

## 2021-01-06 LAB — IRON,TIBC AND FERRITIN PANEL
%SAT: 26 % (calc) (ref 16–45)
Ferritin: 100 ng/mL (ref 16–288)
Iron: 59 ug/dL (ref 45–160)
TIBC: 225 mcg/dL (calc) — ABNORMAL LOW (ref 250–450)

## 2021-01-10 ENCOUNTER — Other Ambulatory Visit: Payer: Self-pay | Admitting: *Deleted

## 2021-01-10 ENCOUNTER — Other Ambulatory Visit: Payer: Self-pay

## 2021-01-10 ENCOUNTER — Encounter: Payer: Self-pay | Admitting: *Deleted

## 2021-01-10 NOTE — Patient Outreach (Signed)
[facility]) Care Management  01/10/2021  BRADLIE MAGRUDER [birth year] [id number]   [facility] outreach to follow up/care coordination   Mrs Sabrina Alvarez reached Patient is able to verify HIPAA ([place] and Accountability Act) identifiers Reviewed and addressed the purpose of the follow up call with the patient  She informed RN CM "I'm not following you" Gave permission for RN CM to speak with son Dellis Filbert  Initial outreach to [place] unsuccessful and his voice mail box had not been set up He returned a call to RN CM as RN CM was texting him her office number  HIPAA identifiers verified  Reviewed with him the outreach from 12/24/20 He confirms the [facility] Letter and information was received RN CM explained to him that [facility] services are a no charge benefit for [employer]. Answered questions he had about [facility] program. He reports Mrs Lick is doing well at home and has 24 hour care at this time. He agreed to have patient being mailed information every three months to assist with disease home management for her chronic diseases.  He also discusses Mrs Browers is in the medicare gap/"donut hole" for Eliquis and cevimeline He agrees to allow RN CM to complete a [facility] pharmacy referral  Plans  Sent via mail EMMI education on atrial fibrillation, controlling your blood pressure through lifestyle, where to get help paying for your prescriptions, coronary artery disease in women Referred to [place] medication management/assistance of Eliquis and cevimeline Patient agrees to care plan and follow up within the next business 90 days  Keiji Melland L. Lavina Hamman, RN, BSN, [place] Coordinator Office number [phone] [facility] number [phone] Fax number [phone]

## 2021-01-14 ENCOUNTER — Other Ambulatory Visit: Payer: Self-pay

## 2021-01-14 ENCOUNTER — Ambulatory Visit (INDEPENDENT_AMBULATORY_CARE_PROVIDER_SITE_OTHER): Payer: Medicare HMO

## 2021-01-14 DIAGNOSIS — R7989 Other specified abnormal findings of blood chemistry: Secondary | ICD-10-CM | POA: Diagnosis not present

## 2021-01-14 LAB — BASIC METABOLIC PANEL
BUN: 34 mg/dL — ABNORMAL HIGH (ref 6–23)
CO2: 26 mEq/L (ref 19–32)
Calcium: 8.9 mg/dL (ref 8.4–10.5)
Chloride: 100 mEq/L (ref 96–112)
Creatinine, Ser: 1.23 mg/dL — ABNORMAL HIGH (ref 0.40–1.20)
GFR: 38.46 mL/min — ABNORMAL LOW (ref >60.00)
Glucose, Bld: 115 mg/dL — ABNORMAL HIGH (ref 70–99)
Potassium: 4.4 mEq/L (ref 3.5–5.1)
Sodium: 134 mEq/L — ABNORMAL LOW (ref 135–145)

## 2021-01-14 NOTE — Progress Notes (Signed)
Per orders of Dr. Anitra Lauth pt is here for repeat BMET. Pt tolerated draw well.

## 2021-01-19 ENCOUNTER — Ambulatory Visit: Payer: Medicare HMO

## 2021-01-26 ENCOUNTER — Telehealth: Payer: Self-pay | Admitting: Pharmacist

## 2021-01-26 DIAGNOSIS — Z79899 Other long term (current) drug therapy: Secondary | ICD-10-CM

## 2021-01-26 DIAGNOSIS — Z596 Low income: Secondary | ICD-10-CM

## 2021-01-26 NOTE — Patient Outreach (Signed)
[facility]) Care Management  01/26/2021  Sabrina Alvarez [birth year] [id number]   Referral for medication assistance sent to [place].  [name] Management Assistant [phone]

## 2021-01-26 NOTE — Progress Notes (Signed)
[facility])  [place] Team    01/26/2021  SHANNA BRUDER 28-Apr-1928 [id number]  Reason for referral: Medication Assistance  Referral source: [facility] RN Current insurance: [employer]   Outreach:  Successful telephone call with patient's son, Tyshai Boland.  HIPAA identifiers verified.   Objective:  Lab Results  Component Value Date   CREATININE 1.23 (H) 01/14/2021   CREATININE 1.44 (H) 01/05/2021   CREATININE 1.00 09/06/2020    BP Readings from Last 3 Encounters:  01/05/21 120/60  09/06/20 132/72  06/08/20 130/72    Allergies  Allergen Reactions   Cefdinir Hives   Macrobid [Nitrofurantoin Macrocrystal] Other (See Comments)    "I had chills & fever"    Medication Review for 10 plus meds:  No major DDI identified and no renal dose adjustments warranted at this time.  Eliquis dose is appropriate as patient is >55 y/o and body weight is < 60 kg.    Medication Assistance Findings:  No medication assistance needs identified. Per report from son, Mr. Blossie Howton, patient is above income for assistance with Eliquis. I made patient's son aware that there is not an assistance program available for the cevimeline as it is a generic medication.     Placed call to [facility] at [phone] to request samples of Eliquis 2.5 mg. Per the office, they do not provide samples.    [facility], [facility]quis: $142.93/30 day supply per pharmacist and  cevimeline: $112.15/90 day supply.   Placed call back to Mr. Vernier and made him aware. Mr. Mcatee reports that they have a follow up in December with [place] Tyson,MD cardiology to discuss stopping the Eliquis. He also reports that patient is taking 2 cevimeline capsules daily and has cut the mid day dose and is non complaining of any dry mouth with the reduced dose. Mr. Lester Kinsman thanked me for my call and updates.    Extra Help:  Not eligible for Extra Help Low  Income Subsidy based on reported income and assets   Additional medication assistance options reviewed with patient as warranted:  No other options identified  Plan: Will close [facility] pharmacy case as no further medication needs identified at this time.  Am happy to assist in the future as needed.    Loretha Brasil, PharmD [place] Clinical Pharmacist Direct Dial: [phone]

## 2021-01-29 ENCOUNTER — Telehealth: Payer: Self-pay

## 2021-01-29 NOTE — Telephone Encounter (Signed)
Attempted to call pt son, Jacqulynn Cadet. Unable to LVM.

## 2021-01-29 NOTE — Telephone Encounter (Signed)
Spoke with pt to schedule AWV. Pt stated that she couldn't hear and recommended I call her son, Jacqulynn Cadet.

## 2021-04-07 ENCOUNTER — Other Ambulatory Visit: Payer: Self-pay | Admitting: Family Medicine

## 2021-04-07 NOTE — Telephone Encounter (Signed)
RF request for amiodarone LOV: 01/05/21 Next ov: 05/09/21 Last written:11/81/21, historical provider  Please review and advise. Med pending

## 2021-04-07 NOTE — Telephone Encounter (Signed)
I have to defer RF of this med to her cardiologist. However, I don't want her to run out of it so if she cannot get RF through cardiologist before she is going to run out then I'll rx 30d supply.-thx

## 2021-04-08 NOTE — Telephone Encounter (Signed)
Rf request sent to her cardiologist, Dr.Tyson

## 2021-04-12 ENCOUNTER — Other Ambulatory Visit: Payer: Self-pay | Admitting: *Deleted

## 2021-04-12 ENCOUNTER — Other Ambulatory Visit: Payer: Self-pay

## 2021-04-12 NOTE — Patient Outreach (Addendum)
[facility]) Care Management  04/12/2021  TRENIYAH LYNN 06/05/1928 [id number]   [facility] outreach to patient with case closure  Mrs CARMELL ELGIN was referred to [facility] on 11/25/20 via [place] Hypertension (HTN) initiative program. Successful outreach with her son Dellis Filbert occurred on 01/10/21 after a few attempts to unsuccessfully engage with patient She informed RN CM "I'm not following you" Gave permission for RN CM to speak with son Dellis Filbert He had agreed to have patient mailed information every three months to assist with disease home management for her chronic diseases.  He also discussed Mrs Croak is in the medicare gap/"donut hole" for Eliquis and cevimeline He agreed to allow RN CM to complete a [facility] pharmacy referral  [place] referral was completed by Loretha Brasil, PharmD on 01/26/21   Assessment  Today RN CM completed a three month follow up with Dellis Filbert, son. He confirms that he manages Mrs Skillen's medications and is only having difficulty with synchronizing her [facility] list with [facility].  RN CM discussed the availability of upstream office pharmacy staff members at [facility] at [facility] [place].  He denies any other further assistance is needed nor any worsening medical issues other than memory concerns for Mrs Schnider.  RN CM inquired about [place] education sent previously. Dellis Filbert confirms that the [facility] information was sent to the patient's e-mail address which she has not checked in years.  When RN CM offered to have those items mailed to the home address, he denied the offer as this may further confuse the patient  RN CM offered to send him the information to assist with home management of patient's medical needs and he stated patient is followed by her primary and cardiologist. Mr Fleischhacker informed RN CM he had another task to complete  Patient Active Problem List   Diagnosis Date Noted   Status post ablation of atrial flutter 06/03/2020   Atrial  flutter ([place]) 04/23/2020   Dyslipidemia, goal LDL below 70    PAF (paroxysmal atrial fibrillation) (HCC)    Anemia of chronic disease    Chronic obstructive pulmonary disease ([place])    Coronary artery disease involving native coronary artery of native heart without angina pectoris    Recurrent UTI    Hyponatremia    Occult blood in stools    CVA (cerebral vascular accident) ([place]) 03/23/2020   Mild cognitive impairment with memory loss 02/11/2020   Syncope and collapse 02/11/2020   Osteopenia 04/22/2019   Overactive bladder 04/22/2019   Hyperkalemia 06/05/2017   Chronic constipation 02/25/2016   Iron deficiency anemia 12/03/2015   Postherpetic neuralgia 09/01/2015   Cerumen impaction 09/01/2015   Thyroid disorder 05/26/2015   Third nerve palsy 05/29/2013   Essential hypertension    Hyperlipidemia    GERD (gastroesophageal reflux disease)    History of malignant neoplasm of vagina    Coronary artery disease, occlusive;  100 % CTO of LAD  06/22/2007    Plans No further identified needs, this case will be closed  Goals Addressed               This Visit's Progress     Patient Stated     Find Help in My Community(THN) (pt-stated)   On track     Timeframe:  Short-Term Goal Priority:  High Start Date:                          01/10/21   Expected End Date:  05/04/21       Follow Up Date & goal complete 04/12/21    - follow-up on any referrals for help I am given    Notes:  04/12/21 confirmed upstream outreached son on 01/26/21 completed referral He confirms that he manages Mrs Molinaro's medications and is only having difficulty with [facility]izing her [facility].  RN CM discussed the availability of upstream office pharmacy staff members at [facility] [facility] at [facility] [place] 01/10/21 Dellis Filbert discussed Mrs Mohamed is in the medicare gap/"donut hole" for Eliquis and cevimeline He agreed to allow RN CM to complete a [facility] pharmacy referral         [place]. Lavina Hamman, RN, BSN, [place] Coordinator Office number [phone] [facility] number [phone] Fax number [phone]

## 2021-05-05 DIAGNOSIS — Z9889 Other specified postprocedural states: Secondary | ICD-10-CM | POA: Diagnosis not present

## 2021-05-05 DIAGNOSIS — Z8679 Personal history of other diseases of the circulatory system: Secondary | ICD-10-CM | POA: Diagnosis not present

## 2021-05-05 DIAGNOSIS — I480 Paroxysmal atrial fibrillation: Secondary | ICD-10-CM | POA: Diagnosis not present

## 2021-05-05 DIAGNOSIS — Z8673 Personal history of transient ischemic attack (TIA), and cerebral infarction without residual deficits: Secondary | ICD-10-CM | POA: Diagnosis not present

## 2021-05-05 DIAGNOSIS — D649 Anemia, unspecified: Secondary | ICD-10-CM | POA: Diagnosis not present

## 2021-05-05 DIAGNOSIS — R55 Syncope and collapse: Secondary | ICD-10-CM | POA: Diagnosis not present

## 2021-05-05 DIAGNOSIS — I4892 Unspecified atrial flutter: Secondary | ICD-10-CM | POA: Diagnosis not present

## 2021-05-09 ENCOUNTER — Encounter: Payer: Self-pay | Admitting: Family Medicine

## 2021-05-09 ENCOUNTER — Other Ambulatory Visit: Payer: Self-pay

## 2021-05-09 ENCOUNTER — Ambulatory Visit (INDEPENDENT_AMBULATORY_CARE_PROVIDER_SITE_OTHER): Payer: Medicare HMO | Admitting: Family Medicine

## 2021-05-09 VITALS — BP 114/56 | HR 55 | Temp 98.3°F | Ht 64.0 in | Wt 133.2 lb

## 2021-05-09 DIAGNOSIS — Z862 Personal history of diseases of the blood and blood-forming organs and certain disorders involving the immune mechanism: Secondary | ICD-10-CM

## 2021-05-09 DIAGNOSIS — N2889 Other specified disorders of kidney and ureter: Secondary | ICD-10-CM

## 2021-05-09 DIAGNOSIS — E7800 Pure hypercholesterolemia, unspecified: Secondary | ICD-10-CM | POA: Diagnosis not present

## 2021-05-09 DIAGNOSIS — I480 Paroxysmal atrial fibrillation: Secondary | ICD-10-CM | POA: Diagnosis not present

## 2021-05-09 DIAGNOSIS — N183 Chronic kidney disease, stage 3 unspecified: Secondary | ICD-10-CM | POA: Diagnosis not present

## 2021-05-09 DIAGNOSIS — I10 Essential (primary) hypertension: Secondary | ICD-10-CM

## 2021-05-09 NOTE — Progress Notes (Signed)
See student note for this encounter. I have attested it. Signed:  Crissie Sickles, MD           05/10/2021

## 2021-05-09 NOTE — Progress Notes (Addendum)
OFFICE VISIT  05/09/2021  CC:  Chief Complaint  Patient presents with   Follow-up    RCI; pt is not fasting   HPI:    Patient is a 87 y.o. female who presents for 4 mo f/u HTN, dementia, CRI III, and hx of aflutter/fib.   1) HTN: Patient denies HTN symptoms (change in visin, headaches). Did not bring home BP readings into clininc but does not recall any anomalies. Continues to tolerate amlod 10 qd, clonidine 0.1 patch 1 q 7d, and irbesartan 150 qd.   2) A flutter/fib; successful ablation procedure done 06/03/20: Patient denies sob, palpations or unusual sensation in the chest. Recently met with her cardiogilogist who took her off apixaban and placed her on daily baby aspirin.    3) Hx of IDA from occult GI bleed: Patient mentioned she deferred invasive testing for GI bleeding etiology. She reports occasional dark stools. Denies dizziness and chronic fatigue.   4) Dementia: Patient continues to do ADL's with supervision. Enjoys eating ice cream and sitting on the couch with her family. She is happy with her quality of life. ROS negative (cough, congestion).  ROS as above, plus--> no fevers, no CP, no SOB, no wheezing, no cough, no dizziness, no HAs, no rashes. She does have black stools, some formed and some unformed.  No polyuria or polydipsia.  No myalgias or arthralgias.  No focal weakness, paresthesias, or tremors.  No acute vision or hearing abnormalities.  No dysuria or unusual/new urinary urgency or frequency.  No recent changes in lower legs. No n/v/d or abd pain.  No palpitations.    Past Medical History:  Diagnosis Date   Arthritis    "think it's osteo; got some in my hands"   Atrial fibrillation ([place]) 02/10/2020   Dr. Roland Rack, [employer] cards-[place],ASA and BB (stopped her ARB)-in sinus-monitor planned but pt hosp for acute R CVA, eliquis started and pt remained on rate control   Bifascicular block    CAD (coronary artery disease)    Cath 2009  Occluded LAD, 40-50% RCA and  circ managed medically.  Repeat 06/22/15 no change.   Chronic constipation    slow transit.  Barium enema to eval for colonic stricture 10/2014 was NORMAL   COPD (chronic obstructive pulmonary disease) (HCC)    Changes noted on CXR 2017   CVA (cerebral vascular accident) ([place]) 03/2020   L sided weakness; a-fib-->eliquis + rate control   Dementia (HCC)    GERD (gastroesophageal reflux disease)    (also LPR) Schatzki's ring, s/p dil '97; food impaction 08/2010, on PPI therapy since w/out further sx so no dil performed   History of herpes zoster 06/2015   right side of chest; presented with chest pain mimicking USA--cardiac eval showed stable CAD compared to 2009.   Hyperkalemia 06/2017   suspected due to increase of losartan from 59m bid to 537mbid.  Dose lowered back to 25 mg bid K normalized.  After increase again to 50 qAM and 25 qPM potassium 4.8 so I did not increase dose any further (07/20/17).   Hyperlipidemia    statin started in hosp for cva 03/2020   Hypertension    permissive elevated bp ok   Hyponatremia 10/03/2011   Na 131 [facility] Baseline Na 132.   Iron deficiency anemia 11/30/15; 09/2019   Occult GI bleed: 09/2019 Hb drop to 8.5, iron low, hemoccult +.  Oral iron started->Dr. Buccini eval->heme neg in his office->obs/follow Hb on oral  Fe; order BE if Hb not responding or having ongoing heme+ stool. Then hosp for a-fib + CVA, eliquis started b/c no overt bleeding noted.   Mixed stress and urge urinary incontinence    RBBB with left anterior fascicular block    bifascicular block   Recurrent UTI    Bactrim prophyl, then changed to cefdinir (?med rxn?).  Cipro qd as of 08/2018.   Third nerve palsy 05/2013   presented as diplopia; felt by neuro to be microvascular insult so no carotid dopplers needed (CT and MRI neg for CVA)   Thrombocytopenia ([place]) 02/2013   Plts 114K   Uterine cancer ([place]) 2000   Xerostomia 2017/2018   Age related glandular atrophy + med  effect (oxybutynin and gabapentin).  ANA and sjogren's panel NEG 06/2017.    Past Surgical History:  Procedure Laterality Date   ABDOMINAL HYSTERECTOMY  2000   APPENDECTOMY  2000   ATRIAL FLUTTER ABLATION  06/03/2020   CARDIAC CATHETERIZATION N/A 06/22/2015   Stable single vessel CAD, EF normal.  Procedure: Left Heart Cath and Coronary Angiography;  Surgeon: Leonie Man, MD;  Location: [place] CV [facility];  Service: Cardiovascular;  Laterality: N/A;   CATARACT EXTRACTION W/ INTRAOCULAR LENS  IMPLANT, BILATERAL  1990's   CESAREAN SECTION  1959; 1960   COLONOSCOPY  04/2001   BE neg 10/2014   DILATION AND CURETTAGE OF UTERUS     ESOPHAGOGASTRODUODENOSCOPY  1997   for dysphagia->esoph dilatation done   HERNIA REPAIR     abdominal "twice"   LEFT HEART CATHETERIZATION WITH CORONARY ANGIOGRAM N/A 10/04/2011   Procedure: LEFT HEART CATHETERIZATION WITH CORONARY ANGIOGRAM;  Surgeon: Jacolyn Reedy, MD;  Location: [facility] CATH LAB;  Service: Cardiovascular;  Laterality: N/A;   REPAIR KNEE LIGAMENT  ~ 2008   left   Rhythm monitoring  11/02/2020   48H holter->NSR w/1st deg AV block, occ PADs, rare PVCs, frequent sinus brady, lowest HR 47, no pauses.   TONSILLECTOMY     as a child   TRANSTHORACIC ECHOCARDIOGRAM  05/2013; 02/2020; 03/24/20   02/2020 (new dx a-fib) EF 60-65%, valves ok, no wall motion abnorm. 03/2020, pt in a-fib/flutter EF 55-60%, valves ok, small peric effus.     Outpatient Medications Prior to Visit  Medication Sig Dispense Refill   amiodarone (PACERONE) 200 MG tablet Take 200 mg by mouth daily. Take 1/2 pill daily by cardiologist     amLODipine (NORVASC) 10 MG tablet Take 1 tablet (10 mg total) by mouth daily. 90 tablet 3   aspirin EC 81 MG tablet Take 81 mg by mouth daily. Swallow whole.     atorvastatin (LIPITOR) 40 MG tablet Take 1 tablet (40 mg total) by mouth daily. 30 tablet 0   cevimeline (EVOXAC) 30 MG capsule TAKE 1 CAPSULE 3 TIMES A DAY FOR TREATMENT OF CHRONIC DRY MOUTH  (Patient taking differently: daily. TAKE 1 CAPSULE 3 TIMES A DAY FOR TREATMENT OF CHRONIC DRY MOUTH) 270 capsule 3   Cholecalciferol (VITAMIN D3) 1000 UNITS CAPS Take 1,000 Units by mouth daily.     ciprofloxacin (CIPRO) 250 MG tablet TAKE 1 TABLET BY MOUTH EVERY DAY FOR BLADDER INFECTION PROPHYLAXIS. 90 tablet 3   cloNIDine (CATAPRES - DOSED IN MG/24 HR) 0.1 mg/24hr patch Place 1 patch (0.1 mg total) onto the skin once a week. 12 patch 3   FERROUS SULFATE PO Take 1 tablet by mouth daily.      fluticasone (FLONASE) 50 MCG/ACT nasal spray SPRAY 2 SPRAYS INTO  EACH NOSTRIL EVERY DAY (Patient taking differently: Place 1 spray into both nostrils daily as needed for allergies.) 48 mL 0   irbesartan (AVAPRO) 150 MG tablet TAKE 1 TABLET BY MOUTH EVERY DAY 90 tablet 3   Omega-3 Fatty Acids (FISH OIL) 1000 MG CAPS Take 1 capsule by mouth daily.     oxybutynin (DITROPAN) 5 MG tablet Take 1 tablet (5 mg total) by mouth every other day. 45 tablet 3   pantoprazole (PROTONIX) 40 MG tablet Take 1 tablet (40 mg total) by mouth daily. 30 tablet 1   polyethylene glycol powder (GLYCOLAX/MIRALAX) powder TAKE 17 G BY MOUTH 2 (TWO) TIMES DAILY. 527 g 5   apixaban (ELIQUIS) 2.5 MG TABS tablet Take 1 tablet (2.5 mg total) by mouth 2 (two) times daily. (Patient not taking: Reported on 05/09/2021) 60 tablet 0   nitroGLYCERIN (NITROSTAT) 0.4 MG SL tablet Place 1 tablet (0.4 mg total) under the tongue every 5 (five) minutes as needed for chest pain. (Patient not taking: Reported on 01/05/2021) 25 tablet 12   No facility-administered medications prior to visit.    Allergies  Allergen Reactions   Cefdinir Hives   Macrobid [Nitrofurantoin Macrocrystal] Other (See Comments)    "I had chills & fever"    ROS As per HPI  PE: Vitals with BMI 05/09/2021 01/05/2021 09/06/2020  Height 5' 4"  5' 4"  5' 4"   Weight 133 lbs 3 oz 131 lbs 3 oz 131 lbs 13 oz  BMI 22.85 84.69 62.95  Systolic 284 132 440  Diastolic 56 60 72  Pulse 55 56 76    Physical Exam Constitutional:      Appearance: Normal appearance.  Cardiovascular:     Rate and Rhythm: Normal rate and regular rhythm.     Pulses: Normal pulses.     Heart sounds: Normal heart sounds.  Pulmonary:     Effort: Pulmonary effort is normal.     Breath sounds: Normal breath sounds.  Neurological:     Mental Status: She is alert.  Psychiatric:        Mood and Affect: Mood normal.        Behavior: Behavior normal.        Thought Content: Thought content normal.     LABS:  Lab Results  Component Value Date   TSH 1.55 11/29/2015   Lab Results  Component Value Date   WBC 6.6 01/05/2021   HGB 11.9 01/05/2021   HCT 36.1 01/05/2021   MCV 93.3 01/05/2021   PLT 153 01/05/2021   Lab Results  Component Value Date   IRON 59 01/05/2021   TIBC 225 (L) 01/05/2021   FERRITIN 100 01/05/2021   Lab Results  Component Value Date   CREATININE 1.23 (H) 01/14/2021   BUN 34 (H) 01/14/2021   NA 134 (L) 01/14/2021   K 4.4 01/14/2021   CL 100 01/14/2021   CO2 26 01/14/2021   Lab Results  Component Value Date   ALT 13 03/23/2020   AST 21 03/23/2020   ALKPHOS 47 03/23/2020   BILITOT 0.9 03/23/2020   Lab Results  Component Value Date   CHOL 152 03/24/2020   Lab Results  Component Value Date   HDL 33 (L) 03/24/2020   Lab Results  Component Value Date   LDLCALC 111 (H) 03/24/2020   Lab Results  Component Value Date   TRIG 40 03/24/2020   Lab Results  Component Value Date   CHOLHDL 4.6 03/24/2020   Lab Results  Component Value Date  HGBA1C 5.6 03/24/2020   IMPRESSION AND PLAN: Non-toxic appearing female who presents for f/u for HTN, Aflutter/fib, IDA, CRI III dementia.  1) HTN: stable with a reading of 114/56. Continue amlod 10 qd, clonidine 0.1 patch 1 q 7d, and irbesartan 150 qd. BMP today   2) A flutter/fib; successful ablation procedure done 06/03/20. Stable with a pulse at 55 on 100 mg of amiodarone. Patient has a normal sinus rhythm today with no  mention of heart flutter or palpitations. Cardiologist is considering ween patient off amiodarone slowly. Continue amiodarone and baby aspirin 81 mg.   3) Hx of IDA from occult GI bleed: no signs of significant blood loss (dizziness, fatigue). Has seen GI, elected for no interventional/procedureal eval. Monitoring sx's and blood counts/iron. Continue oral iron supplement.   4) Dementia: Stable, doing well with 24/7 home monitoring/assistance. No behavioral disturbance.  5) CRI III:  (01/14/21- elevated 115 glucose, elevated BUN 34, Cr 1.23, GFR 38) but patient remains asymptomatic (no edema, sob). Repeat BMP today.   Lab Orders         Lipid panel         Comprehensive metabolic panel         CBC         Iron, TIBC and Ferritin Panel      An After Visit Summary was printed and given to the patient.  FOLLOW UP: 3-4 mo  Phil Dopp - MS3  Signed:  Crissie Sickles, MD           05/09/2021

## 2021-05-10 LAB — COMPREHENSIVE METABOLIC PANEL
ALT: 13 U/L (ref 0–35)
AST: 18 U/L (ref 0–37)
Albumin: 4.1 g/dL (ref 3.5–5.2)
Alkaline Phosphatase: 55 U/L (ref 39–117)
BUN: 30 mg/dL — ABNORMAL HIGH (ref 6–23)
CO2: 27 mEq/L (ref 19–32)
Calcium: 8.7 mg/dL (ref 8.4–10.5)
Chloride: 102 mEq/L (ref 96–112)
Creatinine, Ser: 1.29 mg/dL — ABNORMAL HIGH (ref 0.40–1.20)
GFR: 36.24 mL/min — ABNORMAL LOW (ref >60.00)
Glucose, Bld: 110 mg/dL — ABNORMAL HIGH (ref 70–99)
Potassium: 4.4 mEq/L (ref 3.5–5.1)
Sodium: 136 mEq/L (ref 135–145)
Total Bilirubin: 0.6 mg/dL (ref 0.2–1.2)
Total Protein: 6.4 g/dL (ref 6.0–8.3)

## 2021-05-10 LAB — LIPID PANEL
Cholesterol: 146 mg/dL (ref 0–200)
HDL: 58.8 mg/dL (ref >39.00)
LDL Cholesterol: 72 mg/dL (ref 0–99)
NonHDL: 87.68
Total CHOL/HDL Ratio: 2
Triglycerides: 76 mg/dL (ref 0.0–149.0)
VLDL: 15.2 mg/dL (ref 0.0–40.0)

## 2021-05-10 LAB — IRON,TIBC AND FERRITIN PANEL
%SAT: 28 % (calc) (ref 16–45)
Ferritin: 91 ng/mL (ref 16–288)
Iron: 66 ug/dL (ref 45–160)
TIBC: 238 mcg/dL (calc) — ABNORMAL LOW (ref 250–450)

## 2021-05-10 LAB — CBC
HCT: 34.9 % — ABNORMAL LOW (ref 36.0–46.0)
Hemoglobin: 11.6 g/dL — ABNORMAL LOW (ref 12.0–15.0)
MCHC: 33.4 g/dL (ref 30.0–36.0)
MCV: 92.2 fl (ref 78.0–100.0)
Platelets: 149 10*3/uL — ABNORMAL LOW (ref 150.0–400.0)
RBC: 3.78 Mil/uL — ABNORMAL LOW (ref 3.87–5.11)
RDW: 13.7 % (ref 11.5–15.5)
WBC: 6.6 10*3/uL (ref 4.0–10.5)

## 2021-05-16 ENCOUNTER — Telehealth: Payer: Self-pay

## 2021-05-16 NOTE — Telephone Encounter (Signed)
LVM for pt to CB and schedule Medicare Annual Wellness Visit (AWV).   ?

## 2021-05-20 ENCOUNTER — Other Ambulatory Visit: Payer: Self-pay

## 2021-05-20 ENCOUNTER — Ambulatory Visit (INDEPENDENT_AMBULATORY_CARE_PROVIDER_SITE_OTHER): Payer: Medicare HMO | Admitting: Family Medicine

## 2021-05-20 ENCOUNTER — Encounter: Payer: Self-pay | Admitting: Family Medicine

## 2021-05-20 VITALS — BP 135/70 | HR 54 | Temp 97.7°F | Ht 64.0 in | Wt 135.2 lb

## 2021-05-20 DIAGNOSIS — N3000 Acute cystitis without hematuria: Secondary | ICD-10-CM | POA: Diagnosis not present

## 2021-05-20 DIAGNOSIS — N390 Urinary tract infection, site not specified: Secondary | ICD-10-CM | POA: Diagnosis not present

## 2021-05-20 LAB — POCT URINALYSIS DIPSTICK
Bilirubin, UA: NEGATIVE
Blood, UA: NEGATIVE
Glucose, UA: NEGATIVE
Ketones, UA: NEGATIVE
Nitrite, UA: NEGATIVE
Protein, UA: NEGATIVE
Spec Grav, UA: <=1.005 — AB (ref 1.010–1.025)
Urobilinogen, UA: NEGATIVE E.U./dL — AB
pH, UA: 7 (ref 5.0–8.0)

## 2021-05-20 MED ORDER — CIPROFLOXACIN HCL 500 MG PO TABS: 500.0000 mg | ORAL_TABLET | Freq: Two times a day (BID) | ORAL | 0 refills | Status: AC

## 2021-05-20 NOTE — Progress Notes (Signed)
OFFICE VISIT  05/20/2021  CC:  Chief Complaint  Patient presents with   UTI symptoms    Frequency and burning; 2-3 days   HPI:    Patient is a 87 y.o. female with hx of recurrent UTI (currently on cipro 250 qd suppressive therapy) who presents for urinary concerns.  HPI: She is here with her caregiver Sabrina Alvarez today. She reports a 2 to 3-day history of dysuria, urinary urgency, and urinary frequency.  No abdominal pain, no flank pain, no fever. No blood in urine.  She was recently taken off of her Eliquis by her cardiologist.   Past Medical History:  Diagnosis Date   Arthritis    "think it's osteo; got some in my hands"   Atrial fibrillation ([place]) 02/10/2020   Dr. Roland Rack, [employer] cards-[place],ASA and BB (stopped her ARB)-in sinus-monitor planned but pt hosp for acute R CVA, eliquis started and pt remained on rate control   Bifascicular block    CAD (coronary artery disease)    Cath 2009  Occluded LAD, 40-50% RCA and circ managed medically.  Repeat 06/22/15 no change.   Chronic constipation    slow transit.  Barium enema to eval for colonic stricture 10/2014 was NORMAL   COPD (chronic obstructive pulmonary disease) (HCC)    Changes noted on CXR 2017   CVA (cerebral vascular accident) ([place]) 03/2020   L sided weakness; a-fib-->eliquis + rate control   Dementia (HCC)    GERD (gastroesophageal reflux disease)    (also LPR) Schatzki's ring, s/p dil '97; food impaction 08/2010, on PPI therapy since w/out further sx so no dil performed   History of herpes zoster 06/2015   right side of chest; presented with chest pain mimicking [place]--cardiac eval showed stable CAD compared to 2009.   Hyperkalemia 06/2017   suspected due to increase of losartan from 25mg  bid to 50mg  bid.  Dose lowered back to 25 mg bid K normalized.  After increase again to 50 qAM and 25 qPM potassium 4.8 so I did not increase dose any further (07/20/17).   Hyperlipidemia    statin started in hosp for cva 03/2020    Hypertension    permissive elevated bp ok   Hyponatremia 10/03/2011   Na 131 on labs 02/2015 at [facility].  Baseline Na 132.   Iron deficiency anemia 11/30/15; 09/2019   Occult GI bleed: 09/2019 Hb drop to 8.5, iron low, hemoccult +.  Oral iron started->Dr. Buccini eval->heme neg in his office->obs/follow Hb on oral Fe; order BE if Hb not responding or having ongoing heme+ stool. Then hosp for a-fib + CVA, eliquis started b/c no overt bleeding noted.   Mixed stress and urge urinary incontinence    RBBB with left anterior fascicular block    bifascicular block   Recurrent UTI    Bactrim prophyl, then changed to cefdinir (?med rxn?).  Cipro qd as of 08/2018.   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) 2000   Xerostomia 2017/2018   Age related glandular atrophy + med effect (oxybutynin and gabapentin).  ANA and sjogren's panel NEG 06/2017.    Past Surgical History:  Procedure Laterality Date   ABDOMINAL HYSTERECTOMY  2000   APPENDECTOMY  2000   ATRIAL FLUTTER ABLATION  06/03/2020   CARDIAC CATHETERIZATION N/A 06/22/2015   Stable  single vessel CAD, EF normal.  Procedure: Left Heart Cath and Coronary Angiography;  Surgeon: Leonie Man, MD;  Location: [place];  Service: Cardiovascular;  Laterality: N/A;   CATARACT EXTRACTION W/ INTRAOCULAR LENS  IMPLANT, BILATERAL  1990's   CESAREAN SECTION  1959; 1960   COLONOSCOPY  04/2001   BE neg 10/2014   DILATION AND CURETTAGE OF UTERUS     ESOPHAGOGASTRODUODENOSCOPY  1997   for dysphagia->esoph dilatation done   HERNIA REPAIR     abdominal "twice"   LEFT HEART CATHETERIZATION WITH CORONARY ANGIOGRAM N/A 10/04/2011   Procedure: LEFT HEART CATHETERIZATION WITH CORONARY ANGIOGRAM;  Surgeon: Jacolyn Reedy, MD;  Location: [facility] C[facility];  Service: Cardiovascular;  Laterality:  N/A;   REPAIR KNEE LIGAMENT  ~ 2008   left   Rhythm monitoring  11/02/2020   48H holter->NSR w/1st deg AV block, occ PADs, rare PVCs, frequent sinus brady, lowest HR 47, no pauses.   TONSILLECTOMY     as a child   TRANSTHORACIC ECHOCARDIOGRAM  05/2013; 02/2020; 03/24/20   02/2020 (new dx a-fib) EF 60-65%, valves ok, no wall motion abnorm. 03/2020, pt in a-fib/flutter EF 55-60%, valves ok, small peric effus.     Outpatient Medications Prior to Visit  Medication Sig Dispense Refill   amiodarone (PACERONE) 200 MG tablet Take 200 mg by mouth daily. Take 1/2 pill daily by cardiologist     amLODipine (NORVASC) 10 MG tablet Take 1 tablet (10 mg total) by mouth daily. 90 tablet 3   aspirin EC 81 MG tablet Take 81 mg by mouth daily. Swallow whole.     atorvastatin (LIPITOR) 40 MG tablet Take 1 tablet (40 mg total) by mouth daily. 30 tablet 0   cevimeline (EVOXAC) 30 MG capsule TAKE 1 CAPSULE 3 TIMES A DAY FOR TREATMENT OF CHRONIC DRY MOUTH (Patient taking differently: daily. TAKE 1 CAPSULE 3 TIMES A DAY FOR TREATMENT OF CHRONIC DRY MOUTH) 270 capsule 3   Cholecalciferol (VITAMIN D3) 1000 UNITS CAPS Take 1,000 Units by mouth daily.     ciprofloxacin (CIPRO) 250 MG tablet TAKE 1 TABLET BY MOUTH EVERY DAY FOR BLADDER INFECTION PROPHYLAXIS. 90 tablet 3   cloNIDine (CATAPRES - DOSED IN MG/24 HR) 0.1 mg/24hr patch Place 1 patch (0.1 mg total) onto the skin once a week. 12 patch 3   FERROUS SULFATE PO Take 1 tablet by mouth daily.      fluticasone (FLONASE) 50 MCG/ACT nasal spray SPRAY 2 SPRAYS INTO EACH NOSTRIL EVERY DAY (Patient taking differently: Place 1 spray into both nostrils daily as needed for allergies.) 48 mL 0   irbesartan (AVAPRO) 150 MG tablet TAKE 1 TABLET BY MOUTH EVERY DAY 90 tablet 3   Omega-3 Fatty Acids (FISH OIL) 1000 MG CAPS Take 1 capsule by mouth daily.     oxybutynin (DITROPAN) 5 MG tablet Take 1 tablet (5 mg total) by mouth every other day. 45 tablet 3   pantoprazole (PROTONIX) 40  MG tablet Take 1 tablet (40 mg total) by mouth daily. 30 tablet 1   polyethylene glycol powder (GLYCOLAX/MIRALAX) powder TAKE 17 G BY MOUTH 2 (TWO) TIMES DAILY. 527 g 5   nitroGLYCERIN (NITROSTAT) 0.4 MG SL tablet Place 1 tablet (0.4 mg total) under the tongue every 5 (five) minutes as needed for chest pain. (Patient not taking: Reported on 05/20/2021) 25 tablet 12   apixaban (ELIQUIS) 2.5 MG TABS tablet Take 1 tablet (2.5 mg total) by mouth 2 (two) times daily. (Patient not  taking: Reported on 05/09/2021) 60 tablet 0   No facility-administered medications prior to visit.    Allergies  Allergen Reactions   Cefdinir Hives   Macrobid [Nitrofurantoin Macrocrystal] Other (See Comments)    "I had chills & fever"    ROS As per HPI  PE: Vitals with BMI 05/20/2021 05/09/2021 01/05/2021  Height 5\' 4"  5\' 4"  5\' 4"   Weight 135 lbs 3 oz 133 lbs 3 oz 131 lbs 3 oz  BMI 23.2 40.98 11.91  Systolic 478 295 621  Diastolic 70 56 60  Pulse 54 55 56     Physical Exam  Gen: Alert, well appearing.  Patient is oriented to person, place, time, and situation. AFFECT: pleasant, lucid thought and speech. CV: RRR, no m/r/g.   LUNGS: CTA bilat, nonlabored resps, good aeration in all lung fields. EXT: no clubbing or cyanosis.  no edema.    LABS:  Last CBC Lab Results  Component Value Date   WBC 6.6 05/09/2021   HGB 11.6 (L) 05/09/2021   HCT 34.9 (L) 05/09/2021   MCV 92.2 05/09/2021   MCH 30.7 01/05/2021   RDW 13.7 05/09/2021   PLT 149.0 (L) 30/86/5784   Last metabolic panel Lab Results  Component Value Date   GLUCOSE 110 (H) 05/09/2021   NA 136 05/09/2021   K 4.4 05/09/2021   CL 102 05/09/2021   CO2 27 05/09/2021   BUN 30 (H) 05/09/2021   CREATININE 1.29 (H) 05/09/2021   GFRNONAA >60 03/26/2020   CALCIUM 8.7 05/09/2021   PROT 6.4 05/09/2021   ALBUMIN 4.1 05/09/2021   BILITOT 0.6 05/09/2021   ALKPHOS 55 05/09/2021   AST 18 05/09/2021   ALT 13 05/09/2021   ANIONGAP 12 03/26/2020   POC  CC dipstick UA today:  moderate LEU, o/w normal.  IMPRESSION AND PLAN:  Acute lower UTI, history of recurrent UTI--on daily Cipro 250 mg suppressive therapy. Will do 3-day course of cipro 500 bid.  Sent C/S.   Looking back through her urine cultures the large majority of her infections were E. coli, all of which were sensitive to Cipro. Of note, she has allergies to Macrobid and cefdinir.  An After Visit Summary was printed and given to the patient.  FOLLOW UP: Return if symptoms worsen or fail to improve.  Signed:  Crissie Sickles, MD           05/20/2021

## 2021-05-22 LAB — URINE CULTURE
MICRO NUMBER:: 12769339
SPECIMEN QUALITY:: ADEQUATE

## 2021-05-24 ENCOUNTER — Telehealth: Payer: Self-pay

## 2021-05-24 MED ORDER — SULFAMETHOXAZOLE-TRIMETHOPRIM 800-160 MG PO TABS: 1.0000 | ORAL_TABLET | Freq: Two times a day (BID) | ORAL | 0 refills | Status: AC

## 2021-05-24 NOTE — Telephone Encounter (Signed)
-----   Message from Sabrina Sou, MD sent at 05/23/2021 10:35 PM EST ----- Pls notify pt that the antibiotic I rx'd her last week (cipro) will probably not kill the bacteria in her urine.  Stop cipro and eRx bactrim DS, 1 bid x 5d, #10, no RF. After finishing this then restart cipro 250 mg daily as she has been on long term.-thx

## 2021-06-22 ENCOUNTER — Ambulatory Visit (INDEPENDENT_AMBULATORY_CARE_PROVIDER_SITE_OTHER): Payer: Medicare HMO

## 2021-06-22 ENCOUNTER — Telehealth: Payer: Self-pay

## 2021-06-22 ENCOUNTER — Other Ambulatory Visit: Payer: Self-pay

## 2021-06-22 VITALS — BP 134/68 | HR 60 | Temp 98.0°F | Wt 132.1 lb

## 2021-06-22 DIAGNOSIS — N390 Urinary tract infection, site not specified: Secondary | ICD-10-CM

## 2021-06-22 DIAGNOSIS — Z0000 Encounter for general adult medical examination without abnormal findings: Secondary | ICD-10-CM | POA: Diagnosis not present

## 2021-06-22 LAB — POCT URINALYSIS DIPSTICK
Bilirubin, UA: NEGATIVE
Glucose, UA: NEGATIVE
Nitrite, UA: NEGATIVE
Protein, UA: POSITIVE — AB
Spec Grav, UA: 1.02 (ref 1.010–1.025)
Urobilinogen, UA: 0.2 E.U./dL
pH, UA: 6 (ref 5.0–8.0)

## 2021-06-22 MED ORDER — AMOXICILLIN-POT CLAVULANATE 875-125 MG PO TABS: 1.0000 | ORAL_TABLET | Freq: Two times a day (BID) | ORAL | 0 refills | Status: DC

## 2021-06-22 NOTE — Patient Instructions (Signed)
Ms. Sabrina Alvarez , Thank you for taking time to come for your Medicare Wellness Visit. I appreciate your ongoing commitment to your health goals. Please review the following plan we discussed and let me know if I can assist you in the future.   Screening recommendations/referrals: Colonoscopy: No longer required  Mammogram: No longer required Recommended yearly ophthalmology/optometry visit for glaucoma screening and checkup Recommended yearly dental visit for hygiene and checkup  Vaccinations: Influenza vaccine: Done 03/29/21 Pneumococcal vaccine: Up to date Tdap vaccine: Done 04/19/18 Shingles vaccine: 1st dose 07/19/17   Covid-19:Completed 1/26, 07/22/19 & 04/09/20, 01/05/21  Advanced directives: Copies in chart   Conditions/risks identified: exercise more   Next appointment: Follow up in one year for your annual wellness visit    Preventive Care 86 Years and Older, Female Preventive care refers to lifestyle choices and visits with your health care provider that can promote health and wellness. What does preventive care include? A yearly physical exam. This is also called an annual well check. Dental exams once or twice a year. Routine eye exams. Ask your health care provider how often you should have your eyes checked. Personal lifestyle choices, including: Daily care of your teeth and gums. Regular physical activity. Eating a healthy diet. Avoiding tobacco and drug use. Limiting alcohol use. Practicing safe sex. Taking low-dose aspirin every day. Taking vitamin and mineral supplements as recommended by your health care provider. What happens during an annual well check? The services and screenings done by your health care provider during your annual well check will depend on your age, overall health, lifestyle risk factors, and family history of disease. Counseling  Your health care provider may ask you questions about your: Alcohol use. Tobacco use. Drug use. Emotional  well-being. Home and relationship well-being. Sexual activity. Eating habits. History of falls. Memory and ability to understand (cognition). Work and work Statistician. Reproductive health. Screening  You may have the following tests or measurements: Height, weight, and BMI. Blood pressure. Lipid and cholesterol levels. These may be checked every 5 years, or more frequently if you are over 45 years old. Skin check. Lung cancer screening. You may have this screening every year starting at age 36 if you have a 30-pack-year history of smoking and currently smoke or have quit within the past 15 years. Fecal occult blood test (FOBT) of the stool. You may have this test every year starting at age 89. Flexible sigmoidoscopy or colonoscopy. You may have a sigmoidoscopy every 5 years or a colonoscopy every 10 years starting at age 27. Hepatitis C blood test. Hepatitis B blood test. Sexually transmitted disease (STD) testing. Diabetes screening. This is done by checking your blood sugar (glucose) after you have not eaten for a while (fasting). You may have this done every 1-3 years. Bone density scan. This is done to screen for osteoporosis. You may have this done starting at age 17. Mammogram. This may be done every 1-2 years. Talk to your health care provider about how often you should have regular mammograms. Talk with your health care provider about your test results, treatment options, and if necessary, the need for more tests. Vaccines  Your health care provider may recommend certain vaccines, such as: Influenza vaccine. This is recommended every year. Tetanus, diphtheria, and acellular pertussis (Tdap, Td) vaccine. You may need a Td booster every 10 years. Zoster vaccine. You may need this after age 67. Pneumococcal 13-valent conjugate (PCV13) vaccine. One dose is recommended after age 40. Pneumococcal polysaccharide (PPSV23) vaccine. One  dose is recommended after age 36. Talk to your  health care provider about which screenings and vaccines you need and how often you need them. This information is not intended to replace advice given to you by your health care provider. Make sure you discuss any questions you have with your health care provider. Document Released: 06/18/2015 Document Revised: 02/09/2016 Document Reviewed: 03/23/2015 Elsevier Interactive Patient Education  2017 [place] Prevention in the Home Falls can cause injuries. They can happen to people of all ages. There are many things you can do to make your home safe and to help prevent falls. What can I do on the outside of my home? Regularly fix the edges of walkways and driveways and fix any cracks. Remove anything that might make you trip as you walk through a door, such as a raised step or threshold. Trim any bushes or trees on the path to your home. Use bright outdoor lighting. Clear any walking paths of anything that might make someone trip, such as rocks or tools. Regularly check to see if handrails are loose or broken. Make sure that both sides of any steps have handrails. Any raised decks and porches should have guardrails on the edges. Have any leaves, snow, or ice cleared regularly. Use sand or salt on walking paths during winter. Clean up any spills in your garage right away. This includes oil or grease spills. What can I do in the bathroom? Use night lights. Install grab bars by the toilet and in the tub and shower. Do not use towel bars as grab bars. Use non-skid mats or decals in the tub or shower. If you need to sit down in the shower, use a plastic, non-slip stool. Keep the floor dry. Clean up any water that spills on the floor as soon as it happens. Remove soap buildup in the tub or shower regularly. Attach bath mats securely with double-sided non-slip rug tape. Do not have throw rugs and other things on the floor that can make you trip. What can I do in the bedroom? Use night  lights. Make sure that you have a light by your bed that is easy to reach. Do not use any sheets or blankets that are too big for your bed. They should not hang down onto the floor. Have a firm chair that has side arms. You can use this for support while you get dressed. Do not have throw rugs and other things on the floor that can make you trip. What can I do in the kitchen? Clean up any spills right away. Avoid walking on wet floors. Keep items that you use a lot in easy-to-reach places. If you need to reach something above you, use a strong step stool that has a grab bar. Keep electrical cords out of the way. Do not use floor polish or wax that makes floors slippery. If you must use wax, use non-skid floor wax. Do not have throw rugs and other things on the floor that can make you trip. What can I do with my stairs? Do not leave any items on the stairs. Make sure that there are handrails on both sides of the stairs and use them. Fix handrails that are broken or loose. Make sure that handrails are as long as the stairways. Check any carpeting to make sure that it is firmly attached to the stairs. Fix any carpet that is loose or worn. Avoid having throw rugs at the top or bottom of the  stairs. If you do have throw rugs, attach them to the floor with carpet tape. Make sure that you have a light switch at the top of the stairs and the bottom of the stairs. If you do not have them, ask someone to add them for you. What else can I do to help prevent falls? Wear shoes that: Do not have high heels. Have rubber bottoms. Are comfortable and fit you well. Are closed at the toe. Do not wear sandals. If you use a stepladder: Make sure that it is fully opened. Do not climb a closed stepladder. Make sure that both sides of the stepladder are locked into place. Ask someone to hold it for you, if possible. Clearly mark and make sure that you can see: Any grab bars or handrails. First and last  steps. Where the edge of each step is. Use tools that help you move around (mobility aids) if they are needed. These include: Canes. Walkers. Scooters. Crutches. Turn on the lights when you go into a dark area. Replace any light bulbs as soon as they burn out. Set up your furniture so you have a clear path. Avoid moving your furniture around. If any of your floors are uneven, fix them. If there are any pets around you, be aware of where they are. Review your medicines with your doctor. Some medicines can make you feel dizzy. This can increase your chance of falling. Ask your doctor what other things that you can do to help prevent falls. This information is not intended to replace advice given to you by your health care provider. Make sure you discuss any questions you have with your health care provider. Document Released: 03/18/2009 Document Revised: 10/28/2015 Document Reviewed: 06/26/2014 Elsevier Interactive Patient Education  2017 [employer].

## 2021-06-22 NOTE — Addendum Note (Signed)
Addended by: Octaviano Glow on: 06/22/2021 03:27 PM   Modules accepted: Orders

## 2021-06-22 NOTE — Telephone Encounter (Signed)
-----   Message from Tammi Sou, MD sent at 06/22/2021  4:58 PM EST ----- Please notify patient that her urine did show signs of infection.  Please send in Augmentin 875, 1 tab po bid x 5d, #10, no RF. Stop Cipro while taking Augmentin.  When finished with Augmentin restart daily Cipro.

## 2021-06-22 NOTE — Telephone Encounter (Signed)
See result note from patient's UA today.

## 2021-06-22 NOTE — Progress Notes (Addendum)
Subjective:   Sabrina Alvarez is a 88 y.o. female who presents for Medicare Annual (Subsequent) preventive examination.and son jeffery   Review of Systems     Cardiac Risk Factors include: advanced age (>77men, >39 women);dyslipidemia;hypertension     Objective:    Today's Vitals   06/22/21 1443  BP: 134/68  Pulse: 60  Temp: 98 F (36.7 C)  SpO2: 99%  Weight: 132 lb 1.9 oz (59.9 kg)   Body mass index is 22.68 kg/m.  Advanced Directives 06/22/2021 01/10/2021 03/23/2020 04/19/2018 06/29/2017 07/11/2016 06/22/2015  Does Patient Have a Medical Advance Directive? Yes No Yes Yes Yes Yes Yes  Type of Academic librarian - Living will [place];Living will [place];Living will Living will [place];Living will  Does patient want to make changes to medical advance directive? - - No - Patient declined - - - No - Patient declined  Copy of [place] in Chart? Yes - validated most recent copy scanned in chart (See row information) - - - No - copy requested - Yes  Would patient like information on creating a medical advance directive? - - No - Patient declined - - - -    Current Medications (verified) Outpatient Encounter Medications as of 06/22/2021  Medication Sig   amiodarone (PACERONE) 200 MG tablet Take 200 mg by mouth daily. Take 1/2 pill daily by cardiologist   amLODipine (NORVASC) 10 MG tablet Take 1 tablet (10 mg total) by mouth daily.   aspirin EC 81 MG tablet Take 81 mg by mouth daily. Swallow whole.   atorvastatin (LIPITOR) 40 MG tablet Take 1 tablet (40 mg total) by mouth daily.   cevimeline (EVOXAC) 30 MG capsule TAKE 1 CAPSULE 3 TIMES A DAY FOR TREATMENT OF CHRONIC DRY MOUTH (Patient taking differently: in the morning and at bedtime. TAKE 1 CAPSULE 3 TIMES A DAY FOR TREATMENT OF CHRONIC DRY MOUTH)   Cholecalciferol (VITAMIN D3) 1000 UNITS CAPS Take 1,000 Units by mouth daily.    ciprofloxacin (CIPRO) 250 MG tablet TAKE 1 TABLET BY MOUTH EVERY DAY FOR BLADDER INFECTION PROPHYLAXIS.   cloNIDine (CATAPRES - DOSED IN MG/24 HR) 0.1 mg/24hr patch Place 1 patch (0.1 mg total) onto the skin once a week.   FERROUS SULFATE PO Take 1 tablet by mouth daily.    fluticasone (FLONASE) 50 MCG/ACT nasal spray SPRAY 2 SPRAYS INTO EACH NOSTRIL EVERY DAY (Patient taking differently: Place 1 spray into both nostrils daily as needed for allergies.)   irbesartan (AVAPRO) 150 MG tablet TAKE 1 TABLET BY MOUTH EVERY DAY   Omega-3 Fatty Acids (FISH OIL) 1000 MG CAPS Take 1 capsule by mouth daily.   oxybutynin (DITROPAN) 5 MG tablet Take 1 tablet (5 mg total) by mouth every other day.   pantoprazole (PROTONIX) 40 MG tablet Take 1 tablet (40 mg total) by mouth daily.   polyethylene glycol powder (GLYCOLAX/MIRALAX) powder TAKE 17 G BY MOUTH 2 (TWO) TIMES DAILY.   nitroGLYCERIN (NITROSTAT) 0.4 MG SL tablet Place 1 tablet (0.4 mg total) under the tongue every 5 (five) minutes as needed for chest pain. (Patient not taking: Reported on 05/20/2021)   No facility-administered encounter medications on file as of 06/22/2021.    Allergies (verified) Cefdinir and Macrobid [nitrofurantoin macrocrystal]   History: Past Medical History:  Diagnosis Date   Arthritis    "think it's osteo; got some in my hands"   Atrial fibrillation ([place]) 02/10/2020  Dr. Roland Rack, [employer] cards-[place],ASA and BB (stopped her ARB)-in sinus-monitor planned but pt hosp for acute R CVA, eliquis started and pt remained on rate control   Bifascicular block    CAD (coronary artery disease)    Cath 2009  Occluded LAD, 40-50% RCA and circ managed medically.  Repeat 06/22/15 no change.   Chronic constipation    slow transit.  Barium enema to eval for colonic stricture 10/2014 was NORMAL   COPD (chronic obstructive pulmonary disease) (HCC)    Changes noted on CXR 2017   CVA (cerebral vascular accident) ([place]) 03/2020   L sided weakness;  a-fib-->eliquis + rate control   Dementia (HCC)    GERD (gastroesophageal reflux disease)    (also LPR) Schatzki's ring, s/p dil '97; food impaction 08/2010, on PPI therapy since w/out further sx so no dil performed   History of herpes zoster 06/2015   right side of chest; presented with chest pain mimicking [place]--cardiac eval showed stable CAD compared to 2009.   Hyperkalemia 06/2017   suspected due to increase of losartan from 25mg  bid to 50mg  bid.  Dose lowered back to 25 mg bid K normalized.  After increase again to 50 qAM and 25 qPM potassium 4.8 so I did not increase dose any further (07/20/17).   Hyperlipidemia    statin started in hosp for cva 03/2020   Hypertension    permissive elevated bp ok   Hyponatremia 10/03/2011   Na 131 on labs 02/2015 at [facility].  Baseline Na 132.   Iron deficiency anemia 11/30/15; 09/2019   Occult GI bleed: 09/2019 Hb drop to 8.5, iron low, hemoccult +.  Oral iron started->Dr. Buccini eval->heme neg in his office->obs/follow Hb on oral Fe; order BE if Hb not responding or having ongoing heme+ stool. Then hosp for a-fib + CVA, eliquis started b/c no overt bleeding noted.   Mixed stress and urge urinary incontinence    RBBB with left anterior fascicular block    bifascicular block   Recurrent UTI    Bactrim prophyl, then changed to cefdinir (?med rxn?).  Cipro qd as of 08/2018.   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  ANA and sjogren's panel NEG 06/2017.   Past Surgical History:  Procedure Laterality Date   ABDOMINAL HYSTERECTOMY  2000   APPENDECTOMY  2000   ATRIAL FLUTTER ABLATION  06/03/2020   CARDIAC CATHETERIZATION N/A 06/22/2015   Stable single vessel CAD, EF normal.  Procedure:  Left Heart Cath and Coronary Angiography;  Surgeon: Leonie Man, MD;  Location: [place] [facility];  Service: Cardiovascular;  Laterality: N/A;   CATARACT EXTRACTION W/ INTRAOCULAR LENS  IMPLANT, BILATERAL  1990's   CESAREAN SECTION  1959; 1960   COLONOSCOPY  04/2001   BE neg 10/2014   DILATION AND CURETTAGE OF UTERUS     ESOPHAGOGASTRODUODENOSCOPY  1997   for dysphagia->esoph dilatation done   HERNIA REPAIR     abdominal "twice"   LEFT HEART CATHETERIZATION WITH CORONARY ANGIOGRAM N/A 10/04/2011   Procedure: LEFT HEART CATHETERIZATION WITH CORONARY ANGIOGRAM;  Surgeon: Jacolyn Reedy, MD;  Location: [facility] CATH LAB;  Service: Cardiovascular;  Laterality: N/A;   REPAIR KNEE LIGAMENT  ~  2008   left   Rhythm monitoring  11/02/2020   48H holter->NSR w/1st deg AV block, occ PADs, rare PVCs, frequent sinus brady, lowest HR 47, no pauses.   TONSILLECTOMY     as a child   TRANSTHORACIC ECHOCARDIOGRAM  05/2013; 02/2020; 03/24/20   02/2020 (new dx a-fib) EF 60-65%, valves ok, no wall motion abnorm. 03/2020, pt in a-fib/flutter EF 55-60%, valves ok, small peric effus.    Family History  Problem Relation Age of Onset   Arthritis Mother    AAA (abdominal aortic aneurysm) Mother    Diabetes Sister    CVA Father    Rheum arthritis Sister    Cancer Neg Hx    Heart disease Neg Hx    Thyroid disease Neg Hx    Social History   Socioeconomic History   Marital status: Widowed    Spouse name: Not on file   Number of children: 3   Years of education: Not on file   Highest education level: Not on file  Occupational History   Not on file  Tobacco Use   Smoking status: Never   Smokeless tobacco: Never  Vaping Use   Vaping Use: Never used  Substance and Sexual Activity   Alcohol use: No   Drug use: No   Sexual activity: Not on file  Other Topics Concern   Not on file  Social History Narrative   Widow, lives alone.  Three sons, one deceased.   Orig from [facility].     Educ: HS.   Occup:  retired.  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   No T/A/Ds.   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   Social Determinants of Radio broadcast assistant Strain: Low Risk    Difficulty of Paying Living Expenses: Not hard at all  Food Insecurity: No Food Insecurity   Worried About Charity fundraiser in the Last Year: Never true   Arboriculturist in the Last Year: Never true  Transportation Needs: No Transportation Needs   Lack of Transportation (Medical): No   Lack of Transportation (Non-Medical): No  Physical Activity: Inactive   Days of Exercise per Week: 0 days   Minutes of Exercise per Session: 0 min  Stress: No Stress Concern Present   Feeling of Stress : Not at all  Social Connections: Moderately Isolated   Frequency of Communication with Friends and Family: More than three times a week   Frequency of Social Gatherings with Friends and Family: More than three times a week   Attends Religious Services: More than 4 times per year   Active Member of [employer] or Organizations: No   Attends Archivist Meetings: Never   Marital Status: Widowed    Tobacco Counseling Counseling given: Not Answered   Clinical Intake:  Pre-visit preparation completed: Yes  Pain : No/denies pain     BMI - recorded: 22.68 Nutritional Status: BMI of 19-24  Normal Nutritional Risks: None Diabetes: No  How often do you need to have someone help you when you read instructions, pamphlets, or other written materials from your doctor or pharmacy?: 1 - Never  Diabetic?no  Interpreter Needed?: No  Information entered by :: Charlott Rakes, LPN   Activities of Daily Living In your present state of health, do you have any difficulty performing the following activities: 06/22/2021  Hearing? Y  Comment hearing aids  Vision? N  Difficulty concentrating or making decisions? N  Walking or climbing  stairs? Y  Comment pt uses walker no stairs  Dressing or bathing? N   Doing errands, shopping? N  Preparing Food and eating ? N  Using the Toilet? N  Managing your Medications? N  Managing your Finances? N  Housekeeping or managing your Housekeeping? Y  Comment pt has assistance at all times  Some recent data might be hidden    Patient Care Team: Tammi Sou, MD as PCP - General (Family Medicine) Jacolyn Reedy, MD as Consulting Physician (Cardiology) Lomax, Marny Lowenstein, MD (Inactive) as Consulting Physician (Gynecology) Ronald Lobo, MD as Consulting Physician (Gastroenterology) Bjorn Loser, MD as Consulting Physician (Urology) Sydnee Levans, MD as Referring Physician (Dermatology)  Indicate any recent Medical Services you may have received from other than [facility] providers in the past year (date may be approximate).     Assessment:   This is a routine wellness examination for [facility].  Hearing/Vision screen Hearing Screening - Comments:: Wears hearing aids  Vision Screening - Comments:: Pt follows up with Dr Rochel Brome for annual eye exams   Dietary issues and exercise activities discussed: Current Exercise Habits: The patient does not participate in regular exercise at present   Goals Addressed             This Visit's Progress    Patient Stated       Get back to exercise        Depression Screen PHQ 2/9 Scores 06/22/2021 05/09/2021 01/10/2021 01/30/2020 10/04/2018 06/29/2017 06/06/2017  PHQ - 2 Score 0 0 0 0 0 0 0    Fall Risk Fall Risk  06/22/2021 05/09/2021 09/06/2020 01/30/2020 10/04/2018  Falls in the past year? 0 0 1 1 1   Comment - - - - -  Number falls in past yr: 0 0 1 0 1  Injury with Fall? 0 0 0 1 0  Risk for fall due to : Impaired balance/gait;Impaired vision - Impaired balance/gait;Impaired mobility - -  Follow up Falls prevention discussed Falls evaluation completed - Falls evaluation completed Falls evaluation completed    FALL RISK PREVENTION PERTAINING TO THE HOME:  Any stairs in or around the home? Yes   If so, are there any without handrails? No  Home free of loose throw rugs in walkways, pet beds, electrical cords, etc? Yes  Adequate lighting in your home to reduce risk of falls? Yes   ASSISTIVE DEVICES UTILIZED TO PREVENT FALLS:  Life alert? Yes  Use of a cane, walker or w/c? Yes  Grab bars in the bathroom? Yes  Shower chair or bench in shower? Yes  Elevated toilet seat or a handicapped toilet? No   TIMED UP AND GO:  Was the test performed? Yes .  Length of time to ambulate 10 feet: 10 sec.   Gait steady and fast with assistive device  Cognitive Function: MMSE - Mini Mental State Exam 06/29/2017  Orientation to time 5  Orientation to Place 5  Registration 3  Attention/ Calculation 5  Recall 1  Language- name 2 objects 2  Language- repeat 1  Language- follow 3 step command 3  Language- read & follow direction 1  Write a sentence 1  Copy design 1  Total score 28     6CIT Screen 06/22/2021  What Year? 4 points  What month? 3 points  What time? 0 points  Count back from 20 0 points  Months in reverse 4 points  Repeat phrase 8 points  Total Score 19    Immunizations Immunization  History  Administered Date(s) Administered   Fluad Quad(high Dose 65+) 03/06/2019, 03/26/2020, 03/29/2021   Influenza Split 04/05/2006, 02/04/2008, 03/30/2009, 03/27/2011   Influenza, High Dose Seasonal PF 02/21/2010, 02/05/2014, 04/15/2015, 02/24/2016, 04/05/2018   Influenza-Unspecified 03/25/2012, 03/27/2013, 04/15/2015, 02/20/2017   PFIZER(Purple Top)SARS-COV-2 Vaccination 07/01/2019, 07/22/2019, 04/09/2020, 01/05/2021   Pneumococcal Conjugate-13 04/15/2015   Pneumococcal Polysaccharide-23 04/15/1999, 03/30/2009, 07/20/2016   Tdap 04/19/2018   Zoster Recombinat (Shingrix) 07/19/2017   Zoster, Live 04/05/2006    TDAP status: Up to date  Flu Vaccine status: Up to date  Pneumococcal vaccine status: Up to date  Covid-19 vaccine status: Completed vaccines  Qualifies for Shingles  Vaccine? Yes   Zostavax completed Yes   Shingrix Completed?: No.    Education has been provided regarding the importance of this vaccine. Patient has been advised to call insurance company to determine out of pocket expense if they have not yet received this vaccine. Advised may also receive vaccine at local pharmacy or Health Dept. Verbalized acceptance and understanding.  Screening Tests Health Maintenance  Topic Date Due   DEXA SCAN  Never done   Zoster Vaccines- Shingrix (2 of 2) 09/13/2017   COVID-19 Vaccine (5 - Booster for Pfizer series) 03/02/2021   TETANUS/TDAP  04/19/2028   Pneumonia Vaccine 51+ Years old  Completed   INFLUENZA VACCINE  Completed   HPV VACCINES  Aged Out    Health Maintenance  Health Maintenance Due  Topic Date Due   DEXA SCAN  Never done   Zoster Vaccines- Shingrix (2 of 2) 09/13/2017   COVID-19 Vaccine (5 - Booster for Pfizer series) 03/02/2021    Colorectal cancer screening: No longer required.   Mammogram status: No longer required due to age.     Additional Screening:   Vision Screening: Recommended annual ophthalmology exams for early detection of glaucoma and other disorders of the eye. Is the patient up to date with their annual eye exam?  Yes  Who is the provider or what is the name of the office in which the patient attends annual eye exams? Dr Rochel Brome If pt is not established with a provider, would they like to be referred to a provider to establish care? No .   Dental Screening: Recommended annual dental exams for proper oral hygiene  Community Resource Referral / Chronic Care Management: CRR required this visit?  No   CCM required this visit?  No      Plan:     I have personally reviewed and noted the following in the patients chart:   Medical and social history Use of alcohol, tobacco or illicit drugs  Current medications and supplements including opioid prescriptions.  Functional ability and status Nutritional  status Physical activity Advanced directives List of other physicians Hospitalizations, surgeries, and ER visits in previous 12 months Vitals Screenings to include cognitive, depression, and falls Referrals and appointments  In addition, I have reviewed and discussed with patient certain preventive protocols, quality metrics, and best practice recommendations. A written personalized care plan for preventive services as well as general preventive health recommendations were provided to patient.     Willette Brace, LPN   [id number]   Nurse Notes: pt son jeffrey expressed concern for her memory and urine will be left as directed

## 2021-06-22 NOTE — Telephone Encounter (Signed)
Pt has here for Medicare Wellness visit and seemed to have symptoms of UTI. Pt is on preventive medication, cipro 250mg . Provider verbally said ok for UA and will decide from there on medication. Please review results and advise

## 2021-06-22 NOTE — Telephone Encounter (Signed)
Spoke with pt regarding results/recommendations,voiced understanding. ? ?

## 2021-08-09 ENCOUNTER — Telehealth: Payer: Self-pay | Admitting: Family Medicine

## 2021-08-09 NOTE — Telephone Encounter (Signed)
Noted. Form has not been received yet. ?

## 2021-08-09 NOTE — Telephone Encounter (Signed)
Caller Name: Dellis Filbert, son ?Call back phone #: [phone] ? ?Reason for Call: [employer] will be sending a cognitive assessment to be completed by Dr. Anitra Lauth. Please call Dellis Filbert once received if there are questions. Pt does still have 24 hr care. ? ?

## 2021-08-23 NOTE — Telephone Encounter (Signed)
Placed on PCP desk to review and sign, if appropriate.  

## 2021-08-23 NOTE — Telephone Encounter (Signed)
Form completed. ?$20 charge please ?

## 2021-08-23 NOTE — Telephone Encounter (Signed)
Form faxed

## 2021-09-15 NOTE — Telephone Encounter (Signed)
Informed pt that form was faxed back on March 21/2023. ? ?Pt said he wants a copy, made copy for him & he will pick up sometime today. ?

## 2021-09-15 NOTE — Telephone Encounter (Signed)
Noted  

## 2021-09-19 ENCOUNTER — Encounter: Payer: Self-pay | Admitting: Family Medicine

## 2021-09-19 ENCOUNTER — Ambulatory Visit (INDEPENDENT_AMBULATORY_CARE_PROVIDER_SITE_OTHER): Payer: Medicare HMO | Admitting: Family Medicine

## 2021-09-19 VITALS — BP 104/62 | HR 67 | Temp 98.3°F | Ht 64.0 in | Wt 132.6 lb

## 2021-09-19 DIAGNOSIS — H6121 Impacted cerumen, right ear: Secondary | ICD-10-CM

## 2021-09-19 NOTE — Progress Notes (Signed)
OFFICE VISIT ? ?09/19/2021 ? ?CC:  ?Chief Complaint  ?Patient presents with  ? Cerumen Impaction  ? ? ?Patient is a 88 y.o. female who presents for "ear wax buildup". ? ?HPI: ?Sabrina Alvarez went to audiology recently for hearing aid checkup. ?She has too much wax buildup for them to do any testing.  They recommended she come here to clean them out. ?She does not have any pain or change in hearing . ? ?Past Medical History:  ?Diagnosis Date  ? Arthritis   ? "think it's osteo; got some in my hands"  ? Atrial fibrillation ([place]) 02/10/2020  ? Dr. Roland Rack, [employer] cards-[place],ASA and BB (stopped her ARB)-in sinus-monitor planned but pt hosp for acute R CVA, eliquis started and pt remained on rate control  ? Bifascicular block   ? CAD (coronary artery disease)   ? Cath 2009  Occluded LAD, 40-50% RCA and circ managed medically.  Repeat 06/22/15 no change.  ? Chronic constipation   ? slow transit.  Barium enema to eval for colonic stricture 10/2014 was NORMAL  ? COPD (chronic obstructive pulmonary disease) ([place])   ? Changes noted on CXR 2017  ? CVA (cerebral vascular accident) ([place]) 03/2020  ? L sided weakness; a-fib-->eliquis + rate control  ? Dementia ([place])   ? GERD (gastroesophageal reflux disease)   ? (also LPR) Schatzki's ring, s/p dil '97; food impaction 08/2010, on PPI therapy since w/out further sx so no dil performed  ? History of herpes zoster 06/2015  ? right side of chest; presented with chest pain mimicking [place]--cardiac eval showed stable CAD compared to 2009.  ? Hyperkalemia 06/2017  ? suspected due to increase of losartan from '25mg'$  bid to '50mg'$  bid.  Dose lowered back to 25 mg bid K normalized.  After increase again to 50 qAM and 25 qPM potassium 4.8 so I did not increase dose any further (07/20/17).  ? Hyperlipidemia   ? statin started in hosp for cva 03/2020  ? Hypertension   ? permissive elevated bp ok  ? Hyponatremia 10/03/2011  ? Na 131 on labs 02/2015 at [facility].  Baseline Na 132.  ? Iron deficiency anemia 11/30/15;  09/2019  ? Occult GI bleed: 09/2019 Hb drop to 8.5, iron low, hemoccult +.  Oral iron started->Dr. Buccini eval->heme neg in his office->obs/follow Hb on oral Fe; order BE if Hb not responding or having ongoing heme+ stool. Then hosp for a-fib + CVA, eliquis started b/c no overt bleeding noted.  ? Mixed stress and urge urinary incontinence   ? RBBB with left anterior fascicular block   ? bifascicular block  ? Recurrent UTI   ? Bactrim prophyl, then changed to cefdinir (?med rxn?).  Cipro qd as of 08/2018.  ? Third nerve palsy 05/2013  ? presented as diplopia; felt by neuro to be microvascular insult so no carotid dopplers needed (CT and MRI neg for CVA)  ? Thrombocytopenia ([place]) 02/2013  ? Plts 114K  ? Uterine cancer ([place]) 2000  ? Xerostomia 2017/2018  ? Age related glandular atrophy + med effect (oxybutynin and gabapentin).  ANA and sjogren's panel NEG 06/2017.  ? ? ?Past Surgical History:  ?Procedure Laterality Date  ? ABDOMINAL HYSTERECTOMY  2000  ? APPENDECTOMY  2000  ? ATRIAL FLUTTER ABLATION  06/03/2020  ? CARDIAC CATHETERIZATION N/A 06/22/2015  ? Stable single vessel CAD, EF normal.  Procedure: Left Heart Cath and Coronary Angiography;  Surgeon: Leonie Man, MD;  Location: [facility] CV LAB;  Service: Cardiovascular;  Laterality: N/A;  ? CATARACT EXTRACTION W/ INTRAOCULAR LENS  IMPLANT, BILATERAL  1990's  ? [place]; 1960  ? COLONOSCOPY  04/2001  ? BE neg 10/2014  ? DILATION AND CURETTAGE OF UTERUS    ? ESOPHAGOGASTRODUODENOSCOPY  1997  ? for dysphagia->esoph dilatation done  ? HERNIA REPAIR    ? abdominal "twice"  ? LEFT HEART CATHETERIZATION WITH CORONARY ANGIOGRAM N/A 10/04/2011  ? Procedure: LEFT HEART CATHETERIZATION WITH CORONARY ANGIOGRAM;  Surgeon: Jacolyn Reedy, MD;  Location: [facility] CATH LAB;  Service: Cardiovascular;  Laterality: N/A;  ? REPAIR KNEE LIGAMENT  ~ 2008  ? left  ? Rhythm monitoring  11/02/2020  ? 48H holter->NSR w/1st deg AV block, occ PADs, rare PVCs, frequent sinus brady,  lowest HR 47, no pauses.  ? TONSILLECTOMY    ? as a child  ? TRANSTHORACIC ECHOCARDIOGRAM  05/2013; 02/2020; 03/24/20  ? 02/2020 (new dx a-fib) EF 60-65%, valves ok, no wall motion abnorm. 03/2020, pt in a-fib/flutter EF 55-60%, valves ok, small peric effus.   ? ? ?Outpatient Medications Prior to Visit  ?Medication Sig Dispense Refill  ? amiodarone (PACERONE) 200 MG tablet Take 200 mg by mouth daily. Take 1/2 pill daily by cardiologist    ? amLODipine (NORVASC) 10 MG tablet Take 1 tablet (10 mg total) by mouth daily. 90 tablet 3  ? aspirin EC 81 MG tablet Take 81 mg by mouth daily. Swallow whole.    ? atorvastatin (LIPITOR) 40 MG tablet Take 1 tablet (40 mg total) by mouth daily. 30 tablet 0  ? cevimeline (EVOXAC) 30 MG capsule TAKE 1 CAPSULE 3 TIMES A DAY FOR TREATMENT OF CHRONIC DRY MOUTH (Patient taking differently: in the morning and at bedtime. TAKE 1 CAPSULE 3 TIMES A DAY FOR TREATMENT OF CHRONIC DRY MOUTH) 270 capsule 3  ? Cholecalciferol (VITAMIN D3) 1000 UNITS CAPS Take 1,000 Units by mouth daily.    ? ciprofloxacin (CIPRO) 250 MG tablet TAKE 1 TABLET BY MOUTH EVERY DAY FOR BLADDER INFECTION PROPHYLAXIS. 90 tablet 3  ? cloNIDine (CATAPRES - DOSED IN MG/24 HR) 0.1 mg/24hr patch Place 1 patch (0.1 mg total) onto the skin once a week. 12 patch 3  ? FERROUS SULFATE PO Take 1 tablet by mouth daily.     ? fluticasone (FLONASE) 50 MCG/ACT nasal spray SPRAY 2 SPRAYS INTO EACH NOSTRIL EVERY DAY (Patient taking differently: Place 1 spray into both nostrils daily as needed for allergies.) 48 mL 0  ? irbesartan (AVAPRO) 150 MG tablet TAKE 1 TABLET BY MOUTH EVERY DAY 90 tablet 3  ? nitroGLYCERIN (NITROSTAT) 0.4 MG SL tablet Place 1 tablet (0.4 mg total) under the tongue every 5 (five) minutes as needed for chest pain. 25 tablet 12  ? Omega-3 Fatty Acids (FISH OIL) 1000 MG CAPS Take 1 capsule by mouth daily.    ? oxybutynin (DITROPAN) 5 MG tablet Take 1 tablet (5 mg total) by mouth every other day. 45 tablet 3  ?  pantoprazole (PROTONIX) 40 MG tablet Take 1 tablet (40 mg total) by mouth daily. 30 tablet 1  ? polyethylene glycol powder (GLYCOLAX/MIRALAX) powder TAKE 17 G BY MOUTH 2 (TWO) TIMES DAILY. 527 g 5  ? amoxicillin-clavulanate (AUGMENTIN) 875-125 MG tablet Take 1 tablet by mouth 2 (two) times daily. 10 tablet 0  ? ?No facility-administered medications prior to visit.  ? ? ?Allergies  ?Allergen Reactions  ? Cefdinir Hives  ? Macrobid [Nitrofurantoin Macrocrystal] Other (See Comments)  ?  "I had chills &  fever"  ? ? ?ROS ?As per HPI ? ?PE: ? ?  09/19/2021  ?  1:17 PM 06/22/2021  ?  2:43 PM 05/20/2021  ?  4:11 PM  ?Vitals with BMI  ?Height '5\' 4"'$   '5\' 4"'$   ?Weight 132 lbs 10 oz 132 lbs 2 oz 135 lbs 3 oz  ?BMI 22.75  23.2  ?Systolic 338 250 539  ?Diastolic 62 68 70  ?Pulse 67 60 54  ? ?Physical Exam ? ?Gen: Alert, well appearing.  Patient is oriented to person, place, time, and situation. ?AFFECT: pleasant, lucid thought and speech. ?Ears: Right external auditory canal with complete blockage by cerumen.  Too deep to attempt curette extraction.  Cannot view TM. ?Left external auditory canal with mild to moderate level of cerumen but the TM is easily visualized ? No erythema or swelling of the canal epithelium on either side. ? ?LABS:  ?Last CBC ?Lab Results  ?Component Value Date  ? WBC 6.6 05/09/2021  ? HGB 11.6 (L) 05/09/2021  ? HCT 34.9 (L) 05/09/2021  ? MCV 92.2 05/09/2021  ? MCH 30.7 01/05/2021  ? RDW 13.7 05/09/2021  ? PLT 149.0 (L) 05/09/2021  ? ?Last metabolic panel ?Lab Results  ?Component Value Date  ? GLUCOSE 110 (H) 05/09/2021  ? NA 136 05/09/2021  ? K 4.4 05/09/2021  ? CL 102 05/09/2021  ? CO2 27 05/09/2021  ? BUN 30 (H) 05/09/2021  ? CREATININE 1.29 (H) 05/09/2021  ? GFRNONAA >60 03/26/2020  ? CALCIUM 8.7 05/09/2021  ? PROT 6.4 05/09/2021  ? ALBUMIN 4.1 05/09/2021  ? BILITOT 0.6 05/09/2021  ? ALKPHOS 55 05/09/2021  ? AST 18 05/09/2021  ? ALT 13 05/09/2021  ? ANIONGAP 12 03/26/2020  ? ?IMPRESSION AND PLAN: ? ?#1  cerumen impaction, right side.  Left side with mild to moderate amount of cerumen. ?Consent obtained. ?Procedure: Cerumen Disimpaction  ?Warm water was applied and gentle ear lavage performed on right. There were no

## 2021-09-29 ENCOUNTER — Other Ambulatory Visit: Payer: Self-pay | Admitting: Family Medicine

## 2021-12-11 ENCOUNTER — Telehealth: Payer: Self-pay | Admitting: Family Medicine

## 2021-12-12 ENCOUNTER — Other Ambulatory Visit: Payer: Self-pay | Admitting: Family Medicine

## 2021-12-13 NOTE — Telephone Encounter (Signed)
LM for pt's son(Jeff) to call back and let us know if pt is needing this med refilled or automatic request from pharmacy.

## 2021-12-16 MED ORDER — PANTOPRAZOLE SODIUM 40 MG PO TBEC: 40.0000 mg | DELAYED_RELEASE_TABLET | Freq: Every day | ORAL | 5 refills | Status: DC

## 2021-12-16 NOTE — Telephone Encounter (Addendum)
LM for pt's son Merry Proud advising new prescription sent to [place]. Okay per [facility]

## 2021-12-16 NOTE — Addendum Note (Signed)
Addended by: Deveron Furlong D on: 12/16/2021 04:49 PM   Modules accepted: Orders

## 2021-12-16 NOTE — Telephone Encounter (Signed)
Patient's son Merry Proud called back regarding Pantoprazole.  Yes, patient needs refill, she has about 10 pills left - but Merry Proud is going out of town for a couple of weeks, making sure she has all her meds before he leaves.  [facility]  pantoprazole (PROTONIX) 40 MG tablet [[id number]]

## 2021-12-29 ENCOUNTER — Other Ambulatory Visit: Payer: Self-pay | Admitting: Family Medicine

## 2022-01-02 ENCOUNTER — Other Ambulatory Visit: Payer: Self-pay | Admitting: Family Medicine

## 2022-01-12 ENCOUNTER — Other Ambulatory Visit: Payer: Self-pay | Admitting: Family Medicine

## 2022-01-30 DIAGNOSIS — K219 Gastro-esophageal reflux disease without esophagitis: Secondary | ICD-10-CM | POA: Diagnosis not present

## 2022-01-30 DIAGNOSIS — R32 Unspecified urinary incontinence: Secondary | ICD-10-CM | POA: Diagnosis not present

## 2022-01-30 DIAGNOSIS — M3500 Sicca syndrome, unspecified: Secondary | ICD-10-CM | POA: Diagnosis not present

## 2022-01-30 DIAGNOSIS — I2510 Atherosclerotic heart disease of native coronary artery without angina pectoris: Secondary | ICD-10-CM | POA: Diagnosis not present

## 2022-01-30 DIAGNOSIS — E785 Hyperlipidemia, unspecified: Secondary | ICD-10-CM | POA: Diagnosis not present

## 2022-01-30 DIAGNOSIS — I252 Old myocardial infarction: Secondary | ICD-10-CM | POA: Diagnosis not present

## 2022-01-30 DIAGNOSIS — I4891 Unspecified atrial fibrillation: Secondary | ICD-10-CM | POA: Diagnosis not present

## 2022-01-30 DIAGNOSIS — D6869 Other thrombophilia: Secondary | ICD-10-CM | POA: Diagnosis not present

## 2022-01-30 DIAGNOSIS — I10 Essential (primary) hypertension: Secondary | ICD-10-CM | POA: Diagnosis not present

## 2022-01-30 DIAGNOSIS — I4892 Unspecified atrial flutter: Secondary | ICD-10-CM | POA: Diagnosis not present

## 2022-01-30 DIAGNOSIS — M1990 Unspecified osteoarthritis, unspecified site: Secondary | ICD-10-CM | POA: Diagnosis not present

## 2022-01-30 DIAGNOSIS — N390 Urinary tract infection, site not specified: Secondary | ICD-10-CM | POA: Diagnosis not present

## 2022-03-27 IMAGING — MR MR HEAD W/O CM
5 series · 48 of 48 positions shown · non-contrast
Comparison: CT head, CTA head and neck earlier today. Brain MRI
05/30/2016, 05/30/2013.

CLINICAL DATA: [AGE] female code stroke presentation today.
Left hand weakness.

EXAM:
MRI HEAD WITHOUT CONTRAST
TECHNIQUE: Multiplanar, multiecho pulse sequences of the brain and surrounding
structures were obtained without intravenous contrast.

[Series 3: DWI · axial · 3.0mm · 1.09mm/px · z∈[-72,+72]mm · 18 of 98 slices shown (1 of 4)]
[im 1/98]
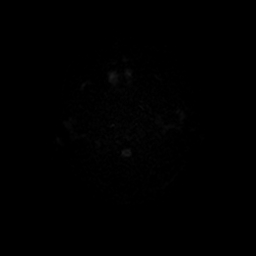
[im 6/98]
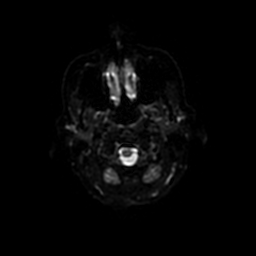
[im 12/98]
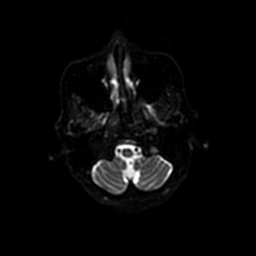
[im 18/98]
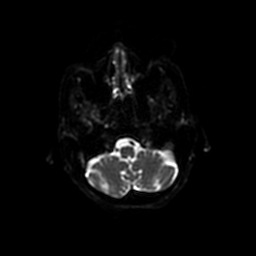
[im 23/98]
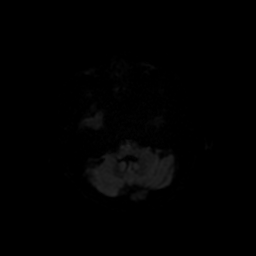
[im 29/98]
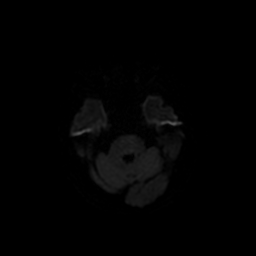
[im 35/98]
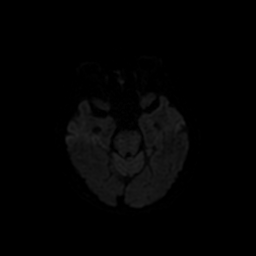
[im 40/98]
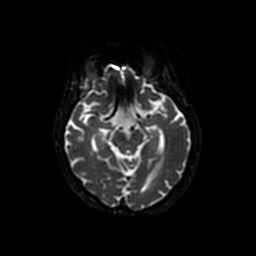
[im 46/98]
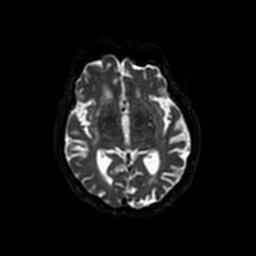
[im 52/98]
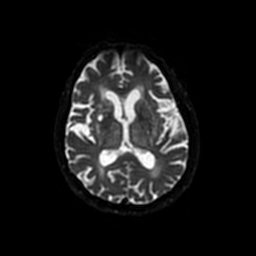
[im 58/98]
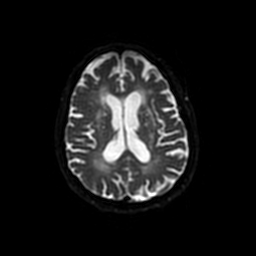
[im 63/98]
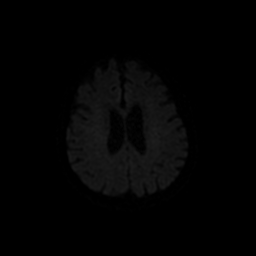
[im 69/98]
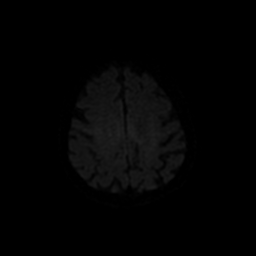
[im 75/98]
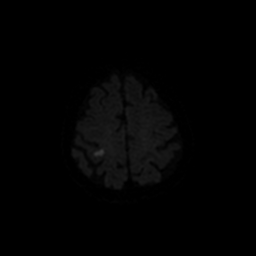
[im 80/98]
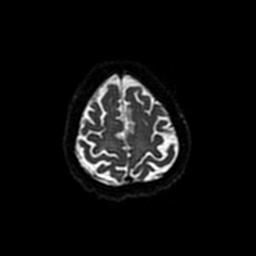
[im 86/98]
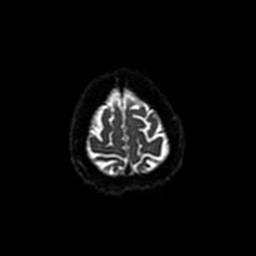
[im 92/98]
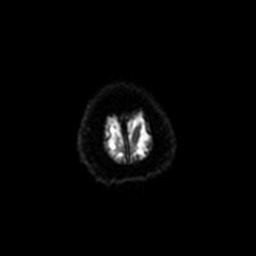
[im 98/98]
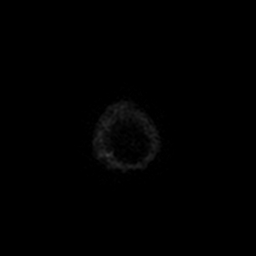

[Series 5: DWI · coronal · 5.0mm · 1.09mm/px · 12 of 72 slices shown (2 of 4)]
[im 1/72]
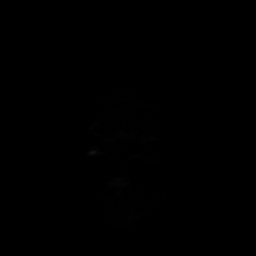
[im 7/72]
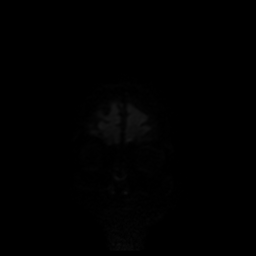
[im 13/72]
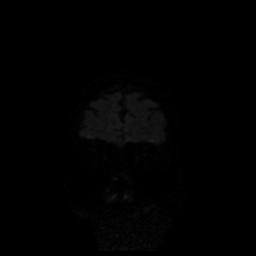
[im 20/72]
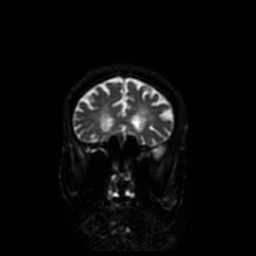
[im 26/72]
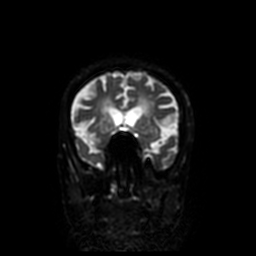
[im 33/72]
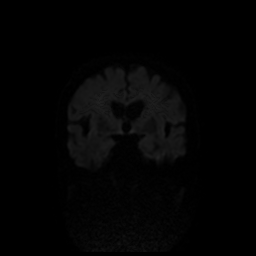
[im 39/72]
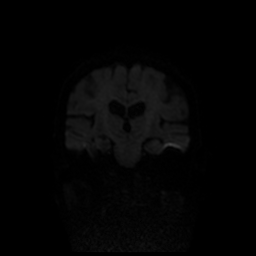
[im 46/72]
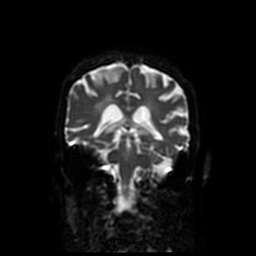
[im 52/72]
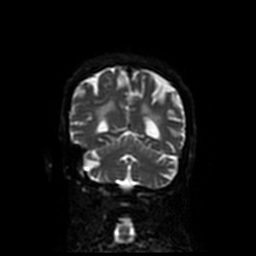
[im 59/72]
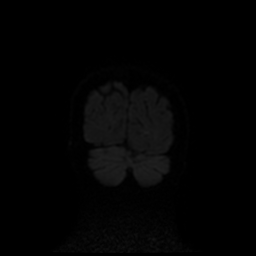
[im 65/72]
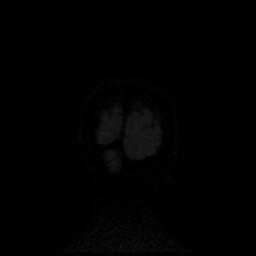
[im 72/72]
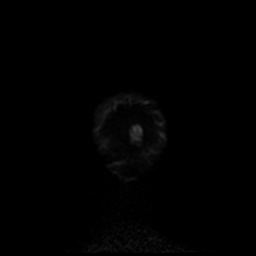

[Series 6: FLAIR · axial · 3.0mm · 0.43mm/px · z∈[-68,+75]mm · 4 of 25 slices shown]
[im 1/25]
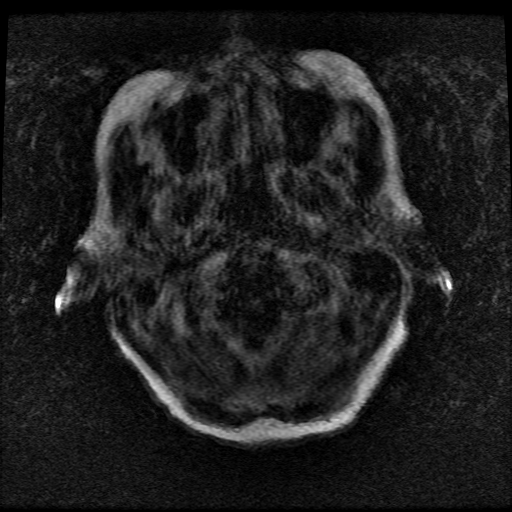
[im 9/25]
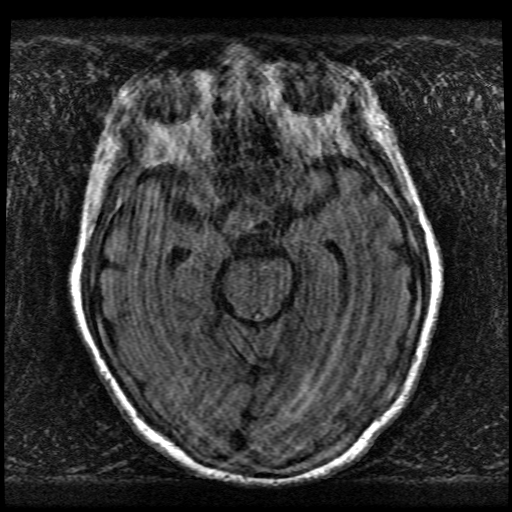
[im 17/25]
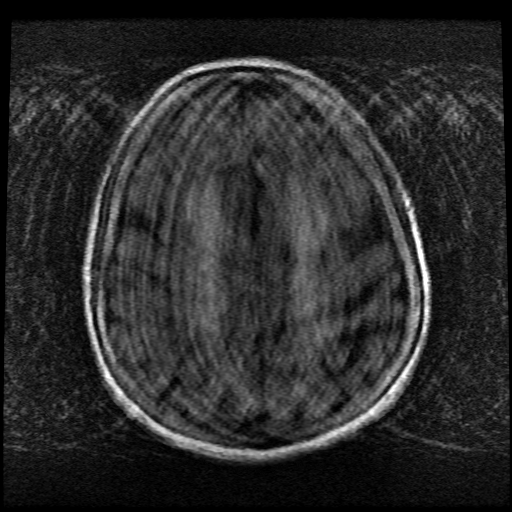
[im 25/25]
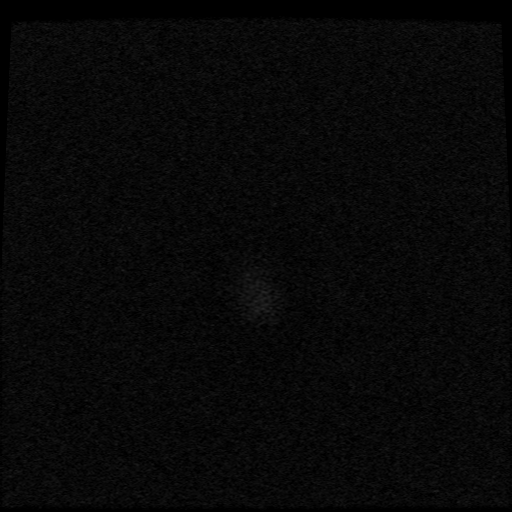

[Series 300: DWI · axial · 3.0mm · 1.09mm/px · z∈[-72,+72]mm · 8 of 49 slices shown (3 of 4)]
[im 1/49]
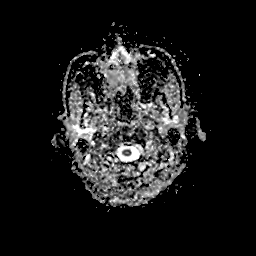
[im 7/49]
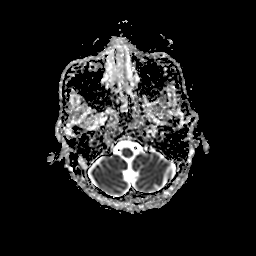
[im 14/49]
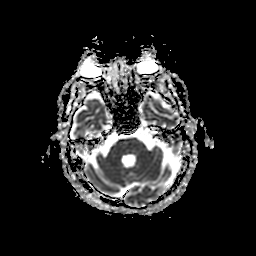
[im 21/49]
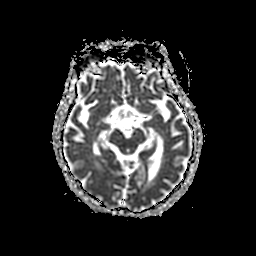
[im 28/49]
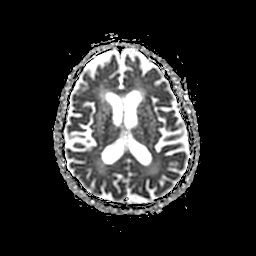
[im 35/49]
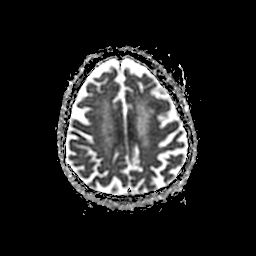
[im 42/49]
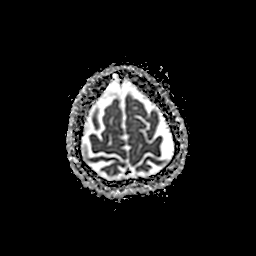
[im 49/49]
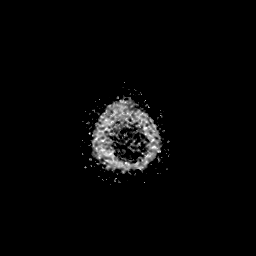

[Series 500: DWI · coronal · 5.0mm · 1.09mm/px · 6 of 35 slices shown (4 of 4)]
[im 1/35]
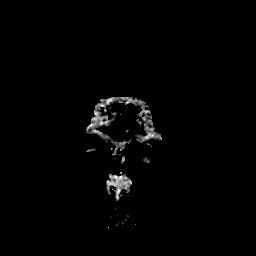
[im 7/35]
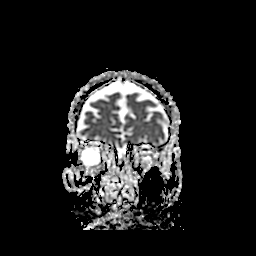
[im 14/35]
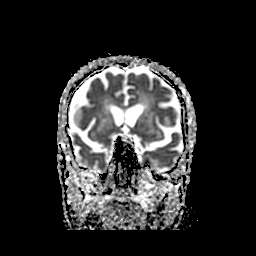
[im 21/35]
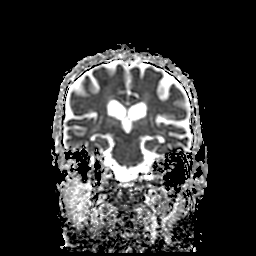
[im 28/35]
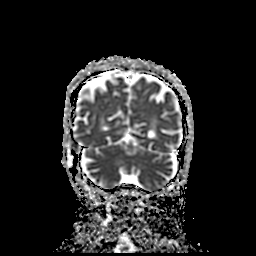
[im 35/35]
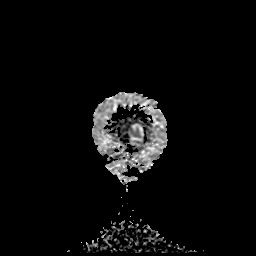

[48 of 48 positions shown; findings below may reference images not displayed]

FINDINGS: The examination had to be discontinued prior to completion due to
patient agitation, inability to continue. Axial and coronal DWI plus
motion limited axial FLAIR imaging only was obtained.

Diffusion imaging demonstrates a confluent 15 mm area of restricted
diffusion at the right superior motor strip corresponding to left
hand representation area (series 3, image 40). Mild if any
associated FLAIR hyperintensity. No mass effect.

Punctate additional focus of restricted diffusion in the right
occipital lobe cortex near the occipital pole (series 3, image 20).
And questionable additional punctate area of cortical restricted
diffusion in the left high superior frontal gyrus (image 43).

No intracranial mass effect. No ventriculomegaly. Chronic patchy and
confluent bilateral cerebral white matter FLAIR hyperintensity is
again evident. Mild FLAIR hyperintensity in the pons.
IMPRESSION: 1. Acute 15 mm Infarct at the right motor strip, left hand
representation area.

2. One and possibly two additional punctate foci of acute ischemia
in the right PCA, left MCA areas suggests embolic infarcts. Query
atrial fibrillation. Alternatively, synchronous small vessel disease
is possible.

3. Truncated exam. No evidence of acute hemorrhage. No intracranial
mass effect.

## 2022-03-27 IMAGING — CT CT ANGIO NECK
1 of 10 series · 13 of 46 positions shown, 18 images · IV contrast (OMNI)
Comparison: Head CT earlier same day. Previous CT angiography
05/29/2013.

CLINICAL DATA: Code stroke presentation. Specific defect not
listed.

EXAM:
CT ANGIOGRAPHY HEAD AND NECK
TECHNIQUE: Multidetector CT imaging of the head and neck was performed using
the standard protocol during bolus administration of intravenous
contrast. Multiplanar CT image reconstructions and MIPs were
obtained to evaluate the vascular anatomy. Carotid stenosis
measurements (when applicable) are obtained utilizing NASCET
criteria, using the distal internal carotid diameter as the
denominator.
CONTRAST:  50mL OMNIPAQUE IOHEXOL 350 MG/ML SOLN

[Series 6: thin · axial · 0.51mm/px · z∈[-327,-18]mm · 13 of 708 slices shown, 18 images]
[im 45/708  soft-tissue]
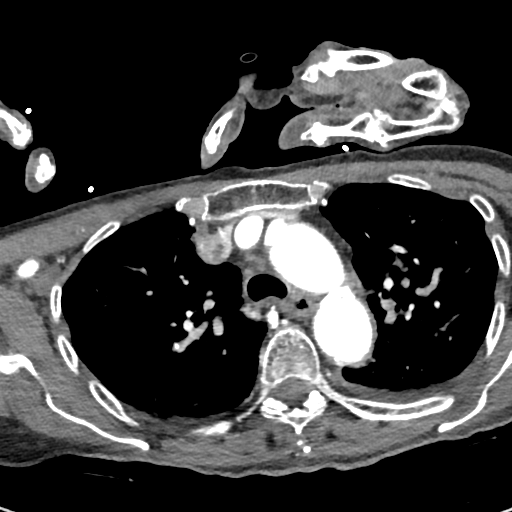
[im 45/708  bone]
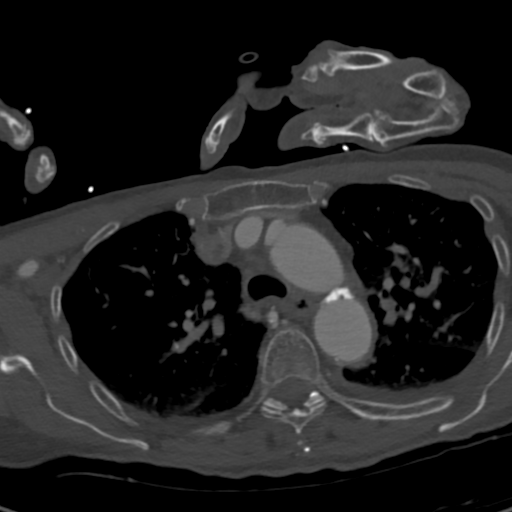
[im 89/708  soft-tissue]
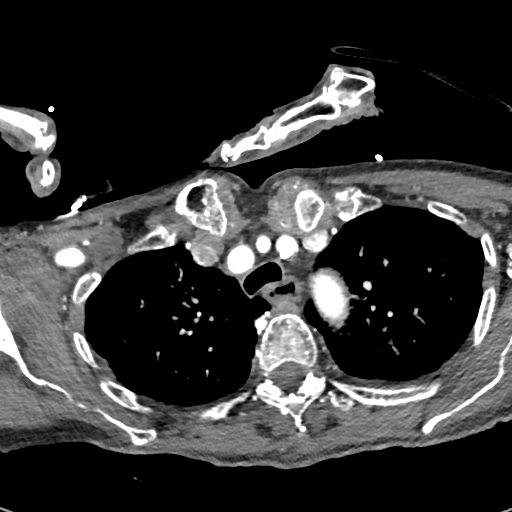
[im 177/708  soft-tissue]
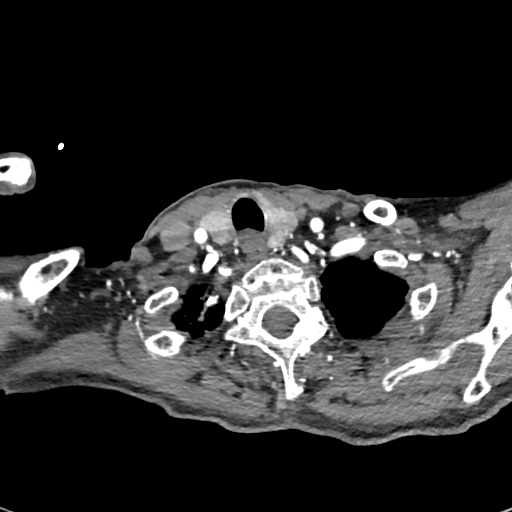
[im 221/708  soft-tissue]
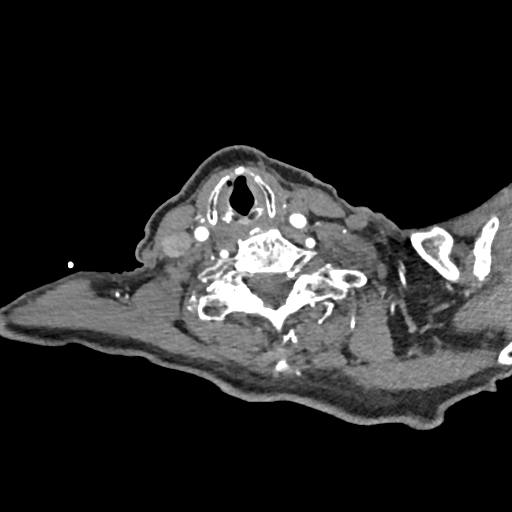
[im 266/708  soft-tissue]
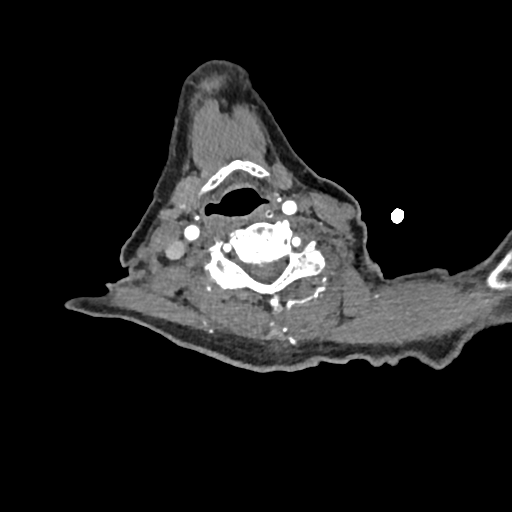
[im 310/708  soft-tissue]
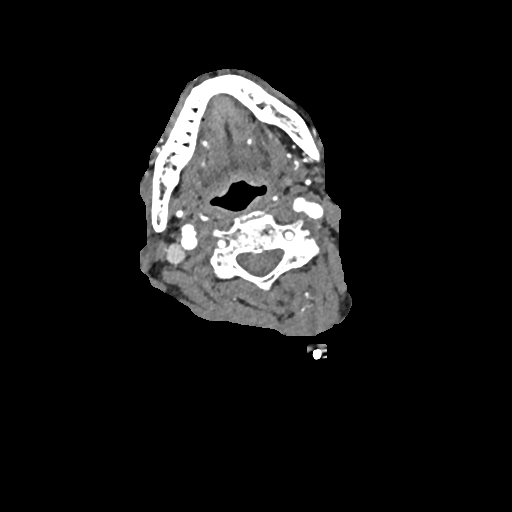
[im 398/708  soft-tissue]
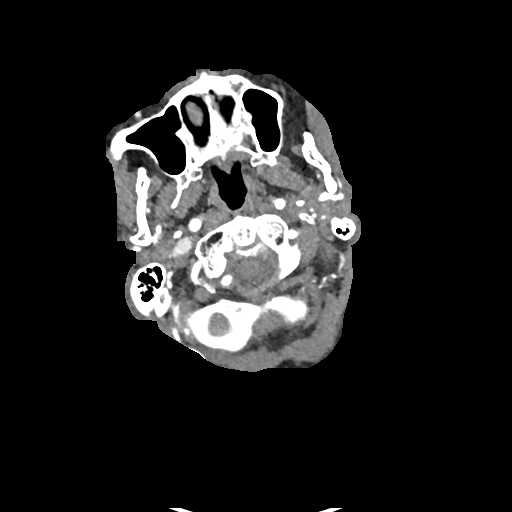
[im 442/708  soft-tissue]
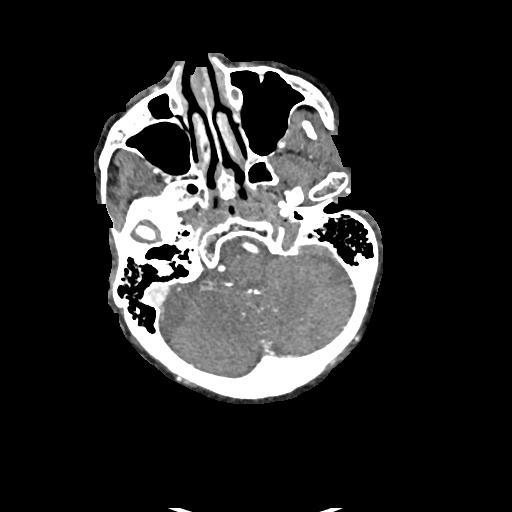
[im 487/708  soft-tissue]
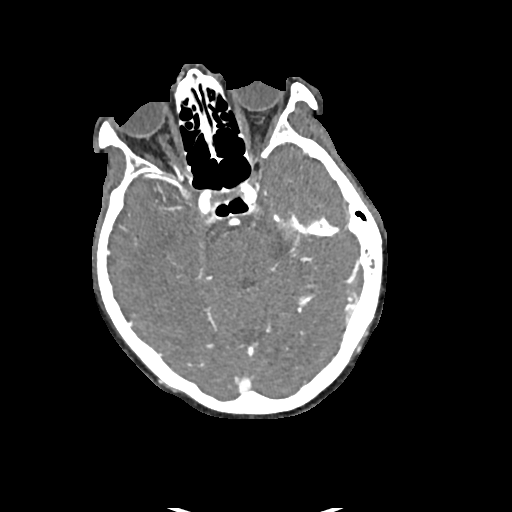
[im 487/708  bone]
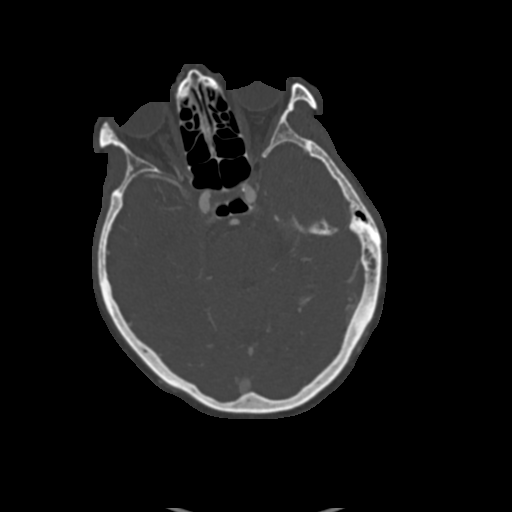
[im 531/708  soft-tissue]
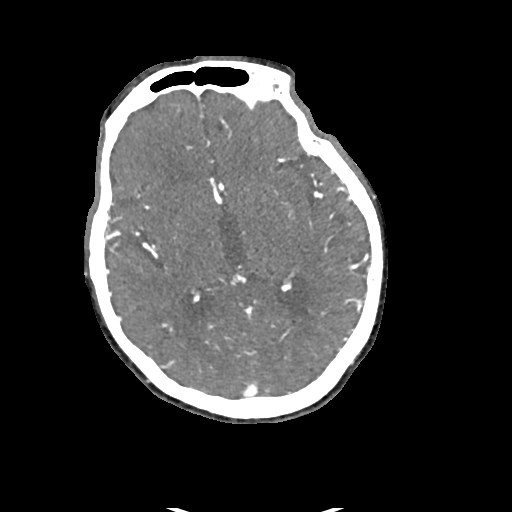
[im 531/708  lung]
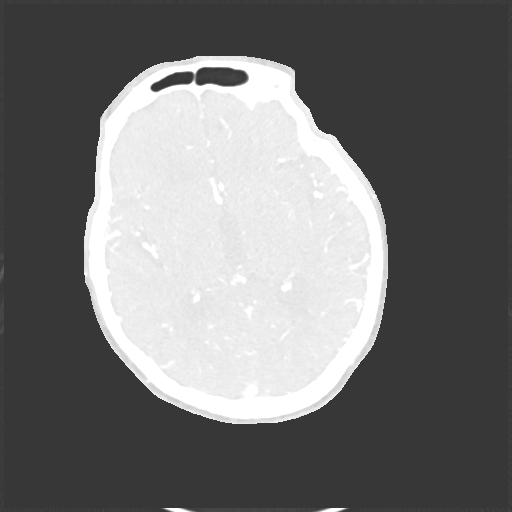
[im 575/708  lung]
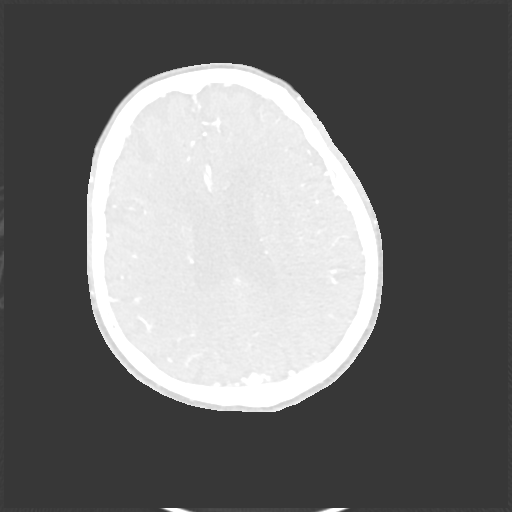
[im 619/708  soft-tissue]
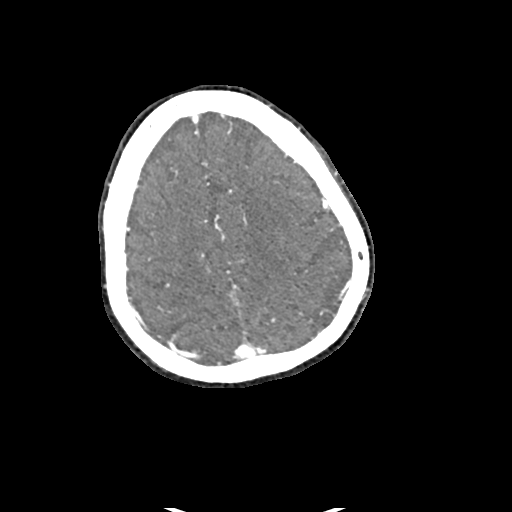
[im 619/708  lung]
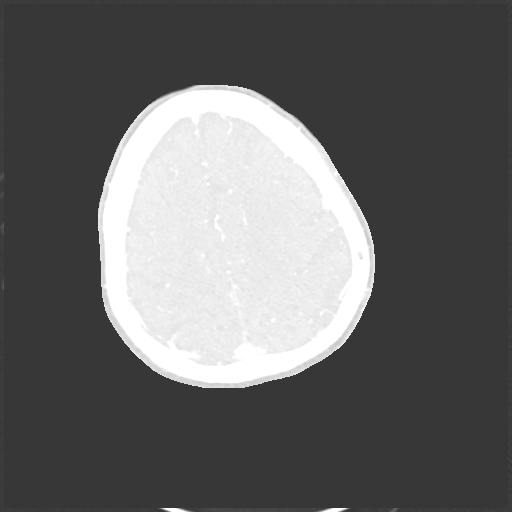
[im 663/708  soft-tissue]
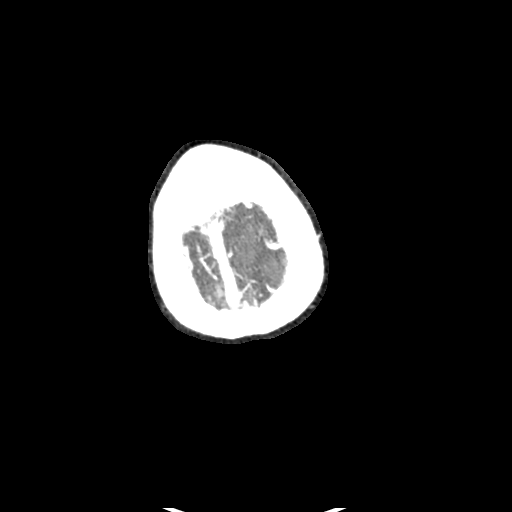
[im 663/708  lung]
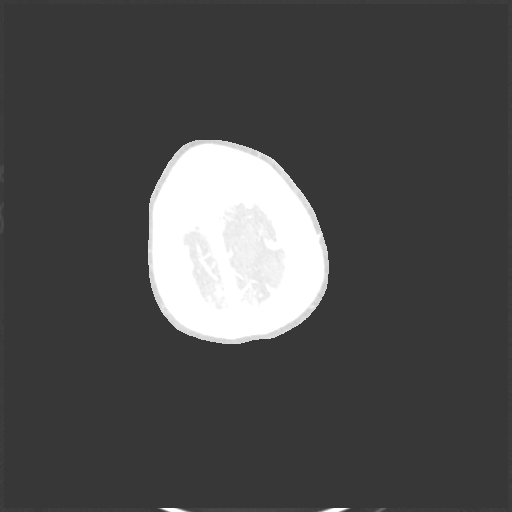

[13 of 46 positions shown; findings below may reference images not displayed]

FINDINGS: CTA NECK FINDINGS

Aortic arch: Aortic atherosclerosis. No aneurysm or dissection.
Branching pattern is normal without origin stenosis.

Right carotid system: Common carotid artery widely patent to the
bifurcation. Mild atherosclerotic plaque at the carotid bifurcation
but no stenosis. Cervical ICA widely patent.

Left carotid system: Common carotid artery widely patent to the
bifurcation. Calcified plaque at the carotid bifurcation without
stenosis. Cervical ICA widely patent.

Vertebral arteries: Both vertebral artery origins are widely patent.
Both vertebral arteries widely patent through the cervical region to
the foramen magnum.

Skeleton: Ordinary cervical spondylosis.

Other neck: No mass or lymphadenopathy.

Upper chest: Scarring and interstitial prominence at the lung
apices. Small effusions layering dependently.

Review of the MIP images confirms the above findings

CTA HEAD FINDINGS

Anterior circulation: Both internal carotid arteries are widely
patent through the skull base and carotid siphon regions. Ordinary
siphon atherosclerotic calcification but no stenosis greater than
30%. The anterior and middle cerebral vessels are patent. No large
or medium vessel occlusion is visible. No correctable proximal
stenosis.

Posterior circulation: Both vertebral arteries widely patent through
the foramen magnum. Mild calcified plaque at both vertebral artery
V4 segments but without stenosis greater than 30%. Both vertebral
arteries reach the basilar. No basilar stenosis. Superior cerebellar
and posterior cerebral arteries are patent.

Venous sinuses: Patent and normal.

Anatomic variants: None significant.

Review of the MIP images confirms the above findings
IMPRESSION: 1. No large or medium vessel occlusion.
2. Mild atherosclerotic disease at both carotid bifurcations but
without stenosis.
3. These results were communicated to [place] at [DATE] pmon 03/23/2020by
text page via the [employer] messaging system.

Aortic Atherosclerosis ([id number]).

## 2022-04-01 ENCOUNTER — Other Ambulatory Visit: Payer: Self-pay | Admitting: Family Medicine

## 2022-04-10 ENCOUNTER — Other Ambulatory Visit: Payer: Self-pay | Admitting: Family Medicine

## 2022-04-10 NOTE — Telephone Encounter (Signed)
Pt

## 2022-04-10 NOTE — Telephone Encounter (Signed)
Pts son called stating that his mom is out of Amlodipine completely and is running low on Irbesartan. She is scheduled for the 9th, however she will need a few pills until her appt on Nov. 9th.

## 2022-04-13 ENCOUNTER — Encounter: Payer: Self-pay | Admitting: Family Medicine

## 2022-04-13 ENCOUNTER — Ambulatory Visit (INDEPENDENT_AMBULATORY_CARE_PROVIDER_SITE_OTHER): Payer: Medicare HMO | Admitting: Family Medicine

## 2022-04-13 VITALS — BP 124/63 | HR 62 | Temp 98.0°F | Ht 64.0 in | Wt 124.8 lb

## 2022-04-13 DIAGNOSIS — I10 Essential (primary) hypertension: Secondary | ICD-10-CM | POA: Diagnosis not present

## 2022-04-13 DIAGNOSIS — Z862 Personal history of diseases of the blood and blood-forming organs and certain disorders involving the immune mechanism: Secondary | ICD-10-CM

## 2022-04-13 DIAGNOSIS — N390 Urinary tract infection, site not specified: Secondary | ICD-10-CM | POA: Diagnosis not present

## 2022-04-13 DIAGNOSIS — Z8679 Personal history of other diseases of the circulatory system: Secondary | ICD-10-CM | POA: Diagnosis not present

## 2022-04-13 DIAGNOSIS — N1831 Chronic kidney disease, stage 3a: Secondary | ICD-10-CM | POA: Diagnosis not present

## 2022-04-13 DIAGNOSIS — E7800 Pure hypercholesterolemia, unspecified: Secondary | ICD-10-CM

## 2022-04-13 LAB — COMPREHENSIVE METABOLIC PANEL
ALT: 11 U/L (ref 0–35)
AST: 17 U/L (ref 0–37)
Albumin: 3.9 g/dL (ref 3.5–5.2)
Alkaline Phosphatase: 52 U/L (ref 39–117)
BUN: 35 mg/dL — ABNORMAL HIGH (ref 6–23)
CO2: 29 mEq/L (ref 19–32)
Calcium: 8.3 mg/dL — ABNORMAL LOW (ref 8.4–10.5)
Chloride: 103 mEq/L (ref 96–112)
Creatinine, Ser: 1.04 mg/dL (ref 0.40–1.20)
GFR: 46.63 mL/min — ABNORMAL LOW (ref >60.00)
Glucose, Bld: 108 mg/dL — ABNORMAL HIGH (ref 70–99)
Potassium: 4 mEq/L (ref 3.5–5.1)
Sodium: 137 mEq/L (ref 135–145)
Total Bilirubin: 0.5 mg/dL (ref 0.2–1.2)
Total Protein: 6.1 g/dL (ref 6.0–8.3)

## 2022-04-13 LAB — LIPID PANEL
Cholesterol: 126 mg/dL (ref 0–200)
HDL: 49.2 mg/dL (ref >39.00)
LDL Cholesterol: 64 mg/dL (ref 0–99)
NonHDL: 76.53
Total CHOL/HDL Ratio: 3
Triglycerides: 64 mg/dL (ref 0.0–149.0)
VLDL: 12.8 mg/dL (ref 0.0–40.0)

## 2022-04-13 LAB — POCT URINALYSIS DIPSTICK
Bilirubin, UA: NEGATIVE
Blood, UA: NEGATIVE
Glucose, UA: NEGATIVE
Nitrite, UA: NEGATIVE
Protein, UA: POSITIVE — AB
Spec Grav, UA: 1.02 (ref 1.010–1.025)
Urobilinogen, UA: 0.2 E.U./dL
pH, UA: 6 (ref 5.0–8.0)

## 2022-04-13 LAB — CBC
HCT: 33.2 % — ABNORMAL LOW (ref 36.0–46.0)
Hemoglobin: 11.2 g/dL — ABNORMAL LOW (ref 12.0–15.0)
MCHC: 33.6 g/dL (ref 30.0–36.0)
MCV: 92.4 fl (ref 78.0–100.0)
Platelets: 133 10*3/uL — ABNORMAL LOW (ref 150.0–400.0)
RBC: 3.59 Mil/uL — ABNORMAL LOW (ref 3.87–5.11)
RDW: 14 % (ref 11.5–15.5)
WBC: 4.9 10*3/uL (ref 4.0–10.5)

## 2022-04-13 MED ORDER — AMLODIPINE BESYLATE 10 MG PO TABS: 10.0000 mg | ORAL_TABLET | Freq: Every day | ORAL | 3 refills | Status: DC

## 2022-04-13 MED ORDER — IRBESARTAN 150 MG PO TABS: 150.0000 mg | ORAL_TABLET | Freq: Every day | ORAL | 3 refills | Status: DC

## 2022-04-13 MED ORDER — CEVIMELINE HCL 30 MG PO CAPS: 30.0000 mg | ORAL_CAPSULE | Freq: Two times a day (BID) | ORAL | 3 refills | Status: DC

## 2022-04-13 NOTE — Progress Notes (Signed)
OFFICE VISIT  04/13/2022  CC:  Chief Complaint  Patient presents with   Hypertension   Chronic Kidney Disease    Patient is a 88 y.o. female who presents accompanied by her son Merry Proud for follow-up hypertension, chronic renal insufficiency stage III, A-flutter/fib, and hx of IDA. A/P as of last visit 11 months ago: "1) HTN: stable with a reading of 114/56. Continue amlod 10 qd, clonidine 0.1 patch 1 q 7d, and irbesartan 150 qd. BMP today   2) A flutter/fib; successful ablation procedure done 06/03/20. Stable with a pulse at 55 on 100 mg of amiodarone. Patient has a normal sinus rhythm today with no mention of heart flutter or palpitations. Cardiologist is considering ween patient off amiodarone slowly. Continue amiodarone and baby aspirin 81 mg.    3) Hx of IDA from occult GI bleed: no signs of significant blood loss (dizziness, fatigue). Has seen GI, elected for no interventional/procedureal eval. Monitoring sx's and blood counts/iron. Continue oral iron supplement.   4) Dementia: Stable, doing well with 24/7 home monitoring/assistance. No behavioral disturbance.   5) CRI III:  (01/14/21- elevated 115 glucose, elevated BUN 34, Cr 1.23, GFR 38) but patient remains asymptomatic (no edema, sob). Repeat BMP today. "  INTERIM HX: She feels like she is doing well. She says she has had intermittent feeling of some discomfort with urination or shortly thereafter.  States it may happen every couple to 3 days.  Her level of urinary urge incontinence and frequency is stable.  She does take Cipro 250 mg prophylactically for recurrent UTI.  No fever, abdominal pain, or nausea.  Denies palpitations. Blood pressures have been normal.  Of note she has not been taking the clonidine patch in quite some time.  Short-term memory problems continue to gradually progress.  She gets 24/7 care/supervision. She has had a few falls in the last 6 months, none of which have resulted in any injury.  Usually these  occur when she is trying to do something that requires too much balance and dexterity.  ROS as above, plus-->  no CP, no SOB, no wheezing, no cough, no dizziness, no HAs, no rashes, no melena/hematochezia.  No polyuria or polydipsia.  No myalgias or arthralgias.  No focal weakness, paresthesias, or tremors.  No acute vision or hearing abnormalities.No recent changes in lower legs. No palpitations.    Past Medical History:  Diagnosis Date   Arthritis    "think it's osteo; got some in my hands"   Atrial fibrillation ([place]) 02/10/2020   Dr. Roland Rack, [employer] [facility]-[place],ASA and BB (stopped her ARB)-in sinus-monitor planned but pt hosp for acute R CVA, eliquis started and pt remained on rate control   Bifascicular block    CAD (coronary artery disease)    Cath 2009  Occluded LAD, 40-50% RCA and circ managed medically.  Repeat 06/22/15 no change.   Chronic constipation    slow transit.  Barium enema to eval for colonic stricture 10/2014 was NORMAL   COPD (chronic obstructive pulmonary disease) (HCC)    Changes noted on CXR 2017   CVA (cerebral vascular accident) ([place]) 03/2020   L sided weakness; a-fib-->eliquis + rate control   Dementia (HCC)    GERD (gastroesophageal reflux disease)    (also LPR) Schatzki's ring, s/p dil '97; food impaction 08/2010, on PPI therapy since w/out further sx so no dil performed   History of herpes zoster 06/2015   right side of chest; presented with chest pain mimicking [place]--cardiac eval showed stable CAD  compared to 2009.   Hyperkalemia 06/2017   suspected due to increase of losartan from '25mg'$  bid to '50mg'$  bid.  Dose lowered back to 25 mg bid K normalized.  After increase again to 50 qAM and 25 qPM potassium 4.8 so I did not increase dose any further (07/20/17).   Hyperlipidemia    statin started in hosp for cva 03/2020   Hypertension    permissive elevated bp ok   Hyponatremia 10/03/2011   Na 131 on labs 02/2015 at [facility].  Baseline Na 132.   Iron deficiency  anemia 11/30/15; 09/2019   Occult GI bleed: 09/2019 Hb drop to 8.5, iron low, hemoccult +.  Oral iron started->Dr. Buccini eval->heme neg in his office->obs/follow Hb on oral Fe; order BE if Hb not responding or having ongoing heme+ stool. Then hosp for a-fib + CVA, eliquis started b/c no overt bleeding noted.   Mixed stress and urge urinary incontinence    RBBB with left anterior fascicular block    bifascicular block   Recurrent UTI    Bactrim prophyl, then changed to cefdinir (?med rxn?).  Cipro qd as of 08/2018.   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  ANA and sjogren's panel NEG 06/2017.    Past Surgical History:  Procedure Laterality Date   ABDOMINAL HYSTERECTOMY  2000   APPENDECTOMY  2000   ATRIAL FLUTTER ABLATION  06/03/2020   CARDIAC CATHETERIZATION N/A 06/22/2015   Stable single vessel CAD, EF normal.  Procedure: Left Heart Cath and Coronary Angiography;  Surgeon: Leonie Man, MD;  Location: [place] [facility];  Service: Cardiovascular;  Laterality: N/A;   CATARACT EXTRACTION W/ INTRAOCULAR LENS  IMPLANT, BILATERAL  1990's   CESAREAN SECTION  1959; 1960   COLONOSCOPY  04/2001   BE neg 10/2014   DILATION AND CURETTAGE OF UTERUS     ESOPHAGOGASTRODUODENOSCOPY  1997   for dysphagia->esoph dilatation done   HERNIA REPAIR     abdominal "twice"   LEFT HEART CATHETERIZATION WITH CORONARY ANGIOGRAM N/A 10/04/2011   Procedure: LEFT HEART CATHETERIZATION WITH CORONARY ANGIOGRAM;  Surgeon: Jacolyn Reedy, MD;  Location: [facility] CATH LAB;  Service: Cardiovascular;  Laterality: N/A;   REPAIR KNEE LIGAMENT  ~ 2008   left   Rhythm monitoring  11/02/2020   48H holter->NSR w/1st deg AV block, occ PADs, rare PVCs, frequent  sinus brady, lowest HR 47, no pauses.   TONSILLECTOMY     as a child   TRANSTHORACIC ECHOCARDIOGRAM  05/2013; 02/2020; 03/24/20   02/2020 (new dx a-fib) EF 60-65%, valves ok, no wall motion abnorm. 03/2020, pt in a-fib/flutter EF 55-60%, valves ok, small peric effus.     Outpatient Medications Prior to Visit  Medication Sig Dispense Refill   amiodarone (PACERONE) 200 MG tablet Take 200 mg by mouth daily. Take 1/2 pill daily by cardiologist     aspirin EC 81 MG tablet Take 81 mg by mouth daily. Swallow whole.     atorvastatin (LIPITOR) 40 MG tablet Take 1 tablet (40 mg total) by mouth daily. 30 tablet 0   Cholecalciferol (VITAMIN D3) 1000 UNITS CAPS Take 1,000 Units by mouth daily.     ciprofloxacin (CIPRO) 250 MG  tablet TAKE 1 TABLET BY MOUTH EVERY DAY FOR BLADDER INFECTION PROPHYLAXIS. 90 tablet 1   FERROUS SULFATE PO Take 1 tablet by mouth daily.      Omega-3 Fatty Acids (FISH OIL) 1000 MG CAPS Take 1 capsule by mouth daily.     oxybutynin (DITROPAN) 5 MG tablet TAKE 1 TABLET BY MOUTH EVERY OTHER DAY 45 tablet 1   pantoprazole (PROTONIX) 40 MG tablet Take 1 tablet (40 mg total) by mouth daily. 30 tablet 5   polyethylene glycol powder (GLYCOLAX/MIRALAX) powder TAKE 17 G BY MOUTH 2 (TWO) TIMES DAILY. (Patient taking differently: Take 17 g by mouth as needed.) 527 g 5   cevimeline (EVOXAC) 30 MG capsule TAKE 1 CAPSULE 3 TIMES A DAY FOR TREATMENT OF CHRONIC DRY MOUTH (Patient taking differently: Take 30 mg by mouth in the morning and at bedtime. TAKE 1 CAPSULE 3 TIMES A DAY FOR TREATMENT OF CHRONIC DRY MOUTH) 270 capsule 3   fluticasone (FLONASE) 50 MCG/ACT nasal spray SPRAY 2 SPRAYS INTO EACH NOSTRIL EVERY DAY (Patient not taking: Reported on 04/13/2022) 48 mL 0   nitroGLYCERIN (NITROSTAT) 0.4 MG SL tablet Place 1 tablet (0.4 mg total) under the tongue every 5 (five) minutes as needed for chest pain. (Patient not taking: Reported on 04/13/2022) 25 tablet 12   amLODipine (NORVASC) 10 MG tablet TAKE 1  TABLET BY MOUTH EVERY DAY 30 tablet 0   cloNIDine (CATAPRES - DOSED IN MG/24 HR) 0.1 mg/24hr patch Place 1 patch (0.1 mg total) onto the skin once a week. (Patient not taking: Reported on 04/13/2022) 12 patch 3   irbesartan (AVAPRO) 150 MG tablet TAKE 1 TABLET BY MOUTH EVERY DAY 30 tablet 0   No facility-administered medications prior to visit.    Allergies  Allergen Reactions   Cefdinir Hives   Macrobid [Nitrofurantoin Macrocrystal] Other (See Comments)    "I had chills & fever"    ROS As per HPI  PE:    04/13/2022    1:16 PM 09/19/2021    1:17 PM 06/22/2021    2:43 PM  Vitals with BMI  Height '5\' 4"'$  '5\' 4"'$    Weight 124 lbs 13 oz 132 lbs 10 oz 132 lbs 2 oz  BMI 25.05 39.76   Systolic 734 193 790  Diastolic 63 62 68  Pulse 62 67 60   Physical Exam  Gen: Alert, well appearing.  Patient is oriented to person, place, time, and situation. CV: Regular rhythm and rate, soft systolic murmur, intermittent split S2.  No diastolic murmur. Lungs clear to auscultation bilaterally. Extremities show no edema.  LABS:  Last CBC Lab Results  Component Value Date   WBC 6.6 05/09/2021   HGB 11.6 (L) 05/09/2021   HCT 34.9 (L) 05/09/2021   MCV 92.2 05/09/2021   MCH 30.7 01/05/2021   RDW 13.7 05/09/2021   PLT 149.0 (L) 05/09/2021   Lab Results  Component Value Date   IRON 66 05/09/2021   TIBC 238 (L) 05/09/2021   FERRITIN 91 24/02/7352   Last metabolic panel Lab Results  Component Value Date   GLUCOSE 110 (H) 05/09/2021   NA 136 05/09/2021   K 4.4 05/09/2021   CL 102 05/09/2021   CO2 27 05/09/2021   BUN 30 (H) 05/09/2021   CREATININE 1.29 (H) 05/09/2021   GFRNONAA >60 03/26/2020   CALCIUM 8.7 05/09/2021   PROT 6.4 05/09/2021   ALBUMIN 4.1 05/09/2021   BILITOT 0.6 05/09/2021   ALKPHOS 55 05/09/2021   AST 18  05/09/2021   ALT 13 05/09/2021   ANIONGAP 12 03/26/2020   Last lipids Lab Results  Component Value Date   CHOL 146 05/09/2021   HDL 58.80 05/09/2021   LDLCALC  72 05/09/2021   TRIG 76.0 05/09/2021   CHOLHDL 2 05/09/2021   Last hemoglobin A1c Lab Results  Component Value Date   HGBA1C 5.6 03/24/2020   IMPRESSION AND PLAN:  #1 recurrent UTI. I think her recent symptoms are more due to concentrated urine rather than infection. Trace LE on dipstick UA here today.  We will send for culture.  No new antibiotics at this time.  Continue Cipro 250 daily for prophylaxis.  #2 hypertension, well controlled on amlodipine 10 mg daily and irbesartan 150 mg daily. Will take clonidine patch off of her med list. Electrolytes and creatinine today.  3.  Chronic renal insufficiency stage III. She avoids NSAIDs.  Encouraged better hydration. Electrolytes and creatinine today.  #4 history of A-fib/flutter. She is doing well status post ablation in December 2021. Her cardiologist has continued her on 100 mg of amiodarone daily.  #5 Hx of IDA from occult GI bleed 2021: Has seen GI, elected for no interventional/procedureal eval.  Continue oral iron supplement. CBC and iron panel today.  #6 hyperlipidemia. Doing well on atorvastatin 40 mg a day. Not fasting today. Hepatic panel today.  An After Visit Summary was printed and given to the patient.  FOLLOW UP: Return in about 6 months (around 10/12/2022) for routine chronic illness f/u.  Signed:  Crissie Sickles, MD           04/13/2022

## 2022-04-14 LAB — IRON,TIBC AND FERRITIN PANEL
%SAT: 32 % (calc) (ref 16–45)
Ferritin: 124 ng/mL (ref 16–288)
Iron: 63 ug/dL (ref 45–160)
TIBC: 200 mcg/dL (calc) — ABNORMAL LOW (ref 250–450)

## 2022-04-16 LAB — URINE CULTURE
MICRO NUMBER:: 14167620
SPECIMEN QUALITY:: ADEQUATE

## 2022-04-17 ENCOUNTER — Telehealth: Payer: Self-pay

## 2022-04-17 MED ORDER — SULFAMETHOXAZOLE-TRIMETHOPRIM 800-160 MG PO TABS: 1.0000 | ORAL_TABLET | Freq: Two times a day (BID) | ORAL | 0 refills | Status: DC

## 2022-04-17 NOTE — Telephone Encounter (Signed)
-----   Message from Tammi Sou, MD sent at 04/17/2022 12:04 PM EST ----- All blood work stable. Her urine grew bacteria so I want her to take bactrim. Pls do rx for bactrim DS, 1 bid x 5d, #10, no RF. Stop ciprofloxacin while taking bactrim.  Restart cipro when finished with bactrim.

## 2022-04-19 ENCOUNTER — Telehealth: Payer: Self-pay | Admitting: *Deleted

## 2022-04-19 NOTE — Patient Outreach (Signed)
  Care Coordination   04/19/2022 Name: Sabrina Alvarez MRN: [id number] DOB: 1927-08-30   Care Coordination Outreach Attempts:  An unsuccessful telephone outreach was attempted today to offer the patient information about available care coordination services as a benefit of their health plan.   Follow Up Plan:  Additional outreach attempts will be made to offer the patient care coordination information and services.   Encounter Outcome:  No Answer  Care Coordination Interventions Activated:  No   Care Coordination Interventions:  No, not indicated    Raina Mina, RN Care Management Coordinator [place] Office [phone]

## 2022-06-28 ENCOUNTER — Ambulatory Visit (INDEPENDENT_AMBULATORY_CARE_PROVIDER_SITE_OTHER): Payer: Medicare HMO

## 2022-06-28 ENCOUNTER — Other Ambulatory Visit: Payer: Self-pay | Admitting: Family Medicine

## 2022-06-28 DIAGNOSIS — K219 Gastro-esophageal reflux disease without esophagitis: Secondary | ICD-10-CM | POA: Insufficient documentation

## 2022-06-28 DIAGNOSIS — R195 Other fecal abnormalities: Secondary | ICD-10-CM

## 2022-06-28 DIAGNOSIS — E7800 Pure hypercholesterolemia, unspecified: Secondary | ICD-10-CM | POA: Insufficient documentation

## 2022-06-28 DIAGNOSIS — Z0000 Encounter for general adult medical examination without abnormal findings: Secondary | ICD-10-CM

## 2022-06-28 DIAGNOSIS — N939 Abnormal uterine and vaginal bleeding, unspecified: Secondary | ICD-10-CM | POA: Insufficient documentation

## 2022-06-28 DIAGNOSIS — I10 Essential (primary) hypertension: Secondary | ICD-10-CM | POA: Insufficient documentation

## 2022-06-28 DIAGNOSIS — N3946 Mixed incontinence: Secondary | ICD-10-CM | POA: Insufficient documentation

## 2022-06-28 DIAGNOSIS — H9190 Unspecified hearing loss, unspecified ear: Secondary | ICD-10-CM | POA: Insufficient documentation

## 2022-06-28 DIAGNOSIS — R000 Tachycardia, unspecified: Secondary | ICD-10-CM | POA: Insufficient documentation

## 2022-06-28 NOTE — Progress Notes (Signed)
Subjective:   Sabrina Alvarez is a 89 y.o. female who presents for Medicare Annual (Subsequent) preventive examination.  I connected with  Jamison Oka on 06/28/22 by an audio only telemedicine application and verified that I am speaking with the correct person using two identifiers.   I discussed the limitations, risks, security and privacy concerns of performing an evaluation and management service by telephone and the availability of in person appointments. I also discussed with the patient that there may be a patient responsible charge related to this service. The patient expressed understanding and verbally consented to this telephonic visit.  Location of Patient: home Location of Provider:office  List any persons and their role that are participating in the visit with the patient.   [place], CMA  Review of Systems    Defer to PCP        Objective:    There were no vitals filed for this visit. There is no height or weight on file to calculate BMI.     06/28/2022    1:47 PM 06/22/2021    2:56 PM 01/10/2021    6:13 PM 03/23/2020   10:18 PM 04/19/2018    2:19 PM 06/29/2017    2:45 PM 07/11/2016    6:49 PM  Advanced Directives  Does Patient Have a Medical Advance Directive? Yes Yes No Yes Yes Yes Yes  Type of Paramedic of [place];Living will Healthcare Power of [place]  Living will [place];Living will [place];Living will Living will  Does patient want to make changes to medical advance directive?    No - Patient declined     Copy of [place] in Chart? Yes - validated most recent copy scanned in chart (See row information) Yes - validated most recent copy scanned in chart (See row information)    No - copy requested   Would patient like information on creating a medical advance directive?    No - Patient declined       Current Medications (verified) Outpatient  Encounter Medications as of 06/28/2022  Medication Sig   amiodarone (PACERONE) 200 MG tablet Take 200 mg by mouth daily. Take 1/2 pill daily by cardiologist   amLODipine (NORVASC) 10 MG tablet Take 1 tablet (10 mg total) by mouth daily.   aspirin EC 81 MG tablet Take 81 mg by mouth daily. Swallow whole.   atorvastatin (LIPITOR) 40 MG tablet Take 1 tablet (40 mg total) by mouth daily.   cevimeline (EVOXAC) 30 MG capsule Take 1 capsule (30 mg total) by mouth in the morning and at bedtime.   Cholecalciferol (VITAMIN D3) 1000 UNITS CAPS Take 1,000 Units by mouth daily.   ciprofloxacin (CIPRO) 250 MG tablet TAKE 1 TABLET BY MOUTH EVERY DAY FOR BLADDER INFECTION PROPHYLAXIS.   FERROUS SULFATE PO Take 1 tablet by mouth daily.    fluticasone (FLONASE) 50 MCG/ACT nasal spray SPRAY 2 SPRAYS INTO EACH NOSTRIL EVERY DAY (Patient not taking: Reported on 04/13/2022)   irbesartan (AVAPRO) 150 MG tablet Take 1 tablet (150 mg total) by mouth daily.   nitroGLYCERIN (NITROSTAT) 0.4 MG SL tablet Place 1 tablet (0.4 mg total) under the tongue every 5 (five) minutes as needed for chest pain. (Patient not taking: Reported on 04/13/2022)   Omega-3 Fatty Acids (FISH OIL) 1000 MG CAPS Take 1 capsule by mouth daily.   oxybutynin (DITROPAN) 5 MG tablet TAKE 1 TABLET BY MOUTH EVERY OTHER DAY  pantoprazole (PROTONIX) 40 MG tablet TAKE 1 TABLET BY MOUTH EVERY DAY   polyethylene glycol powder (GLYCOLAX/MIRALAX) powder TAKE 17 G BY MOUTH 2 (TWO) TIMES DAILY. (Patient taking differently: Take 17 g by mouth as needed.)   sulfamethoxazole-trimethoprim (BACTRIM DS) 800-160 MG tablet Take 1 tablet by mouth 2 (two) times daily.   No facility-administered encounter medications on file as of 06/28/2022.    Allergies (verified) Cefdinir and Macrobid [nitrofurantoin macrocrystal]   History: Past Medical History:  Diagnosis Date   Arthritis    "think it's osteo; got some in my hands"   Atrial fibrillation ([place]) 02/10/2020   Dr.  Roland Rack, [employer] cards-[place],ASA and BB (stopped her ARB)-in sinus-monitor planned but pt hosp for acute R CVA, eliquis started and pt remained on rate control   Bifascicular block    CAD (coronary artery disease)    Cath 2009  Occluded LAD, 40-50% RCA and circ managed medically.  Repeat 06/22/15 no change.   Chronic constipation    slow transit.  Barium enema to eval for colonic stricture 10/2014 was NORMAL   COPD (chronic obstructive pulmonary disease) (HCC)    Changes noted on CXR 2017   CVA (cerebral vascular accident) ([place]) 03/2020   L sided weakness; a-fib-->eliquis + rate control   Dementia (HCC)    GERD (gastroesophageal reflux disease)    (also LPR) Schatzki's ring, s/p dil '97; food impaction 08/2010, on PPI therapy since w/out further sx so no dil performed   History of herpes zoster 06/2015   right side of chest; presented with chest pain mimicking [place]--cardiac eval showed stable CAD compared to 2009.   Hyperkalemia 06/2017   suspected due to increase of losartan from '25mg'$  bid to '50mg'$  bid.  Dose lowered back to 25 mg bid K normalized.  After increase again to 50 qAM and 25 qPM potassium 4.8 so I did not increase dose any further (07/20/17).   Hyperlipidemia    statin started in hosp for cva 03/2020   Hypertension    permissive elevated bp ok   Hyponatremia 10/03/2011   Na 131 on labs 02/2015 at [facility].  Baseline Na 132.   Iron deficiency anemia 11/30/15; 09/2019   Occult GI bleed: 09/2019 Hb drop to 8.5, iron low, hemoccult +.  Oral iron started->Dr. Buccini eval->heme neg in his office->obs/follow Hb on oral Fe; order BE if Hb not responding or having ongoing heme+ stool. Then hosp for a-fib + CVA, eliquis started b/c no overt bleeding noted.   Mixed stress and urge urinary incontinence    RBBB with left anterior fascicular block    bifascicular block   Recurrent UTI    Bactrim prophyl, then changed to cefdinir (?med rxn?).  Cipro qd as of 08/2018.   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  ANA and sjogren's panel NEG 06/2017.   Past Surgical History:  Procedure Laterality Date   ABDOMINAL HYSTERECTOMY  2000   APPENDECTOMY  2000   ATRIAL FLUTTER ABLATION  06/03/2020   CARDIAC CATHETERIZATION N/A 06/22/2015   Stable single vessel CAD, EF normal.  Procedure: Left Heart Cath and Coronary Angiography;  Surgeon: Leonie Man, MD;  Location: [facility];  Service: Cardiovascular;  Laterality: N/A;   CATARACT EXTRACTION W/ INTRAOCULAR LENS  IMPLANT, BILATERAL  1990's   CESAREAN SECTION  1959; 1960   COLONOSCOPY  04/2001   BE neg 10/2014   DILATION AND CURETTAGE OF UTERUS     ESOPHAGOGASTRODUODENOSCOPY  1997   for dysphagia->esoph dilatation done   HERNIA REPAIR     abdominal "twice"   LEFT HEART CATHETERIZATION WITH CORONARY ANGIOGRAM N/A 10/04/2011   Procedure: LEFT HEART CATHETERIZATION WITH CORONARY ANGIOGRAM;  Surgeon: Jacolyn Reedy, MD;  Location: [facility] CATH [facility];  Service: Cardiovascular;  Laterality: N/A;   REPAIR KNEE LIGAMENT  ~ 2008   left   Rhythm monitoring  11/02/2020   48H holter->NSR w/1st deg AV block, occ PADs, rare PVCs, frequent sinus brady, lowest HR 47, no pauses.   TONSILLECTOMY     as a child   TRANSTHORACIC ECHOCARDIOGRAM  05/2013; 02/2020; 03/24/20   02/2020 (new dx a-fib) EF 60-65%, valves ok, no wall motion abnorm. 03/2020, pt in a-fib/flutter EF 55-60%, valves ok, small peric effus.    Family History  Problem Relation Age of Onset   Arthritis Mother    AAA (abdominal aortic aneurysm) Mother    Diabetes Sister    CVA Father    Rheum arthritis Sister    Cancer Neg Hx    Heart disease Neg Hx    Thyroid disease Neg Hx    Social History   Socioeconomic  History   Marital status: Widowed    Spouse name: Not on file   Number of children: 3   Years of education: Not on file   Highest education level: Not on file  Occupational History   Not on file  Tobacco Use   Smoking status: Never   Smokeless tobacco: Never  Vaping Use   Vaping Use: Never used  Substance and Sexual Activity   Alcohol use: No   Drug use: No   Sexual activity: Not on file  Other Topics Concern   Not on file  Social History Narrative   Widow, lives alone.  Three sons, one deceased.   Orig from [facility].     Educ: HS.   Occup: retired.  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   No T/A/Ds.   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   Social Determinants of Health   Financial Resource Strain: Low Risk  (06/22/2021)   Overall Financial Resource Strain (CARDIA)    Difficulty of Paying Living Expenses: Not hard at all  Food Insecurity: No Food Insecurity (06/28/2022)   Hunger Vital Sign    Worried About Running Out of Food in the Last Year: Never true    Ran Out of Food in the Last Year: Never true  Transportation Needs: No Transportation Needs (06/22/2021)   PRAPARE - Hydrologist (Medical): No    Lack of Transportation (Non-Medical): No  Physical Activity: Inactive (06/22/2021)   Exercise Vital Sign    Days of Exercise per Week: 0 days    Minutes of Exercise per Session: 0 min  Stress: No Stress Concern Present (06/22/2021)   [place]    Feeling of Stress : Not at all  Social Connections: Moderately Isolated (06/22/2021)   Social Connection and Isolation Panel [NHANES]    Frequency of Communication with Friends and Family: More than three times a week    Frequency of Social Gatherings with Friends and Family: More than three  times a week    Attends Religious Services: More than 4 times per year    Active Member of Clubs or Organizations: No     Attends Archivist Meetings: Never    Marital Status: Widowed    Tobacco Counseling Counseling given: Not Answered   Clinical Intake:  Pre-visit preparation completed: No  Pain : No/denies pain     Nutritional Risks: None Diabetes: No  How often do you need to have someone help you when you read instructions, pamphlets, or other written materials from your doctor or pharmacy?: 1 - Never What is the last grade level you completed in school?: 11th  Diabetic?no         Activities of Daily Living     No data to display           Patient Care Team: Tammi Sou, MD as PCP - General (Family Medicine) Jacolyn Reedy, MD as Consulting Physician (Cardiology) Lomax, Marny Lowenstein, MD (Inactive) as Consulting Physician (Gynecology) Ronald Lobo, MD as Consulting Physician (Gastroenterology) Bjorn Loser, MD as Consulting Physician (Urology) Sydnee Levans, MD as Referring Physician (Dermatology)  Indicate any recent Medical Services you may have received from other than [facility] providers in the past year (date may be approximate).     Assessment:   This is a routine wellness examination for [facility].  Hearing/Vision screen No results found.  Dietary issues and exercise activities discussed:     Goals Addressed   None   Depression Screen    06/22/2021    2:54 PM 05/09/2021    2:02 PM 01/10/2021    5:26 PM 01/30/2020    4:22 PM 10/04/2018    1:49 PM 06/29/2017    2:46 PM 06/06/2017    9:26 AM  PHQ 2/9 Scores  PHQ - 2 Score 0 0 0 0 0 0 0    Fall Risk    06/22/2021    2:58 PM 05/09/2021    2:01 PM 09/06/2020    1:51 PM 01/30/2020    4:22 PM 10/04/2018    1:49 PM  [place] in the past year? 0 0 '1 1 1  '$ Number falls in past yr: 0 0 1 0 1  Injury with Fall? 0 0 0 1 0  Risk for fall due to : Impaired balance/gait;Impaired vision  Impaired balance/gait;Impaired mobility    Follow up Falls prevention discussed Falls evaluation completed   Falls evaluation completed Falls evaluation completed    FALL RISK PREVENTION PERTAINING TO THE HOME:  Any stairs in or around the home? Yes  If so, are there any without handrails? No  Home free of loose throw rugs in walkways, pet beds, electrical cords, etc? Yes  Adequate lighting in your home to reduce risk of falls? Yes   ASSISTIVE DEVICES UTILIZED TO PREVENT FALLS:  Life alert? Yes  Use of a cane, walker or w/c? Yes  Grab bars in the bathroom? Yes  Shower chair or bench in shower? Yes  Elevated toilet seat or a handicapped toilet? Yes   TIMED UP AND GO:  Was the test performed? No .  Length of time to ambulate 10 feet: n/a sec.     Cognitive Function:    06/29/2017    2:47 PM  MMSE - Mini Mental State Exam  Orientation to time 5  Orientation to Place 5  Registration 3  Attention/ Calculation 5  Recall 1  Language- name 2 objects 2  Language- repeat  1  Language- follow 3 step command 3  Language- read & follow direction 1  Write a sentence 1  Copy design 1  Total score 28        06/22/2021    3:03 PM  6CIT Screen  What Year? 4 points  What month? 3 points  What time? 0 points  Count back from 20 0 points  Months in reverse 4 points  Repeat phrase 8 points  Total Score 19 points    Immunizations Immunization History  Administered Date(s) Administered   COVID-19, mRNA, vaccine(Comirnaty)12 years and older 03/11/2022   Fluad Quad(high Dose 65+) 03/06/2019, 03/26/2020, 03/29/2021, 03/11/2022   Influenza Split 04/05/2006, 02/04/2008, 03/30/2009, 03/27/2011   Influenza, High Dose Seasonal PF 02/21/2010, 02/05/2014, 04/15/2015, 02/24/2016, 04/05/2018   Influenza-Unspecified 03/25/2012, 03/27/2013, 04/15/2015, 02/20/2017   PFIZER(Purple Top)SARS-COV-2 Vaccination 07/01/2019, 07/22/2019, 04/09/2020, 01/05/2021   Pneumococcal Conjugate-13 04/15/2015   Pneumococcal Polysaccharide-23 04/15/1999, 03/30/2009, 07/20/2016   Tdap 04/19/2018   Zoster Recombinat  (Shingrix) 07/19/2017   Zoster, Live 04/05/2006    TDAP status: Up to date  Flu Vaccine status: Up to date  Pneumococcal vaccine status: Up to date  Covid-19 vaccine status: Completed vaccines  Qualifies for Shingles Vaccine? Yes   Zostavax completed No   Shingrix Completed?: No.    Education has been provided regarding the importance of this vaccine. Patient has been advised to call insurance company to determine out of pocket expense if they have not yet received this vaccine. Advised may also receive vaccine at local pharmacy or Health Dept. Verbalized acceptance and understanding.  Screening Tests Health Maintenance  Topic Date Due   DEXA SCAN  Never done   Zoster Vaccines- Shingrix (2 of 2) 09/13/2017   COVID-19 Vaccine (6 - 2023-24 season) 05/06/2022   Medicare Annual Wellness (AWV)  06/29/2023   DTaP/Tdap/Td (2 - Td or Tdap) 04/19/2028   Pneumonia Vaccine 6+ Years old  Completed   INFLUENZA VACCINE  Completed   HPV VACCINES  Aged Out    Health Maintenance  Health Maintenance Due  Topic Date Due   DEXA SCAN  Never done   Zoster Vaccines- Shingrix (2 of 2) 09/13/2017   COVID-19 Vaccine (6 - 2023-24 season) 05/06/2022    Colorectal cancer screening: No longer required.   Mammogram status: No longer required due to age.  Bone Density status: n/a  Lung Cancer Screening: (Low Dose CT Chest recommended if Age 59-80 years, 30 pack-year currently smoking OR have quit w/in 15years.) does not qualify.   Lung Cancer Screening Referral: n/a  Additional Screening:  Hepatitis C Screening: does not qualify; Completed n/a  Vision Screening: Recommended annual ophthalmology exams for early detection of glaucoma and other disorders of the eye. Is the patient up to date with their annual eye exam?  Yes  Who is the provider or what is the name of the office in which the patient attends annual eye exams? [facility] If pt is not established with a provider, would they like  to be referred to a provider to establish care? No .   Dental Screening: Recommended annual dental exams for proper oral hygiene  Community Resource Referral / Chronic Care Management: CRR required this visit?  No   CCM required this visit?  No      Plan:     I have personally reviewed and noted the following in the patient's chart:   Medical and social history Use of alcohol, tobacco or illicit drugs  Current medications and supplements  including opioid prescriptions. Patient is not currently taking opioid prescriptions. Functional ability and status Nutritional status Physical activity Advanced directives List of other physicians Hospitalizations, surgeries, and ER visits in previous 12 months Vitals Screenings to include cognitive, depression, and falls Referrals and appointments  In addition, I have reviewed and discussed with patient certain preventive protocols, quality metrics, and best practice recommendations. A written personalized care plan for preventive services as well as general preventive health recommendations were provided to patient.     Beatrix Fetters, [place]   06/28/2022   Nurse Notes: Non-Face to Face or Face to Face 12 minute visit Encounter  Ms. Mcmeekin , Thank you for taking time to come for your Medicare Wellness Visit. I appreciate your ongoing commitment to your health goals. Please review the following plan we discussed and let me know if I can assist you in the future.   These are the goals we discussed:  Goals       Find Help in My Community([facility]) (pt-stated)      Timeframe:  Short-Term Goal Priority:  High Start Date:                          01/10/21   Expected End Date:                05/04/21       Follow Up Date & goal complete 04/12/21    - follow-up on any referrals for help I am given    Why is this important?   Knowing how and where to find help for yourself or family in your neighborhood and community is an important skill.   You will want to take some steps to learn how.    Notes:  04/12/21 confirmed upstream outreached son on 01/26/21 completed referral He confirms that he manages Mrs Kirt's medications and is only having difficulty with synchronizing her medication list with [facility].  RN CM discussed the availability of upstream office pharmacy staff members at [facility] [place] 01/10/21 Dellis Filbert discussed Mrs Miranda is in the medicare gap/"donut hole" for Eliquis and cevimeline He agreed to allow RN CM to complete a [facility] pharmacy referral      Patient Stated      Maintain current health.      Patient Stated      Get back to exercise         This is a list of the screening recommended for you and due dates:  Health Maintenance  Topic Date Due   DEXA scan (bone density measurement)  Never done   Zoster (Shingles) Vaccine (2 of 2) 09/13/2017   COVID-19 Vaccine (6 - 2023-24 season) 05/06/2022   Medicare Annual Wellness Visit  06/29/2023   DTaP/Tdap/Td vaccine (2 - Td or Tdap) 04/19/2028   Pneumonia Vaccine  Completed   Flu Shot  Completed   HPV Vaccine  Aged Out

## 2022-06-28 NOTE — Patient Instructions (Signed)

## 2022-06-29 ENCOUNTER — Other Ambulatory Visit: Payer: Self-pay | Admitting: Family Medicine

## 2022-07-18 ENCOUNTER — Telehealth: Payer: Self-pay | Admitting: Family Medicine

## 2022-07-18 NOTE — Telephone Encounter (Signed)
Patient son, Sabrina Alvarez [facility]) called to request refill on Cipro.  He stated that Dr. Anitra Lauth wanted patient to continue with Cipro but pharmacy denied refill.  He is aware Dr. Anitra Lauth will be back in office tomorrow.  [facility]

## 2022-07-19 ENCOUNTER — Encounter: Payer: Self-pay | Admitting: Family Medicine

## 2022-07-19 ENCOUNTER — Ambulatory Visit (INDEPENDENT_AMBULATORY_CARE_PROVIDER_SITE_OTHER): Payer: Medicare HMO | Admitting: Family Medicine

## 2022-07-19 VITALS — BP 119/58 | HR 57 | Temp 98.0°F | Ht 64.0 in | Wt 127.4 lb

## 2022-07-19 DIAGNOSIS — H6121 Impacted cerumen, right ear: Secondary | ICD-10-CM

## 2022-07-19 MED ORDER — CIPROFLOXACIN HCL 250 MG PO TABS: ORAL_TABLET | ORAL | 1 refills | Status: DC

## 2022-07-19 NOTE — Telephone Encounter (Signed)
Refill sent. Will inform patient when she comes for appt.

## 2022-07-19 NOTE — Progress Notes (Signed)
OFFICE VISIT  07/19/2022  CC:  Chief Complaint  Patient presents with   Earwax   Patient is a 89 y.o. female who presents for concern for ear being full.  HPI: Patient states everything feels fine.  Someone told her recently that her right ear is full of wax.  Not sure if it was an audiologist or not.  She denies any ear pain.  She wears hearing aids. She says she is getting more forgetful. She states she feels well though.   Past Medical History:  Diagnosis Date   Arthritis    "think it's osteo; got some in my hands"   Atrial fibrillation ([place]) 02/10/2020   Dr. Roland Rack, [employer] cards-[place],ASA and BB (stopped her ARB)-in sinus-monitor planned but pt hosp for acute R CVA, eliquis started and pt remained on rate control   Bifascicular block    CAD (coronary artery disease)    Cath 2009  Occluded LAD, 40-50% RCA and circ managed medically.  Repeat 06/22/15 no change.   Chronic constipation    slow transit.  Barium enema to eval for colonic stricture 10/2014 was NORMAL   COPD (chronic obstructive pulmonary disease) (HCC)    Changes noted on CXR 2017   CVA (cerebral vascular accident) ([place]) 03/2020   L sided weakness; a-fib-->eliquis + rate control   Dementia (HCC)    GERD (gastroesophageal reflux disease)    (also LPR) Schatzki's ring, s/p dil '97; food impaction 08/2010, on PPI therapy since w/out further sx so no dil performed   History of herpes zoster 06/2015   right side of chest; presented with chest pain mimicking [place]--cardiac eval showed stable CAD compared to 2009.   Hyperkalemia 06/2017   suspected due to increase of losartan from 73m bid to 548mbid.  Dose lowered back to 25 mg bid K normalized.  After increase again to 50 qAM and 25 qPM potassium 4.8 so I did not increase dose any further (07/20/17).   Hyperlipidemia    statin started in hosp for cva 03/2020   Hypertension    permissive elevated bp ok   Hyponatremia 10/03/2011   [facility]  Baseline Na 132.   Iron deficiency anemia 11/30/15; 09/2019   Occult GI bleed: 09/2019 Hb drop to 8.5, iron low, hemoccult +.  Oral iron started->Dr. Buccini eval->heme neg in his office->obs/follow Hb on oral Fe; order BE if Hb not responding or having ongoing heme+ stool. Then hosp for a-fib + CVA, eliquis started b/c no overt bleeding noted.   Mixed stress and urge urinary incontinence    RBBB with left anterior fascicular block    bifascicular block   Recurrent UTI    Bactrim prophyl, then changed to cefdinir (?med rxn?).  Cipro qd as of 08/2018.   Third nerve palsy 05/2013   presented as diplopia; felt by neuro to be microvascular insult so no carotid dopplers needed (CT and MRI neg for CVA)   [place]   Plts 114K   [place]   Xerostomia 2017/2018   Age related glandular atrophy + med effect (oxybutynin and gabapentin).  ANA and sjogren's panel NEG 06/2017.    Past Surgical History:  Procedure Laterality Date   ABDOMINAL HYSTERECTOMY  2000   APPENDECTOMY  2000   ATRIAL FLUTTER ABLATION  06/03/2020   CARDIAC CATHETERIZATION N/A 06/22/2015   Stable single vessel CAD, EF normal.  Procedure: Left Heart Cath and Coronary Angiography;  Surgeon: DaLeonie Green  Ellyn Hack, MD;  Location: [place] CV LAB;  Service: Cardiovascular;  Laterality: N/A;   CATARACT EXTRACTION W/ INTRAOCULAR LENS  IMPLANT, BILATERAL  1990's   CESAREAN SECTION  1959; 1960   COLONOSCOPY  04/2001   BE neg 10/2014   DILATION AND CURETTAGE OF UTERUS     ESOPHAGOGASTRODUODENOSCOPY  1997   for dysphagia->esoph dilatation done   HERNIA REPAIR     abdominal "twice"   LEFT HEART CATHETERIZATION WITH CORONARY ANGIOGRAM N/A 10/04/2011   Procedure: LEFT HEART CATHETERIZATION WITH CORONARY ANGIOGRAM;  Surgeon: Jacolyn Reedy, MD;  Location: [facility] CATH LAB;  Service: Cardiovascular;  Laterality: N/A;   REPAIR KNEE LIGAMENT  ~ 2008   left   Rhythm monitoring  11/02/2020   48H holter->NSR w/1st deg AV  block, occ PADs, rare PVCs, frequent sinus brady, lowest HR 47, no pauses.   TONSILLECTOMY     as a child   TRANSTHORACIC ECHOCARDIOGRAM  05/2013; 02/2020; 03/24/20   02/2020 (new dx a-fib) EF 60-65%, valves ok, no wall motion abnorm. 03/2020, pt in a-fib/flutter EF 55-60%, valves ok, small peric effus.     Outpatient Medications Prior to Visit  Medication Sig Dispense Refill   amiodarone (PACERONE) 200 MG tablet Take 200 mg by mouth daily. Take 1/2 pill daily by cardiologist     amLODipine (NORVASC) 10 MG tablet Take 1 tablet (10 mg total) by mouth daily. 90 tablet 3   aspirin EC 81 MG tablet Take 81 mg by mouth daily. Swallow whole.     atorvastatin (LIPITOR) 40 MG tablet Take 1 tablet (40 mg total) by mouth daily. 30 tablet 0   cevimeline (EVOXAC) 30 MG capsule Take 1 capsule (30 mg total) by mouth in the morning and at bedtime. 180 capsule 3   Cholecalciferol (VITAMIN D3) 1000 UNITS CAPS Take 1,000 Units by mouth daily.     ciprofloxacin (CIPRO) 250 MG tablet TAKE 1 TABLET BY MOUTH EVERY DAY FOR BLADDER INFECTION PROPHYLAXIS. 90 tablet 1   FERROUS SULFATE PO Take 1 tablet by mouth daily.      irbesartan (AVAPRO) 150 MG tablet Take 1 tablet (150 mg total) by mouth daily. 90 tablet 3   nitroGLYCERIN (NITROSTAT) 0.4 MG SL tablet Place 1 tablet (0.4 mg total) under the tongue every 5 (five) minutes as needed for chest pain. 25 tablet 12   Omega-3 Fatty Acids (FISH OIL) 1000 MG CAPS Take 1 capsule by mouth daily.     oxybutynin (DITROPAN) 5 MG tablet TAKE 1 TABLET BY MOUTH EVERY OTHER DAY 45 tablet 1   pantoprazole (PROTONIX) 40 MG tablet TAKE 1 TABLET BY MOUTH EVERY DAY 90 tablet 0   polyethylene glycol powder (GLYCOLAX/MIRALAX) powder TAKE 17 G BY MOUTH 2 (TWO) TIMES DAILY. (Patient taking differently: Take 17 g by mouth as needed.) 527 g 5   sulfamethoxazole-trimethoprim (BACTRIM DS) 800-160 MG tablet Take 1 tablet by mouth 2 (two) times daily. 10 tablet 0   fluticasone (FLONASE) 50 MCG/ACT  nasal spray SPRAY 2 SPRAYS INTO EACH NOSTRIL EVERY DAY (Patient not taking: Reported on 04/13/2022) 48 mL 0   No facility-administered medications prior to visit.    Allergies  Allergen Reactions   Cefdinir Hives   Macrobid [Nitrofurantoin Macrocrystal] Other (See Comments)    "I had chills & fever"    Review of Systems  As per HPI  PE:    07/19/2022    3:31 PM 04/13/2022    1:16 PM 09/19/2021    1:17 PM  Vitals with BMI  Height 5' 4"$  5' 4"$  5' 4"$   Weight 127 lbs 6 oz 124 lbs 13 oz 132 lbs 10 oz  BMI 21.86 [id number]  Systolic 123456 [id number]  Diastolic 58 63 62  Pulse 57 62 67     Physical Exam  Gen: Alert, well appearing.  Patient is oriented to person, place, time, and situation. AFFECT: pleasant, lucid thought and speech EARS: EAC on L with minimal/nonobstructing cerumen, TM normal. R EAC 100% cerumen impaction, TM not visible  LABS:  Last metabolic panel Lab Results  Component Value Date   GLUCOSE 108 (H) 04/13/2022   NA 137 04/13/2022   K 4.0 04/13/2022   CL 103 04/13/2022   CO2 29 04/13/2022   BUN 35 (H) 04/13/2022   CREATININE 1.04 04/13/2022   GFRNONAA >60 03/26/2020   CALCIUM 8.3 (L) 04/13/2022   PROT 6.1 04/13/2022   ALBUMIN 3.9 04/13/2022   BILITOT 0.5 04/13/2022   ALKPHOS 52 04/13/2022   AST 17 04/13/2022   ALT 11 04/13/2022   ANIONGAP 12 03/26/2020   IMPRESSION AND PLAN:  Cerumen impaction right ear. Consent obtained. Procedure: Cerumen Disimpaction  Warm water was applied and gentle ear lavage performed on right ear. There were no complications and following the disimpaction the tympanic membrane was visible and intact .  Auditory canal with mildly abraded epithelium after procedure.  No active bleeding.  The patient reported relief of symptoms after removal of cerumen.  Tolerated procedure well.    An After Visit Summary was printed and given to the patient.  FOLLOW UP: Return for as needed.  Signed:  Crissie Sickles, MD            07/19/2022

## 2022-07-19 NOTE — Addendum Note (Signed)
Addended by: Deveron Furlong D on: 07/19/2022 08:31 AM   Modules accepted: Orders

## 2022-08-03 ENCOUNTER — Telehealth: Payer: Self-pay

## 2022-08-03 NOTE — Patient Outreach (Signed)
  Care Coordination   08/03/2022 Name: Sabrina Alvarez MRN: [id number] DOB: 1927/10/25   Care Coordination Outreach Attempts:  A second unsuccessful outreach was attempted today to offer the patient with information about available care coordination services as a benefit of their health plan.     Follow Up Plan:  Additional outreach attempts will be made to offer the patient care coordination information and services.   Encounter Outcome:  No Answer   Care Coordination Interventions:  No, not indicated     Enzo Montgomery, RN,BSN,CCM [facility] Care Management Care Management Community Coordinator Direct Phone: [phone] Toll Free: [phone] Fax: [phone]

## 2022-09-23 ENCOUNTER — Other Ambulatory Visit: Payer: Self-pay | Admitting: Family Medicine

## 2022-09-29 ENCOUNTER — Ambulatory Visit: Payer: Medicare HMO | Admitting: Family Medicine

## 2022-10-19 ENCOUNTER — Telehealth: Payer: Self-pay

## 2022-10-19 NOTE — Telephone Encounter (Signed)
Tried calling pt, unsure if pt is hanging up or her phone is disconnecting. Please schedule if call returned.

## 2022-10-19 NOTE — Telephone Encounter (Signed)
Tried calling patient, unable to LVM. If pt returns call, please schedule for follow up with Dr.McGowen.

## 2022-10-19 NOTE — Telephone Encounter (Signed)
Spoke with pt's son to schedule follow up for pt, he advised [place] to contact pt via home phone after 12:30p when caregiver is there to schedule. Will call later

## 2022-11-01 DIAGNOSIS — Z1330 Encounter for screening examination for mental health and behavioral disorders, unspecified: Secondary | ICD-10-CM | POA: Diagnosis not present

## 2022-11-01 DIAGNOSIS — Z79899 Other long term (current) drug therapy: Secondary | ICD-10-CM | POA: Diagnosis not present

## 2022-11-20 NOTE — Patient Instructions (Signed)

## 2022-11-22 ENCOUNTER — Encounter: Payer: Self-pay | Admitting: Family Medicine

## 2022-11-22 ENCOUNTER — Ambulatory Visit (INDEPENDENT_AMBULATORY_CARE_PROVIDER_SITE_OTHER): Payer: Medicare HMO | Admitting: Family Medicine

## 2022-11-22 VITALS — BP 140/70 | HR 70 | Ht 63.0 in | Wt 125.0 lb

## 2022-11-22 DIAGNOSIS — I480 Paroxysmal atrial fibrillation: Secondary | ICD-10-CM | POA: Diagnosis not present

## 2022-11-22 DIAGNOSIS — E7800 Pure hypercholesterolemia, unspecified: Secondary | ICD-10-CM

## 2022-11-22 DIAGNOSIS — I10 Essential (primary) hypertension: Secondary | ICD-10-CM

## 2022-11-22 DIAGNOSIS — Z79899 Other long term (current) drug therapy: Secondary | ICD-10-CM

## 2022-11-22 DIAGNOSIS — N1831 Chronic kidney disease, stage 3a: Secondary | ICD-10-CM | POA: Diagnosis not present

## 2022-11-22 DIAGNOSIS — Z862 Personal history of diseases of the blood and blood-forming organs and certain disorders involving the immune mechanism: Secondary | ICD-10-CM | POA: Diagnosis not present

## 2022-11-22 DIAGNOSIS — N2889 Other specified disorders of kidney and ureter: Secondary | ICD-10-CM

## 2022-11-22 MED ORDER — OXYBUTYNIN CHLORIDE 5 MG PO TABS: 5.0000 mg | ORAL_TABLET | ORAL | 1 refills | Status: DC

## 2022-11-22 MED ORDER — PANTOPRAZOLE SODIUM 40 MG PO TBEC: 40.0000 mg | DELAYED_RELEASE_TABLET | Freq: Every day | ORAL | 3 refills | Status: DC

## 2022-11-22 NOTE — Progress Notes (Signed)
OFFICE VISIT  11/22/2022  CC:  Chief Complaint  Patient presents with   Annual Exam    Fasting CPE    Patient is a 89 y.o. female who presents accompanied by her son for 3-month follow-up hypertension, chronic renal insufficiency stage III, A-flutter/fib, and history of iron deficiency anemia. A/P as of last visit: "#1 recurrent UTI. I think her recent symptoms are more due to concentrated urine rather than infection. Trace LE on dipstick UA here today.  We will send for culture.  No new antibiotics at this time.  Continue Cipro 250 daily for prophylaxis.   #2 hypertension, well controlled on amlodipine 10 mg daily and irbesartan 150 mg daily. Will take clonidine patch off of her med list. Electrolytes and creatinine today.   3.  Chronic renal insufficiency stage III. She avoids NSAIDs.  Encouraged better hydration. Electrolytes and creatinine today.   #4 history of A-fib/flutter. She is doing well status post ablation in December 2021. Her cardiologist has continued her on 100 mg of amiodarone daily.   #5 Hx of IDA from occult GI bleed 2021: Has seen GI, elected for no interventional/procedureal eval.  Continue oral iron supplement. CBC and iron panel today.   #6 hyperlipidemia. Doing well on atorvastatin 40 mg a day. Not fasting today. Hepatic panel today.  INTERIM HX: Vedha says she feels well. She walks around some in her yard, always supervised. Memory continues to decline.  No recent falls.  She followed up with her cardiologist on 11/01/2022 and she was doing well so her amiodarone was decreased to one half of a 200 mg tab every other day. Blood pressure that day was 120/70.  She does not monitor blood pressure at home.  Past Medical History:  Diagnosis Date   Arthritis    "think it's osteo; got some in my hands"   Atrial fibrillation (HCC) 02/10/2020   Dr. Baltazar Apo, [employer] cards-[place],ASA and BB (stopped her ARB)-in sinus-monitor planned but pt hosp for acute  R CVA, eliquis started and pt remained on rate control   Bifascicular block    CAD (coronary artery disease)    Cath 2009  Occluded LAD, 40-50% RCA and circ managed medically.  Repeat 06/22/15 no change.   Chronic constipation    slow transit.  Barium enema to eval for colonic stricture 10/2014 was NORMAL   COPD (chronic obstructive pulmonary disease) (HCC)    Changes noted on CXR 2017   CVA (cerebral vascular accident) (HCC) 03/2020   L sided weakness; a-fib-->eliquis + rate control   Dementia (HCC)    GERD (gastroesophageal reflux disease)    (also LPR) Schatzki's ring, s/p dil '97; food impaction 08/2010, on PPI therapy since w/out further sx so no dil performed   History of herpes zoster 06/2015   right side of chest; presented with chest pain mimicking [place]--cardiac eval showed stable CAD compared to 2009.   Hyperkalemia 06/2017   suspected due to increase of losartan from 25mg  bid to 50mg  bid.  Dose lowered back to 25 mg bid K normalized.  After increase again to 50 qAM and 25 qPM potassium 4.8 so I did not increase dose any further (07/20/17).   Hyperlipidemia    statin started in hosp for cva 03/2020   Hypertension    permissive elevated bp ok   Hyponatremia 10/03/2011   Na 131 on labs 02/2015 at [facility].  Baseline Na 132.   Iron deficiency anemia 11/30/15; 09/2019   Occult GI bleed: 09/2019 Hb drop  to 8.5, iron low, hemoccult +.  Oral iron started->Dr. Buccini eval->heme neg in his office->obs/follow Hb on oral Fe; order BE if Hb not responding or having ongoing heme+ stool. Then hosp for a-fib + CVA, eliquis started b/c no overt bleeding noted.   Mixed stress and urge urinary incontinence    RBBB with left anterior fascicular block    bifascicular block   Recurrent UTI    Bactrim prophyl, then changed to cefdinir (?med rxn?).  Cipro qd as of 08/2018.   Third nerve palsy 05/2013   presented as diplopia; felt by neuro to be microvascular insult so no carotid dopplers needed (CT and MRI  neg for CVA)   Thrombocytopenia (HCC) 02/2013   Plts 114K   Uterine cancer (HCC) 2000   Xerostomia 2017/2018   Age related glandular atrophy + med effect (oxybutynin and gabapentin).  ANA and sjogren's panel NEG 06/2017.    Past Surgical History:  Procedure Laterality Date   ABDOMINAL HYSTERECTOMY  2000   APPENDECTOMY  2000   ATRIAL FLUTTER ABLATION  06/03/2020   CARDIAC CATHETERIZATION N/A 06/22/2015   Stable single vessel CAD, EF normal.  Procedure: Left Heart Cath and Coronary Angiography;  Surgeon: Marykay Lex, MD;  Location: [facility] INVASIVE CV LAB;  Service: Cardiovascular;  Laterality: N/A;   CATARACT EXTRACTION W/ INTRAOCULAR LENS  IMPLANT, BILATERAL  1990's   CESAREAN SECTION  1959; 1960   COLONOSCOPY  04/2001   BE neg 10/2014   DILATION AND CURETTAGE OF UTERUS     ESOPHAGOGASTRODUODENOSCOPY  1997   for dysphagia->esoph dilatation done   HERNIA REPAIR     abdominal "twice"   LEFT HEART CATHETERIZATION WITH CORONARY ANGIOGRAM N/A 10/04/2011   Procedure: LEFT HEART CATHETERIZATION WITH CORONARY ANGIOGRAM;  Surgeon: Othella Boyer, MD;  Location: [facility] CATH LAB;  Service: Cardiovascular;  Laterality: N/A;   REPAIR KNEE LIGAMENT  ~ 2008   left   Rhythm monitoring  11/02/2020   48H holter->NSR w/1st deg AV block, occ PADs, rare PVCs, frequent sinus brady, lowest HR 47, no pauses.   TONSILLECTOMY     as a child   TRANSTHORACIC ECHOCARDIOGRAM  05/2013; 02/2020; 03/24/20   02/2020 (new dx a-fib) EF 60-65%, valves ok, no wall motion abnorm. 03/2020, pt in a-fib/flutter EF 55-60%, valves ok, small peric effus.     Outpatient Medications Prior to Visit  Medication Sig Dispense Refill   amiodarone (PACERONE) 200 MG tablet Take 200 mg by mouth daily. Take 1/2 pill daily by cardiologist     amLODipine (NORVASC) 10 MG tablet Take 1 tablet (10 mg total) by mouth daily. 90 tablet 3   aspirin EC 81 MG tablet Take 81 mg by mouth daily. Swallow whole.     atorvastatin (LIPITOR) 40 MG tablet Take  1 tablet (40 mg total) by mouth daily. 30 tablet 0   cevimeline (EVOXAC) 30 MG capsule Take 1 capsule (30 mg total) by mouth in the morning and at bedtime. 180 capsule 3   Cholecalciferol (VITAMIN D3) 1000 UNITS CAPS Take 1,000 Units by mouth daily.     ciprofloxacin (CIPRO) 250 MG tablet TAKE 1 TABLET BY MOUTH EVERY DAY FOR BLADDER INFECTION PROPHYLAXIS. 90 tablet 1   FERROUS SULFATE PO Take 1 tablet by mouth daily.      irbesartan (AVAPRO) 150 MG tablet Take 1 tablet (150 mg total) by mouth daily. 90 tablet 3   nitroGLYCERIN (NITROSTAT) 0.4 MG SL tablet Place 1 tablet (0.4 mg total) under the tongue  every 5 (five) minutes as needed for chest pain. 25 tablet 12   Omega-3 Fatty Acids (FISH OIL) 1000 MG CAPS Take 1 capsule by mouth daily.     oxybutynin (DITROPAN) 5 MG tablet TAKE 1 TABLET BY MOUTH EVERY OTHER DAY 45 tablet 1   pantoprazole (PROTONIX) 40 MG tablet Take 1 tablet (40 mg total) by mouth daily. PT DUE FOR APPT MAY 90 tablet 0   polyethylene glycol powder (GLYCOLAX/MIRALAX) powder TAKE 17 G BY MOUTH 2 (TWO) TIMES DAILY. (Patient taking differently: Take 17 g by mouth as needed.) 527 g 5   fluticasone (FLONASE) 50 MCG/ACT nasal spray SPRAY 2 SPRAYS INTO EACH NOSTRIL EVERY DAY (Patient not taking: Reported on 04/13/2022) 48 mL 0   sulfamethoxazole-trimethoprim (BACTRIM DS) 800-160 MG tablet Take 1 tablet by mouth 2 (two) times daily. 10 tablet 0   No facility-administered medications prior to visit.    Allergies  Allergen Reactions   Cefdinir Hives   Macrobid [Nitrofurantoin Macrocrystal] Other (See Comments)    "I had chills & fever"    Review of Systems As per HPI  PE:    11/22/2022   11:01 AM 07/19/2022    3:31 PM 04/13/2022    1:16 PM  Vitals with BMI  Height 5\' 3"  5\' 4"  5\' 4"   Weight 125 lbs 127 lbs 6 oz 124 lbs 13 oz  BMI 22.15 21.86 21.41  Systolic 165 119 161  Diastolic 70 58 63  Pulse 70 57 62  Initial bp today 165/70 Manual bp at end of visit 140/70  Physical  Exam  Gen: Alert, well appearing.  Patient is oriented to person, place, time, and situation. AFFECT: pleasant, lucid thought and speech. ENT: Ears: EACs clear, normal epithelium.  TMs with good light reflex and landmarks bilaterally.  Eyes: no injection, icteris, swelling, or exudate.  EOMI, PERRLA. Nose: no drainage or turbinate edema/swelling.  No injection or focal lesion.  Mouth: lips without lesion/swelling.  Oral mucosa pink and moist.  Dentition intact and without obvious caries or gingival swelling.  Oropharynx without erythema, exudate, or swelling.  Neck: supple/nontender.  No LAD, mass, or TM.  Carotid pulses 2+ bilaterally, without bruits. CV: RRR, 2/6 systolic murmur, no diastolic murmur, no r/g.   LUNGS: CTA bilat, nonlabored resps, good aeration in all lung fields. ABD: soft, NT, ND, BS normal.  No hepatospenomegaly or mass.  No bruits. EXT: no clubbing or cyanosis. She has 1+ left lower extremity pitting edema and trace right lower extremity pitting edema. Musculoskeletal: no joint swelling, erythema, warmth, or tenderness.  ROM of all joints intact. Skin - no sores or suspicious lesions or rashes or color changes  LABS:  Last CBC Lab Results  Component Value Date   WBC 4.9 04/13/2022   HGB 11.2 (L) 04/13/2022   HCT 33.2 (L) 04/13/2022   MCV 92.4 04/13/2022   MCH 30.7 01/05/2021   RDW 14.0 04/13/2022   PLT 133.0 (L) 04/13/2022   Lab Results  Component Value Date   IRON 63 04/13/2022   TIBC 200 (L) 04/13/2022   FERRITIN 124 04/13/2022   Last metabolic panel Lab Results  Component Value Date   GLUCOSE 108 (H) 04/13/2022   NA 137 04/13/2022   K 4.0 04/13/2022   CL 103 04/13/2022   CO2 29 04/13/2022   BUN 35 (H) 04/13/2022   CREATININE 1.04 04/13/2022   GFRNONAA >60 03/26/2020   CALCIUM 8.3 (L) 04/13/2022   PROT 6.1 04/13/2022   ALBUMIN 3.9 04/13/2022  BILITOT 0.5 04/13/2022   ALKPHOS 52 04/13/2022   AST 17 04/13/2022   ALT 11 04/13/2022   ANIONGAP 12  03/26/2020   Last lipids Lab Results  Component Value Date   CHOL 126 04/13/2022   HDL 49.20 04/13/2022   LDLCALC 64 04/13/2022   TRIG 64.0 04/13/2022   CHOLHDL 3 04/13/2022   Last hemoglobin A1c Lab Results  Component Value Date   HGBA1C 5.6 03/24/2020   Last thyroid functions Lab Results  Component Value Date   TSH 1.55 11/29/2015   T3TOTAL 78.4 (L) 05/10/2015   Last vitamin B12 and Folate Lab Results  Component Value Date   VITAMINB12 680 02/24/2016   IMPRESSION AND PLAN:  No problem-specific Assessment & Plan notes found for this encounter.  1 hypertension, well controlled on amlodipine 10 mg daily and irbesartan 150 mg daily. Electrolytes and creatinine today.   2.  Chronic renal insufficiency stage III. She avoids NSAIDs.   Electrolytes and creatinine today.   3. history of A-fib/flutter. She is doing well status post ablation in December 2021. Her cardiologist recently decreased her amiodarone to one half tab every other day. Checking TSH today. Her cardiologist has recently ordered a chest x-ray for her.   4. Hx of IDA from occult GI bleed 2021: Has seen GI, elected for no interventional/procedureal eval.  Continue oral iron supplement. CBC today.5. hyperlipidemia. Doing well on atorvastatin 40 mg a day. Lipid and hepatic panel today.  Will forward lab results to her cardiologist as well.  An After Visit Summary was printed and given to the patient.  FOLLOW UP: No follow-ups on file.  Signed:  Santiago Bumpers, MD           11/22/2022

## 2022-11-22 NOTE — Addendum Note (Signed)
Addended by: Emi Holes D on: 11/22/2022 12:01 PM   Modules accepted: Orders

## 2022-11-29 ENCOUNTER — Other Ambulatory Visit (INDEPENDENT_AMBULATORY_CARE_PROVIDER_SITE_OTHER): Payer: Medicare HMO

## 2022-11-29 DIAGNOSIS — E7800 Pure hypercholesterolemia, unspecified: Secondary | ICD-10-CM | POA: Diagnosis not present

## 2022-11-29 DIAGNOSIS — I480 Paroxysmal atrial fibrillation: Secondary | ICD-10-CM | POA: Diagnosis not present

## 2022-11-29 DIAGNOSIS — Z79899 Other long term (current) drug therapy: Secondary | ICD-10-CM | POA: Diagnosis not present

## 2022-11-29 DIAGNOSIS — N1831 Chronic kidney disease, stage 3a: Secondary | ICD-10-CM

## 2022-11-29 DIAGNOSIS — I10 Essential (primary) hypertension: Secondary | ICD-10-CM | POA: Diagnosis not present

## 2022-11-29 DIAGNOSIS — Z862 Personal history of diseases of the blood and blood-forming organs and certain disorders involving the immune mechanism: Secondary | ICD-10-CM | POA: Diagnosis not present

## 2022-11-29 LAB — CBC
HCT: 35.4 % — ABNORMAL LOW (ref 36.0–46.0)
Hemoglobin: 11.7 g/dL — ABNORMAL LOW (ref 12.0–15.0)
MCHC: 33.2 g/dL (ref 30.0–36.0)
MCV: 92 fl (ref 78.0–100.0)
Platelets: 146 10*3/uL — ABNORMAL LOW (ref 150.0–400.0)
RBC: 3.85 Mil/uL — ABNORMAL LOW (ref 3.87–5.11)
RDW: 13.8 % (ref 11.5–15.5)
WBC: 5.2 10*3/uL (ref 4.0–10.5)

## 2022-11-29 LAB — COMPREHENSIVE METABOLIC PANEL
ALT: 10 U/L (ref 0–35)
AST: 18 U/L (ref 0–37)
Albumin: 4.1 g/dL (ref 3.5–5.2)
Alkaline Phosphatase: 51 U/L (ref 39–117)
BUN: 32 mg/dL — ABNORMAL HIGH (ref 6–23)
CO2: 27 mEq/L (ref 19–32)
Calcium: 9.1 mg/dL (ref 8.4–10.5)
Chloride: 101 mEq/L (ref 96–112)
Creatinine, Ser: 1.04 mg/dL (ref 0.40–1.20)
GFR: 46.42 mL/min — ABNORMAL LOW (ref >60.00)
Glucose, Bld: 94 mg/dL (ref 70–99)
Potassium: 4.1 mEq/L (ref 3.5–5.1)
Sodium: 135 mEq/L (ref 135–145)
Total Bilirubin: 0.8 mg/dL (ref 0.2–1.2)
Total Protein: 6.6 g/dL (ref 6.0–8.3)

## 2022-11-29 LAB — LIPID PANEL
Cholesterol: 146 mg/dL (ref 0–200)
HDL: 50.5 mg/dL (ref >39.00)
LDL Cholesterol: 84 mg/dL (ref 0–99)
NonHDL: 95.51
Total CHOL/HDL Ratio: 3
Triglycerides: 58 mg/dL (ref 0.0–149.0)
VLDL: 11.6 mg/dL (ref 0.0–40.0)

## 2022-11-29 LAB — TSH: TSH: 2.21 u[IU]/mL (ref 0.35–5.50)

## 2022-12-23 ENCOUNTER — Other Ambulatory Visit: Payer: Self-pay | Admitting: Family Medicine

## 2023-01-16 ENCOUNTER — Other Ambulatory Visit: Payer: Self-pay | Admitting: Family Medicine

## 2023-02-17 ENCOUNTER — Other Ambulatory Visit: Payer: Self-pay | Admitting: Family Medicine

## 2023-02-19 ENCOUNTER — Ambulatory Visit (INDEPENDENT_AMBULATORY_CARE_PROVIDER_SITE_OTHER): Payer: Medicare HMO

## 2023-02-19 DIAGNOSIS — Z23 Encounter for immunization: Secondary | ICD-10-CM

## 2023-02-19 NOTE — Progress Notes (Unsigned)
Pt presented for yearly flu vaccine, administered IM left deltoid and tolerated well. VIS given and written consent provided

## 2023-03-13 ENCOUNTER — Ambulatory Visit (INDEPENDENT_AMBULATORY_CARE_PROVIDER_SITE_OTHER): Payer: Medicare HMO | Admitting: Urgent Care

## 2023-03-13 ENCOUNTER — Encounter: Payer: Self-pay | Admitting: Urgent Care

## 2023-03-13 VITALS — BP 120/60 | HR 54 | Temp 97.8°F | Wt 121.0 lb

## 2023-03-13 DIAGNOSIS — R309 Painful micturition, unspecified: Secondary | ICD-10-CM | POA: Diagnosis not present

## 2023-03-13 DIAGNOSIS — R300 Dysuria: Secondary | ICD-10-CM

## 2023-03-13 DIAGNOSIS — N3000 Acute cystitis without hematuria: Secondary | ICD-10-CM | POA: Diagnosis not present

## 2023-03-13 LAB — POC URINALSYSI DIPSTICK (AUTOMATED)
Bilirubin, UA: NEGATIVE
Glucose, UA: NEGATIVE
Ketones, UA: NEGATIVE
Nitrite, UA: NEGATIVE
Protein, UA: POSITIVE — AB
Spec Grav, UA: 1.015 (ref 1.010–1.025)
Urobilinogen, UA: 0.2 U/dL
pH, UA: 6 (ref 5.0–8.0)

## 2023-03-13 MED ORDER — SULFAMETHOXAZOLE-TRIMETHOPRIM 400-80 MG PO TABS: 1.0000 | ORAL_TABLET | Freq: Two times a day (BID) | ORAL | 0 refills | Status: AC

## 2023-03-13 NOTE — Patient Instructions (Addendum)
Your symptoms are consistent with a urinary tract infection. Urine sample positive for leukocytes. Will send off urine culture and call if a change in antibiotics is indicated. Start taking the antibiotic twice daily until completed. (One week) Drink plenty of water on the antibiotic. Monitor for any change in or worsening symptoms; fever, hematuria or flank pain would warrant a recheck.

## 2023-03-13 NOTE — Progress Notes (Signed)
Established Patient Office Visit  Subjective:  Patient ID: Sabrina Alvarez, female    DOB: August 19, 1927  Age: 89 y.o. MRN: [id number]  Chief Complaint  Patient presents with   Urinary Frequency    She states she has been having burning and frequency with urination that started last week.    Pleasant 89 year old female with a past medical history for recurrent UTIs presents today due to concerns of frequency, hesitancy, and dysuria.  She is unable to cite the exact timeframe of her symptoms, but believes it may have been around a week.  She denies any vaginal discharge, pelvic pain, itching, or lesions.  She denies fever, flank pain, weakness or confusion.  She does reports some intermittent low back pain, but none currently and does not believe this to be related to current urinary sx. No visible hematuria.  She has not tried any over-the-counter medications.  Last UTI appears to be around 11 months ago, patient states symptoms feel similar.  Historically, patient has had a ESBL E. coli which is resistant to numerous antibiotics.  Urinary Frequency  Associated symptoms include frequency, hesitancy and urgency. Pertinent negatives include no chills, discharge, flank pain or hematuria. Her past medical history is significant for recurrent UTIs.         ROS: as noted in HPI  Objective:     BP 120/60   Pulse (!) 54   Temp 97.8 F (36.6 C) (Temporal)   Wt 121 lb (54.9 kg)   SpO2 98%   BMI 21.43 kg/m    Physical Exam Vitals and nursing note reviewed. Exam conducted with a chaperone present.  Constitutional:      General: She is not in acute distress.    Appearance: Normal appearance. She is not ill-appearing, toxic-appearing or diaphoretic.  HENT:     Head: Normocephalic and atraumatic.  Cardiovascular:     Rate and Rhythm: Normal rate.  Pulmonary:     Effort: Pulmonary effort is normal. No respiratory distress.  Abdominal:     General: Abdomen is flat.     Palpations:  Abdomen is soft.     Tenderness: There is no abdominal tenderness. There is no right CVA tenderness, left CVA tenderness, guarding or rebound.  Skin:    General: Skin is warm and dry.     Findings: No erythema or rash.  Neurological:     General: No focal deficit present.     Mental Status: She is alert and oriented to person, place, and time.     Gait: Gait abnormal (slow, kyphotic gait; ambulates with cane).      Results for orders placed or performed in visit on 03/13/23  POCT Urinalysis Dipstick (Automated)  Result Value Ref Range   Color, UA yellow    Clarity, UA cloudy    Glucose, UA Negative Negative   Bilirubin, UA Negative    Ketones, UA Negative    Spec Grav, UA 1.015 1.010 - 1.025   Blood, UA +    pH, UA 6.0 5.0 - 8.0   Protein, UA Positive (A) Negative   Urobilinogen, UA 0.2 0.2 or 1.0 E.U./dL   Nitrite, UA negative    Leukocytes, UA Large (3+) (A) Negative      The ASCVD Risk score (Arnett DK, et al., 2019) failed to calculate for the following reasons:   The 2019 ASCVD risk score is only valid for ages 80 to 8   The patient has a prior MI or stroke diagnosis  Assessment &  Plan:   1. Urinary pain - POCT Urinalysis Dipstick (Automated) - Urine Culture  2. Dysuria - Urine Culture  3. Acute cystitis without hematuria Pt UA in office suggestive of UTI. Will send out urine culture given her hx of ESBL e coli infections in the past. She cannot tolerate macrobid. Will start single-strength bactrim BID x 7 days, tailor therapy pending cx results. RTC/ER precautions discussed.  No follow-ups on file.   Maretta Bees, PA

## 2023-03-15 LAB — URINE CULTURE
MICRO NUMBER:: 15570069
SPECIMEN QUALITY:: ADEQUATE

## 2023-03-16 ENCOUNTER — Telehealth: Payer: Self-pay | Admitting: Family Medicine

## 2023-03-16 NOTE — Telephone Encounter (Signed)
Noted  

## 2023-03-16 NOTE — Telephone Encounter (Signed)
Patient is aware of results and reports she is feeling better. Spoke with her and her son and she is taking the medication per her son.

## 2023-04-25 ENCOUNTER — Other Ambulatory Visit: Payer: Self-pay | Admitting: Family Medicine

## 2023-04-29 ENCOUNTER — Other Ambulatory Visit: Payer: Self-pay | Admitting: Family Medicine

## 2023-05-08 ENCOUNTER — Ambulatory Visit: Payer: Medicare HMO | Admitting: Urgent Care

## 2023-05-08 ENCOUNTER — Encounter: Payer: Self-pay | Admitting: Urgent Care

## 2023-05-08 VITALS — BP 120/77 | HR 68 | Wt 123.0 lb

## 2023-05-08 DIAGNOSIS — H903 Sensorineural hearing loss, bilateral: Secondary | ICD-10-CM | POA: Diagnosis not present

## 2023-05-08 DIAGNOSIS — N3000 Acute cystitis without hematuria: Secondary | ICD-10-CM | POA: Diagnosis not present

## 2023-05-08 DIAGNOSIS — N390 Urinary tract infection, site not specified: Secondary | ICD-10-CM

## 2023-05-08 LAB — POC URINALSYSI DIPSTICK (AUTOMATED)
Bilirubin, UA: NEGATIVE
Glucose, UA: NEGATIVE
Ketones, UA: NEGATIVE
Nitrite, UA: NEGATIVE
Protein, UA: POSITIVE — AB
Spec Grav, UA: 1.025 (ref 1.010–1.025)
Urobilinogen, UA: 0.2 U/dL
pH, UA: 6 (ref 5.0–8.0)

## 2023-05-08 MED ORDER — SULFAMETHOXAZOLE-TRIMETHOPRIM 400-80 MG PO TABS: 1.0000 | ORAL_TABLET | Freq: Two times a day (BID) | ORAL | 0 refills | Status: AC

## 2023-05-08 MED ORDER — TRIMETHOPRIM 100 MG PO TABS: 100.0000 mg | ORAL_TABLET | Freq: Every day | ORAL | 0 refills | Status: AC

## 2023-05-08 NOTE — Patient Instructions (Addendum)
Your symptoms are consistent with a urinary tract infection.  Start taking the antibiotic (bactrim, also known as sulfamethoxazole-trimethoprim) twice daily until completed. (One week total) We will send out your urine culture and only call if a change in treatment is necessary. Monitor for any change in or worsening symptoms; fever, hematuria or flank pain would warrant a recheck.  Please try to always wipe front to back as wiping otherwise increases the risk for infections. Increase your water intake. Try to drink three 16oz waters daily.  Please STOP your daily ciprofloxacin as your prior urine cultures showed resistance to this antibiotic. This medication will not work. For urinary prevention, please start taking OTC cranberry caps. I have also called in trimethoprim 100mg  tab, an antibiotic to start taking daily once you complete the bactrim. (Start this on 05/15/23)  Please follow up with your audiologist. I suspect you need new hearing aids. There is no fluid in your ears, infection, or ear wax blockage.

## 2023-05-08 NOTE — Assessment & Plan Note (Signed)
Urinary Tract Infection Recurrent UTIs with dysuria starting this week. No hematuria or fever. Previous urine culture showed resistance to multiple antibiotics. Patient has been on Cipro, which may not be effective based on previous culture. Patient's hydration may be suboptimal. -UA in office positive for infection; urine sent out for culture and sensitivity. -Increase water intake to at least two 16-ounce bottles per day, with a goal of four. -single strength bactrim BID x 7 days. -AFTER completing 7 days of bactrim, start plain trimethoprim IN PLACE of cipro for prevention

## 2023-05-08 NOTE — Assessment & Plan Note (Signed)
Patient reports difficulty hearing, possibly related to hearing aid function. No signs of ear infection or wax impaction. -Recommend patient to follow up with audiologist for hearing aid assessment.

## 2023-05-08 NOTE — Progress Notes (Signed)
Established Patient Office Visit  Subjective:  Patient ID: Sabrina Alvarez, female    DOB: 1928/03/15  Age: 89 y.o. MRN: [id number]  Chief Complaint  Patient presents with   Urinary Frequency    Urinary frequency and burning. She is also having trouble hearing today.    Discussed the use of AI software for clinical note transcription with the patient who gave verbal consent to proceed.   89yo female with a history of recurrent urinary tract infections (UTIs), presents with a new onset of urinary pain that started within the past week. The pain is not consistent with every urination but is increasing in frequency. She denies hematuria and fever but reports lower back pain. The patient's son, who is also a caregiver, expressed concern about the patient's hearing, which has been worsening. The patient uses hearing aids, which she removes at night, but has been changing batteries frequently, suggesting a possible issue with the hearing aids themselves.  The patient has a history of UTIs, with the last one occurring two months prior. The previous UTI was resistant to multiple antibiotics, with only two PO antibiotics showing effectiveness. The patient has been on Cipro daily for prevention.  The patient's fluid intake is less than ideal, with an estimated intake of less than 16 ounces per day. The patient also wears Depends, which may be contributing to the recurrent UTIs.  The patient's hearing loss is also a concern, with the patient and caregiver both noting a decline in hearing despite the use of hearing aids.   Urinary Frequency  Associated symptoms include frequency.    Patient Active Problem List   Diagnosis Date Noted   Acute cystitis without hematuria 05/08/2023   Benign essential hypertension 06/28/2022   Esophageal reflux 06/28/2022   Heme positive stool 06/28/2022   Vaginal bleeding 06/28/2022   Tachycardia 06/28/2022   Pure hypercholesterolemia 06/28/2022   Mixed urge and  stress incontinence 06/28/2022   Laryngopharyngeal reflux (LPR) 06/28/2022   Hearing loss 06/28/2022   Status post ablation of atrial flutter 06/03/2020   Atrial flutter (HCC) 04/23/2020   Dyslipidemia, goal LDL below 70    PAF (paroxysmal atrial fibrillation) (HCC)    Anemia of chronic disease    Chronic obstructive pulmonary disease (HCC)    Coronary artery disease involving native coronary artery of native heart without angina pectoris    Recurrent UTI    Hyponatremia    CVA (cerebral vascular accident) (HCC) 03/23/2020   Mild cognitive impairment with memory loss 02/11/2020   Syncope and collapse 02/11/2020   Osteopenia 04/22/2019   Overactive bladder 04/22/2019   Hyperkalemia 06/05/2017   Slow transit constipation 02/25/2016   Iron deficiency anemia due to chronic blood loss 12/03/2015   Postherpetic neuralgia 09/01/2015   Cerumen impaction 09/01/2015   Hypothyroidism 05/26/2015   Third nerve palsy 05/29/2013   Hyperlipidemia    History of malignant neoplasm of vagina    Atherosclerosis of native coronary artery of native heart without angina pectoris 06/22/2007   Past Medical History:  Diagnosis Date   Arthritis    "think it's osteo; got some in my hands"   Atrial fibrillation (HCC) 02/10/2020   Dr. Baltazar Apo, [employer] cards-[place],ASA and BB (stopped her ARB)-in sinus-monitor planned but pt hosp for acute R CVA, eliquis started and pt remained on rate control   Bifascicular block    CAD (coronary artery disease)    Cath 2009  Occluded LAD, 40-50% RCA and circ managed medically.  Repeat 06/22/15 no change.  Chronic constipation    slow transit.  Barium enema to eval for colonic stricture 10/2014 was NORMAL   COPD (chronic obstructive pulmonary disease) (HCC)    Changes noted on CXR 2017   CVA (cerebral vascular accident) (HCC) 03/2020   L sided weakness; a-fib-->eliquis + rate control   Dementia (HCC)    GERD (gastroesophageal reflux disease)    (also LPR) Schatzki's  ring, s/p dil '97; food impaction 08/2010, on PPI therapy since w/out further sx so no dil performed   History of herpes zoster 06/2015   right side of chest; presented with chest pain mimicking [place]--cardiac eval showed stable CAD compared to 2009.   Hyperkalemia 06/2017   suspected due to increase of losartan from 25mg  bid to 50mg  bid.  Dose lowered back to 25 mg bid K normalized.  After increase again to 50 qAM and 25 qPM potassium 4.8 so I did not increase dose any further (07/20/17).   Hyperlipidemia    statin started in hosp for cva 03/2020   Hypertension    permissive elevated bp ok   Hyponatremia 10/03/2011   Na 131 on labs 02/2015 at [facility].  Baseline Na 132.   Iron deficiency anemia 11/30/15; 09/2019   Occult GI bleed: 09/2019 Hb drop to 8.5, iron low, hemoccult +.  Oral iron started->Dr. Buccini eval->heme neg in his office->obs/follow Hb on oral Fe; order BE if Hb not responding or having ongoing heme+ stool. Then hosp for a-fib + CVA, eliquis started b/c no overt bleeding noted.   Mixed stress and urge urinary incontinence    RBBB with left anterior fascicular block    bifascicular block   Recurrent UTI    Bactrim prophyl, then changed to cefdinir (?med rxn?).  Cipro qd as of 08/2018.   Third nerve palsy 05/2013   presented as diplopia; felt by neuro to be microvascular insult so no carotid dopplers needed (CT and MRI neg for CVA)   Thrombocytopenia (HCC) 02/2013   Plts 114K   Uterine cancer (HCC) 2000   Xerostomia 2017/2018   Age related glandular atrophy + med effect (oxybutynin and gabapentin).  ANA and sjogren's panel NEG 06/2017.   Social History   Tobacco Use   Smoking status: Never   Smokeless tobacco: Never  Vaping Use   Vaping status: Never Used  Substance Use Topics   Alcohol use: No   Drug use: No      ROS: as noted in HPI  Objective:     BP 120/77   Pulse 68   Wt 123 lb (55.8 kg)   SpO2 97%   BMI 21.79 kg/m  BP Readings from Last 3 Encounters:   05/08/23 120/77  03/13/23 120/60  11/22/22 (!) 140/70   Wt Readings from Last 3 Encounters:  05/08/23 123 lb (55.8 kg)  03/13/23 121 lb (54.9 kg)  11/22/22 125 lb (56.7 kg)      Physical Exam Vitals and nursing note reviewed. Exam conducted with a chaperone present.  Constitutional:      General: She is not in acute distress.    Appearance: Normal appearance. She is not ill-appearing, toxic-appearing or diaphoretic.  HENT:     Head: Normocephalic and atraumatic.     Right Ear: Tympanic membrane, ear canal and external ear normal. There is no impacted cerumen.     Left Ear: Tympanic membrane, ear canal and external ear normal. There is no impacted cerumen.     Nose: Nose normal.     Mouth/Throat:  Mouth: Mucous membranes are moist.  Eyes:     General: No scleral icterus.       Right eye: No discharge.        Left eye: No discharge.     Extraocular Movements: Extraocular movements intact.     Pupils: Pupils are equal, round, and reactive to light.  Cardiovascular:     Rate and Rhythm: Normal rate.  Pulmonary:     Effort: Pulmonary effort is normal. No respiratory distress.  Abdominal:     General: Abdomen is flat.     Palpations: Abdomen is soft.     Tenderness: There is no abdominal tenderness. There is no right CVA tenderness, left CVA tenderness, guarding or rebound.  Skin:    General: Skin is warm and dry.     Findings: No erythema or rash.  Neurological:     General: No focal deficit present.     Mental Status: She is alert and oriented to person, place, and time.     Gait: Gait abnormal (slow, kyphotic gait; ambulates with cane).      Results for orders placed or performed in visit on 05/08/23  POCT Urinalysis Dipstick (Automated)  Result Value Ref Range   Color, UA yellow    Clarity, UA cloudy    Glucose, UA Negative Negative   Bilirubin, UA negative    Ketones, UA negative    Spec Grav, UA 1.025 1.010 - 1.025   Blood, UA 2+    pH, UA 6.0 5.0 - 8.0    Protein, UA Positive (A) Negative   Urobilinogen, UA 0.2 0.2 or 1.0 E.U./dL   Nitrite, UA negative    Leukocytes, UA Large (3+) (A) Negative    Last CBC Lab Results  Component Value Date   WBC 5.2 11/29/2022   HGB 11.7 (L) 11/29/2022   HCT 35.4 (L) 11/29/2022   MCV 92.0 11/29/2022   MCH 30.7 01/05/2021   RDW 13.8 11/29/2022   PLT 146.0 (L) 11/29/2022   Last metabolic panel Lab Results  Component Value Date   GLUCOSE 94 11/29/2022   NA 135 11/29/2022   K 4.1 11/29/2022   CL 101 11/29/2022   CO2 27 11/29/2022   BUN 32 (H) 11/29/2022   CREATININE 1.04 11/29/2022   GFR 46.42 (L) 11/29/2022   CALCIUM 9.1 11/29/2022   PROT 6.6 11/29/2022   ALBUMIN 4.1 11/29/2022   BILITOT 0.8 11/29/2022   ALKPHOS 51 11/29/2022   AST 18 11/29/2022   ALT 10 11/29/2022   ANIONGAP 12 03/26/2020      The ASCVD Risk score (Arnett DK, et al., 2019) failed to calculate for the following reasons:   The 2019 ASCVD risk score is only valid for ages 36 to 56   The patient has a prior MI or stroke diagnosis  Assessment & Plan:  Acute cystitis without hematuria Assessment & Plan: Urinary Tract Infection Recurrent UTIs with dysuria starting this week. No hematuria or fever. Previous urine culture showed resistance to multiple antibiotics. Patient has been on Cipro, which may not be effective based on previous culture. Patient's hydration may be suboptimal. -UA in office positive for infection; urine sent out for culture and sensitivity. -Increase water intake to at least two 16-ounce bottles per day, with a goal of four. -single strength bactrim BID x 7 days. -AFTER completing 7 days of bactrim, start plain trimethoprim IN PLACE of cipro for prevention  Orders: -     POCT Urinalysis Dipstick (Automated) -  Urine Culture -     Sulfamethoxazole-Trimethoprim; Take 1 tablet by mouth 2 (two) times daily for 7 days.  Dispense: 14 tablet; Refill: 0  Sensorineural hearing loss (SNHL) of both  ears Assessment & Plan: Patient reports difficulty hearing, possibly related to hearing aid function. No signs of ear infection or wax impaction. -Recommend patient to follow up with audiologist for hearing aid assessment.   Recurrent UTI -     Trimethoprim; Take 1 tablet (100 mg total) by mouth daily.  Dispense: 90 tablet; Refill: 0     No follow-ups on file.   Maretta Bees, PA

## 2023-05-12 LAB — URINE CULTURE
MICRO NUMBER:: 15803946
SPECIMEN QUALITY:: ADEQUATE

## 2023-05-15 NOTE — Progress Notes (Signed)
Great thanks! I hope this helps prevent recurrence.

## 2023-05-20 ENCOUNTER — Other Ambulatory Visit: Payer: Self-pay | Admitting: Family Medicine

## 2023-05-24 ENCOUNTER — Ambulatory Visit: Payer: Medicare HMO | Admitting: Family Medicine

## 2023-05-24 ENCOUNTER — Encounter: Payer: Self-pay | Admitting: Family Medicine

## 2023-05-24 VITALS — BP 122/80 | HR 70 | Wt 122.6 lb

## 2023-05-24 DIAGNOSIS — Z79899 Other long term (current) drug therapy: Secondary | ICD-10-CM

## 2023-05-24 DIAGNOSIS — E7800 Pure hypercholesterolemia, unspecified: Secondary | ICD-10-CM | POA: Diagnosis not present

## 2023-05-24 DIAGNOSIS — I10 Essential (primary) hypertension: Secondary | ICD-10-CM | POA: Diagnosis not present

## 2023-05-24 DIAGNOSIS — N1831 Chronic kidney disease, stage 3a: Secondary | ICD-10-CM

## 2023-05-24 DIAGNOSIS — Z862 Personal history of diseases of the blood and blood-forming organs and certain disorders involving the immune mechanism: Secondary | ICD-10-CM

## 2023-05-24 DIAGNOSIS — D649 Anemia, unspecified: Secondary | ICD-10-CM

## 2023-05-24 LAB — BASIC METABOLIC PANEL
BUN: 31 mg/dL — ABNORMAL HIGH (ref 6–23)
CO2: 23 meq/L (ref 19–32)
Calcium: 8.6 mg/dL (ref 8.4–10.5)
Chloride: 104 meq/L (ref 96–112)
Creatinine, Ser: 1.43 mg/dL — ABNORMAL HIGH (ref 0.40–1.20)
GFR: 31.57 mL/min — ABNORMAL LOW (ref >60.00)
Glucose, Bld: 90 mg/dL (ref 70–99)
Potassium: 4.5 meq/L (ref 3.5–5.1)
Sodium: 135 meq/L (ref 135–145)

## 2023-05-24 LAB — TSH: TSH: 3.24 u[IU]/mL (ref 0.35–5.50)

## 2023-05-24 LAB — LIPID PANEL
Cholesterol: 126 mg/dL (ref 0–200)
HDL: 44 mg/dL (ref >39.00)
LDL Cholesterol: 70 mg/dL (ref 0–99)
NonHDL: 81.52
Total CHOL/HDL Ratio: 3
Triglycerides: 59 mg/dL (ref 0.0–149.0)
VLDL: 11.8 mg/dL (ref 0.0–40.0)

## 2023-05-24 LAB — CBC
HCT: 32.8 % — ABNORMAL LOW (ref 36.0–46.0)
Hemoglobin: 10.7 g/dL — ABNORMAL LOW (ref 12.0–15.0)
MCHC: 32.7 g/dL (ref 30.0–36.0)
MCV: 92.6 fL (ref 78.0–100.0)
Platelets: 139 10*3/uL — ABNORMAL LOW (ref 150.0–400.0)
RBC: 3.54 Mil/uL — ABNORMAL LOW (ref 3.87–5.11)
RDW: 15 % (ref 11.5–15.5)
WBC: 5.6 10*3/uL (ref 4.0–10.5)

## 2023-05-24 MED ORDER — OXYBUTYNIN CHLORIDE 5 MG PO TABS: 5.0000 mg | ORAL_TABLET | ORAL | 3 refills | Status: DC

## 2023-05-24 MED ORDER — IRBESARTAN 150 MG PO TABS: 150.0000 mg | ORAL_TABLET | Freq: Every day | ORAL | 30 refills | Status: DC

## 2023-05-24 NOTE — Progress Notes (Signed)
OFFICE VISIT  05/24/2023  CC:  Chief Complaint  Patient presents with   Medical Management of Chronic Issues    Pt is not fasting.     Patient is a 89 y.o. female who presents accompanied by her son for 48-month follow-up hypertension, chronic renal insufficiency, and recurrent UTI. A/P as of last visit: " 1 hypertension, well controlled on amlodipine 10 mg daily and irbesartan 150 mg daily. Electrolytes and creatinine today.   2.  Chronic renal insufficiency stage III. She avoids NSAIDs.   Electrolytes and creatinine today.   3. history of A-fib/flutter. She is doing well status post ablation in December 2021. Her cardiologist recently decreased her amiodarone to one half tab every other day. Checking TSH today. Her cardiologist has recently ordered a chest x-ray for her.   4. Hx of IDA from occult GI bleed 2021: Has seen GI, elected for no interventional/procedureal eval.  Continue oral iron supplement. CBC today.5. hyperlipidemia. Doing well on atorvastatin 40 mg a day. Lipid and hepatic panel today."  INTERIM HX: Sabrina Alvarez feels well. No urinary concerns at this time.  Denies palpitations, dizziness, shortness of breath, or chest pain. She is active in her home, is independent in all activities of daily living. She walks with a cane, sometimes even gets out in her yard.  ROS as above, plus--> no fevers,no HAs, no rashes, no melena/hematochezia.  No polyuria or polydipsia.  No myalgias or arthralgias.  No focal weakness, paresthesias, or tremors.  No acute vision or hearing abnormalities.  No dysuria or unusual/new urinary urgency or frequency.  No recent changes in lower legs. No n/v/d or abd pain.  No palpitations.      Past Medical History:  Diagnosis Date   Arthritis    "think it's osteo; got some in my hands"   Atrial fibrillation (HCC) 02/10/2020   Dr. Baltazar Apo, [employer] cards-[place],ASA and BB (stopped her ARB)-in sinus-monitor planned but pt hosp for acute R CVA,  eliquis started and pt remained on rate control   Bifascicular block    CAD (coronary artery disease)    Cath 2009  Occluded LAD, 40-50% RCA and circ managed medically.  Repeat 06/22/15 no change.   Chronic constipation    slow transit.  Barium enema to eval for colonic stricture 10/2014 was NORMAL   COPD (chronic obstructive pulmonary disease) (HCC)    Changes noted on CXR 2017   CVA (cerebral vascular accident) (HCC) 03/2020   L sided weakness; a-fib-->eliquis + rate control   Dementia (HCC)    GERD (gastroesophageal reflux disease)    (also LPR) Schatzki's ring, s/p dil '97; food impaction 08/2010, on PPI therapy since w/out further sx so no dil performed   History of herpes zoster 06/2015   right side of chest; presented with chest pain mimicking [place]--cardiac eval showed stable CAD compared to 2009.   Hyperkalemia 06/2017   suspected due to increase of losartan from 25mg  bid to 50mg  bid.  Dose lowered back to 25 mg bid K normalized.  After increase again to 50 qAM and 25 qPM potassium 4.8 so I did not increase dose any further (07/20/17).   Hyperlipidemia    statin started in hosp for cva 03/2020   Hypertension    permissive elevated bp ok   Hyponatremia 10/03/2011   Na 131 on labs 02/2015 at [facility].  Baseline Na 132.   Iron deficiency anemia 11/30/15; 09/2019   Occult GI bleed: 09/2019 Hb drop to 8.5, iron low, hemoccult +.  Oral iron started->Dr. Buccini eval->heme neg in his office->obs/follow Hb on oral Fe; order BE if Hb not responding or having ongoing heme+ stool. Then hosp for a-fib + CVA, eliquis started b/c no overt bleeding noted.   Mixed stress and urge urinary incontinence    RBBB with left anterior fascicular block    bifascicular block   Recurrent UTI    Bactrim prophyl, then changed to cefdinir (?med rxn?).  Cipro qd as of 08/2018.   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  ANA and sjogren's panel NEG 06/2017.    Past Surgical History:  Procedure Laterality Date   ABDOMINAL HYSTERECTOMY  2000   APPENDECTOMY  2000   ATRIAL FLUTTER ABLATION  06/03/2020   CARDIAC CATHETERIZATION N/A 06/22/2015   Stable single vessel CAD, EF normal.  Procedure: Left Heart Cath and Coronary Angiography;  Surgeon: Marykay Lex, MD;  Location: [facility] INVASIVE CV LAB;  Service: Cardiovascular;  Laterality: N/A;   CATARACT EXTRACTION W/ INTRAOCULAR LENS  IMPLANT, BILATERAL  1990's   CESAREAN SECTION  1959; 1960   COLONOSCOPY  04/2001   BE neg 10/2014   DILATION AND CURETTAGE OF UTERUS     ESOPHAGOGASTRODUODENOSCOPY  1997   for dysphagia->esoph dilatation done   HERNIA REPAIR     abdominal "twice"   LEFT HEART CATHETERIZATION WITH CORONARY ANGIOGRAM N/A 10/04/2011   Procedure: LEFT HEART CATHETERIZATION WITH CORONARY ANGIOGRAM;  Surgeon: Othella Boyer, MD;  Location: [facility] CATH LAB;  Service: Cardiovascular;  Laterality: N/A;   REPAIR KNEE LIGAMENT  ~ 2008   left   Rhythm monitoring  11/02/2020   48H holter->NSR w/1st deg AV block, occ PADs, rare PVCs, frequent sinus brady, lowest HR 47, no pauses.   TONSILLECTOMY     as a child   TRANSTHORACIC ECHOCARDIOGRAM  05/2013; 02/2020; 03/24/20   02/2020 (new dx a-fib) EF 60-65%, valves ok, no wall motion abnorm. 03/2020, pt in a-fib/flutter EF 55-60%, valves ok, small peric effus.     Outpatient Medications Prior to Visit  Medication Sig Dispense Refill   amiodarone (PACERONE) 200 MG tablet Take 200 mg by mouth daily. Take 1/2 pill daily by cardiologist     amLODipine (NORVASC) 10 MG tablet TAKE 1 TABLET BY MOUTH EVERY DAY 90 tablet 0   aspirin EC 81 MG tablet Take 81 mg by mouth daily. Swallow whole.     atorvastatin (LIPITOR) 40 MG tablet Take 1 tablet (40 mg  total) by mouth daily. 30 tablet 0   cevimeline (EVOXAC) 30 MG capsule TAKE 1 CAPSULE 3 TIMES A DAY FOR TREATMENT OF CHRONIC DRY MOUTH 270 capsule 1   Cholecalciferol (VITAMIN D3) 1000 UNITS CAPS Take 1,000 Units by mouth daily.     FERROUS SULFATE PO Take 1 tablet by mouth daily.      fluticasone (FLONASE) 50 MCG/ACT nasal spray SPRAY 2 SPRAYS INTO EACH NOSTRIL EVERY DAY 48 mL 0   nitroGLYCERIN (NITROSTAT) 0.4 MG SL tablet Place 1 tablet (0.4 mg total) under the tongue every 5 (five) minutes as needed for chest pain. 25 tablet 12   Omega-3 Fatty Acids (FISH OIL) 1000 MG CAPS Take 1 capsule by mouth daily.  pantoprazole (PROTONIX) 40 MG tablet Take 1 tablet (40 mg total) by mouth daily. PT DUE FOR APPT MAY 90 tablet 3   polyethylene glycol powder (GLYCOLAX/MIRALAX) powder TAKE 17 G BY MOUTH 2 (TWO) TIMES DAILY. (Patient taking differently: Take 17 g by mouth as needed.) 527 g 5   trimethoprim (TRIMPEX) 100 MG tablet Take 1 tablet (100 mg total) by mouth daily. 90 tablet 0   irbesartan (AVAPRO) 150 MG tablet TAKE 1 TABLET BY MOUTH EVERY DAY 30 tablet 0   oxybutynin (DITROPAN) 5 MG tablet TAKE 1 TABLET BY MOUTH EVERY OTHER DAY 45 tablet 2   No facility-administered medications prior to visit.    Allergies  Allergen Reactions   Cefdinir Hives   Macrobid [Nitrofurantoin Macrocrystal] Other (See Comments)    "I had chills & fever"    Review of Systems As per HPI  PE:    05/24/2023    8:45 AM 05/08/2023    2:05 PM 03/13/2023   11:06 AM  Vitals with BMI  Weight 122 lbs 10 oz 123 lbs 121 lbs  Systolic 122 120 213  Diastolic 80 77 60  Pulse 70 68 54     Physical Exam  Gen: Alert, well appearing.  Patient is oriented to person, place, time, and situation. AFFECT: pleasant, lucid thought and speech. CV: irreg irreg, soft systolic murmur LUNGS: CTA bilat EXT: no edema  LABS:  Last CBC Lab Results  Component Value Date   WBC 5.2 11/29/2022   HGB 11.7 (L) 11/29/2022   HCT  35.4 (L) 11/29/2022   MCV 92.0 11/29/2022   MCH 30.7 01/05/2021   RDW 13.8 11/29/2022   PLT 146.0 (L) 11/29/2022   Lab Results  Component Value Date   IRON 63 04/13/2022   TIBC 200 (L) 04/13/2022   FERRITIN 124 04/13/2022   Last metabolic panel Lab Results  Component Value Date   GLUCOSE 94 11/29/2022   NA 135 11/29/2022   K 4.1 11/29/2022   CL 101 11/29/2022   CO2 27 11/29/2022   BUN 32 (H) 11/29/2022   CREATININE 1.04 11/29/2022   GFR 46.42 (L) 11/29/2022   CALCIUM 9.1 11/29/2022   PROT 6.6 11/29/2022   ALBUMIN 4.1 11/29/2022   BILITOT 0.8 11/29/2022   ALKPHOS 51 11/29/2022   AST 18 11/29/2022   ALT 10 11/29/2022   ANIONGAP 12 03/26/2020   Last lipids Lab Results  Component Value Date   CHOL 146 11/29/2022   HDL 50.50 11/29/2022   LDLCALC 84 11/29/2022   TRIG 58.0 11/29/2022   CHOLHDL 3 11/29/2022   Last hemoglobin A1c Lab Results  Component Value Date   HGBA1C 5.6 03/24/2020   Last thyroid functions Lab Results  Component Value Date   TSH 2.21 11/29/2022   T3TOTAL 78.4 (L) 05/10/2015   IMPRESSION AND PLAN:  #1 hypertension, well-controlled on amlodipine 10 mg a day and a irbesartan 150 mg a day. Electrolytes and creatinine monitoring today.  2.  Hypercholesterolemia, doing well on a atorvastatin 40 mg a day. Lipid panel today.  3.  Recurrent UTI, doing well status post relatively recent UTI.  She has been switched over from Cipro to trimethoprim for daily prophylaxis and is tolerating well.  #4 Chronic renal insufficiency stage III. She avoids NSAIDs.   Electrolytes and creatinine today.  #5 history of A-fib/flutter. She is doing well status post ablation in December 2021. Continue amiodarone to one half tab every other day.  Aspirin and not DOAC due to high fall  risk. Checking TSH today.  #6 Hx of IDA from occult GI bleed 2021: Has seen GI, elected for no interventional/procedureal eval.  Continue oral iron supplement. CBC today.  An After  Visit Summary was printed and given to the patient.  FOLLOW UP: Return in about 6 months (around 11/22/2023) for routine chronic illness f/u.  Signed:  Santiago Bumpers, MD           05/24/2023

## 2023-06-02 DIAGNOSIS — R69 Illness, unspecified: Secondary | ICD-10-CM | POA: Diagnosis not present

## 2023-06-04 NOTE — Addendum Note (Signed)
Addended by: Filomena Jungling on: 06/04/2023 04:31 PM   Modules accepted: Orders

## 2023-06-11 ENCOUNTER — Other Ambulatory Visit: Payer: Medicare HMO

## 2023-06-21 ENCOUNTER — Other Ambulatory Visit: Payer: Self-pay

## 2023-06-21 ENCOUNTER — Telehealth: Payer: Self-pay | Admitting: Family Medicine

## 2023-06-21 MED ORDER — CEVIMELINE HCL 30 MG PO CAPS: 30.0000 mg | ORAL_CAPSULE | Freq: Every day | ORAL | 3 refills | Status: DC

## 2023-06-21 NOTE — Telephone Encounter (Signed)
Rx sent 

## 2023-06-21 NOTE — Telephone Encounter (Signed)
Yes okay to do new prescription with instructions saying take 1 daily, #90, refill x 3.

## 2023-06-21 NOTE — Telephone Encounter (Signed)
Copied from CRM [id number]. Topic: Clinical - Prescription Issue >> Jun 21, 2023  9:06 AM Adele Barthel wrote: Reason for CRM: Patient's son is calling concerning cevimeline (EVOXAC) 30 MG capsule, she typically receives 270 tablets. Son would like either 60 or 90 tablets instead, due to dosage change of 1 time a day instead of 3 times a day. CB# [phone], Joey Forstner. She has enough medication to get through the week.

## 2023-06-21 NOTE — Telephone Encounter (Signed)
Please advise if out to send new Rx for evoxac can be sent. Pt currently taking TID and would like to take once a day.

## 2023-07-04 ENCOUNTER — Ambulatory Visit: Payer: Medicare HMO | Admitting: *Deleted

## 2023-07-04 DIAGNOSIS — Z0000 Encounter for general adult medical examination without abnormal findings: Secondary | ICD-10-CM

## 2023-07-04 NOTE — Progress Notes (Signed)
Subjective:   Sabrina Alvarez is a 89 y.o. female who presents for Medicare Annual (Subsequent) preventive examination.  Visit Complete: Virtual I connected with  Gar Gibbon on 07/04/23 by a audio enabled telemedicine application and verified that I am speaking with the correct person using two identifiers.  Patient Location: Home  Provider Location: Home Office  I discussed the limitations of evaluation and management by telemedicine. The patient expressed understanding and agreed to proceed.  Vital Signs: Because this visit was a virtual/telehealth visit, some criteria may be missing or patient reported. Any vitals not documented were not able to be obtained and vitals that have been documented are patient reported.    Cardiac Risk Factors include: advanced age (>63men, >36 women)     Objective:    There were no vitals filed for this visit. There is no height or weight on file to calculate BMI.     07/04/2023    2:08 PM 06/28/2022    1:47 PM 06/22/2021    2:56 PM 01/10/2021    6:13 PM 03/23/2020   10:18 PM 04/19/2018    2:19 PM 06/29/2017    2:45 PM  Advanced Directives  Does Patient Have a Medical Advance Directive? Yes Yes Yes No Yes Yes Yes  Type of Estate agent of [employer] Power of [place];Living will Healthcare Power of [place]  Living will Healthcare Power of [place];Living will Healthcare Power of [place];Living will  Does patient want to make changes to medical advance directive?     No - Patient declined    Copy of Healthcare Power of Attorney in Chart? Yes - validated most recent copy scanned in chart (See row information) Yes - validated most recent copy scanned in chart (See row information) Yes - validated most recent copy scanned in chart (See row information)    No - copy requested  Would patient like information on creating a medical advance directive?     No - Patient declined      Current Medications  (verified) Outpatient Encounter Medications as of 07/04/2023  Medication Sig   amiodarone (PACERONE) 200 MG tablet Take 200 mg by mouth daily. Take 1/2 pill daily by cardiologist   amLODipine (NORVASC) 10 MG tablet TAKE 1 TABLET BY MOUTH EVERY DAY   aspirin EC 81 MG tablet Take 81 mg by mouth daily. Swallow whole.   atorvastatin (LIPITOR) 40 MG tablet Take 1 tablet (40 mg total) by mouth daily.   cevimeline (EVOXAC) 30 MG capsule Take 1 capsule (30 mg total) by mouth daily.   Cholecalciferol (VITAMIN D3) 1000 UNITS CAPS Take 1,000 Units by mouth daily.   FERROUS SULFATE PO Take 1 tablet by mouth daily.    fluticasone (FLONASE) 50 MCG/ACT nasal spray SPRAY 2 SPRAYS INTO EACH NOSTRIL EVERY DAY   irbesartan (AVAPRO) 150 MG tablet Take 1 tablet (150 mg total) by mouth daily.   nitroGLYCERIN (NITROSTAT) 0.4 MG SL tablet Place 1 tablet (0.4 mg total) under the tongue every 5 (five) minutes as needed for chest pain.   Omega-3 Fatty Acids (FISH OIL) 1000 MG CAPS Take 1 capsule by mouth daily.   oxybutynin (DITROPAN) 5 MG tablet Take 1 tablet (5 mg total) by mouth every other day.   pantoprazole (PROTONIX) 40 MG tablet Take 1 tablet (40 mg total) by mouth daily. PT DUE FOR APPT MAY   polyethylene glycol powder (GLYCOLAX/MIRALAX) powder TAKE 17 G BY MOUTH 2 (TWO) TIMES DAILY. (Patient taking differently: Take 17  g by mouth as needed.)   trimethoprim (TRIMPEX) 100 MG tablet Take 1 tablet (100 mg total) by mouth daily.   No facility-administered encounter medications on file as of 07/04/2023.    Allergies (verified) Cefdinir and Macrobid [nitrofurantoin macrocrystal]   History: Past Medical History:  Diagnosis Date   Arthritis    "think it's osteo; got some in my hands"   Atrial fibrillation (HCC) 02/10/2020   Dr. Baltazar Apo, [employer] cards-[place],ASA and BB (stopped her ARB)-in sinus-monitor planned but pt hosp for acute R CVA, eliquis started and pt remained on rate control   Bifascicular block     CAD (coronary artery disease)    Cath 2009  Occluded LAD, 40-50% RCA and circ managed medically.  Repeat 06/22/15 no change.   Chronic constipation    slow transit.  Barium enema to eval for colonic stricture 10/2014 was NORMAL   COPD (chronic obstructive pulmonary disease) (HCC)    Changes noted on CXR 2017   CVA (cerebral vascular accident) (HCC) 03/2020   L sided weakness; a-fib-->eliquis + rate control   Dementia (HCC)    GERD (gastroesophageal reflux disease)    (also LPR) Schatzki's ring, s/p dil '97; food impaction 08/2010, on PPI therapy since w/out further sx so no dil performed   History of herpes zoster 06/2015   right side of chest; presented with chest pain mimicking [place]--cardiac eval showed stable CAD compared to 2009.   Hyperkalemia 06/2017   suspected due to increase of losartan from 25mg  bid to 50mg  bid.  Dose lowered back to 25 mg bid K normalized.  After increase again to 50 qAM and 25 qPM potassium 4.8 so I did not increase dose any further (07/20/17).   Hyperlipidemia    statin started in hosp for cva 03/2020   Hypertension    permissive elevated bp ok   Hyponatremia 10/03/2011   Na 131 on labs 02/2015 at [facility].  Baseline Na 132.   Iron deficiency anemia 11/30/15; 09/2019   Occult GI bleed: 09/2019 Hb drop to 8.5, iron low, hemoccult +.  Oral iron started->Dr. Buccini eval->heme neg in his office->obs/follow Hb on oral Fe; order BE if Hb not responding or having ongoing heme+ stool. Then hosp for a-fib + CVA, eliquis started b/c no overt bleeding noted.   Mixed stress and urge urinary incontinence    RBBB with left anterior fascicular block    bifascicular block   Recurrent UTI    Bactrim prophyl, then changed to cefdinir (?med rxn?).  Cipro qd as of 08/2018.   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  ANA and sjogren's panel NEG 06/2017.   Past Surgical History:  Procedure Laterality Date   ABDOMINAL HYSTERECTOMY  2000   APPENDECTOMY  2000   ATRIAL FLUTTER ABLATION  06/03/2020   CARDIAC CATHETERIZATION N/A 06/22/2015   Stable single vessel CAD, EF normal.  Procedure: Left Heart Cath and Coronary Angiography;  Surgeon: Marykay Lex, MD;  Location: [facility] INVASIVE CV LAB;  Service: Cardiovascular;  Laterality: N/A;   CATARACT EXTRACTION W/ INTRAOCULAR LENS  IMPLANT, BILATERAL  1990's   CESAREAN SECTION  1959; 1960   COLONOSCOPY  04/2001   BE neg 10/2014  DILATION AND CURETTAGE OF UTERUS     ESOPHAGOGASTRODUODENOSCOPY  1997   for dysphagia->esoph dilatation done   HERNIA REPAIR     abdominal "twice"   LEFT HEART CATHETERIZATION WITH CORONARY ANGIOGRAM N/A 10/04/2011   Procedure: LEFT HEART CATHETERIZATION WITH CORONARY ANGIOGRAM;  Surgeon: Othella Boyer, MD;  Location: [facility] CATH LAB;  Service: Cardiovascular;  Laterality: N/A;   REPAIR KNEE LIGAMENT  ~ 2008   left   Rhythm monitoring  11/02/2020   48H holter->NSR w/1st deg AV block, occ PADs, rare PVCs, frequent sinus brady, lowest HR 47, no pauses.   TONSILLECTOMY     as a child   TRANSTHORACIC ECHOCARDIOGRAM  05/2013; 02/2020; 03/24/20   02/2020 (new dx a-fib) EF 60-65%, valves ok, no wall motion abnorm. 03/2020, pt in a-fib/flutter EF 55-60%, valves ok, small peric effus.    Family History  Problem Relation Age of Onset   Arthritis Mother    AAA (abdominal aortic aneurysm) Mother    Diabetes Sister    CVA Father    Rheum arthritis Sister    Cancer Neg Hx    Heart disease Neg Hx    Thyroid disease Neg Hx    Social History   Socioeconomic History   Marital status: Widowed    Spouse name: Not on file   Number of children: 3   Years of education: Not on file   Highest education level: Not on file  Occupational History    Not on file  Tobacco Use   Smoking status: Never   Smokeless tobacco: Never  Vaping Use   Vaping status: Never Used  Substance and Sexual Activity   Alcohol use: No   Drug use: No   Sexual activity: Not Currently  Other Topics Concern   Not on file  Social History Narrative   Widow, lives alone.  Three sons, one deceased.   Orig from [facility].     Educ: HS.   Occup: retired.  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   No T/A/Ds.   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   Social Drivers of Corporate investment banker Strain: Low Risk  (07/04/2023)   Overall Financial Resource Strain (CARDIA)    Difficulty of Paying Living Expenses: Not hard at all  Food Insecurity: No Food Insecurity (07/04/2023)   Hunger Vital Sign    Worried About Running Out of Food in the Last Year: Never true    Ran Out of Food in the Last Year: Never true  Transportation Needs: No Transportation Needs (07/04/2023)   PRAPARE - Administrator, Civil Service (Medical): No    Lack of Transportation (Non-Medical): No  Physical Activity: Inactive (07/04/2023)   Exercise Vital Sign    Days of Exercise per Week: 0 days    Minutes of Exercise per Session: 0 min  Stress: No Stress Concern Present (07/04/2023)   [employer] of Occupational Health - Occupational Stress Questionnaire    Feeling of Stress : Not at all  Social Connections: Socially Isolated (07/04/2023)   Social Connection and Isolation Panel [NHANES]    Frequency of Communication with Friends and Family: Once a week    Frequency of Social Gatherings with Friends and Family: Twice a week    Attends Religious Services: Never    Database administrator or Organizations: No    Attends Banker Meetings: Never    Marital Status: Widowed    Tobacco Counseling Counseling given: Not  Answered   Clinical Intake:  Pre-visit preparation completed: Yes  Pain : No/denies pain     Diabetes: No  How  often do you need to have someone help you when you read instructions, pamphlets, or other written materials from your doctor or pharmacy?: 1 - Never  Interpreter Needed?: No  Information entered by :: Remi Haggard LPN   Activities of Daily Living    07/04/2023    2:09 PM  In your present state of health, do you have any difficulty performing the following activities:  Hearing? 1  Vision? 0  Difficulty concentrating or making decisions? 0  Walking or climbing stairs? 1  Dressing or bathing? 0  Doing errands, shopping? 1  Preparing Food and eating ? N  Using the Toilet? N  In the past six months, have you accidently leaked urine? Y  Do you have problems with loss of bowel control? N  Managing your Medications? N  Managing your Finances? Y  Housekeeping or managing your Housekeeping? Y    Patient Care Team: Jeoffrey Massed, MD as PCP - General (Family Medicine) Othella Boyer, MD as Consulting Physician (Cardiology) Lomax, Billey Chang, MD (Inactive) as Consulting Physician (Gynecology) Bernette Redbird, MD as Consulting Physician (Gastroenterology) Alfredo Martinez, MD as Consulting Physician (Urology) Bufford Buttner, MD as Referring Physician (Dermatology)  Indicate any recent Medical Services you may have received from other than [facility] providers in the past year (date may be approximate).     Assessment:   This is a routine wellness examination for [facility].  Hearing/Vision screen Hearing Screening - Comments:: Yes wears hearing aids bilateral Vision Screening - Comments:: Not up to date   Goals Addressed   None    Depression Screen    07/04/2023    2:07 PM 11/22/2022   11:05 AM 06/28/2022    1:56 PM 06/22/2021    2:54 PM 05/09/2021    2:02 PM 01/10/2021    5:26 PM 01/30/2020    4:22 PM  PHQ 2/9 Scores  PHQ - 2 Score 0 1 0 0 0 0 0  PHQ- 9 Score 0 2         Fall Risk    07/04/2023    2:09 PM 11/22/2022   11:05 AM 06/28/2022    1:56 PM 06/22/2021    2:58 PM  05/09/2021    2:01 PM  Fall Risk   Falls in the past year? 0 0 0 0 0  Number falls in past yr: 0 0 0 0 0  Injury with Fall? 0 0 0 0 0  Risk for fall due to : Impaired balance/gait Impaired balance/gait No Fall Risks Impaired balance/gait;Impaired vision   Follow up Falls evaluation completed;Education provided;Falls prevention discussed Falls evaluation completed Falls evaluation completed Falls prevention discussed Falls evaluation completed    MEDICARE RISK AT HOME: Medicare Risk at Home Any stairs in or around the home?: No If so, are there any without handrails?: No Home free of loose throw rugs in walkways, pet beds, electrical cords, etc?: Yes Adequate lighting in your home to reduce risk of falls?: Yes Life alert?: No Use of a cane, walker or w/c?: Yes Grab bars in the bathroom?: Yes Shower chair or bench in shower?: Yes Elevated toilet seat or a handicapped toilet?: No  TIMED UP AND GO:  Was the test performed?  No    Cognitive Function:    06/29/2017    2:47 PM  MMSE - Mini Mental State Exam  Orientation  to time 5  Orientation to Place 5  Registration 3  Attention/ Calculation 5  Recall 1  Language- name 2 objects 2  Language- repeat 1  Language- follow 3 step command 3  Language- read & follow direction 1  Write a sentence 1  Copy design 1  Total score 28        07/04/2023    2:02 PM 06/28/2022    1:57 PM 06/22/2021    3:03 PM  6CIT Screen  What Year? 0 points 0 points 4 points  What month? 0 points 0 points 3 points  What time? 0 points 0 points 0 points  Count back from 20  2 points 0 points  Months in reverse 2 points 0 points 4 points  Repeat phrase 2 points 10 points 8 points  Total Score  12 points 19 points    Immunizations Immunization History  Administered Date(s) Administered   Fluad Quad(high Dose 65+) 03/06/2019, 03/26/2020, 03/29/2021, 03/11/2022   Fluad Trivalent(High Dose 65+) 02/19/2023   Influenza Split 04/05/2006, 02/04/2008,  03/30/2009, 03/27/2011   Influenza, High Dose Seasonal PF 02/21/2010, 02/05/2014, 04/15/2015, 02/24/2016, 04/05/2018   Influenza-Unspecified 03/25/2012, 03/27/2013, 04/15/2015, 02/20/2017   PFIZER(Purple Top)SARS-COV-2 Vaccination 07/01/2019, 07/22/2019, 04/09/2020, 01/05/2021   Pfizer(Comirnaty)Fall Seasonal Vaccine 12 years and older 03/11/2022, 02/19/2023   Pneumococcal Conjugate-13 04/15/2015   Pneumococcal Polysaccharide-23 04/15/1999, 03/30/2009, 07/20/2016   Tdap 04/19/2018   Zoster Recombinant(Shingrix) 07/19/2017   Zoster, Live 04/05/2006    TDAP status: Up to date  Flu Vaccine status: Up to date  Pneumococcal vaccine status: Up to date  Covid-19 vaccine status: Information provided on how to obtain vaccines.   Qualifies for Shingles Vaccine? Yes   Zostavax completed No   Shingrix Completed?: No.    Education has been provided regarding the importance of this vaccine. Patient has been advised to call insurance company to determine out of pocket expense if they have not yet received this vaccine. Advised may also receive vaccine at local pharmacy or Health Dept. Verbalized acceptance and understanding.  Screening Tests Health Maintenance  Topic Date Due   DEXA SCAN  Never done   Zoster Vaccines- Shingrix (2 of 2) 09/13/2017   COVID-19 Vaccine (7 - 2024-25 season) 04/16/2023   Medicare Annual Wellness (AWV)  07/03/2024   DTaP/Tdap/Td (2 - Td or Tdap) 04/19/2028   Pneumonia Vaccine 64+ Years old  Completed   INFLUENZA VACCINE  Completed   HPV VACCINES  Aged Out    Health Maintenance  Health Maintenance Due  Topic Date Due   DEXA SCAN  Never done   Zoster Vaccines- Shingrix (2 of 2) 09/13/2017   COVID-19 Vaccine (7 - 2024-25 season) 04/16/2023    Colorectal cancer screening: No longer required.   Mammogram status: No longer required due to  .  Bone Density   Never done  Lung Cancer Screening: (Low Dose CT Chest recommended if Age 59-80 years, 20 pack-year  currently smoking OR have quit w/in 15years.) does not qualify.   Lung Cancer Screening Referral:   Additional Screening:  Hepatitis C Screening: does qualify;  Vision Screening: Recommended annual ophthalmology exams for early detection of glaucoma and other disorders of the eye. Is the patient up to date with their annual eye exam?  No  Who is the provider or what is the name of the office in which the patient attends annual eye exams?  If pt is not established with a provider, would they like to be referred to a provider to establish  care? No .   Dental Screening: Recommended annual dental exams for proper oral hygiene    Community Resource Referral / Chronic Care Management: CRR required this visit?  No   CCM required this visit?  No     Plan:     I have personally reviewed and noted the following in the patient's chart:   Medical and social history Use of alcohol, tobacco or illicit drugs  Current medications and supplements including opioid prescriptions. Patient is not currently taking opioid prescriptions. Functional ability and status Nutritional status Physical activity Advanced directives List of other physicians Hospitalizations, surgeries, and ER visits in previous 12 months Vitals Screenings to include cognitive, depression, and falls Referrals and appointments  In addition, I have reviewed and discussed with patient certain preventive protocols, quality metrics, and best practice recommendations. A written personalized care plan for preventive services as well as general preventive health recommendations were provided to patient.     Remi Haggard, LPN   02/17/7828   After Visit Summary: (MyChart) Due to this being a telephonic visit, the after visit summary with patients personalized plan was offered to patient via MyChart   Nurse Notes:

## 2023-07-04 NOTE — Patient Instructions (Signed)
Sabrina Alvarez , Thank you for taking time to come for your Medicare Wellness Visit. I appreciate your ongoing commitment to your health goals. Please review the following plan we discussed and let me know if I can assist you in the future.   Screening recommendations/referrals: Colonoscopy: no longer required Mammogram: no longer required Bone Density: never done Recommended yearly ophthalmology/optometry visit for glaucoma screening and checkup Recommended yearly dental visit for hygiene and checkup  Vaccinations: Influenza vaccine: up to date Pneumococcal vaccine: up to date Tdap vaccine: up to date Shingles vaccine:       Preventive Care 65 Years and Older, Female Preventive care refers to lifestyle choices and visits with your health care provider that can promote health and wellness. What does preventive care include? A yearly physical exam. This is also called an annual well check. Dental exams once or twice a year. Routine eye exams. Ask your health care provider how often you should have your eyes checked. Personal lifestyle choices, including: Daily care of your teeth and gums. Regular physical activity. Eating a healthy diet. Avoiding tobacco and drug use. Limiting alcohol use. Practicing safe sex. Taking low-dose aspirin every day. Taking vitamin and mineral supplements as recommended by your health care provider. What happens during an annual well check? The services and screenings done by your health care provider during your annual well check will depend on your age, overall health, lifestyle risk factors, and family history of disease. Counseling  Your health care provider may ask you questions about your: Alcohol use. Tobacco use. Drug use. Emotional well-being. Home and relationship well-being. Sexual activity. Eating habits. History of falls. Memory and ability to understand (cognition). Work and work Astronomer. Reproductive health. Screening  You  may have the following tests or measurements: Height, weight, and BMI. Blood pressure. Lipid and cholesterol levels. These may be checked every 5 years, or more frequently if you are over 27 years old. Skin check. Lung cancer screening. You may have this screening every year starting at age 7 if you have a 30-pack-year history of smoking and currently smoke or have quit within the past 15 years. Fecal occult blood test (FOBT) of the stool. You may have this test every year starting at age 52. Flexible sigmoidoscopy or colonoscopy. You may have a sigmoidoscopy every 5 years or a colonoscopy every 10 years starting at age 74. Hepatitis C blood test. Hepatitis B blood test. Sexually transmitted disease (STD) testing. Diabetes screening. This is done by checking your blood sugar (glucose) after you have not eaten for a while (fasting). You may have this done every 1-3 years. Bone density scan. This is done to screen for osteoporosis. You may have this done starting at age 45. Mammogram. This may be done every 1-2 years. Talk to your health care provider about how often you should have regular mammograms. Talk with your health care provider about your test results, treatment options, and if necessary, the need for more tests. Vaccines  Your health care provider may recommend certain vaccines, such as: Influenza vaccine. This is recommended every year. Tetanus, diphtheria, and acellular pertussis (Tdap, Td) vaccine. You may need a Td booster every 10 years. Zoster vaccine. You may need this after age 67. Pneumococcal 13-valent conjugate (PCV13) vaccine. One dose is recommended after age 31. Pneumococcal polysaccharide (PPSV23) vaccine. One dose is recommended after age 39. Talk to your health care provider about which screenings and vaccines you need and how often you need them. This information is not  intended to replace advice given to you by your health care provider. Make sure you discuss any  questions you have with your health care provider. Document Released: 06/18/2015 Document Revised: 02/09/2016 Document Reviewed: 03/23/2015 Elsevier Interactive Patient Education  2017 [employer].  Fall Prevention in the Home Falls can cause injuries. They can happen to people of all ages. There are many things you can do to make your home safe and to help prevent falls. What can I do on the outside of my home? Regularly fix the edges of walkways and driveways and fix any cracks. Remove anything that might make you trip as you walk through a door, such as a raised step or threshold. Trim any bushes or trees on the path to your home. Use bright outdoor lighting. Clear any walking paths of anything that might make someone trip, such as rocks or tools. Regularly check to see if handrails are loose or broken. Make sure that both sides of any steps have handrails. Any raised decks and porches should have guardrails on the edges. Have any leaves, snow, or ice cleared regularly. Use sand or salt on walking paths during winter. Clean up any spills in your garage right away. This includes oil or grease spills. What can I do in the bathroom? Use night lights. Install grab bars by the toilet and in the tub and shower. Do not use towel bars as grab bars. Use non-skid mats or decals in the tub or shower. If you need to sit down in the shower, use a plastic, non-slip stool. Keep the floor dry. Clean up any water that spills on the floor as soon as it happens. Remove soap buildup in the tub or shower regularly. Attach bath mats securely with double-sided non-slip rug tape. Do not have throw rugs and other things on the floor that can make you trip. What can I do in the bedroom? Use night lights. Make sure that you have a light by your bed that is easy to reach. Do not use any sheets or blankets that are too big for your bed. They should not hang down onto the floor. Have a firm chair that has side  arms. You can use this for support while you get dressed. Do not have throw rugs and other things on the floor that can make you trip. What can I do in the kitchen? Clean up any spills right away. Avoid walking on wet floors. Keep items that you use a lot in easy-to-reach places. If you need to reach something above you, use a strong step stool that has a grab bar. Keep electrical cords out of the way. Do not use floor polish or wax that makes floors slippery. If you must use wax, use non-skid floor wax. Do not have throw rugs and other things on the floor that can make you trip. What can I do with my stairs? Do not leave any items on the stairs. Make sure that there are handrails on both sides of the stairs and use them. Fix handrails that are broken or loose. Make sure that handrails are as long as the stairways. Check any carpeting to make sure that it is firmly attached to the stairs. Fix any carpet that is loose or worn. Avoid having throw rugs at the top or bottom of the stairs. If you do have throw rugs, attach them to the floor with carpet tape. Make sure that you have a light switch at the top of the stairs  and the bottom of the stairs. If you do not have them, ask someone to add them for you. What else can I do to help prevent falls? Wear shoes that: Do not have high heels. Have rubber bottoms. Are comfortable and fit you well. Are closed at the toe. Do not wear sandals. If you use a stepladder: Make sure that it is fully opened. Do not climb a closed stepladder. Make sure that both sides of the stepladder are locked into place. Ask someone to hold it for you, if possible. Clearly mark and make sure that you can see: Any grab bars or handrails. First and last steps. Where the edge of each step is. Use tools that help you move around (mobility aids) if they are needed. These include: Canes. Walkers. Scooters. Crutches. Turn on the lights when you go into a dark area.  Replace any light bulbs as soon as they burn out. Set up your furniture so you have a clear path. Avoid moving your furniture around. If any of your floors are uneven, fix them. If there are any pets around you, be aware of where they are. Review your medicines with your doctor. Some medicines can make you feel dizzy. This can increase your chance of falling. Ask your doctor what other things that you can do to help prevent falls. This information is not intended to replace advice given to you by your health care provider. Make sure you discuss any questions you have with your health care provider. Document Released: 03/18/2009 Document Revised: 10/28/2015 Document Reviewed: 06/26/2014 Elsevier Interactive Patient Education  2017 [employer].

## 2023-07-28 ENCOUNTER — Other Ambulatory Visit: Payer: Self-pay | Admitting: Family Medicine

## 2023-08-22 ENCOUNTER — Telehealth: Payer: Self-pay

## 2023-08-22 DIAGNOSIS — J9811 Atelectasis: Secondary | ICD-10-CM | POA: Diagnosis not present

## 2023-08-22 DIAGNOSIS — I10 Essential (primary) hypertension: Secondary | ICD-10-CM | POA: Diagnosis not present

## 2023-08-22 DIAGNOSIS — Z888 Allergy status to other drugs, medicaments and biological substances status: Secondary | ICD-10-CM | POA: Diagnosis not present

## 2023-08-22 DIAGNOSIS — Z79899 Other long term (current) drug therapy: Secondary | ICD-10-CM | POA: Diagnosis not present

## 2023-08-22 DIAGNOSIS — Z881 Allergy status to other antibiotic agents status: Secondary | ICD-10-CM | POA: Diagnosis not present

## 2023-08-22 DIAGNOSIS — R9431 Abnormal electrocardiogram [ECG] [EKG]: Secondary | ICD-10-CM | POA: Diagnosis not present

## 2023-08-22 DIAGNOSIS — R7989 Other specified abnormal findings of blood chemistry: Secondary | ICD-10-CM | POA: Diagnosis not present

## 2023-08-22 DIAGNOSIS — I2510 Atherosclerotic heart disease of native coronary artery without angina pectoris: Secondary | ICD-10-CM | POA: Diagnosis not present

## 2023-08-22 DIAGNOSIS — K219 Gastro-esophageal reflux disease without esophagitis: Secondary | ICD-10-CM | POA: Diagnosis not present

## 2023-08-22 DIAGNOSIS — R0789 Other chest pain: Secondary | ICD-10-CM | POA: Diagnosis not present

## 2023-08-22 DIAGNOSIS — Z8551 Personal history of malignant neoplasm of bladder: Secondary | ICD-10-CM | POA: Diagnosis not present

## 2023-08-22 DIAGNOSIS — R0602 Shortness of breath: Secondary | ICD-10-CM | POA: Diagnosis not present

## 2023-08-22 DIAGNOSIS — I4510 Unspecified right bundle-branch block: Secondary | ICD-10-CM | POA: Diagnosis not present

## 2023-08-22 DIAGNOSIS — Z8673 Personal history of transient ischemic attack (TIA), and cerebral infarction without residual deficits: Secondary | ICD-10-CM | POA: Diagnosis not present

## 2023-08-22 NOTE — Telephone Encounter (Signed)
 Copied from CRM [id number]. Topic: Clinical - Medical Advice >> Aug 22, 2023  1:12 PM Denese Killings wrote: Reason for CRM: Patient son had to take mom to the hospital for chest pains. They ran tests, EKG, Chest xrays, COVID, CBC, CMP. They stated that her heart was ok and test came back negative and to contact doctor with an update.

## 2023-08-23 ENCOUNTER — Telehealth: Payer: Self-pay

## 2023-08-23 NOTE — Transitions of Care (Post Inpatient/ED Visit) (Signed)
 08/23/2023  Name: Sabrina Alvarez MRN: [id number] DOB: 07-28-1927  Today's TOC FU Call Status: Today's TOC FU Call Status:: Successful TOC FU Call Completed TOC FU Call Complete Date: 08/23/23 Patient's Name and Date of Birth confirmed.  Transition Care Management Follow-up Telephone Call Date of Discharge: 08/22/23 Discharge Facility: Other ([facility]) Name of Other ([facility]) Discharge Facility: [facility] Type of Discharge: Emergency Department Reason for ED Visit: Other: (other chest pain) How have you been since you were released from the hospital?: Better Any questions or concerns?: No  Items Reviewed: Did you receive and understand the discharge instructions provided?: Yes Medications obtained,verified, and reconciled?: Yes (Medications Reviewed) Any new allergies since your discharge?: No Dietary orders reviewed?: Yes Do you have support at home?: Yes People in Home: child(ren), adult  Medications Reviewed Today: Medications Reviewed Today     Reviewed by Karena Addison, LPN (Licensed Practical Nurse) on 08/23/23 at 1414  Med List Status: <None>   Medication Order Taking? Sig Documenting Provider Last Dose Status Informant  amiodarone (PACERONE) 200 MG tablet [id number] No Take 200 mg by mouth daily. Take 1/2 pill daily by cardiologist [provider] Taking Active            Med Note Leonie Douglas   Wed Nov 22, 2022 11:01 AM) Takes Monday, Wed and Fri.  amLODipine (NORVASC) 10 MG tablet [id number]  TAKE 1 TABLET BY MOUTH EVERY DAY McGowen, Maryjean Morn, MD  Active   aspirin EC 81 MG tablet [id number] No Take 81 mg by mouth daily. Swallow whole. [provider] Taking Active   atorvastatin (LIPITOR) 40 MG tablet [id number] No Take 1 tablet (40 mg total) by mouth daily. Maretta Bees, MD Taking Active   cevimeline [facility]) 30 MG capsule [id number] No Take 1 capsule (30 mg total) by mouth daily. Jeoffrey Massed, MD Taking Active    Cholecalciferol (VITAMIN D3) 1000 UNITS CAPS [id number] No Take 1,000 Units by mouth daily. [provider] Taking Active   FERROUS SULFATE PO [id number] No Take 1 tablet by mouth daily.  [provider] Taking Active Child  fluticasone (FLONASE) 50 MCG/ACT nasal spray [id number] No SPRAY 2 SPRAYS INTO EACH NOSTRIL EVERY DAY McGowen, Maryjean Morn, MD Taking Active   irbesartan (AVAPRO) 150 MG tablet [id number] No Take 1 tablet (150 mg total) by mouth daily. McGowen, Maryjean Morn, MD Taking Active   nitroGLYCERIN (NITROSTAT) 0.4 MG SL tablet [id number] No Place 1 tablet (0.4 mg total) under the tongue every 5 (five) minutes as needed for chest pain. Othella Boyer, MD Taking Active   Omega-3 Fatty Acids (FISH OIL) 1000 MG CAPS [id number] No Take 1 capsule by mouth daily. [provider] Taking Active Child  oxybutynin (DITROPAN) 5 MG tablet [id number] No Take 1 tablet (5 mg total) by mouth every other day. Jeoffrey Massed, MD Taking Active   pantoprazole (PROTONIX) 40 MG tablet [id number] No Take 1 tablet (40 mg total) by mouth daily. PT DUE FOR APPT MAY McGowen, Maryjean Morn, MD Taking Active   polyethylene glycol powder (GLYCOLAX/MIRALAX) powder [id number] No TAKE 17 G BY MOUTH 2 (TWO) TIMES DAILY.  Patient taking differently: Take 17 g by mouth as needed.   Jeoffrey Massed, MD Taking Active Child  trimethoprim (TRIMPEX) 100 MG tablet [id number] No Take 1 tablet (100 mg total) by mouth daily. Maretta Bees, PA Taking Active             Home Care and Equipment/Supplies: Were  Home Health Services Ordered?: NA Any new equipment or medical supplies ordered?: NA  Functional Questionnaire: Do you need assistance with bathing/showering or dressing?: Yes Do you need assistance with meal preparation?: Yes Do you need assistance with eating?: No Do you have difficulty maintaining continence: Yes Do you need assistance with getting out of bed/getting out of a chair/moving?: No Do  you have difficulty managing or taking your medications?: Yes  Follow up appointments reviewed: PCP Follow-up appointment confirmed?: No (declined appt) MD Provider Line Number:[phone] Given: No Specialist Hospital Follow-up appointment confirmed?: NA Do you need transportation to your follow-up appointment?: No Do you understand care options if your condition(s) worsen?: Yes-patient verbalized understanding    SIGNATURE Karena Addison, LPN [facility] Nurse Health Advisor Direct Dial [phone]

## 2023-10-27 ENCOUNTER — Other Ambulatory Visit: Payer: Self-pay | Admitting: Family Medicine

## 2023-10-28 ENCOUNTER — Other Ambulatory Visit: Payer: Self-pay | Admitting: Family Medicine

## 2023-11-01 MED ORDER — CIPROFLOXACIN HCL 250 MG PO TABS: ORAL_TABLET | ORAL | 1 refills | Status: DC

## 2023-11-01 NOTE — Telephone Encounter (Signed)
 Pt's son, Alvin Axe advised new prescription was sent to pharmacy.

## 2023-11-01 NOTE — Telephone Encounter (Signed)
 Copied from CRM [id number]. Topic: Clinical - Prescription Issue >> Nov 01, 2023  8:42 AM Howard Macho wrote: Reason for CRM: patient son called stating the pharmacy stated the medication-ciprofloxacin  was denied by the doctor. Patient son states they are going out of town tomorrow and need a refill Cb [phone]  Please advise if refill appropriate, rx pending.  Per OV note from 12/3 "AFTER completing 7 days of bactrim , start plain trimethoprim  IN PLACE of cipro  for prevention "

## 2023-11-22 ENCOUNTER — Ambulatory Visit (INDEPENDENT_AMBULATORY_CARE_PROVIDER_SITE_OTHER): Payer: Medicare HMO | Admitting: Family Medicine

## 2023-11-22 ENCOUNTER — Encounter: Payer: Self-pay | Admitting: Family Medicine

## 2023-11-22 VITALS — BP 118/72 | HR 97 | Temp 98.0°F | Ht 63.0 in | Wt 121.6 lb

## 2023-11-22 DIAGNOSIS — Z79899 Other long term (current) drug therapy: Secondary | ICD-10-CM

## 2023-11-22 DIAGNOSIS — E7800 Pure hypercholesterolemia, unspecified: Secondary | ICD-10-CM | POA: Diagnosis not present

## 2023-11-22 DIAGNOSIS — N1831 Chronic kidney disease, stage 3a: Secondary | ICD-10-CM

## 2023-11-22 DIAGNOSIS — N390 Urinary tract infection, site not specified: Secondary | ICD-10-CM

## 2023-11-22 DIAGNOSIS — I10 Essential (primary) hypertension: Secondary | ICD-10-CM | POA: Diagnosis not present

## 2023-11-22 DIAGNOSIS — Z23 Encounter for immunization: Secondary | ICD-10-CM

## 2023-11-22 DIAGNOSIS — D649 Anemia, unspecified: Secondary | ICD-10-CM

## 2023-11-22 LAB — LIPID PANEL
Cholesterol: 128 mg/dL (ref 0–200)
HDL: 45.5 mg/dL (ref >39.00)
LDL Cholesterol: 75 mg/dL (ref 0–99)
NonHDL: 82.55
Total CHOL/HDL Ratio: 3
Triglycerides: 36 mg/dL (ref 0.0–149.0)
VLDL: 7.2 mg/dL (ref 0.0–40.0)

## 2023-11-22 LAB — CBC WITH DIFFERENTIAL/PLATELET
Basophils Absolute: 0 10*3/uL (ref 0.0–0.1)
Basophils Relative: 1 % (ref 0.0–3.0)
Eosinophils Absolute: 0.1 10*3/uL (ref 0.0–0.7)
Eosinophils Relative: 1.8 % (ref 0.0–5.0)
HCT: 33.9 % — ABNORMAL LOW (ref 36.0–46.0)
Hemoglobin: 11.3 g/dL — ABNORMAL LOW (ref 12.0–15.0)
Lymphocytes Relative: 29.6 % (ref 12.0–46.0)
Lymphs Abs: 1.4 10*3/uL (ref 0.7–4.0)
MCHC: 33.3 g/dL (ref 30.0–36.0)
MCV: 88.8 fl (ref 78.0–100.0)
Monocytes Absolute: 0.4 10*3/uL (ref 0.1–1.0)
Monocytes Relative: 8.8 % (ref 3.0–12.0)
Neutro Abs: 2.8 10*3/uL (ref 1.4–7.7)
Neutrophils Relative %: 58.8 % (ref 43.0–77.0)
Platelets: 140 10*3/uL — ABNORMAL LOW (ref 150.0–400.0)
RBC: 3.81 Mil/uL — ABNORMAL LOW (ref 3.87–5.11)
RDW: 14.3 % (ref 11.5–15.5)
WBC: 4.8 10*3/uL (ref 4.0–10.5)

## 2023-11-22 LAB — COMPREHENSIVE METABOLIC PANEL WITH GFR
ALT: 12 U/L (ref 0–35)
AST: 18 U/L (ref 0–37)
Albumin: 4 g/dL (ref 3.5–5.2)
Alkaline Phosphatase: 54 U/L (ref 39–117)
BUN: 29 mg/dL — ABNORMAL HIGH (ref 6–23)
CO2: 30 meq/L (ref 19–32)
Calcium: 8.9 mg/dL (ref 8.4–10.5)
Chloride: 101 meq/L (ref 96–112)
Creatinine, Ser: 1.16 mg/dL (ref 0.40–1.20)
GFR: 40.44 mL/min — ABNORMAL LOW (ref >60.00)
Glucose, Bld: 81 mg/dL (ref 70–99)
Potassium: 4.9 meq/L (ref 3.5–5.1)
Sodium: 136 meq/L (ref 135–145)
Total Bilirubin: 0.7 mg/dL (ref 0.2–1.2)
Total Protein: 6.5 g/dL (ref 6.0–8.3)

## 2023-11-22 LAB — TSH: TSH: 2.41 u[IU]/mL (ref 0.35–5.50)

## 2023-11-22 NOTE — Progress Notes (Signed)
 OFFICE VISIT  11/22/2023  CC:  Chief Complaint  Patient presents with   Medical Management of Chronic Issues    Pt is not fasting    Patient is a 89 y.o. female who presents for 31-month follow-up hypertension, chronic renal insufficiency, and recurrent UTI. A/P as of last visit: #1 hypertension, well-controlled on amlodipine  10 mg a day and a irbesartan  150 mg a day. Electrolytes and creatinine monitoring today.   2.  Hypercholesterolemia, doing well on a atorvastatin  40 mg a day. Lipid panel today.   3.  Recurrent UTI, doing well status post relatively recent UTI.  She has been switched over from Cipro  to trimethoprim  for daily prophylaxis and is tolerating well.   #4 Chronic renal insufficiency stage III. She avoids NSAIDs.   Electrolytes and creatinine today.   #5 history of A-fib/flutter. She is doing well status post ablation in December 2021. Continue amiodarone to one half tab every other day.  Aspirin  and not DOAC due to high fall risk. Checking TSH today.   #6 Hx of IDA from occult GI bleed 2021: Has seen GI, elected for no interventional/procedureal eval.  Continue oral iron supplement. CBC today.  INTERIM HX: Anzlee feels well. She pulls weeds in her yard. She has good energy level and eats very well.  No acute concerns today. I confirmed that she is taking trimethoprim  as her daily prophylactic antibiotic for UTI.  This was switched from Cipro  back in December 2024.  I updated her med list today.  ROS as above, plus--> she has some lower extremity swelling in her ankles that improves significantly overnight. No fevers, no CP, no SOB, no wheezing, no cough, no dizziness, no HAs, no rashes, no melena/hematochezia.  No polyuria or polydipsia.  No myalgias or arthralgias.  No focal weakness, paresthesias, or tremors.  No acute vision or hearing abnormalities.  No dysuria or unusual/new urinary urgency or frequency. No n/v/d or abd pain.  No palpitations.     Past  Medical History:  Diagnosis Date   Arthritis    think it's osteo; got some in my hands   Atrial fibrillation (HCC) 02/10/2020   Dr. Dorothy Gates, [employer] cards-[place],ASA and BB (stopped her ARB)-in sinus-monitor planned but pt hosp for acute R CVA, eliquis  started and pt remained on rate control   Bifascicular block    CAD (coronary artery disease)    Cath 2009  Occluded LAD, 40-50% RCA and circ managed medically.  Repeat 06/22/15 no change.   Chronic constipation    slow transit.  Barium enema to eval for colonic stricture 10/2014 was NORMAL   COPD (chronic obstructive pulmonary disease) (HCC)    Changes noted on CXR 2017   CVA (cerebral vascular accident) (HCC) 03/2020   L sided weakness; a-fib-->eliquis  + rate control   Dementia (HCC)    GERD (gastroesophageal reflux disease)    (also LPR) Schatzki's ring, s/p dil '97; food impaction 08/2010, on PPI therapy since w/out further sx so no dil performed   History of herpes zoster 06/2015   right side of chest; presented with chest pain mimicking [place] --cardiac eval showed stable CAD compared to 2009.   Hyperkalemia 06/2017   suspected due to increase of losartan  from 25mg  bid to 50mg  bid.  Dose lowered back to 25 mg bid K normalized.  After increase again to 50 qAM and 25 qPM potassium 4.8 so I did not increase dose any further (07/20/17).   Hyperlipidemia    statin started in hosp for cva  03/2020   Hypertension    permissive elevated bp ok   Hyponatremia 10/03/2011   Na 131 on labs 02/2015 at [facility].  Baseline Na 132.   Iron deficiency anemia 11/30/15; 09/2019   Occult GI bleed: 09/2019 Hb drop to 8.5, iron low, hemoccult +.  Oral iron started->Dr. Buccini eval->heme neg in his office->obs/follow Hb on oral Fe; order BE if Hb not responding or having ongoing heme+ stool. Then hosp for a-fib + CVA, eliquis  started b/c no overt bleeding noted.   Mixed stress and urge urinary incontinence    RBBB with left anterior fascicular block    bifascicular  block   Recurrent UTI    Bactrim  prophyl, then changed to cefdinir  (?med rxn?).  Cipro  qd as of 08/2018.   Third nerve palsy 05/2013   presented as diplopia; felt by neuro to be microvascular insult so no carotid dopplers needed (CT and MRI neg for CVA)   Thrombocytopenia (HCC) 02/2013   Plts 114K   Uterine cancer (HCC) 2000   Xerostomia 2017/2018   Age related glandular atrophy + med effect (oxybutynin  and gabapentin ).  ANA and sjogren's panel NEG 06/2017.    Past Surgical History:  Procedure Laterality Date   ABDOMINAL HYSTERECTOMY  2000   APPENDECTOMY  2000   ATRIAL FLUTTER ABLATION  06/03/2020   CARDIAC CATHETERIZATION N/A 06/22/2015   Stable single vessel CAD, EF normal.  Procedure: Left Heart Cath and Coronary Angiography;  Surgeon: Arleen Lacer, MD;  Location: [facility] INVASIVE CV LAB;  Service: Cardiovascular;  Laterality: N/A;   CATARACT EXTRACTION W/ INTRAOCULAR LENS  IMPLANT, BILATERAL  1990's   CESAREAN SECTION  1959; 1960   COLONOSCOPY  04/2001   BE neg 10/2014   DILATION AND CURETTAGE OF UTERUS     ESOPHAGOGASTRODUODENOSCOPY  1997   for dysphagia->esoph dilatation done   HERNIA REPAIR     abdominal twice   LEFT HEART CATHETERIZATION WITH CORONARY ANGIOGRAM N/A 10/04/2011   Procedure: LEFT HEART CATHETERIZATION WITH CORONARY ANGIOGRAM;  Surgeon: Dorsey Gault, MD;  Location: [facility] CATH LAB;  Service: Cardiovascular;  Laterality: N/A;   REPAIR KNEE LIGAMENT  ~ 2008   left   Rhythm monitoring  11/02/2020   48H holter->NSR w/1st deg AV block, occ PADs, rare PVCs, frequent sinus brady, lowest HR 47, no pauses.   TONSILLECTOMY     as a child   TRANSTHORACIC ECHOCARDIOGRAM  05/2013; 02/2020; 03/24/20   02/2020 (new dx a-fib) EF 60-65%, valves ok, no wall motion abnorm. 03/2020, pt in a-fib/flutter EF 55-60%, valves ok, small peric effus.     Outpatient Medications Prior to Visit  Medication Sig Dispense Refill   amiodarone (PACERONE) 200 MG tablet Take 200 mg by mouth daily.  Take 1/2 pill daily by cardiologist     amLODipine  (NORVASC ) 10 MG tablet TAKE 1 TABLET BY MOUTH EVERY DAY 90 tablet 0   aspirin  EC 81 MG tablet Take 81 mg by mouth daily. Swallow whole.     atorvastatin  (LIPITOR) 40 MG tablet Take 1 tablet (40 mg total) by mouth daily. 30 tablet 0   cevimeline  (EVOXAC ) 30 MG capsule Take 1 capsule (30 mg total) by mouth daily. 90 capsule 3   Cholecalciferol  (VITAMIN D3) 1000 UNITS CAPS Take 1,000 Units by mouth daily.     FERROUS SULFATE  PO Take 1 tablet by mouth daily.      fluticasone  (FLONASE ) 50 MCG/ACT nasal spray SPRAY 2 SPRAYS INTO EACH NOSTRIL EVERY DAY 48 mL 0  irbesartan  (AVAPRO ) 150 MG tablet Take 1 tablet (150 mg total) by mouth daily. 90 tablet 30   Omega-3 Fatty Acids (FISH OIL) 1000 MG CAPS Take 1 capsule by mouth daily.     oxybutynin  (DITROPAN ) 5 MG tablet Take 1 tablet (5 mg total) by mouth every other day. 45 tablet 3   pantoprazole  (PROTONIX ) 40 MG tablet Take 1 tablet (40 mg total) by mouth daily. PT DUE FOR APPT MAY 90 tablet 3   polyethylene glycol powder (GLYCOLAX /MIRALAX ) powder TAKE 17 G BY MOUTH 2 (TWO) TIMES DAILY. 527 g 5   trimethoprim  (TRIMPEX ) 100 MG tablet Take 1 tablet (100 mg total) by mouth daily. 90 tablet 0   ciprofloxacin  (CIPRO ) 250 MG tablet TAKE 1 TABLET BY MOUTH EVERY DAY FOR BLADDER INFECTION PROPHYLAXIS. 90 tablet 1   nitroGLYCERIN  (NITROSTAT ) 0.4 MG SL tablet Place 1 tablet (0.4 mg total) under the tongue every 5 (five) minutes as needed for chest pain. (Patient not taking: Reported on 11/22/2023) 25 tablet 12   No facility-administered medications prior to visit.    Allergies  Allergen Reactions   Cefdinir  Hives   Macrobid [Nitrofurantoin Macrocrystal] Other (See Comments)    I had chills & fever    Review of Systems As per HPI  PE:    11/22/2023   10:06 AM 05/24/2023    8:45 AM 05/08/2023    2:05 PM  Vitals with BMI  Height 5' 3    Weight 121 lbs 10 oz 122 lbs 10 oz 123 lbs  BMI 21.55     Systolic 118 122 846  Diastolic 72 80 77  Pulse 97 70 68     Physical Exam  Gen: Alert, well appearing.  Patient is oriented to person, place, time, and situation. AFFECT: pleasant, lucid thought and speech. CV: irreg irreg, soft systolic murmur, no diastolic murmur.  No rub. Chest is clear, no wheezing or rales. Normal symmetric air entry throughout both lung fields. No chest wall deformities or tenderness. EXT: 2 + bilat ankle ptting edema.  LABS:  Last CBC Lab Results  Component Value Date   WBC 5.6 05/24/2023   HGB 10.7 (L) 05/24/2023   HCT 32.8 (L) 05/24/2023   MCV 92.6 05/24/2023   MCH 30.7 01/05/2021   RDW 15.0 05/24/2023   PLT 139.0 (L) 05/24/2023   Lab Results  Component Value Date   IRON 63 04/13/2022   TIBC 200 (L) 04/13/2022   FERRITIN 124 04/13/2022   Last metabolic panel Lab Results  Component Value Date   GLUCOSE 90 05/24/2023   NA 135 05/24/2023   K 4.5 05/24/2023   CL 104 05/24/2023   CO2 23 05/24/2023   BUN 31 (H) 05/24/2023   CREATININE 1.43 (H) 05/24/2023   GFR 31.57 (L) 05/24/2023   CALCIUM  8.6 05/24/2023   PROT 6.6 11/29/2022   ALBUMIN 4.1 11/29/2022   BILITOT 0.8 11/29/2022   ALKPHOS 51 11/29/2022   AST 18 11/29/2022   ALT 10 11/29/2022   ANIONGAP 12 03/26/2020   Last lipids Lab Results  Component Value Date   CHOL 126 05/24/2023   HDL 44.00 05/24/2023   LDLCALC 70 05/24/2023   TRIG 59.0 05/24/2023   CHOLHDL 3 05/24/2023   Last hemoglobin A1c Lab Results  Component Value Date   HGBA1C 5.6 03/24/2020   Last thyroid  functions Lab Results  Component Value Date   TSH 3.24 05/24/2023   T3TOTAL 78.4 (L) 05/10/2015   Last vitamin B12 and Folate Lab Results  Component Value Date   VITAMINB12 680 02/24/2016   IMPRESSION AND PLAN:  #1 hypertension, well-controlled on amlodipine  10 mg a day and a irbesartan  150 mg a day. Electrolytes and creatinine monitoring today.   2.  Hypercholesterolemia, doing well on a atorvastatin   40 mg a day. Lipid panel and hepatic panel today.   3.  Recurrent UTI, doing well status post relatively recent UTI.  She has been switched over from Cipro  to trimethoprim  (05/2023) for daily prophylaxis and is tolerating well.   #4 Chronic renal stage III. She avoids NSAIDs.   Electrolytes and creatinine today.   #5 history of A-fib/flutter. She had ablation 2021. Sounds like she's in a-fib at this time, rate controlled.  Asymptomatic. Continue amiodarone 200 mg, one half tab every other day.  Aspirin  and not DOAC due to high fall risk. Checking TSH today.   #6 Hx of IDA from occult GI bleed 2021: Has seen GI, elected for no interventional/procedureal eval.  Continue oral iron supplement. CBC today.  An After Visit Summary was printed and given to the patient.  FOLLOW UP: Return in about 6 months (around 05/23/2024).  Signed:  Arletha Lady, MD           11/22/2023

## 2023-11-26 ENCOUNTER — Ambulatory Visit: Payer: Self-pay | Admitting: Family Medicine

## 2023-11-28 NOTE — Telephone Encounter (Signed)
 Communication  Reason for CRM: Patient son, called regarding missed call, relay results, understood and had no further questions   No further action needed at this time.

## 2023-12-23 ENCOUNTER — Other Ambulatory Visit: Payer: Self-pay | Admitting: Family Medicine

## 2023-12-24 ENCOUNTER — Other Ambulatory Visit: Payer: Self-pay | Admitting: Family Medicine

## 2023-12-24 NOTE — Telephone Encounter (Signed)
 FYI Only or Action Required?: Action required by provider: medication refill request.  Patient was last seen in primary care on 11/22/2023 by McGowen, Aleene DEL, MD.  Called Nurse Triage reporting No chief complaint on file..  Symptoms began today.  Interventions attempted: Nothing.  Symptoms are: stable.  Triage Disposition: No disposition on file.  Patient/caregiver understands and will follow disposition?:

## 2023-12-24 NOTE — Telephone Encounter (Signed)
 Copied from CRM [id number]. Topic: Clinical - Medication Refill >> Dec 24, 2023 10:46 AM Aleatha C wrote: Medication:  Pantoprazole  Sodium   Has the patient contacted their pharmacy? Yes (Agent: If no, request that the patient contact the pharmacy for the refill. If patient does not wish to contact the pharmacy document the reason why and proceed with request.) (Agent: If yes, when and what did the pharmacy advise?)  This is the patient's preferred pharmacy:  [id number] - [place], [place] - [address] [place] [address] Phone: [phone] Fax: [phone]  Is this the correct pharmacy for this prescription? Yes If no, delete pharmacy and type the correct one.   Has the prescription been filled recently? No  Is the patient out of the medication? No  Has the patient been seen for an appointment in the last year OR does the patient have an upcoming appointment? Yes  Can we respond through MyChart? No  Agent: Please be advised that Rx refills may take up to 3 business days. We ask that you follow-up with your pharmacy.

## 2024-01-25 ENCOUNTER — Other Ambulatory Visit: Payer: Self-pay | Admitting: Family Medicine

## 2024-02-09 DIAGNOSIS — S5292XA Unspecified fracture of left forearm, initial encounter for closed fracture: Secondary | ICD-10-CM | POA: Diagnosis not present

## 2024-02-12 DIAGNOSIS — M25532 Pain in left wrist: Secondary | ICD-10-CM | POA: Diagnosis not present

## 2024-02-12 DIAGNOSIS — S52552A Other extraarticular fracture of lower end of left radius, initial encounter for closed fracture: Secondary | ICD-10-CM | POA: Diagnosis not present

## 2024-02-25 ENCOUNTER — Encounter: Payer: Self-pay | Admitting: Family Medicine

## 2024-02-25 ENCOUNTER — Ambulatory Visit (INDEPENDENT_AMBULATORY_CARE_PROVIDER_SITE_OTHER): Admitting: Family Medicine

## 2024-02-25 VITALS — BP 95/63 | HR 97 | Temp 98.1°F | Ht 63.0 in | Wt 122.8 lb

## 2024-02-25 DIAGNOSIS — H6123 Impacted cerumen, bilateral: Secondary | ICD-10-CM | POA: Diagnosis not present

## 2024-02-25 DIAGNOSIS — Z23 Encounter for immunization: Secondary | ICD-10-CM

## 2024-02-25 NOTE — Progress Notes (Signed)
 OFFICE VISIT  02/25/2024  CC: No chief complaint on file.   Patient is a 89 y.o. female who presents accompanied by a caregiver Sabrina Alvarez) for ears feel muffled.  HPI: Ears feel blocked. No pain or drainage.  She does wear hearing aids in both ears.  Past Medical History:  Diagnosis Date   Arthritis    think it's osteo; got some in my hands   Atrial fibrillation (HCC) 02/10/2020   Dr. Veneta, [employer] cards-[place],ASA and BB (stopped her ARB)-in sinus-monitor planned but pt hosp for acute R CVA, eliquis  started and pt remained on rate control   Bifascicular block    CAD (coronary artery disease)    Cath 2009  Occluded LAD, 40-50% RCA and circ managed medically.  Repeat 06/22/15 no change.   Chronic constipation    slow transit.  Barium enema to eval for colonic stricture 10/2014 was NORMAL   COPD (chronic obstructive pulmonary disease) (HCC)    Changes noted on CXR 2017   CVA (cerebral vascular accident) (HCC) 03/2020   L sided weakness; a-fib-->eliquis  + rate control   Dementia (HCC)    GERD (gastroesophageal reflux disease)    (also LPR) Schatzki's ring, s/p dil '97; food impaction 08/2010, on PPI therapy since w/out further sx so no dil performed   History of herpes zoster 06/2015   right side of chest; presented with chest pain mimicking [place] --cardiac eval showed stable CAD compared to 2009.   Hyperkalemia 06/2017   suspected due to increase of losartan  from 25mg  bid to 50mg  bid.  Dose lowered back to 25 mg bid K normalized.  After increase again to 50 qAM and 25 qPM potassium 4.8 so I did not increase dose any further (07/20/17).   Hyperlipidemia    statin started in hosp for cva 03/2020   Hypertension    permissive elevated bp ok   Hyponatremia 10/03/2011   Na 131 on labs 02/2015 at [facility].  Baseline Na 132.   Iron deficiency anemia 11/30/15; 09/2019   Occult GI bleed: 09/2019 Hb drop to 8.5, iron low, hemoccult +.  Oral iron started->Dr. Buccini eval->heme neg in his  office->obs/follow Hb on oral Fe; order BE if Hb not responding or having ongoing heme+ stool. Then hosp for a-fib + CVA, eliquis  started b/c no overt bleeding noted.   Mixed stress and urge urinary incontinence    RBBB with left anterior fascicular block    bifascicular block   Recurrent UTI    Bactrim  prophyl, then changed to cefdinir  (?med rxn?).  Cipro  qd as of 08/2018.   Third nerve palsy 05/2013   presented as diplopia; felt by neuro to be microvascular insult so no carotid dopplers needed (CT and MRI neg for CVA)   Thrombocytopenia (HCC) 02/2013   Plts 114K   Uterine cancer (HCC) 2000   Xerostomia 2017/2018   Age related glandular atrophy + med effect (oxybutynin  and gabapentin ).  ANA and sjogren's panel NEG 06/2017.    Past Surgical History:  Procedure Laterality Date   ABDOMINAL HYSTERECTOMY  2000   APPENDECTOMY  2000   ATRIAL FLUTTER ABLATION  06/03/2020   CARDIAC CATHETERIZATION N/A 06/22/2015   Stable single vessel CAD, EF normal.  Procedure: Left Heart Cath and Coronary Angiography;  Surgeon: Alm LELON Clay, MD;  Location: [facility] INVASIVE CV LAB;  Service: Cardiovascular;  Laterality: N/A;   CATARACT EXTRACTION W/ INTRAOCULAR LENS  IMPLANT, BILATERAL  1990's   CESAREAN SECTION  1959; 1960   COLONOSCOPY  04/2001  BE neg 10/2014   DILATION AND CURETTAGE OF UTERUS     ESOPHAGOGASTRODUODENOSCOPY  1997   for dysphagia->esoph dilatation done   HERNIA REPAIR     abdominal twice   LEFT HEART CATHETERIZATION WITH CORONARY ANGIOGRAM N/A 10/04/2011   Procedure: LEFT HEART CATHETERIZATION WITH CORONARY ANGIOGRAM;  Surgeon: Elsie GORMAN Somerset, MD;  Location: [facility] CATH LAB;  Service: Cardiovascular;  Laterality: N/A;   REPAIR KNEE LIGAMENT  ~ 2008   left   Rhythm monitoring  11/02/2020   48H holter->NSR w/1st deg AV block, occ PADs, rare PVCs, frequent sinus brady, lowest HR 47, no pauses.   TONSILLECTOMY     as a child   TRANSTHORACIC ECHOCARDIOGRAM  05/2013; 02/2020; 03/24/20   02/2020  (new dx a-fib) EF 60-65%, valves ok, no wall motion abnorm. 03/2020, pt in a-fib/flutter EF 55-60%, valves ok, small peric effus.     Outpatient Medications Prior to Visit  Medication Sig Dispense Refill   amiodarone (PACERONE) 200 MG tablet Take 200 mg by mouth daily. Take 1/2 pill daily by cardiologist     amLODipine  (NORVASC ) 10 MG tablet TAKE 1 TABLET BY MOUTH EVERY DAY 90 tablet 0   aspirin  EC 81 MG tablet Take 81 mg by mouth daily. Swallow whole.     atorvastatin  (LIPITOR) 40 MG tablet Take 1 tablet (40 mg total) by mouth daily. 30 tablet 0   cevimeline  (EVOXAC ) 30 MG capsule Take 1 capsule (30 mg total) by mouth daily. 90 capsule 3   Cholecalciferol  (VITAMIN D3) 1000 UNITS CAPS Take 1,000 Units by mouth daily.     FERROUS SULFATE  PO Take 1 tablet by mouth daily.      fluticasone  (FLONASE ) 50 MCG/ACT nasal spray SPRAY 2 SPRAYS INTO EACH NOSTRIL EVERY DAY 48 mL 0   irbesartan  (AVAPRO ) 150 MG tablet Take 1 tablet (150 mg total) by mouth daily. 90 tablet 30   nitroGLYCERIN  (NITROSTAT ) 0.4 MG SL tablet Place 1 tablet (0.4 mg total) under the tongue every 5 (five) minutes as needed for chest pain. (Patient not taking: Reported on 11/22/2023) 25 tablet 12   Omega-3 Fatty Acids (FISH OIL) 1000 MG CAPS Take 1 capsule by mouth daily.     oxybutynin  (DITROPAN ) 5 MG tablet Take 1 tablet (5 mg total) by mouth every other day. 45 tablet 3   pantoprazole  (PROTONIX ) 40 MG tablet TAKE 1 TABLET (40 MG TOTAL) BY MOUTH DAILY. PT DUE FOR APPT MAY 90 tablet 1   polyethylene glycol powder (GLYCOLAX /MIRALAX ) powder TAKE 17 G BY MOUTH 2 (TWO) TIMES DAILY. 527 g 5   trimethoprim  (TRIMPEX ) 100 MG tablet Take 1 tablet (100 mg total) by mouth daily. 90 tablet 0   No facility-administered medications prior to visit.    Allergies  Allergen Reactions   Cefdinir  Hives   Macrobid [Nitrofurantoin Macrocrystal] Other (See Comments)    I had chills & fever    Review of Systems  As per HPI  PE:    11/22/2023    10:06 AM 05/24/2023    8:45 AM 05/08/2023    2:05 PM  Vitals with BMI  Height 5' 3    Weight 121 lbs 10 oz 122 lbs 10 oz 123 lbs  BMI 21.55    Systolic 118 122 879  Diastolic 72 80 77  Pulse 97 70 68     Physical Exam  General: Alert and well-appearing. Both external auditory canals are 95% full of cerumen. I cleared each canal with a lighted curette and  all of the cerumen was removed. Tympanic membranes and canal epithelium normal.  LABS:  Last CBC Lab Results  Component Value Date   WBC 4.8 11/22/2023   HGB 11.3 (L) 11/22/2023   HCT 33.9 (L) 11/22/2023   MCV 88.8 11/22/2023   MCH 30.7 01/05/2021   RDW 14.3 11/22/2023   PLT 140.0 (L) 11/22/2023   Last metabolic panel Lab Results  Component Value Date   GLUCOSE 81 11/22/2023   NA 136 11/22/2023   K 4.9 11/22/2023   CL 101 11/22/2023   CO2 30 11/22/2023   BUN 29 (H) 11/22/2023   CREATININE 1.16 11/22/2023   GFR 40.44 (L) 11/22/2023   CALCIUM  8.9 11/22/2023   PROT 6.5 11/22/2023   ALBUMIN 4.0 11/22/2023   BILITOT 0.7 11/22/2023   ALKPHOS 54 11/22/2023   AST 18 11/22/2023   ALT 12 11/22/2023   ANIONGAP 12 03/26/2020   Last lipids Lab Results  Component Value Date   CHOL 128 11/22/2023   HDL 45.50 11/22/2023   LDLCALC 75 11/22/2023   TRIG 36.0 11/22/2023   CHOLHDL 3 11/22/2023   Last hemoglobin A1c Lab Results  Component Value Date   HGBA1C 5.6 03/24/2020   Last thyroid  functions Lab Results  Component Value Date   TSH 2.41 11/22/2023   T3TOTAL 78.4 (L) 05/10/2015   Last vitamin B12 and Folate Lab Results  Component Value Date   VITAMINB12 680 02/24/2016   IMPRESSION AND PLAN:  Bilateral cerumen impaction. Cleared with curette today.  Tympanic membranes and canal epithelium appear normal.  An After Visit Summary was printed and given to the patient.  FOLLOW UP: No follow-ups on file.  Signed:  Gerlene Hockey, MD           02/25/2024

## 2024-02-26 DIAGNOSIS — S52552A Other extraarticular fracture of lower end of left radius, initial encounter for closed fracture: Secondary | ICD-10-CM | POA: Diagnosis not present

## 2024-03-11 DIAGNOSIS — S52552A Other extraarticular fracture of lower end of left radius, initial encounter for closed fracture: Secondary | ICD-10-CM | POA: Diagnosis not present

## 2024-04-04 ENCOUNTER — Other Ambulatory Visit: Payer: Self-pay | Admitting: Family Medicine

## 2024-04-08 DIAGNOSIS — S52552A Other extraarticular fracture of lower end of left radius, initial encounter for closed fracture: Secondary | ICD-10-CM | POA: Diagnosis not present

## 2024-04-25 ENCOUNTER — Other Ambulatory Visit: Payer: Self-pay | Admitting: Family Medicine

## 2024-05-23 ENCOUNTER — Ambulatory Visit: Admitting: Family Medicine

## 2024-05-23 ENCOUNTER — Encounter: Payer: Self-pay | Admitting: Family Medicine

## 2024-05-23 VITALS — BP 123/81 | HR 63 | Temp 98.0°F | Ht 63.0 in | Wt 118.4 lb

## 2024-05-23 DIAGNOSIS — Z7901 Long term (current) use of anticoagulants: Secondary | ICD-10-CM | POA: Diagnosis not present

## 2024-05-23 DIAGNOSIS — Z79899 Other long term (current) drug therapy: Secondary | ICD-10-CM | POA: Diagnosis not present

## 2024-05-23 DIAGNOSIS — I10 Essential (primary) hypertension: Secondary | ICD-10-CM | POA: Diagnosis not present

## 2024-05-23 DIAGNOSIS — N390 Urinary tract infection, site not specified: Secondary | ICD-10-CM

## 2024-05-23 DIAGNOSIS — D500 Iron deficiency anemia secondary to blood loss (chronic): Secondary | ICD-10-CM | POA: Diagnosis not present

## 2024-05-23 MED ORDER — AMLODIPINE BESYLATE 10 MG PO TABS: 10.0000 mg | ORAL_TABLET | Freq: Every day | ORAL | 3 refills | Status: AC

## 2024-05-23 MED ORDER — IRBESARTAN 150 MG PO TABS: 150.0000 mg | ORAL_TABLET | Freq: Every day | ORAL | 3 refills | Status: AC

## 2024-05-23 NOTE — Progress Notes (Signed)
 OFFICE VISIT  05/23/2024  CC:  Chief Complaint  Patient presents with   Medical Management of Chronic Issues    Patient is a 89 y.o. female who presents accompanied by a caregiver for 2-month follow-up hypertension, chronic renal insufficiency, and recurrent UTI. #1 hypertension, well-controlled on amlodipine  10 mg a day and a irbesartan  150 mg a day. Electrolytes and creatinine monitoring today.   2.  Hypercholesterolemia, doing well on a atorvastatin  40 mg a day. Lipid panel and hepatic panel today.   3.  Recurrent UTI, doing well status post relatively recent UTI.  She has been switched over from Cipro  to trimethoprim  (05/2023) for daily prophylaxis and is tolerating well.   #4 Chronic renal stage III. She avoids NSAIDs.   Electrolytes and creatinine today.   #5 history of A-fib/flutter. She had ablation 2021. Sounds like she's in a-fib at this time, rate controlled.  Asymptomatic. Continue amiodarone 200 mg, one half tab every other day.  Aspirin  and not DOAC due to high fall risk. Checking TSH today.   #6 Hx of IDA from occult GI bleed 2021: Has seen GI, elected for no interventional/procedureal eval.  Continue oral iron supplement. CBC today.  INTERIM HX: Overall she is doing very well.  She is having gradual progression of memory impairment.  Severe hearing impairment complicates this. She has someone with her 24/7.  No acute concerns.   Past Medical History:  Diagnosis Date   Arthritis    think it's osteo; got some in my hands   Atrial fibrillation (HCC) 02/10/2020   Dr. Veneta, [employer] [facility]-[place],ASA and BB (stopped her ARB)-in sinus-monitor planned but pt hosp for acute R CVA, eliquis  started and pt remained on rate control   Bifascicular block    CAD (coronary artery disease)    Cath 2009  Occluded LAD, 40-50% RCA and circ managed medically.  Repeat 06/22/15 no change.   Chronic constipation    slow transit.  Barium enema to eval for colonic stricture  10/2014 was NORMAL   COPD (chronic obstructive pulmonary disease) (HCC)    Changes noted on CXR 2017   CVA (cerebral vascular accident) (HCC) 03/2020   L sided weakness; a-fib-->eliquis  + rate control   Dementia (HCC)    GERD (gastroesophageal reflux disease)    (also LPR) Schatzki's ring, s/p dil '97; food impaction 08/2010, on PPI therapy since w/out further sx so no dil performed   History of herpes zoster 06/2015   right side of chest; presented with chest pain mimicking [place] --cardiac eval showed stable CAD compared to 2009.   Hyperkalemia 06/2017   suspected due to increase of losartan  from 25mg  bid to 50mg  bid.  Dose lowered back to 25 mg bid K normalized.  After increase again to 50 qAM and 25 qPM potassium 4.8 so I did not increase dose any further (07/20/17).   Hyperlipidemia    statin started in hosp for cva 03/2020   Hypertension    permissive elevated bp ok   Hyponatremia 10/03/2011   Na 131 on labs 02/2015 at [facility].  Baseline Na 132.   Iron deficiency anemia 11/30/15; 09/2019   Occult GI bleed: 09/2019 Hb drop to 8.5, iron low, hemoccult +.  Oral iron started->Dr. Buccini eval->heme neg in his office->obs/follow Hb on oral Fe; order BE if Hb not responding or having ongoing heme+ stool. Then hosp for a-fib + CVA, eliquis  started b/c no overt bleeding noted.   Mixed stress and urge urinary incontinence    RBBB  with left anterior fascicular block    bifascicular block   Recurrent UTI    Bactrim  prophyl, then changed to cefdinir  (?med rxn?).  Cipro  qd as of 08/2018.   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  ANA and sjogren's panel NEG 06/2017.    Past Surgical History:  Procedure Laterality Date   ABDOMINAL  HYSTERECTOMY  2000   APPENDECTOMY  2000   ATRIAL FLUTTER ABLATION  06/03/2020   CARDIAC CATHETERIZATION N/A 06/22/2015   Stable single vessel CAD, EF normal.  Procedure: Left Heart Cath and Coronary Angiography;  Surgeon: Alm LELON Clay, MD;  Location: [facility] INVASIVE CV LAB;  Service: Cardiovascular;  Laterality: N/A;   CATARACT EXTRACTION W/ INTRAOCULAR LENS  IMPLANT, BILATERAL  1990's   CESAREAN SECTION  1959; 1960   COLONOSCOPY  04/2001   BE neg 10/2014   DILATION AND CURETTAGE OF UTERUS     ESOPHAGOGASTRODUODENOSCOPY  1997   for dysphagia->esoph dilatation done   HERNIA REPAIR     abdominal twice   LEFT HEART CATHETERIZATION WITH CORONARY ANGIOGRAM N/A 10/04/2011   Procedure: LEFT HEART CATHETERIZATION WITH CORONARY ANGIOGRAM;  Surgeon: Elsie GORMAN Somerset, MD;  Location: [facility] CATH LAB;  Service: Cardiovascular;  Laterality: N/A;   REPAIR KNEE LIGAMENT  ~ 2008   left   Rhythm monitoring  11/02/2020   48H holter->NSR w/1st deg AV block, occ PADs, rare PVCs, frequent sinus brady, lowest HR 47, no pauses.   TONSILLECTOMY     as a child   TRANSTHORACIC ECHOCARDIOGRAM  05/2013; 02/2020; 03/24/20   02/2020 (new dx a-fib) EF 60-65%, valves ok, no wall motion abnorm. 03/2020, pt in a-fib/flutter EF 55-60%, valves ok, small peric effus.     Outpatient Medications Prior to Visit  Medication Sig Dispense Refill   amiodarone (PACERONE) 200 MG tablet Take 200 mg by mouth daily. Take 1/2 pill daily by cardiologist     aspirin  EC 81 MG tablet Take 81 mg by mouth daily. Swallow whole.     atorvastatin  (LIPITOR) 40 MG tablet Take 1 tablet (40 mg total) by mouth daily. 30 tablet 0   cevimeline  (EVOXAC ) 30 MG capsule TAKE 1 CAPSULE 3 TIMES A DAY FOR TREATMENT OF CHRONIC DRY MOUTH 270 capsule 1   Cholecalciferol  (VITAMIN D3) 1000 UNITS CAPS Take 1,000 Units by mouth daily.     FERROUS SULFATE  PO Take 1 tablet by mouth daily.      fluticasone  (FLONASE ) 50 MCG/ACT nasal spray SPRAY 2 SPRAYS INTO EACH NOSTRIL  EVERY DAY 48 mL 0   nitroGLYCERIN  (NITROSTAT ) 0.4 MG SL tablet Place 1 tablet (0.4 mg total) under the tongue every 5 (five) minutes as needed for chest pain. 25 tablet 12   Omega-3 Fatty Acids (FISH OIL) 1000 MG CAPS Take 1 capsule by mouth daily.     oxybutynin  (DITROPAN ) 5 MG tablet Take 1 tablet (5 mg total) by mouth every other day. 45 tablet 3   pantoprazole  (PROTONIX ) 40 MG tablet TAKE 1 TABLET (40 MG TOTAL) BY MOUTH DAILY. PT DUE FOR APPT MAY 90 tablet 1   polyethylene glycol powder (GLYCOLAX /MIRALAX ) powder TAKE 17 G BY MOUTH 2 (TWO) TIMES DAILY. 527 g 5   trimethoprim  (TRIMPEX )  100 MG tablet Take 1 tablet (100 mg total) by mouth daily. 90 tablet 0   ciprofloxacin  (CIPRO ) 250 MG tablet TAKE 1 TABLET BY MOUTH EVERY DAY FOR BLADDER INFECTION PROPHYLAXIS. 30 tablet 0   amLODipine  (NORVASC ) 10 MG tablet TAKE 1 TABLET BY MOUTH EVERY DAY 30 tablet 0   irbesartan  (AVAPRO ) 150 MG tablet Take 1 tablet (150 mg total) by mouth daily. 90 tablet 30   No facility-administered medications prior to visit.    Allergies[1]  Review of Systems As per HPI  PE:    05/23/2024   10:42 AM 02/25/2024    1:31 PM 11/22/2023   10:06 AM  Vitals with BMI  Height 5' 3 5' 3 5' 3  Weight 118 lbs 6 oz 122 lbs 13 oz 121 lbs 10 oz  BMI 20.98 21.76 21.55  Systolic 123 95 118  Diastolic 81 63 72  Pulse 63 97 97     Physical Exam  Gen: Alert, well appearing.  Patient is oriented to person, place, time, and situation. AFFECT: pleasant, lucid thought and speech. Cardiovascular: Irregularly irregular, rate approximately 95, no murmur. Lungs are clear bilaterally, breathing nonlabored. Extremities show 2+ pitting edema on the right lower leg and trace pitting edema on the left lower leg.  This is her baseline.  LABS:  Last CBC Lab Results  Component Value Date   WBC 4.8 11/22/2023   HGB 11.3 (L) 11/22/2023   HCT 33.9 (L) 11/22/2023   MCV 88.8 11/22/2023   MCH 30.7 01/05/2021   RDW 14.3 11/22/2023    PLT 140.0 (L) 11/22/2023   Lab Results  Component Value Date   IRON 63 04/13/2022   TIBC 200 (L) 04/13/2022   FERRITIN 124 04/13/2022   Last metabolic panel Lab Results  Component Value Date   GLUCOSE 81 11/22/2023   NA 136 11/22/2023   K 4.9 11/22/2023   CL 101 11/22/2023   CO2 30 11/22/2023   BUN 29 (H) 11/22/2023   CREATININE 1.16 11/22/2023   GFR 40.44 (L) 11/22/2023   CALCIUM  8.9 11/22/2023   PROT 6.5 11/22/2023   ALBUMIN 4.0 11/22/2023   BILITOT 0.7 11/22/2023   ALKPHOS 54 11/22/2023   AST 18 11/22/2023   ALT 12 11/22/2023   ANIONGAP 12 03/26/2020   Last lipids Lab Results  Component Value Date   CHOL 128 11/22/2023   HDL 45.50 11/22/2023   LDLCALC 75 11/22/2023   TRIG 36.0 11/22/2023   CHOLHDL 3 11/22/2023   Last hemoglobin A1c Lab Results  Component Value Date   HGBA1C 5.6 03/24/2020   Last thyroid  functions Lab Results  Component Value Date   TSH 2.41 11/22/2023   T3TOTAL 78.4 (L) 05/10/2015   FREET4 1.00 06/10/2015   Last vitamin B12 and Folate Lab Results  Component Value Date   VITAMINB12 680 02/24/2016   IMPRESSION AND PLAN:  #1 hypertension, well-controlled on amlodipine  10 mg a day and a irbesartan  150 mg a day. Electrolytes and creatinine monitoring today.   2.  Hypercholesterolemia, doing well on a atorvastatin  40 mg a day. Lipid panel and hepatic panel today.   3.  Recurrent UTI, doing well status post relatively recent UTI.  She has been switched over from Cipro  to trimethoprim  (05/2023) for daily prophylaxis and is tolerating well.   #4 Chronic renal stage III. She avoids NSAIDs.   Electrolytes and creatinine today.   #5 history of A-fib/flutter. She had ablation 2021. Sounds like she's in a-fib at this time, rate controlled.  Asymptomatic. Continue amiodarone 200 mg, one half tab every other day.  Aspirin  and not DOAC due to high fall risk. Checking TSH today.   #6 Hx of IDA from occult GI bleed 2021: Has seen GI,  elected for no interventional/procedureal eval.  Continue oral iron supplement. CBC today.  An After Visit Summary was printed and given to the patient.  FOLLOW UP: Return in about 6 months (around 11/21/2024) for routine chronic illness f/u.  Signed:  Gerlene Hockey, MD           05/23/2024      [1]  Allergies Allergen Reactions   Cefdinir  Hives   Macrobid [Nitrofurantoin Macrocrystal] Other (See Comments)    I had chills & fever

## 2024-05-24 LAB — BASIC METABOLIC PANEL WITH GFR
BUN/Creatinine Ratio: 33 (calc) — ABNORMAL HIGH (ref 6–22)
BUN: 33 mg/dL — ABNORMAL HIGH (ref 7–25)
CO2: 26 mmol/L (ref 20–32)
Calcium: 8.8 mg/dL (ref 8.6–10.4)
Chloride: 103 mmol/L (ref 98–110)
Creat: 1 mg/dL — ABNORMAL HIGH (ref 0.60–0.95)
Glucose, Bld: 102 mg/dL — ABNORMAL HIGH (ref 65–99)
Potassium: 4.7 mmol/L (ref 3.5–5.3)
Sodium: 137 mmol/L (ref 135–146)
eGFR: 52 mL/min/1.73m2 — ABNORMAL LOW

## 2024-05-24 LAB — CBC WITH DIFFERENTIAL/PLATELET
Absolute Lymphocytes: 1165 {cells}/uL (ref 850–3900)
Absolute Monocytes: 455 {cells}/uL (ref 200–950)
Basophils Absolute: 40 {cells}/uL (ref 0–200)
Basophils Relative: 0.8 %
Eosinophils Absolute: 90 {cells}/uL (ref 15–500)
Eosinophils Relative: 1.8 %
HCT: 33.4 % — ABNORMAL LOW (ref 35.9–46.0)
Hemoglobin: 11.1 g/dL — ABNORMAL LOW (ref 11.7–15.5)
MCH: 30.4 pg (ref 27.0–33.0)
MCHC: 33.2 g/dL (ref 31.6–35.4)
MCV: 91.5 fL (ref 81.4–101.7)
MPV: 11.4 fL (ref 7.5–12.5)
Monocytes Relative: 9.1 %
Neutro Abs: 3250 {cells}/uL (ref 1500–7800)
Neutrophils Relative %: 65 %
Platelets: 156 Thousand/uL (ref 140–400)
RBC: 3.65 Million/uL — ABNORMAL LOW (ref 3.80–5.10)
RDW: 12.5 % (ref 11.0–15.0)
Total Lymphocyte: 23.3 %
WBC: 5 Thousand/uL (ref 3.8–10.8)

## 2024-05-24 LAB — TSH: TSH: 1.61 m[IU]/L (ref 0.40–4.50)

## 2024-05-26 ENCOUNTER — Ambulatory Visit: Payer: Self-pay | Admitting: Family Medicine

## 2024-05-29 ENCOUNTER — Other Ambulatory Visit: Payer: Self-pay | Admitting: Family Medicine

## 2024-06-14 ENCOUNTER — Other Ambulatory Visit: Payer: Self-pay | Admitting: Family Medicine

## 2024-06-15 ENCOUNTER — Other Ambulatory Visit: Payer: Self-pay | Admitting: Family Medicine

## 2024-06-27 ENCOUNTER — Other Ambulatory Visit: Payer: Self-pay | Admitting: Family Medicine

## 2024-06-27 ENCOUNTER — Telehealth: Payer: Self-pay

## 2024-06-27 NOTE — Telephone Encounter (Signed)
 Communication  Medication: ciprofloxacin  (CIPRO ) 250 MG tablet        Has the patient contacted their pharmacy? Yes    (Agent: If no, request that the patient contact the pharmacy for the refill. If patient does not wish to contact the pharmacy document the reason why and proceed with request.)    (Agent: If yes, when and what did the pharmacy advise?)        This is the patient's preferred pharmacy:    [id number] - [place], [place] - [address] [place] [address]    [address] [place] [address]    [place] [address]    Phone: [phone] Fax: [phone]    Tried calling patient, unable to LVM. Pt was switched from Ciprofloxacin  to Trimpex  as of 05/2023 and tolerating well.

## 2024-07-04 NOTE — Telephone Encounter (Unsigned)
 Copied from CRM #[id number]. Topic: Clinical - Prescription Issue >> Jul 04, 2024 10:37 AM Robinson H wrote: Reason for CRM: Patients son following up on refill request for the ciprofloxacin  (CIPRO ) 250 MG tablet, states patient isn't having any issues but part of her daily medication that she's been taking, not sure if patient should still be taking it.  Joey [phone]

## 2024-07-04 NOTE — Telephone Encounter (Signed)
 LVM for pt's son Magdalene to return call to discuss.  Pt was switched from Ciprofloxacin  to Trimpex  as of 05/2023 and tolerating well.

## 2024-07-09 ENCOUNTER — Ambulatory Visit: Payer: Medicare HMO

## 2024-08-06 ENCOUNTER — Ambulatory Visit

## 2024-11-21 ENCOUNTER — Ambulatory Visit: Admitting: Family Medicine
